# Patient Record
Sex: Female | Born: 1950
Health system: Southern US, Community
[De-identification: ages and names within clinical notes are randomized; demographics above are authoritative.]

## PROBLEM LIST (undated history)

## (undated) DIAGNOSIS — N189 Chronic kidney disease, unspecified: Secondary | ICD-10-CM

## (undated) DIAGNOSIS — E049 Nontoxic goiter, unspecified: Secondary | ICD-10-CM

## (undated) DIAGNOSIS — C50919 Malignant neoplasm of unspecified site of unspecified female breast: Secondary | ICD-10-CM

## (undated) DIAGNOSIS — I480 Paroxysmal atrial fibrillation: Secondary | ICD-10-CM

## (undated) DIAGNOSIS — R0602 Shortness of breath: Secondary | ICD-10-CM

## (undated) DIAGNOSIS — I428 Other cardiomyopathies: Secondary | ICD-10-CM

## (undated) DIAGNOSIS — I10 Essential (primary) hypertension: Secondary | ICD-10-CM

## (undated) DIAGNOSIS — I5022 Chronic systolic (congestive) heart failure: Secondary | ICD-10-CM

## (undated) DIAGNOSIS — F32A Depression, unspecified: Secondary | ICD-10-CM

## (undated) DIAGNOSIS — R55 Syncope and collapse: Secondary | ICD-10-CM

## (undated) DIAGNOSIS — F419 Anxiety disorder, unspecified: Secondary | ICD-10-CM

## (undated) DIAGNOSIS — Z95810 Presence of automatic (implantable) cardiac defibrillator: Secondary | ICD-10-CM

## (undated) DIAGNOSIS — I447 Left bundle-branch block, unspecified: Secondary | ICD-10-CM

## (undated) DIAGNOSIS — I427 Cardiomyopathy due to drug and external agent: Secondary | ICD-10-CM

## (undated) DIAGNOSIS — T451X5A Adverse effect of antineoplastic and immunosuppressive drugs, initial encounter: Secondary | ICD-10-CM

## (undated) DIAGNOSIS — Z9889 Other specified postprocedural states: Secondary | ICD-10-CM

## (undated) DIAGNOSIS — I509 Heart failure, unspecified: Secondary | ICD-10-CM

## (undated) DIAGNOSIS — I2510 Atherosclerotic heart disease of native coronary artery without angina pectoris: Secondary | ICD-10-CM

## (undated) DIAGNOSIS — M1990 Unspecified osteoarthritis, unspecified site: Secondary | ICD-10-CM

## (undated) DIAGNOSIS — I209 Angina pectoris, unspecified: Secondary | ICD-10-CM

## (undated) DIAGNOSIS — R112 Nausea with vomiting, unspecified: Secondary | ICD-10-CM

## (undated) DIAGNOSIS — Z950 Presence of cardiac pacemaker: Secondary | ICD-10-CM

## (undated) DIAGNOSIS — E119 Type 2 diabetes mellitus without complications: Secondary | ICD-10-CM

## (undated) DIAGNOSIS — I4891 Unspecified atrial fibrillation: Secondary | ICD-10-CM

## (undated) DIAGNOSIS — E785 Hyperlipidemia, unspecified: Secondary | ICD-10-CM

## (undated) DIAGNOSIS — F329 Major depressive disorder, single episode, unspecified: Secondary | ICD-10-CM

## (undated) HISTORY — DX: Syncope and collapse: R55

## (undated) HISTORY — PX: CARDIAC DEFIBRILLATOR PLACEMENT: SHX171

## (undated) HISTORY — DX: Cardiomyopathy due to drug and external agent: I42.7

## (undated) HISTORY — DX: Malignant neoplasm of unspecified site of unspecified female breast: C50.919

## (undated) HISTORY — DX: Chronic systolic (congestive) heart failure: I50.22

## (undated) HISTORY — PX: MASTECTOMY: SHX3

## (undated) HISTORY — DX: Other cardiomyopathies: I42.8

## (undated) HISTORY — PX: BREAST SURGERY: SHX581

## (undated) HISTORY — DX: Adverse effect of antineoplastic and immunosuppressive drugs, initial encounter: T45.1X5A

## (undated) HISTORY — PX: TUNNELED VENOUS CATHETER PLACEMENT: SHX818

## (undated) HISTORY — DX: Left bundle-branch block, unspecified: I44.7

---
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
## 1973-07-11 HISTORY — PX: DILATION AND CURETTAGE OF UTERUS: SHX78

## 1989-07-11 HISTORY — PX: PORTACATH PLACEMENT: SHX2246

## 1992-07-11 HISTORY — PX: FOOT SURGERY: SHX648

## 1997-12-01 ENCOUNTER — Ambulatory Visit (HOSPITAL_COMMUNITY): Admission: RE | Admit: 1997-12-01 | Discharge: 1997-12-01 | Payer: Self-pay | Admitting: Oncology

## 1998-11-24 ENCOUNTER — Ambulatory Visit (HOSPITAL_COMMUNITY): Admission: RE | Admit: 1998-11-24 | Discharge: 1998-11-24 | Payer: Self-pay | Admitting: Oncology

## 2000-04-14 ENCOUNTER — Other Ambulatory Visit: Admission: RE | Admit: 2000-04-14 | Discharge: 2000-04-14 | Payer: Self-pay | Admitting: Internal Medicine

## 2000-06-15 ENCOUNTER — Encounter (HOSPITAL_COMMUNITY): Payer: Self-pay | Admitting: Oncology

## 2000-06-15 ENCOUNTER — Encounter: Admission: RE | Admit: 2000-06-15 | Discharge: 2000-06-15 | Payer: Self-pay | Admitting: Oncology

## 2001-12-10 ENCOUNTER — Ambulatory Visit (HOSPITAL_COMMUNITY): Admission: RE | Admit: 2001-12-10 | Discharge: 2001-12-10 | Payer: Self-pay | Admitting: Oncology

## 2001-12-10 ENCOUNTER — Encounter (HOSPITAL_COMMUNITY): Payer: Self-pay | Admitting: Oncology

## 2002-02-25 ENCOUNTER — Other Ambulatory Visit: Admission: RE | Admit: 2002-02-25 | Discharge: 2002-02-25 | Payer: Self-pay | Admitting: Internal Medicine

## 2004-02-13 ENCOUNTER — Encounter: Admission: RE | Admit: 2004-02-13 | Discharge: 2004-02-13 | Payer: Self-pay | Admitting: Cardiology

## 2004-12-02 ENCOUNTER — Ambulatory Visit: Payer: Self-pay | Admitting: Oncology

## 2005-06-28 ENCOUNTER — Other Ambulatory Visit: Admission: RE | Admit: 2005-06-28 | Discharge: 2005-06-28 | Payer: Self-pay | Admitting: Internal Medicine

## 2005-11-09 ENCOUNTER — Encounter (HOSPITAL_COMMUNITY): Payer: Self-pay | Admitting: Oncology

## 2005-11-29 ENCOUNTER — Ambulatory Visit: Payer: Self-pay | Admitting: Oncology

## 2005-12-29 ENCOUNTER — Encounter: Admission: RE | Admit: 2005-12-29 | Discharge: 2005-12-29 | Payer: Self-pay | Admitting: Oncology

## 2006-08-21 ENCOUNTER — Encounter: Admission: RE | Admit: 2006-08-21 | Discharge: 2006-08-21 | Payer: Self-pay | Admitting: Cardiology

## 2006-11-24 ENCOUNTER — Ambulatory Visit: Payer: Self-pay | Admitting: Oncology

## 2006-11-28 LAB — LACTATE DEHYDROGENASE: LDH: 203 U/L (ref 94–250)

## 2006-11-28 LAB — COMPREHENSIVE METABOLIC PANEL
AST: 39 U/L — ABNORMAL HIGH (ref 0–37)
Alkaline Phosphatase: 65 U/L (ref 39–117)
BUN: 27 mg/dL — ABNORMAL HIGH (ref 6–23)
Chloride: 97 mEq/L (ref 96–112)
Glucose, Bld: 171 mg/dL — ABNORMAL HIGH (ref 70–99)
Sodium: 142 mEq/L (ref 135–145)
Total Bilirubin: 0.7 mg/dL (ref 0.3–1.2)

## 2006-11-28 LAB — CBC WITH DIFFERENTIAL/PLATELET
BASO%: 0.3 % (ref 0.0–2.0)
EOS%: 2.7 % (ref 0.0–7.0)
HGB: 12.6 g/dL (ref 11.6–15.9)
MCV: 92.9 fL (ref 81.0–101.0)
MONO#: 0.5 10*3/uL (ref 0.1–0.9)
NEUT%: 72.1 % (ref 39.6–76.8)

## 2006-11-28 LAB — HEMOGLOBIN A1C: Hgb A1c MFr Bld: 9.2 % — ABNORMAL HIGH (ref 4.6–6.1)

## 2007-01-11 ENCOUNTER — Encounter: Admission: RE | Admit: 2007-01-11 | Discharge: 2007-01-11 | Payer: Self-pay | Admitting: Oncology

## 2007-11-23 ENCOUNTER — Ambulatory Visit: Payer: Self-pay | Admitting: Oncology

## 2007-12-31 ENCOUNTER — Ambulatory Visit: Payer: Self-pay | Admitting: Internal Medicine

## 2008-01-30 ENCOUNTER — Encounter: Admission: RE | Admit: 2008-01-30 | Discharge: 2008-01-30 | Payer: Self-pay | Admitting: Oncology

## 2008-11-25 ENCOUNTER — Ambulatory Visit: Payer: Self-pay | Admitting: Oncology

## 2010-02-04 ENCOUNTER — Encounter: Admission: RE | Admit: 2010-02-04 | Discharge: 2010-02-04 | Payer: Self-pay | Admitting: Internal Medicine

## 2010-02-16 ENCOUNTER — Encounter: Admission: RE | Admit: 2010-02-16 | Discharge: 2010-02-16 | Payer: Self-pay | Admitting: Internal Medicine

## 2010-02-16 ENCOUNTER — Other Ambulatory Visit: Admission: RE | Admit: 2010-02-16 | Discharge: 2010-02-16 | Payer: Self-pay | Admitting: Interventional Radiology

## 2010-08-02 ENCOUNTER — Encounter: Payer: Self-pay | Admitting: Orthopaedic Surgery

## 2010-08-02 ENCOUNTER — Encounter: Payer: Self-pay | Admitting: Internal Medicine

## 2010-11-12 ENCOUNTER — Encounter: Payer: Self-pay | Admitting: Internal Medicine

## 2010-11-23 NOTE — Letter (Signed)
December 31, 2007    Sara Walker. Sara Walker, M.D.  [address], [place] [address]   RE:  Sara, Walker  MRN:  [id number]  /  DOB:  1950/10/17   Dear Sara Walker,   Thank you for referring [name] over for consideration for  prophylactic ICD implantation.  As you know she is a very pleasant 60-  year-old morbidly obese woman with a longstanding dilated cardiomyopathy  secondary to complications from chemotherapy.  The patient has had LV  dysfunction and an EF of 10-20% for many many years.  She also has left  bundle-branch block.  She has class III congestive heart failure, though  with careful adjustments in her medications has managed not to be  hospitalized in the recent past.  She has never had frank syncope,  although she does note that she has fallen twice unexpectedly for no  reason in the last month.  She denies frank syncope.  She denies  palpitations.  She denies sensation that her heart was racing prior to  her episodes of falling.  She denies for medical or dietary  noncompliance.  She does note some chronic swelling in her lower  extremities.   CURRENT MEDICATIONS:  1. Aldactone 50-100 mg daily.  2. Coreg CR 80 mg a day.  3. Torsemide half tablet daily.  4. Diclofenac twice daily.  5. Crestor 5 mg every other day.  6. Lantus insulin.  7. Multivitamin.   ADDITIONAL PAST MEDICAL HISTORY:  Notable for mastectomy and C-section  in 1976 and 1983.  She has a history of Port-A-Cath placed in 1999 with  removal in 1994.  She has a history of problems with arthritis in her  feet and ankles and is status post surgery on her foot and ankle in the  past.  She has longstanding diabetes.  She has obesity.   FAMILY HISTORY:  Notable for mother still alive at age 59 with dementia  and father who is deceased at 18 with a myocardial infarction.  She has  a brother who is living, who is 37.   SOCIAL HISTORY:  The patient lives with her mother and cares for her.  She  denies tobacco use.  Starts smoking in 1989.  She does not drink  alcoholic beverages or use recreational drugs.   REVIEW OF SYSTEMS:  Notable for generalized fatigue and weakness.  Her  heart failure symptoms are class III.  She has a history of asthma and  seasonal allergic rhinitis.  She has a history of chronic anemia.  She  has a history of gout, which was exacerbated by HCTZ.  She has a history  of cellulitis in her right arm in the past.  She has history of  dysmenorrhea.  The rest of her review of systems was negative.   PHYSICAL EXAMINATION:  GENERAL:  She is pleasant, obese, middle-aged  woman, in no acute distress.  VITAL SIGNS:  The blood pressure was 121/74, the pulse was 75 and  regular, respirations were 18, and weight was 298 pounds.  HEENT:  Normocephalic and atraumatic.  Pupils were equal and round.  Oropharynx was moist.  Sclerae anicteric.  NECK:  Revealed 7-8 cm jugular distention.  No thyromegaly.  Trachea was  midline.  Carotids were 2+ and symmetric.  LUNGS:  Clear bilaterally to auscultation.  No wheezes, rales, or  rhonchi were present.  There were no increased work of breathing.  CARDIAC:  Regular rate and rhythm with  normal S1 and S2.  The PMI was  enlarged and laterally displaced.  ABDOMEN:  Massively obese, nontender, and nondistended.  There was no  organomegaly.  EXTREMITIES:  Trace peripheral edema bilaterally also morbidly obese.  NEUROLOGIC:  Alert and oriented x3.  Her cranial nerves intact.  Strength was 5/5 and symmetric.   EKG demonstrates sinus rhythm with left bundle-branch block.   Sara Walker, the patient has several problems including dilated cardiomyopathy  with congestive heart failure and left bundle-branch block and morbid  obesity.  I have discussed treatment options  with the patient.  The risks, benefits, goals, and expectations of ICD  implantation have been discussed with her.  I have recommended  proceeding with defibrillator implant.   She is considering her options  and she will call us if she would like to proceed with this and the  earliest possible convenient time.  Thanks again for referring [name] for EP evaluation.    Sincerely,      Champ Mungo. Lovena Le, MD  Electronically Signed    GWT/MedQ  DD: 12/31/2007  DT: 01/01/2008  Job #: [id number]

## 2011-02-28 ENCOUNTER — Ambulatory Visit
Admission: RE | Admit: 2011-02-28 | Discharge: 2011-02-28 | Disposition: A | Discharge status: Home / Self Care | Payer: Medicare Other | Source: Ambulatory Visit | Attending: Internal Medicine | Admitting: Internal Medicine

## 2011-02-28 ENCOUNTER — Other Ambulatory Visit: Payer: Self-pay | Admitting: Internal Medicine

## 2011-02-28 DIAGNOSIS — E049 Nontoxic goiter, unspecified: Secondary | ICD-10-CM

## 2011-05-09 ENCOUNTER — Encounter: Payer: Self-pay | Admitting: Internal Medicine

## 2011-07-20 ENCOUNTER — Other Ambulatory Visit: Payer: Self-pay | Admitting: Endocrinology

## 2011-07-20 DIAGNOSIS — E049 Nontoxic goiter, unspecified: Secondary | ICD-10-CM

## 2011-07-22 ENCOUNTER — Other Ambulatory Visit: Payer: Self-pay

## 2011-07-22 ENCOUNTER — Telehealth: Payer: Self-pay

## 2011-07-22 DIAGNOSIS — C50919 Malignant neoplasm of unspecified site of unspecified female breast: Secondary | ICD-10-CM

## 2011-07-22 NOTE — Telephone Encounter (Signed)
Pt states this appt is for checkup b/c her PCP asked when was the last time she saw an oncologist. No immediate concerns voiced. I told her the schedulers would be calling her with an appt probably in March. She voiced understanding.

## 2011-07-25 ENCOUNTER — Other Ambulatory Visit: Payer: Medicare Other

## 2011-08-09 DIAGNOSIS — M109 Gout, unspecified: Secondary | ICD-10-CM | POA: Diagnosis not present

## 2011-09-12 DIAGNOSIS — L600 Ingrowing nail: Secondary | ICD-10-CM | POA: Diagnosis not present

## 2011-09-12 DIAGNOSIS — M2040 Other hammer toe(s) (acquired), unspecified foot: Secondary | ICD-10-CM | POA: Diagnosis not present

## 2011-09-12 DIAGNOSIS — M2010 Hallux valgus (acquired), unspecified foot: Secondary | ICD-10-CM | POA: Diagnosis not present

## 2011-09-12 DIAGNOSIS — B351 Tinea unguium: Secondary | ICD-10-CM | POA: Diagnosis not present

## 2011-09-12 DIAGNOSIS — E1169 Type 2 diabetes mellitus with other specified complication: Secondary | ICD-10-CM | POA: Diagnosis not present

## 2011-09-22 DIAGNOSIS — E7800 Pure hypercholesterolemia, unspecified: Secondary | ICD-10-CM | POA: Diagnosis not present

## 2011-09-22 DIAGNOSIS — E119 Type 2 diabetes mellitus without complications: Secondary | ICD-10-CM | POA: Diagnosis not present

## 2011-10-11 ENCOUNTER — Encounter: Payer: Self-pay | Admitting: Internal Medicine

## 2011-10-11 ENCOUNTER — Encounter (HOSPITAL_COMMUNITY): Payer: Self-pay | Admitting: Internal Medicine

## 2011-10-11 ENCOUNTER — Inpatient Hospital Stay (HOSPITAL_COMMUNITY)
Admission: AD | Admit: 2011-10-11 | Discharge: 2011-10-14 | DRG: 292 | Disposition: A | Discharge status: Home / Self Care | Payer: Medicare Other | Source: Ambulatory Visit | Attending: Internal Medicine | Admitting: Internal Medicine

## 2011-10-11 DIAGNOSIS — Z79899 Other long term (current) drug therapy: Secondary | ICD-10-CM

## 2011-10-11 DIAGNOSIS — E119 Type 2 diabetes mellitus without complications: Secondary | ICD-10-CM | POA: Diagnosis present

## 2011-10-11 DIAGNOSIS — Z853 Personal history of malignant neoplasm of breast: Secondary | ICD-10-CM | POA: Diagnosis not present

## 2011-10-11 DIAGNOSIS — E1169 Type 2 diabetes mellitus with other specified complication: Secondary | ICD-10-CM | POA: Diagnosis present

## 2011-10-11 DIAGNOSIS — R609 Edema, unspecified: Secondary | ICD-10-CM | POA: Diagnosis not present

## 2011-10-11 DIAGNOSIS — I5022 Chronic systolic (congestive) heart failure: Secondary | ICD-10-CM | POA: Diagnosis not present

## 2011-10-11 DIAGNOSIS — I10 Essential (primary) hypertension: Secondary | ICD-10-CM | POA: Diagnosis not present

## 2011-10-11 DIAGNOSIS — Z6831 Body mass index (BMI) 31.0-31.9, adult: Secondary | ICD-10-CM

## 2011-10-11 DIAGNOSIS — M109 Gout, unspecified: Secondary | ICD-10-CM | POA: Insufficient documentation

## 2011-10-11 DIAGNOSIS — I428 Other cardiomyopathies: Secondary | ICD-10-CM | POA: Diagnosis not present

## 2011-10-11 DIAGNOSIS — T451X5A Adverse effect of antineoplastic and immunosuppressive drugs, initial encounter: Secondary | ICD-10-CM | POA: Diagnosis present

## 2011-10-11 DIAGNOSIS — I2589 Other forms of chronic ischemic heart disease: Secondary | ICD-10-CM | POA: Diagnosis not present

## 2011-10-11 DIAGNOSIS — E785 Hyperlipidemia, unspecified: Secondary | ICD-10-CM | POA: Diagnosis present

## 2011-10-11 DIAGNOSIS — E049 Nontoxic goiter, unspecified: Secondary | ICD-10-CM | POA: Insufficient documentation

## 2011-10-11 DIAGNOSIS — I509 Heart failure, unspecified: Secondary | ICD-10-CM | POA: Diagnosis present

## 2011-10-11 DIAGNOSIS — I5023 Acute on chronic systolic (congestive) heart failure: Principal | ICD-10-CM | POA: Diagnosis present

## 2011-10-11 DIAGNOSIS — C50919 Malignant neoplasm of unspecified site of unspecified female breast: Secondary | ICD-10-CM | POA: Insufficient documentation

## 2011-10-11 DIAGNOSIS — R0602 Shortness of breath: Secondary | ICD-10-CM | POA: Diagnosis not present

## 2011-10-11 DIAGNOSIS — E782 Mixed hyperlipidemia: Secondary | ICD-10-CM | POA: Diagnosis not present

## 2011-10-11 DIAGNOSIS — I447 Left bundle-branch block, unspecified: Secondary | ICD-10-CM | POA: Diagnosis present

## 2011-10-11 DIAGNOSIS — E876 Hypokalemia: Secondary | ICD-10-CM | POA: Diagnosis present

## 2011-10-11 DIAGNOSIS — E7800 Pure hypercholesterolemia, unspecified: Secondary | ICD-10-CM | POA: Diagnosis present

## 2011-10-11 DIAGNOSIS — E669 Obesity, unspecified: Secondary | ICD-10-CM | POA: Diagnosis present

## 2011-10-11 HISTORY — DX: Malignant neoplasm of unspecified site of unspecified female breast: C50.919

## 2011-10-11 HISTORY — DX: Type 2 diabetes mellitus without complications: E11.9

## 2011-10-11 HISTORY — DX: Nontoxic goiter, unspecified: E04.9

## 2011-10-11 HISTORY — DX: Hyperlipidemia, unspecified: E78.5

## 2011-10-11 HISTORY — DX: Essential (primary) hypertension: I10

## 2011-10-11 LAB — DIFFERENTIAL
Basophils Absolute: 0 10*3/uL (ref 0.0–0.1)
Basophils Relative: 0 % (ref 0–1)
Eosinophils Absolute: 0.1 10*3/uL (ref 0.0–0.7)
Monocytes Relative: 8 % (ref 3–12)
Neutrophils Relative %: 69 % (ref 43–77)

## 2011-10-11 LAB — CBC
Hemoglobin: 13.6 g/dL (ref 12.0–15.0)
MCH: 30.2 pg (ref 26.0–34.0)
MCHC: 33.1 g/dL (ref 30.0–36.0)
RDW: 15 % (ref 11.5–15.5)

## 2011-10-11 LAB — COMPREHENSIVE METABOLIC PANEL
Albumin: 3.9 g/dL (ref 3.5–5.2)
Alkaline Phosphatase: 103 U/L (ref 39–117)
BUN: 20 mg/dL (ref 6–23)
Creatinine, Ser: 0.97 mg/dL (ref 0.50–1.10)
Potassium: 3.8 mEq/L (ref 3.5–5.1)
Total Protein: 6.4 g/dL (ref 6.0–8.3)

## 2011-10-11 LAB — PROTIME-INR
INR: 1.26 (ref 0.00–1.49)
Prothrombin Time: 16.1 seconds — ABNORMAL HIGH (ref 11.6–15.2)

## 2011-10-11 LAB — GLUCOSE, CAPILLARY

## 2011-10-11 LAB — TSH: TSH: 3.192 u[IU]/mL (ref 0.350–4.500)

## 2011-10-11 LAB — MAGNESIUM: Magnesium: 2.1 mg/dL (ref 1.5–2.5)

## 2011-10-11 MED ORDER — SODIUM CHLORIDE 0.9 % IJ SOLN
3.0000 mL | Freq: Two times a day (BID) | INTRAMUSCULAR | Status: DC
  Administered 2011-10-12 – 2011-10-14 (×4): 3 mL via INTRAVENOUS

## 2011-10-11 MED ORDER — CARVEDILOL PHOSPHATE ER 40 MG PO CP24
40.0000 mg | ORAL_CAPSULE | Freq: Every day | ORAL | Status: DC
  Administered 2011-10-12 – 2011-10-14 (×3): 40 mg via ORAL
  Filled 2011-10-11 (×3): qty 1

## 2011-10-11 MED ORDER — ONDANSETRON HCL 4 MG/2ML IJ SOLN: 4.0000 mg | Freq: Four times a day (QID) | INTRAMUSCULAR | Status: DC | PRN

## 2011-10-11 MED ORDER — SODIUM CHLORIDE 0.9 % IV SOLN: 250.0000 mL | INTRAVENOUS | Status: DC | PRN

## 2011-10-11 MED ORDER — SODIUM CHLORIDE 0.9 % IJ SOLN: 3.0000 mL | INTRAMUSCULAR | Status: DC | PRN

## 2011-10-11 MED ORDER — HEPARIN SODIUM (PORCINE) 5000 UNIT/ML IJ SOLN
5000.0000 [IU] | Freq: Three times a day (TID) | INTRAMUSCULAR | Status: DC
  Administered 2011-10-11 – 2011-10-12 (×3): 5000 [IU] via SUBCUTANEOUS
  Filled 2011-10-11 (×12): qty 1

## 2011-10-11 MED ORDER — ADULT MULTIVITAMIN W/MINERALS CH
1.0000 | ORAL_TABLET | Freq: Every day | ORAL | Status: DC
  Administered 2011-10-12 – 2011-10-14 (×3): 1 via ORAL
  Filled 2011-10-11 (×3): qty 1

## 2011-10-11 MED ORDER — ZOLPIDEM TARTRATE 5 MG PO TABS: 10.0000 mg | ORAL_TABLET | Freq: Every evening | ORAL | Status: DC | PRN

## 2011-10-11 MED ORDER — ACETAMINOPHEN 325 MG PO TABS
650.0000 mg | ORAL_TABLET | ORAL | Status: DC | PRN
  Filled 2011-10-11: qty 2

## 2011-10-11 MED ORDER — PANTOPRAZOLE SODIUM 40 MG PO TBEC
40.0000 mg | DELAYED_RELEASE_TABLET | Freq: Every day | ORAL | Status: DC
  Administered 2011-10-12 – 2011-10-14 (×2): 40 mg via ORAL
  Filled 2011-10-11 (×2): qty 1

## 2011-10-11 MED ORDER — FUROSEMIDE 10 MG/ML IJ SOLN
60.0000 mg | Freq: Two times a day (BID) | INTRAMUSCULAR | Status: DC
  Filled 2011-10-11 (×2): qty 6

## 2011-10-11 MED ORDER — ALLOPURINOL 100 MG PO TABS
100.0000 mg | ORAL_TABLET | Freq: Two times a day (BID) | ORAL | Status: DC
  Administered 2011-10-12 – 2011-10-14 (×5): 100 mg via ORAL
  Filled 2011-10-11 (×7): qty 1

## 2011-10-11 MED ORDER — BUMETANIDE 0.25 MG/ML IJ SOLN
1.0000 mg | Freq: Two times a day (BID) | INTRAMUSCULAR | Status: DC
  Administered 2011-10-11 – 2011-10-12 (×2): 1 mg via INTRAVENOUS
  Filled 2011-10-11 (×3): qty 4

## 2011-10-11 MED ORDER — SPIRONOLACTONE 25 MG PO TABS
25.0000 mg | ORAL_TABLET | Freq: Every day | ORAL | Status: DC
  Administered 2011-10-12 – 2011-10-14 (×3): 25 mg via ORAL
  Filled 2011-10-11 (×3): qty 1

## 2011-10-11 MED ORDER — ALPRAZOLAM 0.25 MG PO TABS: 0.2500 mg | ORAL_TABLET | Freq: Three times a day (TID) | ORAL | Status: DC | PRN

## 2011-10-11 MED ORDER — THERA M PLUS PO TABS: 1.0000 | ORAL_TABLET | Freq: Every day | ORAL | Status: DC

## 2011-10-11 NOTE — Progress Notes (Signed)
Consent obtained for PICC line. Able to obtain PIV. Patient asks to wait until AM after MD rounds to have PICC placed. Primary RN aware. No further actions taken at this time. Edson Snowball RN [facility]

## 2011-10-11 NOTE — H&P (Signed)
[facility]    ADMISSION HISTORY & PHYSICAL   Chief Complaint:  Shortness of breath and weight gain  HPI:  This is a 61 y.o. female with a past medical history significant for invasive ductal breast cancer s/p adriamycin chemotherapy with cardiomyopathy and EF 25%.  She also has LBBB and a wide QRS of 170 msec. She has not been interested in Bi-V AICD in the past. She also has a history of morbid obesity with a weight of over 400 lbs.  - she started a vegan diet and recently weighed 165 lbs. In the office. She was seen today by Elliot Gault, PharmD at Dr. Pennie Banter office, who was concerned about her weight gain and referred her to [place]. In the office, she now weighs 214 lbs, appears extremely dyspneic with minimal exertion. Room air oxygen saturation is 88%. She cannot lie flat and has significant abdominal protuberance and lower extremity edema. I have recommended admission from the office for probable biventricular heart failure.   PMHx:  Past Medical History  Diagnosis Date  . Goiter   . DM2 (diabetes mellitus, type 2)   . Congestive heart failure (CHF)   . Invasive ductal carcinoma of breast   . Gout   . Hyperlipidemia   . Hypertension     No past surgical history on file.  FAMHx:  Family History  Problem Relation Age of Onset  . Heart attack Father   . Hypertension Brother     SOCHx:   reports that she has never smoked. She has never used smokeless tobacco. She reports that she does not drink alcohol or use illicit drugs.  ALLERGIES:  Allergies not on file  ROS: A comprehensive review of systems was negative except for: Constitutional: positive for anorexia, fatigue and weight gain Respiratory: positive for cough, dyspnea on exertion and wheezing Cardiovascular: positive for dyspnea, orthopnea and paroxysmal nocturnal dyspnea Genitourinary: positive for none Integument/breast: positive for warm, dry skin Musculoskeletal: positive for muscle  weakness Endocrine: positive for diabetes  HOME MEDS: No current facility-administered medications on file as of .   Medications Prior to Admission  Medication Sig Dispense Refill  . allopurinol (ZYLOPRIM) 100 MG tablet Take 100 mg by mouth daily.      . carvedilol (COREG CR) 40 MG 24 hr capsule Take 40 mg by mouth daily.      Marland Kitchen spironolactone (ALDACTONE) 25 MG tablet Take 25 mg by mouth daily.      Marland Kitchen torsemide (DEMADEX) 100 MG tablet Take 50 mg by mouth daily.        LABS/IMAGING: No results found for this or any previous visit (from the past 48 hour(s)). No results found.  VITALS: Office vitals: Weight 214 lbs (up from 165), BP 110/72, P 70, BMI 33, SPO2 88% on RA  EXAM: General appearance: alert and mild distress Neck: JVD - 4 cm above sternal notch, no adenopathy, no carotid bruit, supple, symmetrical, trachea midline and thyroid not enlarged, symmetric, no tenderness/mass/nodules Lungs: diminished breath sounds bilaterally and rales bibasilar Heart: S1, S2 normal, S3 present and systolic murmur: early systolic 2/6, crescendo at 2nd left intercostal space Abdomen: protuberant, mild fluid wave Extremities: 2-3+ pitting LE edema Pulses: 1+ symmetric Skin: dry, warm Neurologic: Mental status: Alert, oriented, thought content appropriate  IMPRESSION: 1. Probably biventricular heart failure.   PLAN: 1. Significant abdominal girth, weight gain, LE edema. Severe cardiomyopathy with ventricular dyssynchrony.  Will need diuresis +/- inotropes. Would be reasonable to start  IV diuresis today and consider right heart catheterization tomorrow to assess cardiac output and filling pressures. Last echo was in 2011 - will need to repeat this.   Pixie Casino, MD, [facility] Attending Cardiologist [facility] 10/11/2011, 3:44 PM

## 2011-10-11 NOTE — Progress Notes (Signed)
DIRECT ADMISSION FROM MD'S OFFICE FOR CHF, POOR VEIN  AND LIMITED VEIN ACCESS MD AWARE WITH ORDER FOR PICC LINE, IV TEAM AWARE.

## 2011-10-12 ENCOUNTER — Encounter: Payer: Self-pay | Admitting: Internal Medicine

## 2011-10-12 ENCOUNTER — Inpatient Hospital Stay (HOSPITAL_COMMUNITY): Payer: Medicare Other

## 2011-10-12 DIAGNOSIS — R0602 Shortness of breath: Secondary | ICD-10-CM | POA: Diagnosis not present

## 2011-10-12 DIAGNOSIS — I428 Other cardiomyopathies: Secondary | ICD-10-CM | POA: Diagnosis not present

## 2011-10-12 DIAGNOSIS — I5023 Acute on chronic systolic (congestive) heart failure: Secondary | ICD-10-CM | POA: Diagnosis not present

## 2011-10-12 DIAGNOSIS — E119 Type 2 diabetes mellitus without complications: Secondary | ICD-10-CM | POA: Diagnosis not present

## 2011-10-12 DIAGNOSIS — I2589 Other forms of chronic ischemic heart disease: Secondary | ICD-10-CM | POA: Diagnosis not present

## 2011-10-12 DIAGNOSIS — I509 Heart failure, unspecified: Secondary | ICD-10-CM | POA: Diagnosis not present

## 2011-10-12 LAB — BASIC METABOLIC PANEL
Calcium: 9.1 mg/dL (ref 8.4–10.5)
Creatinine, Ser: 0.85 mg/dL (ref 0.50–1.10)
GFR calc Af Amer: 85 mL/min — ABNORMAL LOW (ref >90)
GFR calc non Af Amer: 73 mL/min — ABNORMAL LOW (ref >90)

## 2011-10-12 LAB — GLUCOSE, CAPILLARY: Glucose-Capillary: 115 mg/dL — ABNORMAL HIGH (ref 70–99)

## 2011-10-12 MED ORDER — POTASSIUM CHLORIDE CRYS ER 20 MEQ PO TBCR
40.0000 meq | EXTENDED_RELEASE_TABLET | Freq: Once | ORAL | Status: AC
  Administered 2011-10-12: 40 meq via ORAL
  Filled 2011-10-12: qty 2

## 2011-10-12 MED ORDER — BUMETANIDE 0.25 MG/ML IJ SOLN
1.0000 mg | INTRAMUSCULAR | Status: DC
  Administered 2011-10-12: 1 mg via INTRAVENOUS
  Filled 2011-10-12 (×3): qty 4

## 2011-10-12 NOTE — Clinical Documentation Improvement (Signed)
CHF DOCUMENTATION CLARIFICATION QUERY  THIS DOCUMENT IS NOT A PERMANENT PART OF THE MEDICAL RECORD  TO RESPOND TO THE THIS QUERY, FOLLOW THE INSTRUCTIONS BELOW:  1. If needed, update documentation for the patient's encounter via the notes activity.  2. Access this query again and click edit on the [facility] [employer].  3. After updating, or not, click F2 to complete all highlighted (required) fields concerning your review. Select "additional documentation in the medical record" OR "no additional documentation provided".  4. Click Sign note button.  5. The deficiency will fall out of your In Basket *Please let us know if you are not able to complete this workflow by phone or e-mail (listed below).  Please update your documentation within the medical record to reflect your response to this query.                                                                                    10/12/11  Dear Dr[facility],  In a better effort to capture your patient's severity of illness, reflect appropriate length of stay and utilization of resources, a review of the patient medical record has revealed the following indicators the diagnosis of Heart Failure.    Based on your clinical judgment, please clarify and document in a progress note and/or discharge summary the clinical condition associated with the following supporting information:  In responding to this query please exercise your independent judgment.  The fact that a query is asked, does not imply that any particular answer is desired or expected.  Possible Clinical Conditions?  Acute Systolic Congestive Heart Failure Acute Diastolic Congestive Heart Failure Acute Systolic & Diastolic Congestive Heart Failure Acute on Chronic Systolic Congestive Heart Failure Acute on Chronic Diastolic Congestive Heart Failure Acute on Chronic Systolic & Diastolic  Congestive Heart Failure Other Condition Cannot Clinically Determine  Supporting  Information:  Risk Factors: Noted probable biventricular heart failure per 10/12/11 progress notes. Please specify acute vs chronic in the progress notes and discharge summary.  Diagnostics: BNP results pending  Treatment: 4/02:  Bumex 1mg  IV q 12 hours 4/02:  Lasix 60mg  IV @ 2226 4/03:  Spironolactone 25mg  qday   Reviewed: additional documentation in the medical record  Thank You,  Theron Arista,  Clinical Documentation Specialist:  [phone]:   [place]

## 2011-10-12 NOTE — Progress Notes (Signed)
*  PRELIMINARY RESULTS* Echocardiogram 2D Echocardiogram has been performed.  Roxine Caddy [facility] 10/12/2011, 12:01 PM

## 2011-10-12 NOTE — Progress Notes (Signed)
This is a 61 y.o. female with a past medical history significant for invasive ductal breast cancer s/p adriamycin chemotherapy with cardiomyopathy and EF 25%. She also has LBBB and a wide QRS of 170 msec. She has not been interested in Bi-V AICD in the past. She also has a history of morbid obesity with a weight of over 400 lbs. - she started a vegan diet and recently weighed 165 lbs. In the office. She was seen today by Elliot Gault, PharmD at Dr. Pennie Banter office, who was concerned about her weight gain and referred her to [place]. In the office, she now weighs 214 lbs, appears extremely dyspneic with minimal exertion. Room air oxygen saturation is 88%. She cannot lie flat and has significant abdominal protuberance and lower extremity edema. I have recommended admission from the office for probable biventricular heart failure.     Subjective: No chest pain.  Breathing is better, edema still remains.  Objective: Vital signs in last 24 hours: Temp:  [97.3 F (36.3 C)-98.3 F (36.8 C)] 97.8 F (36.6 C) (04/03 0732) Pulse Rate:  [68-82] 69  (04/03 0732) Resp:  [15-21] 15  (04/03 0732) BP: (99-117)/(53-78) 103/72 mmHg (04/03 0732) SpO2:  [95 %-98 %] 97 % (04/03 0732) Weight:  [94 kg (207 lb 3.7 oz)] 94 kg (207 lb 3.7 oz) (04/02 1648) Weight change:  Last BM Date: 10/11/11 Intake/Output from previous day: -2780 04/02 0701 - 04/03 0700 In: 320 [P.O.:320] Out: 3900 [Urine:3900] Intake/Output this shift:    PE: General:alert and oriented. Feels dizzy because she hasn't eaten yet. Neck:while sitting up no JVD [id number] RRR, 2/6 systolic murmur Lungs:decreased breath sounds, no wheezes [id number], + BS, soft, non tender Ext:2-3+ Edema    Lab Results:  Basename 10/11/11 1655  WBC 6.3  HGB 13.6  HCT 41.1  PLT 195   BMET  Basename 10/12/11 0534 10/11/11 1655  NA 140 132*  K 3.4* 3.8  CL 101 96  CO2 27 22  GLUCOSE 113* 133*  BUN 18 20  CREATININE 0.85 0.97  CALCIUM 9.1 9.5   No  results found for this basename: TROPONINI:2,CK,MB:2 in the last 72 hours  No results found for this basename: CHOL, HDL, LDLCALC, LDLDIRECT, TRIG, CHOLHDL   Lab Results  Component Value Date   HGBA1C 9.2* 11/28/2006     Lab Results  Component Value Date   TSH 3.192 10/11/2011    Hepatic Function Panel  Basename 10/11/11 1655  PROT 6.4  ALBUMIN 3.9  AST 16  ALT 11  ALKPHOS 103  BILITOT 0.9  BILIDIR --  IBILI --   No results found for this basename: CHOL in the last 72 hours No results found for this basename: PROTIME in the last 72 hours    EKG: Orders placed during the hospital encounter of 10/11/11  . EKG 12-LEAD  . EKG 12-LEAD    Studies/Results: Dg Chest Port 1 View  10/12/2011  *RADIOLOGY REPORT*  Clinical Data: Shortness of breath, CHF  PORTABLE CHEST - 1 VIEW  Comparison: 09/29/2009; 12/10/2001; thyroid ultrasound - 01/15/2010; ultrasound guided thyroid biopsy - 02/16/2010  Findings:  Grossly unchanged enlarged cardiac silhouette. Increased leftward deviation of the tracheal air column with interval increase in right paratracheal mass.  Improved aeration of the bilateral upper lungs.  Pulmonary vasculature is more distinct on the present examination.  No definite pleural effusion or pneumothorax.  Right axillary surgical clips.  Grossly unchanged bones.  IMPRESSION: 1.  Increased leftward deviation of the tracheal air column  secondary to increased size of right paratracheal mass, possibly due to increase in size of previously biopsied thyroid mass. Further evaluation may be obtained with a chest CT as clinically indicated.  2.  No acute cardiopulmonary disease, specifically, no definite evidence of pulmonary edema.  These results will be called to the ordering clinician or representative by the Radiologist Assistant, and communication documented in the PACS Dashboard.  Original Report Authenticated By: Rachel Moulds, M.D.    Medications: I have reviewed the patient's  current medications.    Marland Kitchen allopurinol  100 mg Oral BID  . bumetanide (BUMEX) IV  1 mg Intravenous Q12H  . carvedilol  40 mg Oral Daily  . heparin  5,000 Units Subcutaneous Q8H  . mulitivitamin with minerals  1 tablet Oral Daily  . pantoprazole  40 mg Oral Q1200  . sodium chloride  3 mL Intravenous Q12H  . spironolactone  25 mg Oral Daily  . DISCONTD: furosemide  60 mg Intravenous BID  . DISCONTD: multivitamins ther. w/minerals  1 tablet Oral Daily   Assessment/Plan: Patient Active Problem List  Diagnoses  . Goiter  . DM2 (diabetes mellitus, type 2)  . Congestive heart failure (CHF)  . Invasive ductal carcinoma of breast  . Gout  . Hyperlipidemia  . Hypertension  . Doxorubicin adverse reaction  . LBBB (left bundle branch block)   PLAN: Hypokalemia-replace. Currently no follow up wt. Per RN down to 91 Kg. On chest x-ray:  Increased leftward deviation of the tracheal air column  secondary to increased size of right paratracheal mass, possibly  due to increase in size of previously biopsied thyroid mass.  Further evaluation may be obtained with a chest CT as clinically  indicated. On biopsy in 02/2011 -nonneoplastic goiter. Will change accucheck  To daily only.    LOS: 1 day   INGOLD,LAURA R 10/12/2011, 8:41 AM   I have seen and examined the patient along with [facility] R, NP.  I have reviewed the chart, notes and new data.  I agree with NP's note.  Key new complaints: dyspnea better, orthopnea resolved Key examination changes: JVP still 8-10 cm high and hyperpulsatile; 1-2 + ankle/pretibial edema Key new findings / data: improved hyponatremia after diuresis.  PLAN: One more day of IV diuretics, then switch back to PO. Cannot take furosemide. Did well w torsemide in past until this event. Asked her to reconsider BiVICD. Follo-up echo in office after dc. Target weight is probably around 180.   Give colchicine immediately at first sign of gout attack and dc home w  colchicine Rx prn gout attack.  Sanda Klein, MD, [place] [phone] 10/12/2011, 9:40 AM

## 2011-10-13 DIAGNOSIS — I509 Heart failure, unspecified: Secondary | ICD-10-CM | POA: Diagnosis not present

## 2011-10-13 DIAGNOSIS — I2589 Other forms of chronic ischemic heart disease: Secondary | ICD-10-CM | POA: Diagnosis not present

## 2011-10-13 LAB — BASIC METABOLIC PANEL
Chloride: 101 mEq/L (ref 96–112)
Creatinine, Ser: 0.92 mg/dL (ref 0.50–1.10)
GFR calc Af Amer: 77 mL/min — ABNORMAL LOW (ref >90)
Potassium: 4.5 mEq/L (ref 3.5–5.1)
Sodium: 136 mEq/L (ref 135–145)

## 2011-10-13 LAB — PRO B NATRIURETIC PEPTIDE: Pro B Natriuretic peptide (BNP): 4918 pg/mL — ABNORMAL HIGH (ref 0–125)

## 2011-10-13 MED ORDER — BUMETANIDE 0.25 MG/ML IJ SOLN
1.0000 mg | Freq: Two times a day (BID) | INTRAMUSCULAR | Status: DC
  Administered 2011-10-13: 1 mg via INTRAVENOUS
  Filled 2011-10-13 (×2): qty 4

## 2011-10-13 MED ORDER — BUMETANIDE 2 MG PO TABS
2.0000 mg | ORAL_TABLET | Freq: Two times a day (BID) | ORAL | Status: DC
  Filled 2011-10-13 (×3): qty 1

## 2011-10-13 MED ORDER — TORSEMIDE 100 MG PO TABS
100.0000 mg | ORAL_TABLET | Freq: Two times a day (BID) | ORAL | Status: DC
  Administered 2011-10-13 – 2011-10-14 (×2): 100 mg via ORAL
  Filled 2011-10-13 (×3): qty 1

## 2011-10-13 NOTE — Progress Notes (Signed)
   CARE MANAGEMENT NOTE 10/13/2011  Patient:  Sara Walker, Sara Walker   Account Number:  [id number]  Date Initiated:  10/13/2011  Documentation initiated by:  GRAVES-BIGELOW,Natavia Sublette  Subjective/Objective Assessment:   Pt admitted with SOB/ Weight gain.  Diuresing pt. Pt states she lives alone in [place] states she is ready to go home.     Action/Plan:   CM discussed possibility of [place] RN for CHF monitoring and pt has refused. Pt states she weighs daily and monitors her NA intake. She states she let it go to far. She was waiting on MD appointment.   Anticipated DC Date:  10/14/2011   Anticipated DC Plan:  [place]  CM consult      Choice offered to / List presented to:             Status of service:  Completed, signed off Medicare Important Message given?   (If response is "NO", the following Medicare IM given date fields will be blank) Date Medicare IM given:   Date Additional Medicare IM given:    Discharge Disposition:  HOME/SELF CARE  Per UR Regulation:    If discussed at Long Length of Stay Meetings, dates discussed:    Comments:  10-13-11 1230 Jacqlyn Krauss, RN,BSN [phone] No other needs from CM at this time.

## 2011-10-13 NOTE — Progress Notes (Signed)
Pt refused morning labs, CBG and heparin inj. Pt is educated about importance of these actions.

## 2011-10-13 NOTE — Progress Notes (Signed)
[facility] This is a 61 y.o. female with a past medical history significant for invasive ductal breast cancer s/p adriamycin chemotherapy with cardiomyopathy and EF 25%. She also has LBBB and a wide QRS of 170 msec. She has not been interested in Bi-V AICD in the past. She also has a history of morbid obesity with a weight of over 400 lbs. - she started a vegan diet and recently weighed 165 lbs. In the office. She was seen today by Elliot Gault, PharmD at Dr. Pennie Banter office, who was concerned about her weight gain and referred her to [place]. In the office, she now weighs 214 lbs, appears extremely dyspneic with minimal exertion. Room air oxygen saturation is 88%. She cannot lie flat and has significant abdominal protuberance and lower extremity edema. I have recommended admission from the office for probable biventricular heart failure.   Subjective: No complaints.  No SOB, CP, Orthopnea.  "Ready to go home"  Objective: Vital signs in last 24 hours: Temp:  [97.6 F (36.4 C)-98.4 F (36.9 C)] 98.4 F (36.9 C) (04/04 0811) Pulse Rate:  [64-80] 66  (04/04 0811) Resp:  [14-21] 18  (04/03 1600) BP: (91-120)/(53-79) 109/61 mmHg (04/04 0811) SpO2:  [93 %-100 %] 96 % (04/04 0811) Weight:  [91.7 kg (202 lb 2.6 oz)] 91.7 kg (202 lb 2.6 oz) (04/04 0400) Last BM Date: 10/12/11  Intake/Output from previous day: 04/03 0701 - 04/04 0700 In: 720 [P.O.:720] Out: 1750 [Urine:1750] Intake/Output this shift:    Medications Current Facility-Administered Medications  Medication Dose Route Frequency Provider Last Rate Last Dose  . 0.9 %  sodium chloride infusion  250 mL Intravenous PRN Pixie Casino, MD      . acetaminophen (TYLENOL) tablet 650 mg  650 mg Oral Q4H PRN Pixie Casino, MD      . allopurinol (ZYLOPRIM) tablet 100 mg  100 mg Oral BID Pixie Casino, MD   100 mg at 10/12/11 2200  . ALPRAZolam Duanne Moron) tablet 0.25 mg  0.25 mg Oral TID PRN Erlene Quan, PA      .  bumetanide (BUMEX) injection 1 mg  1 mg Intravenous Custom Pixie Casino, MD   1 mg at 10/12/11 1557  . carvedilol (COREG CR) 24 hr capsule 40 mg  40 mg Oral Daily Pixie Casino, MD   40 mg at 10/12/11 0958  . heparin injection 5,000 Units  5,000 Units Subcutaneous Q8H Pixie Casino, MD   5,000 Units at 10/12/11 2200  . mulitivitamin with minerals tablet 1 tablet  1 tablet Oral Daily Pixie Casino, MD   1 tablet at 10/12/11 0957  . ondansetron (ZOFRAN) injection 4 mg  4 mg Intravenous Q6H PRN Pixie Casino, MD      . pantoprazole (PROTONIX) EC tablet 40 mg  40 mg Oral Q1200 Erlene Quan, PA   40 mg at 10/12/11 1321  . potassium chloride SA (K-DUR,KLOR-CON) CR tablet 40 mEq  40 mEq Oral Once Cecilie Kicks, NP   40 mEq at 10/12/11 1435  . sodium chloride 0.9 % injection 3 mL  3 mL Intravenous Q12H Pixie Casino, MD   3 mL at 10/12/11 2200  . sodium chloride 0.9 % injection 3 mL  3 mL Intravenous PRN Pixie Casino, MD      . spironolactone (ALDACTONE) tablet 25 mg  25 mg Oral Daily Pixie Casino, MD   25 mg at 10/12/11 0957  . zolpidem (  AMBIEN) tablet 10 mg  10 mg Oral QHS PRN Erlene Quan, PA      . DISCONTD: bumetanide (BUMEX) injection 1 mg  1 mg Intravenous Q12H Doreene Burke [place], [place]   1 mg at 10/12/11 0957    PE: General appearance: alert, cooperative and no distress Neck: JVD Lungs: mild rales on the right base Heart: regular rate and rhythm Abdomen: +BS, nontender.   Extremities: 1-2+ LEE Pulses: Left radial 2+, 2+ DPs.  Lab Results:   [facility] 10/11/11 1655  WBC 6.3  HGB 13.6  HCT 41.1  PLT 195   BMET  Basename 10/12/11 0534 10/11/11 1655  NA 140 132*  K 3.4* 3.8  CL 101 96  CO2 27 22  GLUCOSE 113* 133*  BUN 18 20  CREATININE 0.85 0.97  CALCIUM 9.1 9.5   PT/INR  Basename 10/11/11 1655  LABPROT 16.1*  INR 1.26    Studies/Results: Study Conclusions  - Left ventricle: The cavity size was moderately dilated. Wall thickness was normal.  Systolic function was severely reduced. The estimated ejection fraction was in the range of 20% to 25%. Diffuse hypokinesis. Doppler parameters are consistent with restrictive physiology, indicative of decreased left ventricular diastolic compliance and/or increased left atrial pressure. Doppler parameters are consistent with elevated mean left atrial filling pressure. Cannot exclude apical thrombus. - Ventricular septum: Septal motion showed abnormal function, dyssynergy, and paradox. These changes are consistent with a left bundle branch block. - Mitral valve: Mild regurgitation. The acceleration rate of the regurgitant jet was reduced, consistent with a low dP/dt. - Left atrium: The atrium was severely dilated. - Right ventricle: Systolic function was mildly reduced. - Pulmonary arteries: Systolic pressure was mildly to moderately increased. PA peak pressure: 6mm Hg (S). - Pericardium, extracardiac: A trivial pericardial effusion was identified posterior to the heart.   Assessment/Plan  Principal Problem:  *Congestive heart failure (CHF) Active Problems:  DM2 (diabetes mellitus, type 2)  Hyperlipidemia  Hypertension  Doxorubicin adverse reaction  LBBB (left bundle branch block) Hypokalemia  Plan: The patient really wants to go home.  Breathing much better.  Net fluids negative 4.5L in the last two days.  SCr WNL.   Wt 202 lbs with target 180#.  EF 20-25% which is about the same as previous.   Can not take Lasix.  BMET and BNP just drawn.   Recommend she stays for further diuresis and make sure she responds to PO Bumex.  Changed to 2mg  PO BID.   LOS: 2 days    Walker Paddack W 10/13/2011 8:39 AM

## 2011-10-13 NOTE — Progress Notes (Addendum)
Pt. Seen and examined. Agree with the NP/PA-C note as written.  Diuresing well - net negative 4.5L since admission.  Weight is down, however, not near goal.  Recommend continued diuresis today. Await labs - she had refused labs this morning.  Will continue IV lasix today. She is not near goal weight. Possible switch to po bumex tomorrow. Watch for gout attack.  This is acute on chronic, congestive, systolic heart failure, NYHA class IV symptoms, EF 25%.  Pixie Casino, MD, [facility] Attending Cardiologist [facility] [place]

## 2011-10-14 DIAGNOSIS — I2589 Other forms of chronic ischemic heart disease: Secondary | ICD-10-CM | POA: Diagnosis not present

## 2011-10-14 DIAGNOSIS — I509 Heart failure, unspecified: Secondary | ICD-10-CM | POA: Diagnosis not present

## 2011-10-14 LAB — BASIC METABOLIC PANEL
Chloride: 99 mEq/L (ref 96–112)
Creatinine, Ser: 0.93 mg/dL (ref 0.50–1.10)
GFR calc Af Amer: 76 mL/min — ABNORMAL LOW (ref >90)
Potassium: 3.9 mEq/L (ref 3.5–5.1)

## 2011-10-14 MED ORDER — SPIRONOLACTONE 25 MG PO TABS: 25.0000 mg | ORAL_TABLET | Freq: Every day | ORAL | Status: DC

## 2011-10-14 MED ORDER — TORSEMIDE 20 MG PO TABS: 20.0000 mg | ORAL_TABLET | Freq: Two times a day (BID) | ORAL | Status: DC

## 2011-10-14 MED ORDER — TORSEMIDE 20 MG PO TABS: 100.0000 mg | ORAL_TABLET | Freq: Two times a day (BID) | ORAL | Status: DC

## 2011-10-14 NOTE — Progress Notes (Addendum)
Pt. Seen and examined. Agree with the NP/PA-C note as written.  Excellent diuresis again. BP somewhat soft. She has improved considerably and wants to go home. Switch to torsemide 70 mg po BID (home dose was 50 mg BID - would increase to 1/2 100 mg tab + 20 mg tab twice daily). [place] for discharge today. Will need outpatient BMP, BNP next week and follow-up within 7 days in our office with Dr. Loletha Grayer. Will need bi-ventricular pacing evaluation.  Pt. Should follow weight closely every day.  Pixie Casino, MD, [facility] Attending Cardiologist [facility] [place]

## 2011-10-14 NOTE — Progress Notes (Signed)
[facility] This is a 61 y.o. female with a past medical history significant for invasive ductal breast cancer s/p adriamycin chemotherapy with cardiomyopathy and EF 25%. She also has LBBB and a wide QRS of 170 msec. She has not been interested in Bi-V AICD in the past. She also has a history of morbid obesity with a weight of over 400 lbs. - she started a vegan diet and recently weighed 165 lbs. In the office. She was seen today by Elliot Gault, PharmD at Dr. Pennie Banter office, who was concerned about her weight gain and referred her to [place]. In the office, she now weighs 214 lbs, appears extremely dyspneic with minimal exertion. Room air oxygen saturation is 88%. She cannot lie flat and has significant abdominal protuberance and lower extremity edema. I have recommended admission from the office for probable biventricular heart failure. Subjective:   The patient had an episode of dizziness yesterday afternoon .  She bent over from a standing position and then stumbled forward.  She was able to catch herself prior to hitting the cart in the room but she did have to call for assistance.  Objective: Vital signs in last 24 hours: Temp:  [97.6 F (36.4 C)-99.2 F (37.3 C)] 97.6 F (36.4 C) (04/05 0800) Pulse Rate:  [63-71] 63  (04/05 0800) BP: (91-109)/(49-69) 104/49 mmHg (04/05 0800) SpO2:  [94 %-97 %] 95 % (04/05 0800) Weight:  [89.9 kg (198 lb 3.1 oz)-92.7 kg (204 lb 5.9 oz)] 89.9 kg (198 lb 3.1 oz) (04/05 0531) Last BM Date: 10/12/11  Intake/Output from previous day: 04/04 0701 - 04/05 0700 In: 1060 [P.O.:1060] Out: 3800 [Urine:3800] Intake/Output this shift:    Medications Current Facility-Administered Medications  Medication Dose Route Frequency Provider Last Rate Last Dose  . 0.9 %  sodium chloride infusion  250 mL Intravenous PRN Pixie Casino, MD      . acetaminophen (TYLENOL) tablet 650 mg  650 mg Oral Q4H PRN Pixie Casino, MD      . allopurinol  (ZYLOPRIM) tablet 100 mg  100 mg Oral BID Pixie Casino, MD   100 mg at 10/13/11 2057  . ALPRAZolam (XANAX) tablet 0.25 mg  0.25 mg Oral TID PRN Erlene Quan, PA      . carvedilol (COREG CR) 24 hr capsule 40 mg  40 mg Oral Daily Pixie Casino, MD   40 mg at 10/13/11 1013  . heparin injection 5,000 Units  5,000 Units Subcutaneous Q8H Pixie Casino, MD   5,000 Units at 10/12/11 2200  . mulitivitamin with minerals tablet 1 tablet  1 tablet Oral Daily Pixie Casino, MD   1 tablet at 10/13/11 1013  . ondansetron (ZOFRAN) injection 4 mg  4 mg Intravenous Q6H PRN Pixie Casino, MD      . pantoprazole (PROTONIX) EC tablet 40 mg  40 mg Oral Q1200 Erlene Quan, PA   40 mg at 10/12/11 1321  . sodium chloride 0.9 % injection 3 mL  3 mL Intravenous Q12H Pixie Casino, MD   3 mL at 10/13/11 1017  . sodium chloride 0.9 % injection 3 mL  3 mL Intravenous PRN Pixie Casino, MD      . spironolactone (ALDACTONE) tablet 25 mg  25 mg Oral Daily Pixie Casino, MD   25 mg at 10/13/11 1019  . torsemide (DEMADEX) tablet 100 mg  100 mg Oral BID Pixie Casino, MD   100 mg  at 10/13/11 1735  . zolpidem (AMBIEN) tablet 10 mg  10 mg Oral QHS PRN Erlene Quan, PA      . DISCONTD: bumetanide (BUMEX) injection 1 mg  1 mg Intravenous Custom Pixie Casino, MD   1 mg at 10/12/11 1557  . DISCONTD: bumetanide (BUMEX) injection 1 mg  1 mg Intravenous BID Pixie Casino, MD   1 mg at 10/13/11 1014  . DISCONTD: bumetanide (BUMEX) tablet 2 mg  2 mg Oral BID Brett Canales, PA        PE: General appearance: alert, cooperative and no distress Lungs: clear to auscultation bilaterally Heart: regular rate and rhythm, S1, S2 normal, no murmur, click, rub or gallop Abdomen: +BS  Extremities: 1+ LEE Pulses: Left radial 2+, 2+ DPs   Lab Results:   Basename 10/11/11 1655  WBC 6.3  HGB 13.6  HCT 41.1  PLT 195   BMET  Basename 10/14/11 0610 10/13/11 0906 10/12/11 0534  NA 138 136 140  K 3.9 4.5 3.4*   CL 99 101 101  CO2 27 24 27   GLUCOSE 112* 135* 113*  BUN 17 18 18   CREATININE 0.93 0.92 0.85  CALCIUM 9.7 9.7 9.1   PT/INR  Basename 10/11/11 1655  LABPROT 16.1*  INR 1.26     Assessment/Plan  Principal Problem:  *Congestive heart failure (CHF) Active Problems:  DM2 (diabetes mellitus, type 2)  Hyperlipidemia  Hypertension  Doxorubicin adverse reaction  LBBB (left bundle branch block)  Plan:  Diuresing well.  Net fluids: Negative 2613ml yesterday.  Now getting Torsemide 100mg  BID, and aldactone.  Will check orthostatic BPs now.  Continue to current treatment and monitor fluids/BP.   LOS: 3 days    Shonte Soderlund W 10/14/2011 8:43 AM

## 2011-10-17 NOTE — Discharge Summary (Signed)
Physician Discharge Summary  Patient ID: Sara Walker MRN: [id number] DOB/AGE: 01/18/1951 61 y.o.  Admit date: 10/11/2011 Discharge date: 10/17/2011  Admission Diagnoses:  Acute on Chronic systolic HF  Discharge Diagnoses:  Principal Problem:  *Congestive heart failure (CHF) Active Problems:  DM2 (diabetes mellitus, type 2)  Hyperlipidemia  Hypertension  Doxorubicin adverse reaction  LBBB (left bundle branch block)   Discharged Condition: stable  Hospital Course:   This is a 61 y.o. female with a past medical history significant for invasive ductal breast cancer s/p adriamycin chemotherapy with cardiomyopathy and EF 25%. She also has LBBB and a wide QRS of 170 msec. She has not been interested in Bi-V AICD in the past. She also has a history of morbid obesity with a weight of over 400 lbs. - she started a vegan diet and recently weighed 165 lbs. In the office. She was seen today by Elliot Gault, PharmD at Dr. Pennie Banter office, who was concerned about her weight gain and referred her to [place]. In the office, she weighed 214 lbs, appeared extremely dyspneic with minimal exertion. Room air oxygen saturation was 88%. She could not lie flat and had significant abdominal protuberance and lower extremity edema.  She was admitted for IV diuresis and did well with over 7L diuresed.  BNP was 4918.0.  DC weight was approximately 198#.  She was sent home on 70mg  BID torsemide.    She will get an OP BMET and BNP to make sure SCr/K+ is appropriate and BNP continues to trend down.  She was DCd in stable condition after being seen by Dr. Debara Pickett.    Consults: Case Manager  Significant Diagnostic Studies:  PORTABLE CHEST - 1 VIEW  Comparison: 09/29/2009; 12/10/2001; thyroid ultrasound -  01/15/2010; ultrasound guided thyroid biopsy - 02/16/2010  Findings:  Grossly unchanged enlarged cardiac silhouette. Increased leftward  deviation of the tracheal air column with interval increase in  right paratracheal mass.  Improved aeration of the bilateral upper  lungs. Pulmonary vasculature is more distinct on the present  examination. No definite pleural effusion or pneumothorax. Right  axillary surgical clips. Grossly unchanged bones.  IMPRESSION:  1. Increased leftward deviation of the tracheal air column  secondary to increased size of right paratracheal mass, possibly  due to increase in size of previously biopsied thyroid mass.  Further evaluation may be obtained with a chest CT as clinically  indicated.  2. No acute cardiopulmonary disease, specifically, no definite  evidence of pulmonary edema.  Study Conclusions  - Left ventricle: The cavity size was moderately dilated. Wall thickness was normal. Systolic function was severely reduced. The estimated ejection fraction was in the range of 20% to 25%. Diffuse hypokinesis. Doppler parameters are consistent with restrictive physiology, indicative of decreased left ventricular diastolic compliance and/or increased left atrial pressure. Doppler parameters are consistent with elevated mean left atrial filling pressure. Cannot exclude apical thrombus. - Ventricular septum: Septal motion showed abnormal function, dyssynergy, and paradox. These changes are consistent with a left bundle branch block. - Mitral valve: Mild regurgitation. The acceleration rate of the regurgitant jet was reduced, consistent with a low dP/dt. - Left atrium: The atrium was severely dilated. - Right ventricle: Systolic function was mildly reduced. - Pulmonary arteries: Systolic pressure was mildly to moderately increased. PA peak pressure: 46mm Hg (S). - Pericardium, extracardiac: A trivial pericardial effusion was identified posterior to the heart.  BMET    Component Value Date/Time   NA 138 10/14/2011 0610   K 3.9 10/14/2011  0610   CL 99 10/14/2011 0610   CO2 27 10/14/2011 0610   GLUCOSE 112* 10/14/2011 0610   BUN 17 10/14/2011 0610   CREATININE 0.93 10/14/2011 0610    CALCIUM 9.7 10/14/2011 0610   GFRNONAA 65* 10/14/2011 0610   GFRAA 76* 10/14/2011 0610    CBC    Component Value Date/Time   WBC 6.3 10/11/2011 1655   WBC 7.1 11/28/2006 1014   RBC 4.51 10/11/2011 1655   RBC 3.95 11/28/2006 1014   HGB 13.6 10/11/2011 1655   HGB 12.6 11/28/2006 1014   HCT 41.1 10/11/2011 1655   HCT 36.7 11/28/2006 1014   PLT 195 10/11/2011 1655   PLT 227 11/28/2006 1014   MCV 91.1 10/11/2011 1655   MCV 92.9 11/28/2006 1014   MCH 30.2 10/11/2011 1655   MCH 31.9 11/28/2006 1014   MCHC 33.1 10/11/2011 1655   MCHC 34.3 11/28/2006 1014   RDW 15.0 10/11/2011 1655   RDW 14.0 11/28/2006 1014   LYMPHSABS 1.3 10/11/2011 1655   LYMPHSABS 1.3 11/28/2006 1014   MONOABS 0.5 10/11/2011 1655   MONOABS 0.5 11/28/2006 1014   EOSABS 0.1 10/11/2011 1655   EOSABS 0.2 11/28/2006 1014   BASOSABS 0.0 10/11/2011 1655   BASOSABS 0.0 11/28/2006 1014   BNP 4918.0  Treatments: IV Bumex, PO torsemide.  Discharge Exam: Blood pressure 98/67, pulse 69, temperature 97.9 F (36.6 C), temperature source Oral, resp. rate 18, height 5\' 7"  (1.702 m), weight 89.9 kg (198 lb 3.1 oz), SpO2 96.00%.   Disposition: 01-Home or Self Care  Discharge Orders    Future Orders Please Complete By Expires   Diet - low sodium heart healthy      Increase activity slowly      Discharge instructions      Comments:   Daily AM weights.  Call our office if you gain 1-2# in a day or 4-5 in a week.  Labs next Tuesday     Medication List  As of 10/17/2011 11:21 AM   TAKE these medications         allopurinol 100 MG tablet   Commonly known as: ZYLOPRIM   Take 100 mg by mouth 2 (two) times daily.      carvedilol 40 MG 24 hr capsule   Commonly known as: COREG CR   Take 40 mg by mouth daily.      multivitamins ther. w/minerals Tabs   Take 1 tablet by mouth daily.      spironolactone 25 MG tablet   Commonly known as: ALDACTONE   Take 1 tablet (25 mg total) by mouth daily.      torsemide 20 MG tablet   Commonly known as: DEMADEX   Take 5  tablets (100 mg total) by mouth 2 (two) times daily. 100mg  tablets.  Take one-half tablet twice daily.      torsemide 20 MG tablet   Commonly known as: DEMADEX   Take 1 tablet (20 mg total) by mouth 2 (two) times daily.           Follow-up Information    Follow up with Sanda Klein, MD on 10/20/2011. ( 11:00 AM)    Contact information:   [address] [place] [phone]          Signed: Brett Canales 10/17/2011, 11:21 AM

## 2011-10-17 NOTE — Discharge Summary (Addendum)
Agree with D/C summary. ACE-I was withheld due to hypotension. Will revisit adding it as an outpatient if BP improves enough to tolerate it. She is currently on diuretic, aspirin, aldactone and b-blocker for her NICM.  She has refused AICD or Bi-V pacemaker.  Pixie Casino, MD, [facility] Attending Cardiologist [facility] [place]

## 2011-10-18 ENCOUNTER — Encounter: Payer: Self-pay | Admitting: Internal Medicine

## 2011-10-18 DIAGNOSIS — I509 Heart failure, unspecified: Secondary | ICD-10-CM | POA: Diagnosis not present

## 2011-10-18 DIAGNOSIS — R0602 Shortness of breath: Secondary | ICD-10-CM | POA: Diagnosis not present

## 2011-10-18 DIAGNOSIS — Z79899 Other long term (current) drug therapy: Secondary | ICD-10-CM | POA: Diagnosis not present

## 2011-10-20 ENCOUNTER — Encounter: Payer: Self-pay | Admitting: Internal Medicine

## 2011-10-20 DIAGNOSIS — I2589 Other forms of chronic ischemic heart disease: Secondary | ICD-10-CM | POA: Diagnosis not present

## 2011-10-20 DIAGNOSIS — I509 Heart failure, unspecified: Secondary | ICD-10-CM | POA: Diagnosis not present

## 2011-10-21 ENCOUNTER — Encounter: Payer: Self-pay | Admitting: Internal Medicine

## 2011-10-31 DIAGNOSIS — Z79899 Other long term (current) drug therapy: Secondary | ICD-10-CM | POA: Diagnosis not present

## 2011-11-10 ENCOUNTER — Ambulatory Visit (INDEPENDENT_AMBULATORY_CARE_PROVIDER_SITE_OTHER): Payer: Medicare Other | Admitting: Internal Medicine

## 2011-11-10 VITALS — BP 120/64 | HR 69 | Ht 67.0 in | Wt 183.0 lb

## 2011-11-10 DIAGNOSIS — I428 Other cardiomyopathies: Secondary | ICD-10-CM

## 2011-11-10 DIAGNOSIS — I447 Left bundle-branch block, unspecified: Secondary | ICD-10-CM

## 2011-11-10 DIAGNOSIS — I5042 Chronic combined systolic (congestive) and diastolic (congestive) heart failure: Secondary | ICD-10-CM | POA: Insufficient documentation

## 2011-11-10 DIAGNOSIS — I427 Cardiomyopathy due to drug and external agent: Secondary | ICD-10-CM

## 2011-11-10 DIAGNOSIS — I429 Cardiomyopathy, unspecified: Secondary | ICD-10-CM | POA: Diagnosis not present

## 2011-11-10 DIAGNOSIS — I5022 Chronic systolic (congestive) heart failure: Secondary | ICD-10-CM

## 2011-11-10 DIAGNOSIS — R55 Syncope and collapse: Secondary | ICD-10-CM | POA: Diagnosis not present

## 2011-11-10 DIAGNOSIS — T451X5A Adverse effect of antineoplastic and immunosuppressive drugs, initial encounter: Secondary | ICD-10-CM

## 2011-11-10 DIAGNOSIS — I5043 Acute on chronic combined systolic (congestive) and diastolic (congestive) heart failure: Secondary | ICD-10-CM | POA: Insufficient documentation

## 2011-11-10 NOTE — Assessment & Plan Note (Signed)
In the context of her NICM and conduction system disease this is quite worrisome

## 2011-11-10 NOTE — Assessment & Plan Note (Signed)
Pt has persistent cardiomyopathy complicated by CHF with progressive conduction system disease now with LBBB.  It is appropriate that she be considered for CRT-ICD   She was not interested in ICD in the more remote past b/c of depression and overall immobiltiy 2/2 morbid obesity and chronic pain  She is eager at this time  She was previously on ACE but not currently  This should be resumed. The other issue is whether stress testing to exclude CAD, while not likely, with her longstanding DM, certainly at risk.

## 2011-11-10 NOTE — Progress Notes (Signed)
History and Physical  Patient ID: Sara Walker MRN: [id number], SOB: 18-Jan-1951 61 y.o. Date of Encounter: 11/10/2011, 3:42 PM  Primary Physician: Pcp Not In System Primary Cardiologist: [facility] Primary Electrophysiologist:  sk  Chief Complaint: needs ICD  History of Present Illness: Sara Walker is a 61 y.o. female with history of cardiomyopathy first identified in the 90s and attributed chemotherapy  She has been on Coreg for years and ACE rx intermittently.  Some years ago she was referred for ICD but she was morbidly obese in chronic pain and depressed and shwe was not interested.  After the birth of her granddaughter, her life changed.  She became a vegan and lost 200+ lbs, but LV function did not improve.  diagetes did resolve  She was hospitalize d last month heart failure and underwent a 50 pound diuresis. She has persistent class 2B symptoms  In addition she long-standing conduction system disease. Of note is over the last 8 months he would right bundle-branch block to a nonspecific IVCD and now has left bundle-branch block. She has a history of remote syncope about 6 years ago that occurred without warning. She as had no subsequent syncope. Does have Orthostatic intolerance. Currently no edema    Past Medical History  Diagnosis Date  . Goiter   . DM2 (diabetes mellitus, type 2)   . Chronic systolic heart failure   . Invasive ductal carcinoma of breast   . Gout   . Hyperlipidemia   . Hypertension   . Breast cancer   . LBBB (left bundle branch block)     evidnece of RBBB fall 2012  . Cardiomyopathy secondary to chemotherapy     doxurubicin  . Syncope      Past Surgical History  Procedure Date  . Mastectomy     right  . Tunneled venous catheter placement     removed      Current Outpatient Prescriptions  Medication Sig Dispense Refill  . allopurinol (ZYLOPRIM) 100 MG tablet Take 100 mg by mouth 2 (two) times daily.       . carvedilol (COREG CR) 40 MG 24  hr capsule Take 40 mg by mouth daily.      . Multiple Vitamins-Minerals (MULTIVITAMINS THER. W/MINERALS) TABS Take 1 tablet by mouth daily.      Marland Kitchen spironolactone (ALDACTONE) 25 MG tablet Take 1 tablet (25 mg total) by mouth daily.  30 tablet  5  . torsemide (DEMADEX) 20 MG tablet Take 5 tablets (100 mg total) by mouth 2 (two) times daily. 100mg  tablets.  Take one-half tablet twice daily.  30 tablet  11  . torsemide (DEMADEX) 20 MG tablet Take 1 tablet (20 mg total) by mouth 2 (two) times daily.  60 tablet  11     Allergies: Allergies  Allergen Reactions  . Lasix (Furosemide)     Causes gout  . Codeine     Crazy  . Other     Nitro spray     History  Substance Use Topics  . Smoking status: Never Smoker   . Smokeless tobacco: Never Used  . Alcohol Use: No      Family History  Problem Relation Age of Onset  . Heart attack Father   . Hypertension Brother       ROS:  Please see the history of present illness.     All other systems reviewed and negative.   Vital Signs: Blood pressure 120/64, pulse 69, height 5\' 7"  (1.702 m), weight  183 lb (83.008 kg).  PHYSICAL EXAM: General:  Well nourished, well developed female in no acute distress  HEENT: normal Lymph: no adenopathy Neck: no JVD Endocrine:  No thryomegaly Chest: no kyphsis s/p right mastectomy Vascular: No carotid bruits; FA pulses 2+ bilaterally without bruits Cardiac:  normal S1, S2; RRR; 2/6 systolic murmur PMI displaced Back: without kyphosis/scoliosis, no CVA tenderness Lungs:  clear to auscultation bilaterally, no wheezing, rhonchi or rales Abd: soft, nontender, no hepatomegaly Ext: no edema Musculoskeletal:  No deformities, BUE and BLE strength normal and equal Skin: warm and dry Neuro:  CNs 2-12 intact, no focal abnormalities noted Psych:  Normal affect   EKG:  NSR with LBBB andm NW axis QRS 188 4/13  NSR with IVCD  QRS 160 10/12 NSR with RBBB  QRS 150    ASSESSMENT AND PLAN:

## 2011-11-10 NOTE — Assessment & Plan Note (Signed)
Impressive array of conduction system problems with RBBB and LBBB suggestive of severe disease and the clear indication for pacing

## 2011-11-10 NOTE — Assessment & Plan Note (Signed)
As above   She may well benefit from CRT implant at the time of ICD implant given broad LBBB, gender and NICM  She is aware of potential for nonresponse  Have reviewed the potential benefits and risks of ICD implantation including but not limited to death, perforation of heart or lung, lead dislodgement with or without diaghragmatic stimulation infection,  device malfunction and inappropriate shocks.  The patient expresses understanding  and are willing to proceed.

## 2011-11-14 DIAGNOSIS — M2040 Other hammer toe(s) (acquired), unspecified foot: Secondary | ICD-10-CM | POA: Diagnosis not present

## 2011-11-14 DIAGNOSIS — E1149 Type 2 diabetes mellitus with other diabetic neurological complication: Secondary | ICD-10-CM | POA: Diagnosis not present

## 2011-11-14 DIAGNOSIS — B351 Tinea unguium: Secondary | ICD-10-CM | POA: Diagnosis not present

## 2011-11-14 DIAGNOSIS — M2010 Hallux valgus (acquired), unspecified foot: Secondary | ICD-10-CM | POA: Diagnosis not present

## 2011-11-14 DIAGNOSIS — L600 Ingrowing nail: Secondary | ICD-10-CM | POA: Diagnosis not present

## 2011-11-15 ENCOUNTER — Telehealth: Payer: Self-pay | Admitting: Internal Medicine

## 2011-11-15 NOTE — Telephone Encounter (Signed)
Pt was to get an order for stress test to [facility] heart and vascular from dr Caryl Comes, as of today that have not heard from [place], pls call pt when order called over

## 2011-11-15 NOTE — Telephone Encounter (Signed)
Pt was notified that this message will be forwarded to Nira Conn, Dr Olin Pia nurse to find out if this was done.  Pt is aware that Nira Conn is out of the office until Thursday.

## 2011-11-17 NOTE — Telephone Encounter (Signed)
Order faxed for a myoview to [facility] at [phone]. I have notified the patient of this. She is aware. She will contact me when she is scheduled so I can know when to set her up for her Bi-V ICD.

## 2011-11-18 NOTE — Telephone Encounter (Signed)
I left a message for the patient to call if her Brantley Fling has been scheduled.

## 2011-11-28 ENCOUNTER — Institutional Professional Consult (permissible substitution): Payer: Medicare Other | Admitting: Internal Medicine

## 2011-11-29 DIAGNOSIS — M109 Gout, unspecified: Secondary | ICD-10-CM | POA: Diagnosis not present

## 2011-11-29 NOTE — Telephone Encounter (Signed)
The patient called last week and left a message on my voice mail that she has a myoview scheduled for 12/01/11 at [facility]. I will call for these results after 5/23 to review with Dr. Caryl Comes. I will then be in touch with the patient about setting her up for a device implant.

## 2011-12-01 DIAGNOSIS — R42 Dizziness and giddiness: Secondary | ICD-10-CM | POA: Diagnosis not present

## 2011-12-01 DIAGNOSIS — I447 Left bundle-branch block, unspecified: Secondary | ICD-10-CM | POA: Diagnosis not present

## 2011-12-01 DIAGNOSIS — I10 Essential (primary) hypertension: Secondary | ICD-10-CM | POA: Diagnosis not present

## 2011-12-01 DIAGNOSIS — R0989 Other specified symptoms and signs involving the circulatory and respiratory systems: Secondary | ICD-10-CM | POA: Diagnosis not present

## 2011-12-01 HISTORY — PX: NM MYOCAR PERF WALL MOTION: HXRAD629

## 2011-12-06 NOTE — Telephone Encounter (Signed)
I called [facility] and spoke with the Medical Records department. They are going to send me a copy of the patient's stress test today.

## 2011-12-06 NOTE — Telephone Encounter (Signed)
Myoview received. Will review results with Dr. Caryl Comes and then contact the patient to schedule her device implant.

## 2011-12-08 NOTE — Telephone Encounter (Signed)
Myoview reviewed by Dr. Caryl Comes. Per MD, we can go ahead and schedule the patient for her Bi-V ICD. I have left a message for the patient to call.

## 2011-12-09 ENCOUNTER — Encounter: Payer: Self-pay | Admitting: *Deleted

## 2011-12-09 ENCOUNTER — Telehealth: Payer: Self-pay | Admitting: Internal Medicine

## 2011-12-09 ENCOUNTER — Encounter (HOSPITAL_COMMUNITY): Payer: Self-pay | Admitting: Pharmacy Technician

## 2011-12-09 NOTE — Telephone Encounter (Signed)
Previous encounter opened for the same thing. Will close this encounter.  

## 2011-12-09 NOTE — Telephone Encounter (Signed)
Fu call °Patient returning your call °

## 2011-12-09 NOTE — Telephone Encounter (Signed)
Sara Walker 12/09/2011 8:46 AM Signed  Fu call  Patient returning your call

## 2011-12-09 NOTE — Telephone Encounter (Signed)
I left a message for the patient to call. 

## 2011-12-09 NOTE — Telephone Encounter (Signed)
I spoke with the patient. She is scheduled for her Bi-V ICD on 12/12/11 at 7:30 am. She will have labs at the hospital same day.

## 2011-12-11 DIAGNOSIS — E785 Hyperlipidemia, unspecified: Secondary | ICD-10-CM | POA: Diagnosis not present

## 2011-12-11 DIAGNOSIS — I428 Other cardiomyopathies: Secondary | ICD-10-CM | POA: Diagnosis not present

## 2011-12-11 DIAGNOSIS — I5020 Unspecified systolic (congestive) heart failure: Secondary | ICD-10-CM | POA: Diagnosis not present

## 2011-12-11 DIAGNOSIS — Z853 Personal history of malignant neoplasm of breast: Secondary | ICD-10-CM | POA: Diagnosis not present

## 2011-12-11 DIAGNOSIS — I509 Heart failure, unspecified: Secondary | ICD-10-CM | POA: Diagnosis not present

## 2011-12-11 DIAGNOSIS — I10 Essential (primary) hypertension: Secondary | ICD-10-CM | POA: Diagnosis not present

## 2011-12-11 DIAGNOSIS — E119 Type 2 diabetes mellitus without complications: Secondary | ICD-10-CM | POA: Diagnosis not present

## 2011-12-11 MED ORDER — SODIUM CHLORIDE 0.9 % IR SOLN
80.0000 mg | Status: DC
  Filled 2011-12-11: qty 2

## 2011-12-11 MED ORDER — CEFAZOLIN SODIUM-DEXTROSE 2-3 GM-% IV SOLR
2.0000 g | INTRAVENOUS | Status: DC
  Filled 2011-12-11: qty 50

## 2011-12-12 ENCOUNTER — Ambulatory Visit (HOSPITAL_COMMUNITY)
Admission: RE | Admit: 2011-12-12 | Discharge: 2011-12-13 | Disposition: A | Discharge status: Home / Self Care | Payer: Medicare Other | Source: Ambulatory Visit | Attending: Internal Medicine | Admitting: Internal Medicine

## 2011-12-12 ENCOUNTER — Encounter (HOSPITAL_COMMUNITY)
Admission: RE | Disposition: A | Discharge status: Home / Self Care | Payer: Self-pay | Source: Ambulatory Visit | Attending: Internal Medicine

## 2011-12-12 ENCOUNTER — Ambulatory Visit (HOSPITAL_COMMUNITY): Payer: Medicare Other

## 2011-12-12 ENCOUNTER — Encounter: Payer: Self-pay | Admitting: *Deleted

## 2011-12-12 ENCOUNTER — Encounter (HOSPITAL_COMMUNITY): Payer: Self-pay | Admitting: *Deleted

## 2011-12-12 DIAGNOSIS — I5020 Unspecified systolic (congestive) heart failure: Secondary | ICD-10-CM | POA: Diagnosis not present

## 2011-12-12 DIAGNOSIS — I10 Essential (primary) hypertension: Secondary | ICD-10-CM | POA: Diagnosis present

## 2011-12-12 DIAGNOSIS — I5022 Chronic systolic (congestive) heart failure: Secondary | ICD-10-CM

## 2011-12-12 DIAGNOSIS — I5043 Acute on chronic combined systolic (congestive) and diastolic (congestive) heart failure: Secondary | ICD-10-CM | POA: Insufficient documentation

## 2011-12-12 DIAGNOSIS — Z95810 Presence of automatic (implantable) cardiac defibrillator: Secondary | ICD-10-CM | POA: Insufficient documentation

## 2011-12-12 DIAGNOSIS — R55 Syncope and collapse: Secondary | ICD-10-CM

## 2011-12-12 DIAGNOSIS — E119 Type 2 diabetes mellitus without complications: Secondary | ICD-10-CM | POA: Diagnosis not present

## 2011-12-12 DIAGNOSIS — T451X5A Adverse effect of antineoplastic and immunosuppressive drugs, initial encounter: Secondary | ICD-10-CM

## 2011-12-12 DIAGNOSIS — I509 Heart failure, unspecified: Secondary | ICD-10-CM | POA: Insufficient documentation

## 2011-12-12 DIAGNOSIS — E1169 Type 2 diabetes mellitus with other specified complication: Secondary | ICD-10-CM | POA: Diagnosis present

## 2011-12-12 DIAGNOSIS — I5042 Chronic combined systolic (congestive) and diastolic (congestive) heart failure: Secondary | ICD-10-CM | POA: Insufficient documentation

## 2011-12-12 DIAGNOSIS — I517 Cardiomegaly: Secondary | ICD-10-CM | POA: Diagnosis not present

## 2011-12-12 DIAGNOSIS — Z853 Personal history of malignant neoplasm of breast: Secondary | ICD-10-CM | POA: Insufficient documentation

## 2011-12-12 DIAGNOSIS — E785 Hyperlipidemia, unspecified: Secondary | ICD-10-CM | POA: Diagnosis not present

## 2011-12-12 DIAGNOSIS — E7800 Pure hypercholesterolemia, unspecified: Secondary | ICD-10-CM | POA: Diagnosis present

## 2011-12-12 DIAGNOSIS — I428 Other cardiomyopathies: Secondary | ICD-10-CM | POA: Diagnosis not present

## 2011-12-12 DIAGNOSIS — I447 Left bundle-branch block, unspecified: Secondary | ICD-10-CM

## 2011-12-12 DIAGNOSIS — Z01811 Encounter for preprocedural respiratory examination: Secondary | ICD-10-CM | POA: Diagnosis not present

## 2011-12-12 HISTORY — DX: Nausea with vomiting, unspecified: R11.2

## 2011-12-12 HISTORY — DX: Shortness of breath: R06.02

## 2011-12-12 HISTORY — DX: Other specified postprocedural states: Z98.890

## 2011-12-12 HISTORY — PX: BI-VENTRICULAR IMPLANTABLE CARDIOVERTER DEFIBRILLATOR: SHX5459

## 2011-12-12 HISTORY — DX: Atherosclerotic heart disease of native coronary artery without angina pectoris: I25.10

## 2011-12-12 HISTORY — DX: Heart failure, unspecified: I50.9

## 2011-12-12 HISTORY — DX: Angina pectoris, unspecified: I20.9

## 2011-12-12 LAB — GLUCOSE, CAPILLARY
Glucose-Capillary: 118 mg/dL — ABNORMAL HIGH (ref 70–99)
Glucose-Capillary: 93 mg/dL (ref 70–99)

## 2011-12-12 LAB — BASIC METABOLIC PANEL
BUN: 31 mg/dL — ABNORMAL HIGH (ref 6–23)
Calcium: 9.6 mg/dL (ref 8.4–10.5)
Chloride: 98 mEq/L (ref 96–112)
Creatinine, Ser: 0.85 mg/dL (ref 0.50–1.10)
GFR calc Af Amer: 85 mL/min — ABNORMAL LOW (ref >90)
GFR calc non Af Amer: 73 mL/min — ABNORMAL LOW (ref >90)

## 2011-12-12 LAB — PROTIME-INR: Prothrombin Time: 14.4 seconds (ref 11.6–15.2)

## 2011-12-12 LAB — CBC
HCT: 41.7 % (ref 36.0–46.0)
MCH: 31.3 pg (ref 26.0–34.0)
MCHC: 33.6 g/dL (ref 30.0–36.0)
MCV: 93.1 fL (ref 78.0–100.0)
Platelets: 142 10*3/uL — ABNORMAL LOW (ref 150–400)
RDW: 15.6 % — ABNORMAL HIGH (ref 11.5–15.5)
WBC: 6.8 10*3/uL (ref 4.0–10.5)

## 2011-12-12 LAB — SURGICAL PCR SCREEN: Staphylococcus aureus: NEGATIVE

## 2011-12-12 SURGERY — BI-VENTRICULAR IMPLANTABLE CARDIOVERTER DEFIBRILLATOR  (CRT-D)
Anesthesia: LOCAL

## 2011-12-12 MED ORDER — CHLORHEXIDINE GLUCONATE 4 % EX LIQD: 60.0000 mL | Freq: Once | CUTANEOUS | Status: DC

## 2011-12-12 MED ORDER — ACETAMINOPHEN 325 MG PO TABS: 325.0000 mg | ORAL_TABLET | ORAL | Status: DC | PRN

## 2011-12-12 MED ORDER — FENTANYL CITRATE 0.05 MG/ML IJ SOLN
INTRAMUSCULAR | Status: AC
  Filled 2011-12-12: qty 2

## 2011-12-12 MED ORDER — SPIRONOLACTONE 25 MG PO TABS
25.0000 mg | ORAL_TABLET | Freq: Two times a day (BID) | ORAL | Status: DC
  Administered 2011-12-12 – 2011-12-13 (×2): 25 mg via ORAL
  Filled 2011-12-12 (×5): qty 1

## 2011-12-12 MED ORDER — SODIUM CHLORIDE 0.45 % IV SOLN: INTRAVENOUS | Status: DC

## 2011-12-12 MED ORDER — SODIUM CHLORIDE 0.9 % IV SOLN: INTRAVENOUS | Status: AC

## 2011-12-12 MED ORDER — CARVEDILOL PHOSPHATE ER 40 MG PO CP24
40.0000 mg | ORAL_CAPSULE | Freq: Every day | ORAL | Status: DC
Start: 2011-12-12 — End: 2011-12-13
  Administered 2011-12-13: 40 mg via ORAL
  Filled 2011-12-12 (×2): qty 1

## 2011-12-12 MED ORDER — MIDAZOLAM HCL 5 MG/5ML IJ SOLN
INTRAMUSCULAR | Status: AC
  Filled 2011-12-12: qty 5

## 2011-12-12 MED ORDER — HEPARIN (PORCINE) IN NACL 2-0.9 UNIT/ML-% IJ SOLN
INTRAMUSCULAR | Status: AC
  Filled 2011-12-12: qty 1000

## 2011-12-12 MED ORDER — CEFAZOLIN SODIUM 1-5 GM-% IV SOLN
1.0000 g | Freq: Four times a day (QID) | INTRAVENOUS | Status: AC
  Administered 2011-12-12 – 2011-12-13 (×3): 1 g via INTRAVENOUS
  Filled 2011-12-12 (×4): qty 50

## 2011-12-12 MED ORDER — LIDOCAINE HCL (PF) 1 % IJ SOLN
INTRAMUSCULAR | Status: AC
  Filled 2011-12-12: qty 60

## 2011-12-12 MED ORDER — SODIUM CHLORIDE 0.9 % IJ SOLN: 3.0000 mL | Freq: Two times a day (BID) | INTRAMUSCULAR | Status: DC

## 2011-12-12 MED ORDER — SODIUM CHLORIDE 0.9 % IJ SOLN: 3.0000 mL | INTRAMUSCULAR | Status: DC | PRN

## 2011-12-12 MED ORDER — ONDANSETRON HCL 4 MG/2ML IJ SOLN
4.0000 mg | Freq: Four times a day (QID) | INTRAMUSCULAR | Status: DC | PRN
  Administered 2011-12-12: 4 mg via INTRAVENOUS
  Filled 2011-12-12: qty 2

## 2011-12-12 MED ORDER — TORSEMIDE 20 MG PO TABS
50.0000 mg | ORAL_TABLET | Freq: Two times a day (BID) | ORAL | Status: DC
  Filled 2011-12-12 (×3): qty 1

## 2011-12-12 MED ORDER — TORSEMIDE 20 MG PO TABS
70.0000 mg | ORAL_TABLET | Freq: Two times a day (BID) | ORAL | Status: DC
  Administered 2011-12-12 – 2011-12-13 (×2): 70 mg via ORAL
  Filled 2011-12-12 (×4): qty 1

## 2011-12-12 MED ORDER — MUPIROCIN 2 % EX OINT
TOPICAL_OINTMENT | Freq: Two times a day (BID) | CUTANEOUS | Status: DC
  Administered 2011-12-12: 1 via NASAL
  Filled 2011-12-12 (×2): qty 22

## 2011-12-12 MED ORDER — TORSEMIDE 20 MG PO TABS
20.0000 mg | ORAL_TABLET | Freq: Two times a day (BID) | ORAL | Status: DC
  Filled 2011-12-12 (×3): qty 1

## 2011-12-12 MED ORDER — SODIUM CHLORIDE 0.9 % IV SOLN: 250.0000 mL | INTRAVENOUS | Status: DC

## 2011-12-12 MED ORDER — ALLOPURINOL 100 MG PO TABS
100.0000 mg | ORAL_TABLET | Freq: Two times a day (BID) | ORAL | Status: DC
  Administered 2011-12-12 – 2011-12-13 (×3): 100 mg via ORAL
  Filled 2011-12-12 (×4): qty 1

## 2011-12-12 NOTE — Care Management Note (Signed)
    Page 1 of 1   12/12/2011     3:43:50 PM   CARE MANAGEMENT NOTE 12/12/2011  Patient:  Sara Walker, Sara Walker   Account Number:  [id number]  Date Initiated:  12/12/2011  Documentation initiated by:  GRAVES-BIGELOW,Jalayah Gutridge  Subjective/Objective Assessment:   Pt admitted for Dual chamber ICD with LV lead placement. Plan for d/c in am.     Action/Plan:   No needs from CM at this time.   Anticipated DC Date:  12/13/2011   Anticipated DC Plan:  [place]  CM consult      Choice offered to / List presented to:             Status of service:  Completed, signed off Medicare Important Message given?   (If response is "NO", the following Medicare IM given date fields will be blank) Date Medicare IM given:   Date Additional Medicare IM given:    Discharge Disposition:  HOME/SELF CARE  Per UR Regulation:    If discussed at Long Length of Stay Meetings, dates discussed:    Comments:

## 2011-12-12 NOTE — H&P (Signed)
   HPI  Sara Walker is a 61 y.o. female Here for CRT-ICD implantation  For nonischemic cardiomypoathy with Class 2 CHF No c/o SOB/CP edema No syncope  Past Medical History  Diagnosis Date  . Goiter   . DM2 (diabetes mellitus, type 2)   . Chronic systolic heart failure   . Invasive ductal carcinoma of breast   . Gout   . Hyperlipidemia   . Hypertension   . Breast cancer   . LBBB (left bundle branch block)     evidnece of RBBB fall 2012  . Cardiomyopathy secondary to chemotherapy     doxurubicin  . Syncope     Past Surgical History  Procedure Date  . Mastectomy     right  . Tunneled venous catheter placement     removed    Current Facility-Administered Medications  Medication Dose Route Frequency Provider Last Rate Last Dose  . 0.45 % sodium chloride infusion   Intravenous Continuous Deboraha Sprang, MD      . 0.9 %  sodium chloride infusion  250 mL Intravenous Continuous Deboraha Sprang, MD      . ceFAZolin (ANCEF) IVPB 2 g/50 mL premix  2 g Intravenous On Call Deboraha Sprang, MD      . chlorhexidine (HIBICLENS) 4 % liquid 4 application  60 mL Topical Once Deboraha Sprang, MD      . gentamicin (GARAMYCIN) 80 mg in sodium chloride irrigation 0.9 % 500 mL irrigation  80 mg Irrigation On Call Deboraha Sprang, MD      . mupirocin ointment (BACTROBAN) 2 %   Nasal BID Deboraha Sprang, MD   1 application at [id number] [phone]  . sodium chloride 0.9 % injection 3 mL  3 mL Intravenous Q12H Deboraha Sprang, MD      . sodium chloride 0.9 % injection 3 mL  3 mL Intravenous PRN Deboraha Sprang, MD        Allergies  Allergen Reactions  . Lasix (Furosemide)     Causes gout  . Codeine     Crazy  . Digitalis Other (See Comments)    "saw yellow circles"  . Other     Nitro spray    Review of Systems negative except from HPI and PMH  Physical Exam BP 104/66  Pulse 75  Temp(Src) 97.1 F (36.2 C) (Oral)  Resp 18  Ht 5\' 7"  (1.702 m)  Wt 190 lb (86.183 kg)  BMI 29.76 kg/m2  SpO2  98% Well developed and well nourished in no acute distress HENT normal E scleral and icterus clear Neck Supple JVP flat; carotids brisk and full Clear to ausculation Regular rate and rhythm, no murmurs gallops or rub Soft with active bowel sounds No clubbing cyanosis none Edema Alert and oriented, grossly normal motor and sensory function Skin Warm and Dry    Assessment and  Plan  NICM/LBBBCHF   For CRT - Dtodya and risks reviewed

## 2011-12-12 NOTE — CV Procedure (Signed)
Preop DX  LBBB  NCM  CHF\ Post op Dx same  Proc   Dual chamber ICD with LV lead placement and DFT check  cx non apparent  Dictation No [id number]

## 2011-12-13 ENCOUNTER — Ambulatory Visit (HOSPITAL_COMMUNITY): Payer: Medicare Other

## 2011-12-13 ENCOUNTER — Encounter (HOSPITAL_COMMUNITY): Payer: Self-pay | Admitting: Nurse Practitioner

## 2011-12-13 DIAGNOSIS — T451X5A Adverse effect of antineoplastic and immunosuppressive drugs, initial encounter: Secondary | ICD-10-CM | POA: Diagnosis not present

## 2011-12-13 DIAGNOSIS — I428 Other cardiomyopathies: Secondary | ICD-10-CM | POA: Diagnosis not present

## 2011-12-13 DIAGNOSIS — Z950 Presence of cardiac pacemaker: Secondary | ICD-10-CM | POA: Diagnosis not present

## 2011-12-13 DIAGNOSIS — E785 Hyperlipidemia, unspecified: Secondary | ICD-10-CM | POA: Diagnosis not present

## 2011-12-13 DIAGNOSIS — J9819 Other pulmonary collapse: Secondary | ICD-10-CM | POA: Diagnosis not present

## 2011-12-13 DIAGNOSIS — E119 Type 2 diabetes mellitus without complications: Secondary | ICD-10-CM | POA: Diagnosis not present

## 2011-12-13 DIAGNOSIS — I10 Essential (primary) hypertension: Secondary | ICD-10-CM | POA: Diagnosis not present

## 2011-12-13 DIAGNOSIS — I5020 Unspecified systolic (congestive) heart failure: Secondary | ICD-10-CM | POA: Diagnosis not present

## 2011-12-13 DIAGNOSIS — I509 Heart failure, unspecified: Secondary | ICD-10-CM | POA: Diagnosis not present

## 2011-12-13 DIAGNOSIS — I429 Cardiomyopathy, unspecified: Secondary | ICD-10-CM | POA: Diagnosis not present

## 2011-12-13 MED ORDER — YOU HAVE A PACEMAKER BOOK
Freq: Once | Status: AC
  Administered 2011-12-13: 11:00:00
  Filled 2011-12-13: qty 1

## 2011-12-13 MED ORDER — TORSEMIDE 100 MG PO TABS: 50.0000 mg | ORAL_TABLET | Freq: Every day | ORAL | Status: DC

## 2011-12-13 MED ORDER — TORSEMIDE 20 MG PO TABS: 20.0000 mg | ORAL_TABLET | Freq: Every day | ORAL | Status: DC

## 2011-12-13 NOTE — Discharge Summary (Signed)
Patient ID: Sara Walker,  MRN: [id number], DOB/AGE: 16-Jul-1950 61 y.o.  Admit date: 12/12/2011 Discharge date: 12/13/2011  Primary Electrophysiologist: Olin Pia, MD  Discharge Diagnoses Principal Problem:  *Nonischemic cardiomyopathy  **S/P SJM Quadra Assura Bi-Ventricular AICD implant this admission. Active Problems:  DM2 (diabetes mellitus, type 2)  Hyperlipidemia  Hypertension  Chronic systolic heart failure  Allergies Allergies  Allergen Reactions  . Lasix (Furosemide)     Causes gout  . Codeine     Crazy  . Digitalis Other (See Comments)    "saw yellow circles"  . Other     Nitro spray   Procedures  12/12/2011 Bi-Ventricular ICD Implant  St. Jude Medical Quadra Assura device, model number [id number], serial number [id number] _____________  12/13/2011 Chest X-Ray  IMPRESSION: Pacer wires in good position without complicating features. _____________  History of Present Illness  61 year old female with history of nonischemic cardiomyopathy felt to be secondary to chemotherapy received in the 1990's, who was recently seen in clinic by Dr. Caryl Comes for consideration of AICD placement.. It was noted that patient had a left bundle branch block and it was felt that she would benefit from biventricular ICD placement.  Hospital Course  Patient [facility] and underwent successful placement of a St. Jude medical Quadra Assura biventricular AICD. She tolerated this procedure well and post procedure chest x-ray shows no evidence of pneumothorax. She will be discharged home today in good condition.  CC:  Less sob over night   Brisk diuresis  Discharge Vitals Blood pressure 108/74, pulse 73, temperature 97.6 F (36.4 C), temperature source Oral, resp. rate 18, height 5\' 7"  (1.702 m), weight 190 lb (86.183 kg), SpO2 98.00%.  Filed Weights   12/12/11 0610  Weight: 190 lb (86.183 kg)   Labs  CBC  Basename 12/12/11 0631  WBC 6.8  NEUTROABS --    HGB 14.0  HCT 41.7  MCV 93.1  PLT A999333*   Basic Metabolic Panel  Basename [id number] 0631  NA 135  K 4.1  CL 98  CO2 23  GLUCOSE 160*  BUN 31*  CREATININE 0.85  CALCIUM 9.6  MG --  PHOS --   Disposition  Pt is being discharged home today in good condition.  Follow-up Plans & Appointments  Follow-up Information    Follow up with Virl Axe, MD on 03/20/2012. (9:30)    Contact information:   [address], [place] [phone]       Follow up with Sanda Corie Vavra, MD. (Dr. Lurline Del office will contact you.)    Contact information:   [address] [place] [phone]         Discharge Medications  Medication List  As of 12/13/2011  1:46 PM   TAKE these medications         allopurinol 100 MG tablet   Commonly known as: ZYLOPRIM   Take 100 mg by mouth 2 (two) times daily.      carvedilol 40 MG 24 hr capsule   Commonly known as: COREG CR   Take 40 mg by mouth daily.      multivitamins ther. w/minerals Tabs   Take 1 tablet by mouth daily.      spironolactone 25 MG tablet   Commonly known as: ALDACTONE   Take 25 mg by mouth 2 (two) times daily.      torsemide 100 MG tablet   Commonly known  as: DEMADEX   Take 0.5 tablets (50 mg total) by mouth daily. Takes 50 mg plus 20 mg to equal 70 mg      torsemide 20 MG tablet   Commonly known as: DEMADEX   Take 1 tablet (20 mg total) by mouth daily. Takes 20 mg to 50 mg to equal 70 mg           Well developed and nourished in no acute distress HENT normal Neck supple with JVP-flat Clear Pocket without hematoma Regular rate and rhythm, no murmurs or gallops Abd-soft with active BS No Clubbing cyanosis edema Skin-warm and dry A & Oriented  Grossly normal sensory and motor function   Outstanding Labs/Studies  Bmet @ the time of device clinic f/u. Decrease diuretic to once daily  Duration of Discharge  Encounter   Greater than 30 minutes including physician time.  Signed, Murray Hodgkins NP 12/13/2011, 1:46 PM   Richardson Landry Caryl Comes

## 2011-12-13 NOTE — Op Note (Signed)
NAME:  Sara Walker, Sara Walker NO.:  [id number]  MEDICAL RECORD NO.:  [id number]  LOCATION:  [facility]                         FACILITY:  [facility]  PHYSICIAN:  Deboraha Sprang, MD, FACCDATE OF BIRTH:  Oct 15, 1950  DATE OF PROCEDURE:  12/12/2011 DATE OF DISCHARGE:                              OPERATIVE REPORT   PREOPERATIVE DIAGNOSES:  Nonischemic cardiomyopathy, congestive heart failure, left bundle-branch block.  POSTOPERATIVE DIAGNOSES:  Nonischemic cardiomyopathy, congestive heart failure, left bundle-branch block.  PROCEDURE:  Dual-chamber defibrillator implantation with left ventricular lead placement and intraoperative defibrillation threshold testing.  Following obtained informed consent, the patient was brought to the electrophysiology laboratory and placed on the fluoroscopic table in supine position.  After routine prep and drape of the left upper chest, lidocaine was infiltrated in the prepectoral subclavicular region.  An incision was made and carried down to the layer of the prepectoral fascia using electrocautery and sharp dissection.  A pocket was formed similarly.  Hemostasis was obtained.  Thereafter, attention was turned to gain access to the extrathoracic left subclavian vein and apart from the fact that I did put the wrong guidewire __________ 4 separate venipunctures were accomplished.  Three guidewires were placed and retained, and sequentially an 8-French, 9.5- [place], and 7-French sheath were placed through which were passed a St. Jude [id number] defibrillator lead, serial number [id number] and a Medtronic MB2 coronary sinus cannulation system and a St. Jude [phone] active fixation atrial lead, serial number [id number].  Under fluoroscopic guidance, the RV lead was moved to the right ventricular apex.  The bipolar R-wave was noted with a pace impedance of 750 and threshold of 0.8 at 0.5.  Current of threshold was 1.2 mA. There was no diaphragmatic pacing  at 10 V.  The current of injury was brisk.  This lead was secured to the prepectoral fascia.  We then were able to gain access to these coronary sinus with relative ease and a venogram demonstrated a single lateral branch, which we were able to cannulate and 2 into, which we were able to place 1458Q Quartet quadripolar LV lead, serial number [id number].  It was manipulated to its distal ramification, which was at the junction of the mid and distal- third.  This ended up with 2 turns in the lead and we thought that the lead would be quite secured.  The LV amplitude was 4.4, the pace impedance of 724, a threshold in a 2-3 configuration of 2 V at 0.5 msec, current of threshold was 2.7 mA, and there was no diaphragmatic pacing at 10 V.  The 9.5-French sheath was removed and the atrial lead was then placed and positioned in the right atrial appendage where bipolar P-wave was 3.8 with a pace impedance of 549 with threshold 1.3 at 0.5.  Current of threshold was 2.5 mA and again the current of injury was brisk.  The RA lead was secured to the prepectoral fascia and the deployment system was then removed and the LV lead was secured to the prepectoral fascia. The leads were then attached to a Quadra Assura device, model number [id number], serial number [id number].  Through the device, bipolar P-wave was 2 with a pace impedance  of 440, threshold of 1 V at 0.5 sec.  The RV amplitude was 12 with a pace impedance of 750, threshold 0.5 at 0.5, and the LV impedance was 510 and a threshold of 3-2 coil was 1.75-0.5 and 2- 3 was not recorded.  Interestingly, the LV to RV delay was 160 msec over on the LV free-wall.  High-voltage impedance was 59 ohms.  The pocket was copiously irrigated with antibiotic-containing saline solution. Hemostasis was assured.  The leads and the pulse generator were placed in the pocket and secured to the prepectoral fascia.  The wound was then closed in 3 layers in normal fashion.   The wound was washed and dried. Benzoin and Steri-Strips dressing was applied.  Needle counts, sponge counts, and instrument counts were correct at the end of the procedure according to staff.  We then undertook defibrillation threshold testing.  Ventricular fibrillation was induced via the T-wave shock.  After total duration of 5 seconds, a 15-joule shock was delivered through a measured impedance of 67 ohms terminating ventricular fibrillation and restoring an AV paced rhythm.  The patient tolerated this without apparent complication.     Deboraha Sprang, MD, [facility]     SCK/MEDQ  D:  12/12/2011  T:  12/12/2011  Job:  [id number]

## 2011-12-13 NOTE — Progress Notes (Signed)
Pt received discharge instructions with no further questions. She is aware of arm movement restrictions and of follow up appointments. She understands change in demadex instructions. Went over signs of infections, how to measure a pulse, restrictions with the pacemaker/ICD. She has her temporary card and pacemaker books. Stable for DC. Volunteer took out in wheelchair to friend's car for transport home. Lillia Mountain, RN

## 2011-12-13 NOTE — Progress Notes (Signed)
See discharfge note

## 2011-12-13 NOTE — Discharge Instructions (Signed)
***  PLEASE REMEMBER TO BRING ALL OF YOUR MEDICATIONS TO EACH OF YOUR FOLLOW-UP OFFICE VISITS.    Supplemental Discharge Instructions for  Pacemaker/Defibrillator Patients  Activity No heavy lifting or vigorous activity with your left/right arm for 6 to 8 weeks.  Do not raise your left/right arm above your head for one week.  Gradually raise your affected arm as drawn below.                    6/8                     /        6/9               /            6/10         /         6/11          NO DRIVING for      ; you may begin driving on   [id number] . WOUND CARE   Keep the wound area clean and dry.  Do not get this area wet for one week. No showers for one week; you may shower on  6/11     .   The tape/steri-strips on your wound will fall off; do not pull them off.  No bandage is needed on the site.  DO  NOT apply any creams, oils, or ointments to the wound area.   If you notice any drainage or discharge from the wound, any swelling or bruising at the site, or you develop a fever > 101? F after you are discharged home, call the office at once.  Special Instructions   You are still able to use cellular telephones; use the ear opposite the side where you have your pacemaker/defibrillator.  Avoid carrying your cellular phone near your device.   When traveling through airports, show security personnel your identification card to avoid being screened in the metal detectors.  Ask the security personnel to use the hand wand.   Avoid arc welding equipment, MRI testing (magnetic resonance imaging), TENS units (transcutaneous nerve stimulators).  Call the office for questions about other devices.   Avoid electrical appliances that are in poor condition or are not properly grounded.   Microwave ovens are safe to be near or to operate.  Additional information for defibrillator patients should your device go off:   If your device goes off ONCE and you feel fine afterward, notify the device clinic nurses.   If your device goes off ONCE and you do not feel well afterward, call 911.   If your device goes off TWICE, call 911.   If your device goes off THREE times in one day, call 911.  DO NOT DRIVE YOURSELF OR A FAMILY MEMBER WITH A DEFIBRILLATOR TO THE HOSPITAL--CALL 911.

## 2011-12-21 ENCOUNTER — Encounter: Payer: Self-pay | Admitting: Internal Medicine

## 2011-12-21 ENCOUNTER — Ambulatory Visit: Payer: Medicare Other

## 2011-12-21 ENCOUNTER — Other Ambulatory Visit: Payer: Medicare Other

## 2011-12-21 ENCOUNTER — Ambulatory Visit (INDEPENDENT_AMBULATORY_CARE_PROVIDER_SITE_OTHER): Payer: Medicare Other | Admitting: *Deleted

## 2011-12-21 DIAGNOSIS — T451X5A Adverse effect of antineoplastic and immunosuppressive drugs, initial encounter: Secondary | ICD-10-CM

## 2011-12-21 DIAGNOSIS — I429 Cardiomyopathy, unspecified: Secondary | ICD-10-CM | POA: Diagnosis not present

## 2011-12-21 DIAGNOSIS — I427 Cardiomyopathy due to drug and external agent: Secondary | ICD-10-CM

## 2011-12-21 LAB — ICD DEVICE OBSERVATION
AL THRESHOLD: 0.5 V
ATRIAL PACING ICD: 17 pct
BAMS-0001: 170 {beats}/min
BAMS-0003: 70 {beats}/min
CHARGE TIME: 8.5 s
DEV-0020ICD: NEGATIVE
MODE SWITCH EPISODES: 0
PACEART VT: 0
RV LEAD THRESHOLD: 0.5 V
TOT-0006: 20130603000000
TOT-0010: 1
TZAT-0001SLOWVT: 1
TZAT-0012SLOWVT: 200 ms
TZAT-0020SLOWVT: 1 ms
TZON-0005SLOWVT: 6
TZST-0001SLOWVT: 4
TZST-0003SLOWVT: 30 J
TZST-0003SLOWVT: 40 J
VENTRICULAR PACING ICD: 98 pct

## 2011-12-21 NOTE — Progress Notes (Signed)
Wound defib check

## 2011-12-22 ENCOUNTER — Other Ambulatory Visit: Payer: Medicare Other

## 2011-12-22 ENCOUNTER — Ambulatory Visit: Payer: Medicare Other

## 2011-12-22 DIAGNOSIS — I509 Heart failure, unspecified: Secondary | ICD-10-CM | POA: Diagnosis not present

## 2012-01-05 DIAGNOSIS — E119 Type 2 diabetes mellitus without complications: Secondary | ICD-10-CM | POA: Diagnosis not present

## 2012-01-05 DIAGNOSIS — E7800 Pure hypercholesterolemia, unspecified: Secondary | ICD-10-CM | POA: Diagnosis not present

## 2012-01-10 DIAGNOSIS — I10 Essential (primary) hypertension: Secondary | ICD-10-CM | POA: Diagnosis not present

## 2012-01-10 DIAGNOSIS — I5022 Chronic systolic (congestive) heart failure: Secondary | ICD-10-CM | POA: Diagnosis not present

## 2012-01-10 DIAGNOSIS — E1169 Type 2 diabetes mellitus with other specified complication: Secondary | ICD-10-CM | POA: Diagnosis not present

## 2012-01-10 DIAGNOSIS — E782 Mixed hyperlipidemia: Secondary | ICD-10-CM | POA: Diagnosis not present

## 2012-01-23 DIAGNOSIS — L600 Ingrowing nail: Secondary | ICD-10-CM | POA: Diagnosis not present

## 2012-01-23 DIAGNOSIS — B351 Tinea unguium: Secondary | ICD-10-CM | POA: Diagnosis not present

## 2012-01-23 DIAGNOSIS — E1169 Type 2 diabetes mellitus with other specified complication: Secondary | ICD-10-CM | POA: Diagnosis not present

## 2012-01-23 DIAGNOSIS — M79609 Pain in unspecified limb: Secondary | ICD-10-CM | POA: Diagnosis not present

## 2012-02-03 DIAGNOSIS — I509 Heart failure, unspecified: Secondary | ICD-10-CM | POA: Diagnosis not present

## 2012-02-03 DIAGNOSIS — E119 Type 2 diabetes mellitus without complications: Secondary | ICD-10-CM | POA: Diagnosis not present

## 2012-02-03 DIAGNOSIS — I10 Essential (primary) hypertension: Secondary | ICD-10-CM | POA: Diagnosis not present

## 2012-03-13 DIAGNOSIS — E1169 Type 2 diabetes mellitus with other specified complication: Secondary | ICD-10-CM | POA: Diagnosis not present

## 2012-03-13 DIAGNOSIS — E782 Mixed hyperlipidemia: Secondary | ICD-10-CM | POA: Diagnosis not present

## 2012-03-13 DIAGNOSIS — Z0000 Encounter for general adult medical examination without abnormal findings: Secondary | ICD-10-CM | POA: Diagnosis not present

## 2012-03-13 DIAGNOSIS — E039 Hypothyroidism, unspecified: Secondary | ICD-10-CM | POA: Diagnosis not present

## 2012-03-16 DIAGNOSIS — I5022 Chronic systolic (congestive) heart failure: Secondary | ICD-10-CM | POA: Diagnosis not present

## 2012-03-16 DIAGNOSIS — I10 Essential (primary) hypertension: Secondary | ICD-10-CM | POA: Diagnosis not present

## 2012-03-16 DIAGNOSIS — E782 Mixed hyperlipidemia: Secondary | ICD-10-CM | POA: Diagnosis not present

## 2012-03-16 DIAGNOSIS — E049 Nontoxic goiter, unspecified: Secondary | ICD-10-CM | POA: Diagnosis not present

## 2012-03-20 ENCOUNTER — Encounter: Payer: Self-pay | Admitting: Internal Medicine

## 2012-03-20 ENCOUNTER — Ambulatory Visit (INDEPENDENT_AMBULATORY_CARE_PROVIDER_SITE_OTHER): Payer: Medicare Other | Admitting: Internal Medicine

## 2012-03-20 VITALS — BP 108/68 | HR 72 | Ht 67.0 in | Wt 219.1 lb

## 2012-03-20 DIAGNOSIS — T451X5A Adverse effect of antineoplastic and immunosuppressive drugs, initial encounter: Secondary | ICD-10-CM | POA: Diagnosis not present

## 2012-03-20 DIAGNOSIS — I429 Cardiomyopathy, unspecified: Secondary | ICD-10-CM | POA: Diagnosis not present

## 2012-03-20 DIAGNOSIS — I427 Cardiomyopathy due to drug and external agent: Secondary | ICD-10-CM

## 2012-03-20 DIAGNOSIS — I428 Other cardiomyopathies: Secondary | ICD-10-CM | POA: Diagnosis not present

## 2012-03-20 DIAGNOSIS — Z95810 Presence of automatic (implantable) cardiac defibrillator: Secondary | ICD-10-CM

## 2012-03-20 DIAGNOSIS — I5022 Chronic systolic (congestive) heart failure: Secondary | ICD-10-CM

## 2012-03-20 LAB — ICD DEVICE OBSERVATION
AL AMPLITUDE: 1.6 mv
AL THRESHOLD: 0.5 V
DEV-0020ICD: NEGATIVE
LV LEAD THRESHOLD: 1.375 V
RV LEAD AMPLITUDE: 12 mv
RV LEAD THRESHOLD: 0.5 V
TZAT-0020SLOWVT: 1 ms
TZON-0005SLOWVT: 6
TZON-0010SLOWVT: 40 ms
TZST-0001SLOWVT: 2
TZST-0001SLOWVT: 4
TZST-0001SLOWVT: 5
TZST-0003SLOWVT: 40 J

## 2012-03-20 NOTE — Patient Instructions (Addendum)
Your physician has recommended you make the following change in your medication:  1) Decrease torsemide to 100 mg 1/2 tablet by mouth twice daily.  Dr. Caryl Comes will see you back on an as needed basis.  Dr. Crissie Reese- plastic surgery: [phone]

## 2012-03-20 NOTE — Assessment & Plan Note (Signed)
As above   With her BUN being up to 32, we will decrease her furosemide from 70 twice a day  To 50 twice a day;  she will followup with her PCP in a couple of weeks as scheduled and herbal to be rechecked at that time

## 2012-03-20 NOTE — Assessment & Plan Note (Signed)
She is much improved from a heart failure point of view. Notably however, she is not on an ACE inhibitor or an ARB. I don't know the contraindication to aspirin followup with Dr C about this.

## 2012-03-20 NOTE — Assessment & Plan Note (Signed)
The patient's device was interrogated and the information was fully reviewed.  The device was reprogrammed to maximize longevity  

## 2012-03-20 NOTE — Progress Notes (Signed)
Patient Care Team: Pcp Not In System as PCP - General   HPI  Sara Walker is a 61 y.o. female Seen in followup for CRT-D implantation for nonischemic cardiomyopathy attributed to chemotherapy with congestive heart failure. This was undertaken June 2013   She is much improved from a shortness of breath point of view. Attempts to decrease her diuretics however previously failed. She comes in today with blood work demonstrated BUN of 32 and creatinine 0.9.  She has discomfort at the incision site hyperesthesia.   Past Medical History  Diagnosis Date  . Goiter   . DM2 (diabetes mellitus, type 2)   . Chronic systolic heart failure   . Invasive ductal carcinoma of breast   . Gout   . Hyperlipidemia   . Hypertension   . Breast cancer   . LBBB (left bundle branch block)     evidnece of RBBB fall 2012  . Cardiomyopathy secondary to chemotherapy     a.  doxurubicin;  b. s/p SJM Quadra Assura CRT-D, model [id number], Ser# [id number]  . Syncope   . PONV (postoperative nausea and vomiting)     nausea post ICD placement relieved with Zofran  . Coronary artery disease   . Angina   . CHF (congestive heart failure)   . Shortness of breath     Past Surgical History  Procedure Date  . Mastectomy     right  . Tunneled venous catheter placement     removed  . Breast surgery     Left mastectomy  . Insert / replace / remove pacemaker     ICD placed    Current Outpatient Prescriptions  Medication Sig Dispense Refill  . allopurinol (ZYLOPRIM) 100 MG tablet Take 100 mg by mouth 2 (two) times daily.       . carvedilol (COREG CR) 40 MG 24 hr capsule Take 40 mg by mouth daily.      . colchicine 0.6 MG tablet Take 0.6 mg by mouth daily.      . Multiple Vitamins-Minerals (MULTIVITAMINS THER. W/MINERALS) TABS Take 1 tablet by mouth daily.      Marland Kitchen spironolactone (ALDACTONE) 25 MG tablet Take 25 mg by mouth 2 (two) times daily.       Marland Kitchen torsemide (DEMADEX) 100 MG tablet Take 0.5 tablets (50 mg  total) by mouth daily. Takes 50 mg plus 20 mg to equal 70 mg        Allergies  Allergen Reactions  . Lasix (Furosemide)     Causes gout  . Codeine     Crazy  . Digitalis Other (See Comments)    "saw yellow circles"  . Other     Nitro spray    Review of Systems negative except from HPI and PMH  Physical Exam BP 108/68  Pulse 72  Ht 5\' 7"  (1.702 m)  Wt 219 lb 1.9 oz (99.392 kg)  BMI 34.32 kg/m2 Well developed and well nourished in no acute distress HENT normal E scleral and icterus clear Neck Supple JVP flat; carotids brisk and full Clear to ausculation Device pocket well healed; without hematoma or erythema there is hyperesthesia and a small keloid along the incision line Regular rate and rhythm, no murmurs gallops or rub Soft with active bowel sounds No clubbing cyanosis none Edema Alert and oriented, grossly normal motor and sensory function Skin Warm and Dry    Assessment and  Plan

## 2012-03-27 NOTE — Addendum Note (Signed)
Addended by: Levora Angel L on: 03/27/2012 11:26 AM   Modules accepted: Orders

## 2012-04-02 DIAGNOSIS — L600 Ingrowing nail: Secondary | ICD-10-CM | POA: Diagnosis not present

## 2012-04-02 DIAGNOSIS — E1149 Type 2 diabetes mellitus with other diabetic neurological complication: Secondary | ICD-10-CM | POA: Diagnosis not present

## 2012-04-02 DIAGNOSIS — L97509 Non-pressure chronic ulcer of other part of unspecified foot with unspecified severity: Secondary | ICD-10-CM | POA: Diagnosis not present

## 2012-04-02 DIAGNOSIS — B351 Tinea unguium: Secondary | ICD-10-CM | POA: Diagnosis not present

## 2012-04-12 DIAGNOSIS — E1149 Type 2 diabetes mellitus with other diabetic neurological complication: Secondary | ICD-10-CM | POA: Diagnosis not present

## 2012-04-12 DIAGNOSIS — I10 Essential (primary) hypertension: Secondary | ICD-10-CM | POA: Diagnosis not present

## 2012-04-12 DIAGNOSIS — E782 Mixed hyperlipidemia: Secondary | ICD-10-CM | POA: Diagnosis not present

## 2012-04-12 DIAGNOSIS — E1169 Type 2 diabetes mellitus with other specified complication: Secondary | ICD-10-CM | POA: Diagnosis not present

## 2012-05-04 ENCOUNTER — Encounter (INDEPENDENT_AMBULATORY_CARE_PROVIDER_SITE_OTHER): Payer: Medicare Other | Admitting: Internal Medicine

## 2012-05-04 DIAGNOSIS — R0989 Other specified symptoms and signs involving the circulatory and respiratory systems: Secondary | ICD-10-CM

## 2012-05-04 NOTE — Progress Notes (Signed)
This encounter was created in error - please disregard.

## 2012-05-11 DIAGNOSIS — E109 Type 1 diabetes mellitus without complications: Secondary | ICD-10-CM | POA: Diagnosis not present

## 2012-05-16 DIAGNOSIS — J019 Acute sinusitis, unspecified: Secondary | ICD-10-CM | POA: Diagnosis not present

## 2012-05-31 DIAGNOSIS — M1A00X0 Idiopathic chronic gout, unspecified site, without tophus (tophi): Secondary | ICD-10-CM | POA: Diagnosis not present

## 2012-06-01 ENCOUNTER — Encounter: Payer: Medicare Other | Admitting: Internal Medicine

## 2012-06-12 DIAGNOSIS — M2010 Hallux valgus (acquired), unspecified foot: Secondary | ICD-10-CM | POA: Diagnosis not present

## 2012-06-12 DIAGNOSIS — E1169 Type 2 diabetes mellitus with other specified complication: Secondary | ICD-10-CM | POA: Diagnosis not present

## 2012-06-12 DIAGNOSIS — L600 Ingrowing nail: Secondary | ICD-10-CM | POA: Diagnosis not present

## 2012-06-12 DIAGNOSIS — B351 Tinea unguium: Secondary | ICD-10-CM | POA: Diagnosis not present

## 2012-06-15 DIAGNOSIS — M79609 Pain in unspecified limb: Secondary | ICD-10-CM | POA: Diagnosis not present

## 2012-06-19 ENCOUNTER — Encounter: Payer: Self-pay | Admitting: Sports Medicine

## 2012-06-19 ENCOUNTER — Ambulatory Visit (INDEPENDENT_AMBULATORY_CARE_PROVIDER_SITE_OTHER): Payer: Medicare Other | Admitting: Sports Medicine

## 2012-06-19 VITALS — BP 115/73 | HR 76 | Ht 67.0 in

## 2012-06-19 DIAGNOSIS — M629 Disorder of muscle, unspecified: Secondary | ICD-10-CM | POA: Diagnosis not present

## 2012-06-19 DIAGNOSIS — M6289 Other specified disorders of muscle: Secondary | ICD-10-CM

## 2012-06-19 NOTE — Patient Instructions (Addendum)
Dercum's disease (adiposis dolorosa)  Try ACE wrap on for about 15 minutes at a time so you can keep some pressure on that area of fascial herniation (between vastus lateralis and iliotibial band).   Exercises: Continue lateral leg lifts

## 2012-06-19 NOTE — Assessment & Plan Note (Addendum)
She presents with left lateral leg fascial herniation of vastus lateralis and ITB. Her significant weight loss and history of what appears to be Dercum's syndrome likely contributed.  We tried thigh compression band, however, she became nauseous after wearing it for about a minute (possibly secondary to low ejection fraction as she has also noted this when she is drug use compression stockings). So, ACE bandaging a few times a day to help compress herniation was recommended along with ITB stretching exercises. She is to do the Ace bandage with very light compression.  I suspect the sharp pain was related to a temporary entrapment of the femoral cutaneous nerve  Should she get recurrence of the nerve related pain we may need to use medications to block radicular symptoms  At this time I reassured her that this did not look to be a cancer or more serious injury and could be followed

## 2012-06-19 NOTE — Progress Notes (Signed)
  Subjective:    Patient ID: Sara Walker, female    DOB: Dec 04, 1950, 61 y.o.   MRN: [id number]  HPI She is here for new consultation. She was sent by Dr. Shelia Media due to concern for mass on left leg.  The patient reports having had the mass for 20+ years. She was told it was a lipoma. She thinks the mass is getting bigger. About a week ago, she experienced sudden, tear-wrenching pain on that side. The pain radiated behind her knee and to her proximal calf. It improved after about a day and has not hurt her since then.  She denies numbness.  Now is back at baseline. No swelling in knee.  Medical hx - she has extensive CV problems and DM as noted  multiple lipomas over both arms became very painful during the time of her carcinoma. She was diagnosed at one time with Adiposa dolorosa (Dercum's disease).   Review of Systems  Allergies, medication, past medical history reviewed.  She used to weight 400+ pounds and has lost about 200 pounds over the past few years.  She has a history of multiple lipomas and has several on her forearms. They used to become irritated during gout flares. Her gout and irritation has since improved since she started a vegan diet.  Cardiomyopathy with severe heart failure with pacemaker and defibrillator History of breast cancer s/p treatment and in remission     Objective:   Physical Exam GEN: NAD  Both upper extremities shows numerous nodular freely movable soft tissue mass is consistent with lipomas LEFT THIGH:    Lumpy 3-4 cm wide and 7 cm long mass lateral thigh This is just above the iliotibial band and along the margin of the vastus lateralis   Strength: 5/5 abduction Quadriceps strength is normal Hamstring strength is normal and nontender  KNEE: no effusion, no tenderness, good ROM, 5/5 strength   Ultrasound: left lateral thigh  Herniation of heterogeneous suspected fatty mass between vastus lateralis and ITB The soft tissue mass appears to be of  the same composition as other subcutaneous fat There is a suspected area of disruption of the fascial plane Doppler activity is normal    Assessment & Plan:

## 2012-07-10 DIAGNOSIS — E1169 Type 2 diabetes mellitus with other specified complication: Secondary | ICD-10-CM | POA: Diagnosis not present

## 2012-07-10 DIAGNOSIS — E7800 Pure hypercholesterolemia, unspecified: Secondary | ICD-10-CM | POA: Diagnosis not present

## 2012-07-16 DIAGNOSIS — E782 Mixed hyperlipidemia: Secondary | ICD-10-CM | POA: Diagnosis not present

## 2012-07-16 DIAGNOSIS — E1169 Type 2 diabetes mellitus with other specified complication: Secondary | ICD-10-CM | POA: Diagnosis not present

## 2012-07-16 DIAGNOSIS — I10 Essential (primary) hypertension: Secondary | ICD-10-CM | POA: Diagnosis not present

## 2012-07-16 DIAGNOSIS — E1149 Type 2 diabetes mellitus with other diabetic neurological complication: Secondary | ICD-10-CM | POA: Diagnosis not present

## 2012-08-13 DIAGNOSIS — I509 Heart failure, unspecified: Secondary | ICD-10-CM | POA: Diagnosis not present

## 2012-08-16 DIAGNOSIS — L600 Ingrowing nail: Secondary | ICD-10-CM | POA: Diagnosis not present

## 2012-08-16 DIAGNOSIS — E1149 Type 2 diabetes mellitus with other diabetic neurological complication: Secondary | ICD-10-CM | POA: Diagnosis not present

## 2012-08-16 DIAGNOSIS — B351 Tinea unguium: Secondary | ICD-10-CM | POA: Diagnosis not present

## 2012-08-16 DIAGNOSIS — M2010 Hallux valgus (acquired), unspecified foot: Secondary | ICD-10-CM | POA: Diagnosis not present

## 2012-10-17 DIAGNOSIS — E7800 Pure hypercholesterolemia, unspecified: Secondary | ICD-10-CM | POA: Diagnosis not present

## 2012-10-22 DIAGNOSIS — L600 Ingrowing nail: Secondary | ICD-10-CM | POA: Diagnosis not present

## 2012-10-22 DIAGNOSIS — M2040 Other hammer toe(s) (acquired), unspecified foot: Secondary | ICD-10-CM | POA: Diagnosis not present

## 2012-10-22 DIAGNOSIS — B351 Tinea unguium: Secondary | ICD-10-CM | POA: Diagnosis not present

## 2012-10-22 DIAGNOSIS — E1149 Type 2 diabetes mellitus with other diabetic neurological complication: Secondary | ICD-10-CM | POA: Diagnosis not present

## 2012-10-22 DIAGNOSIS — M2010 Hallux valgus (acquired), unspecified foot: Secondary | ICD-10-CM | POA: Diagnosis not present

## 2012-10-23 DIAGNOSIS — E782 Mixed hyperlipidemia: Secondary | ICD-10-CM | POA: Diagnosis not present

## 2012-10-23 DIAGNOSIS — E1149 Type 2 diabetes mellitus with other diabetic neurological complication: Secondary | ICD-10-CM | POA: Diagnosis not present

## 2012-10-23 DIAGNOSIS — I10 Essential (primary) hypertension: Secondary | ICD-10-CM | POA: Diagnosis not present

## 2012-11-22 DIAGNOSIS — E1169 Type 2 diabetes mellitus with other specified complication: Secondary | ICD-10-CM | POA: Diagnosis not present

## 2012-11-22 DIAGNOSIS — E782 Mixed hyperlipidemia: Secondary | ICD-10-CM | POA: Diagnosis not present

## 2012-11-22 DIAGNOSIS — I10 Essential (primary) hypertension: Secondary | ICD-10-CM | POA: Diagnosis not present

## 2012-12-17 ENCOUNTER — Other Ambulatory Visit: Payer: Self-pay | Admitting: Cardiovascular Disease

## 2012-12-19 ENCOUNTER — Telehealth: Payer: Self-pay | Admitting: Internal Medicine

## 2012-12-19 NOTE — Telephone Encounter (Signed)
This is clearly a device clinic question, I will forward to them.

## 2012-12-19 NOTE — Telephone Encounter (Signed)
Per pt ppm is followed by [facility].

## 2012-12-19 NOTE — Telephone Encounter (Signed)
New Prob     Pt wants to know if her follow up appt to get her pacer check is really necessary. She states she has a primary cardiologist in a different office and he does her pacer checks. Please call.

## 2012-12-24 DIAGNOSIS — I10 Essential (primary) hypertension: Secondary | ICD-10-CM | POA: Diagnosis not present

## 2012-12-24 DIAGNOSIS — E1169 Type 2 diabetes mellitus with other specified complication: Secondary | ICD-10-CM | POA: Diagnosis not present

## 2012-12-24 DIAGNOSIS — E782 Mixed hyperlipidemia: Secondary | ICD-10-CM | POA: Diagnosis not present

## 2012-12-31 DIAGNOSIS — B351 Tinea unguium: Secondary | ICD-10-CM | POA: Diagnosis not present

## 2012-12-31 DIAGNOSIS — E1149 Type 2 diabetes mellitus with other diabetic neurological complication: Secondary | ICD-10-CM | POA: Diagnosis not present

## 2012-12-31 DIAGNOSIS — M2040 Other hammer toe(s) (acquired), unspecified foot: Secondary | ICD-10-CM | POA: Diagnosis not present

## 2012-12-31 DIAGNOSIS — L600 Ingrowing nail: Secondary | ICD-10-CM | POA: Diagnosis not present

## 2012-12-31 DIAGNOSIS — M2010 Hallux valgus (acquired), unspecified foot: Secondary | ICD-10-CM | POA: Diagnosis not present

## 2013-01-17 DIAGNOSIS — E7800 Pure hypercholesterolemia, unspecified: Secondary | ICD-10-CM | POA: Diagnosis not present

## 2013-01-17 DIAGNOSIS — M1A00X0 Idiopathic chronic gout, unspecified site, without tophus (tophi): Secondary | ICD-10-CM | POA: Diagnosis not present

## 2013-01-22 DIAGNOSIS — I10 Essential (primary) hypertension: Secondary | ICD-10-CM | POA: Diagnosis not present

## 2013-01-22 DIAGNOSIS — Z006 Encounter for examination for normal comparison and control in clinical research program: Secondary | ICD-10-CM | POA: Diagnosis not present

## 2013-01-22 DIAGNOSIS — F329 Major depressive disorder, single episode, unspecified: Secondary | ICD-10-CM | POA: Diagnosis not present

## 2013-01-22 DIAGNOSIS — E1165 Type 2 diabetes mellitus with hyperglycemia: Secondary | ICD-10-CM | POA: Diagnosis not present

## 2013-01-22 DIAGNOSIS — E1169 Type 2 diabetes mellitus with other specified complication: Secondary | ICD-10-CM | POA: Diagnosis not present

## 2013-01-22 DIAGNOSIS — E782 Mixed hyperlipidemia: Secondary | ICD-10-CM | POA: Diagnosis not present

## 2013-02-21 DIAGNOSIS — E782 Mixed hyperlipidemia: Secondary | ICD-10-CM | POA: Diagnosis not present

## 2013-02-21 DIAGNOSIS — I10 Essential (primary) hypertension: Secondary | ICD-10-CM | POA: Diagnosis not present

## 2013-02-21 DIAGNOSIS — E1165 Type 2 diabetes mellitus with hyperglycemia: Secondary | ICD-10-CM | POA: Diagnosis not present

## 2013-02-28 ENCOUNTER — Encounter: Payer: Self-pay | Admitting: Cardiovascular Disease

## 2013-02-28 ENCOUNTER — Encounter: Payer: Self-pay | Admitting: *Deleted

## 2013-03-01 ENCOUNTER — Ambulatory Visit (INDEPENDENT_AMBULATORY_CARE_PROVIDER_SITE_OTHER): Payer: Medicare Other | Admitting: Cardiovascular Disease

## 2013-03-01 ENCOUNTER — Encounter: Payer: Self-pay | Admitting: Cardiovascular Disease

## 2013-03-01 VITALS — BP 120/62 | HR 81 | Resp 20 | Ht 67.75 in | Wt 255.5 lb

## 2013-03-01 DIAGNOSIS — I447 Left bundle-branch block, unspecified: Secondary | ICD-10-CM

## 2013-03-01 DIAGNOSIS — Z95810 Presence of automatic (implantable) cardiac defibrillator: Secondary | ICD-10-CM

## 2013-03-01 DIAGNOSIS — L821 Other seborrheic keratosis: Secondary | ICD-10-CM | POA: Diagnosis not present

## 2013-03-01 DIAGNOSIS — I5022 Chronic systolic (congestive) heart failure: Secondary | ICD-10-CM

## 2013-03-01 DIAGNOSIS — L910 Hypertrophic scar: Secondary | ICD-10-CM | POA: Diagnosis not present

## 2013-03-01 DIAGNOSIS — Z5189 Encounter for other specified aftercare: Secondary | ICD-10-CM

## 2013-03-01 DIAGNOSIS — I2510 Atherosclerotic heart disease of native coronary artery without angina pectoris: Secondary | ICD-10-CM | POA: Diagnosis not present

## 2013-03-01 DIAGNOSIS — D2370 Other benign neoplasm of skin of unspecified lower limb, including hip: Secondary | ICD-10-CM | POA: Diagnosis not present

## 2013-03-01 DIAGNOSIS — I10 Essential (primary) hypertension: Secondary | ICD-10-CM

## 2013-03-01 LAB — ICD DEVICE OBSERVATION
AL AMPLITUDE: 2.7 mv
BAMS-0001: 170 {beats}/min
BAMS-0003: 70 {beats}/min
CHARGE TIME: 9.5 s
DEV-0020ICD: NEGATIVE
FVT: 0
HV IMPEDENCE: 79 Ohm
LV LEAD THRESHOLD: 2.25 V
MODE SWITCH EPISODES: 0
PACEART VT: 0
RV LEAD IMPEDENCE ICD: 540 Ohm
RV LEAD THRESHOLD: 0.75 V
TZAT-0004SLOWVT: 8
TZAT-0012SLOWVT: 200 ms
TZAT-0013SLOWVT: 3
TZAT-0018SLOWVT: NEGATIVE
TZON-0003SLOWVT: 300 ms
TZST-0001SLOWVT: 2
TZST-0001SLOWVT: 3
TZST-0001SLOWVT: 5
TZST-0003SLOWVT: 40 J
VF: 0

## 2013-03-01 NOTE — Progress Notes (Signed)
In office BiV ICD interrogation. Normal device function. No changes made this session.

## 2013-03-01 NOTE — Patient Instructions (Addendum)
Please send in a manual transmission from your ICD with your Merlin monitor. Call [phone] once you have completed the transmission.   You can expect to receive letters informing you that your transmissions were successfully received, and when you can expect your next automatic transmission.   If your power goes out in your home you need to complete the process of resetting your transmitter.   Your physician recommends that you schedule a follow-up appointment in: 6 months

## 2013-03-07 IMAGING — CR DG CHEST 1V PORT
1 series · 1 of 1 positions shown · non-contrast
Comparison: 12/12/2011.

CLINICAL DATA: Pacemaker insertion.

PORTABLE CHEST - 1 VIEW

[AP]
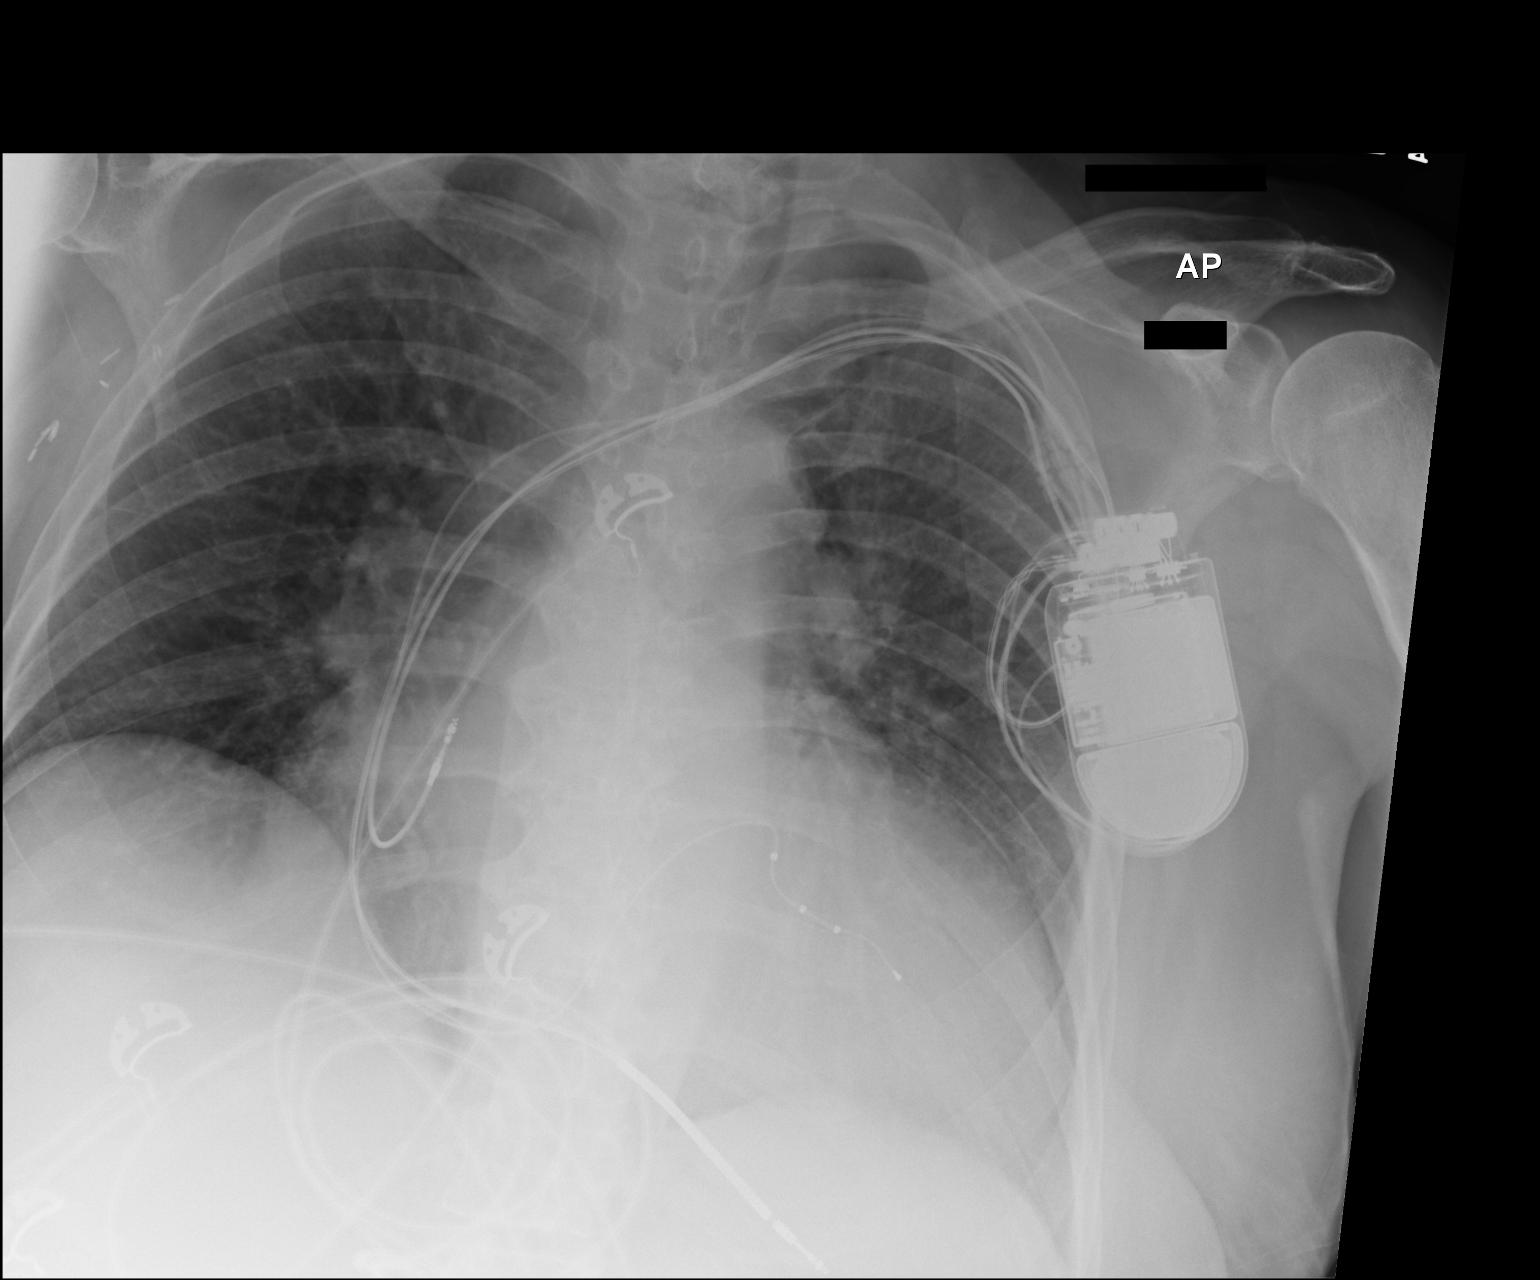

[1 of 1 positions shown; findings below may reference images not displayed]

FINDINGS: The permanent left-sided pacemaker and pacer wires are in
good position.  There are right atrial, right ventricular and
coronary sinus wires.  No complicating features.  Stable cardiac
enlargement.  Low lung volumes with mild vascular crowding and
areas of atelectasis.
IMPRESSION: Pacer wires in good position without complicating features.

## 2013-03-11 ENCOUNTER — Encounter: Payer: Self-pay | Admitting: Cardiovascular Disease

## 2013-03-11 NOTE — Assessment & Plan Note (Signed)
(  Anthracycline)

## 2013-03-11 NOTE — Progress Notes (Signed)
Patient ID: Sara Walker, female   DOB: 03/05/1951, 62 y.o.   MRN: [id number]      Reason for office visit CHF followup Sara Walker has a nonischemic dilated cardiomyopathy with an ejection fraction estimated the 20-25%, a consequence of anthracycline chemotherapy for breast cancer. She has improved following implantation of a dual-chamber biventricular pacemaker defibrillator and has NYHA functional class II. And fourthly she continues to gain weight at an alarming rate. Her weight is now 16 pounds more than it was in February and more than 70 pounds over her weight a year ago. She states that she is a compulsive eater, with periods of depression when she overeats severely.  Her shortness of breath has worsened with a weight gain as has her diabetes control. She has not had lower extremity edema.  Allergies  Allergen Reactions  . Lasix [Furosemide]     Causes gout  . Ace Inhibitors Cough  . Codeine     Crazy  . Digitalis Other (See Comments)    "saw yellow circles"  . Levaquin [Levofloxacin In D5w]     Gout   . Other     Nitro spray  . Statins     Current Outpatient Prescriptions  Medication Sig Dispense Refill  . allopurinol (ZYLOPRIM) 100 MG tablet Take 100 mg by mouth daily.       . carvedilol (COREG CR) 40 MG 24 hr capsule Take 40 mg by mouth daily.      . colchicine 0.6 MG tablet Take 0.6 mg by mouth daily.      Marland Kitchen LANTUS SOLOSTAR 100 UNIT/ML SOPN 33 Units daily.      . Multiple Vitamins-Minerals (MULTIVITAMINS THER. W/MINERALS) TABS Take 1 tablet by mouth daily.      Marland Kitchen spironolactone (ALDACTONE) 50 MG tablet Take 50 mg by mouth daily.      Marland Kitchen torsemide (DEMADEX) 100 MG tablet Take 1/2 tablet by mouth twice daily       No current facility-administered medications for this visit.    Past Medical History  Diagnosis Date  . Goiter   . DM2 (diabetes mellitus, type 2)   . Chronic systolic heart failure   . Invasive ductal carcinoma of breast   . Gout   . Hyperlipidemia    . Hypertension   . Breast cancer   . LBBB (left bundle branch block)     evidnece of RBBB fall 2012  . Cardiomyopathy secondary to chemotherapy     a.  doxurubicin;  b. s/p SJM Quadra Assura CRT-D, model [id number] Q, Ser# [id number]  . Syncope   . PONV (postoperative nausea and vomiting)     nausea post ICD placement relieved with Zofran  . Coronary artery disease   . Angina   . CHF (congestive heart failure)   . Shortness of breath   . Nonischemic cardiomyopathy     anthracycline-related    Past Surgical History  Procedure Laterality Date  . Mastectomy      right  . Tunneled venous catheter placement      removed  . Breast surgery      Left mastectomy  . Cardiac defibrillator placement      St.Jude Quadra  . Cesarean section  [place]  . Dilation and curettage of uterus  1975  . Portacath placement  1991  . Foot surgery  1994  . Nm myocar perf wall motion  12/01/2011    no ischemia; EF 22%    Family History  Problem Relation  Age of Onset  . Heart attack Father   . Hypertension Brother   . Diabetes Brother     History   Social History  . Marital Status: Divorced    Spouse Name: N/A    Number of Children: N/A  . Years of Education: N/A   Occupational History  . Not on file.   Social History Main Topics  . Smoking status: Former Research scientist (life sciences)  . Smokeless tobacco: Never Used  . Alcohol Use: No  . Drug Use: No  . Sexual Activity: Not Currently   Other Topics Concern  . Not on file   Social History Narrative  . No narrative on file    Review of systems: The patient specifically denies any chest pain at rest or with exertion, dyspnea with exertion, orthopnea, paroxysmal nocturnal dyspnea, syncope, palpitations, focal neurological deficits, intermittent claudication, cough, hemoptysis or wheezing.  The patient also denies abdominal pain, nausea, vomiting, dysphagia, diarrhea, constipation, polyuria, polydipsia, dysuria, hematuria, frequency, urgency, abnormal  bleeding or bruising, fever, chills, unexpected weight changes, mood swings, change in skin or hair texture, change in voice quality, auditory or visual problems, allergic reactions or rashes, new musculoskeletal complaints other than usual "aches and pains".   PHYSICAL EXAM BP 120/62  Pulse 81  Resp 20  Ht 5' 7.75" (1.721 m)  Wt 255 lb 8 oz (115.894 kg)  BMI 39.13 kg/m2  General: Alert, oriented x3, no distress Head: no evidence of trauma, PERRL, EOMI, no exophtalmos or lid lag, no myxedema, no xanthelasma; normal ears, nose and oropharynx Neck: normal jugular venous pulsations and no hepatojugular reflux; brisk carotid pulses without delay and no carotid bruits Chest: clear to auscultation, no signs of consolidation by percussion or palpation, normal fremitus, symmetrical and full respiratory excursions Cardiovascular: Heart to define the location of the apical impulse, regular rhythm, normal first and second heart sounds, no murmurs, rubs or gallops Abdomen: no tenderness or distention, no masses by palpation, no abnormal pulsatility or arterial bruits, normal bowel sounds, no hepatosplenomegaly Extremities: no clubbing, cyanosis or edema; 2+ radial, ulnar and brachial pulses bilaterally; 2+ right femoral, posterior tibial and dorsalis pedis pulses; 2+ left femoral, posterior tibial and dorsalis pedis pulses; no subclavian or femoral bruits Neurological: grossly nonfocal   EKG:  atrial sensed biventricular paced  Lipid Panel  No results found for this basename: chol, trig, hdl, cholhdl, vldl, ldlcalc    BMET    Component Value Date/Time   NA 135 12/12/2011 0631   K 4.1 12/12/2011 0631   CL 98 12/12/2011 0631   CO2 23 12/12/2011 0631   GLUCOSE 160* 12/12/2011 0631   BUN 31* 12/12/2011 0631   CREATININE 0.85 12/12/2011 0631   CALCIUM 9.6 12/12/2011 0631   GFRNONAA 73* 12/12/2011 0631   GFRAA 85* 12/12/2011 0631     ASSESSMENT AND PLAN Biventricular ICD (implantable  cardioverter-defibrillator) in place St. Jude CRT-the device with good function. All 3 leads have satisfactory parameters. LV threshold is 2.25 V at 0.5 ms in the M3 to RV coil configuration. There have been no episodes of atrial or ventricular tachyarrhythmia. There is 98% biventricular pacing efficiency. There is no atrial pacing. Her course view suggests a tilt towards hypervolemia over the last 7-10 days.  Chronic systolic heart failure She appears to be mildly hypervolemic today. She has NYHA functional class II, short of breath walking up an incline. She is advised that she may have to increase her dose of torsemide temporarily. At this point in time I  am uncertain about her dry weight, except it is lower than it is today. She has gained some shoe weight and this has also led to development of worsening glycemic control. It is hard to distinguish how much of her weight gain is fluid  Cardiomyopathy secondary to chemotherapy (Anthracycline)  Hypertension Great control on carvedilol, Spironolactone and the loop diuretic. Intolerant to ACE inhibitors and angiotensin receptor blockers secondary to cough   Orders Placed This Encounter  Procedures  . ICD Device Observation  . EKG 12-Lead   Meds ordered this encounter  Medications  . LANTUS SOLOSTAR 100 UNIT/ML SOPN    Sig: 33 Units daily.  Marland Kitchen spironolactone (ALDACTONE) 50 MG tablet    Sig: Take 50 mg by mouth daily.    Sara Humbles, MD, [place] [facility] [place] [phone] office [phone] pager

## 2013-03-11 NOTE — Assessment & Plan Note (Signed)
Great control on carvedilol, Spironolactone and the loop diuretic. Intolerant to ACE inhibitors and angiotensin receptor blockers secondary to cough

## 2013-03-11 NOTE — Assessment & Plan Note (Signed)
She appears to be mildly hypervolemic today. She has NYHA functional class II, short of breath walking up an incline. She is advised that she may have to increase her dose of torsemide temporarily. At this point in time I am uncertain about her dry weight, except it is lower than it is today. She has gained some shoe weight and this has also led to development of worsening glycemic control. It is hard to distinguish how much of her weight gain is fluid

## 2013-03-11 NOTE — Assessment & Plan Note (Signed)
St. Jude CRT-the device with good function. All 3 leads have satisfactory parameters. LV threshold is 2.25 V at 0.5 ms in the M3 to RV coil configuration. There have been no episodes of atrial or ventricular tachyarrhythmia. There is 98% biventricular pacing efficiency. There is no atrial pacing. Her course view suggests a tilt towards hypervolemia over the last 7-10 days.

## 2013-03-12 DIAGNOSIS — E1149 Type 2 diabetes mellitus with other diabetic neurological complication: Secondary | ICD-10-CM | POA: Diagnosis not present

## 2013-03-12 DIAGNOSIS — B351 Tinea unguium: Secondary | ICD-10-CM | POA: Diagnosis not present

## 2013-03-12 DIAGNOSIS — L600 Ingrowing nail: Secondary | ICD-10-CM | POA: Diagnosis not present

## 2013-03-13 DIAGNOSIS — M1A00X0 Idiopathic chronic gout, unspecified site, without tophus (tophi): Secondary | ICD-10-CM | POA: Diagnosis not present

## 2013-03-14 DIAGNOSIS — E1169 Type 2 diabetes mellitus with other specified complication: Secondary | ICD-10-CM | POA: Diagnosis not present

## 2013-03-14 DIAGNOSIS — E049 Nontoxic goiter, unspecified: Secondary | ICD-10-CM | POA: Diagnosis not present

## 2013-03-20 DIAGNOSIS — E049 Nontoxic goiter, unspecified: Secondary | ICD-10-CM | POA: Diagnosis not present

## 2013-03-20 DIAGNOSIS — IMO0001 Reserved for inherently not codable concepts without codable children: Secondary | ICD-10-CM | POA: Diagnosis not present

## 2013-03-20 DIAGNOSIS — E7800 Pure hypercholesterolemia, unspecified: Secondary | ICD-10-CM | POA: Diagnosis not present

## 2013-03-25 DIAGNOSIS — I10 Essential (primary) hypertension: Secondary | ICD-10-CM | POA: Diagnosis not present

## 2013-03-25 DIAGNOSIS — I5022 Chronic systolic (congestive) heart failure: Secondary | ICD-10-CM | POA: Diagnosis not present

## 2013-03-25 DIAGNOSIS — E1165 Type 2 diabetes mellitus with hyperglycemia: Secondary | ICD-10-CM | POA: Diagnosis not present

## 2013-03-25 DIAGNOSIS — E782 Mixed hyperlipidemia: Secondary | ICD-10-CM | POA: Diagnosis not present

## 2013-04-17 DIAGNOSIS — M1A00X0 Idiopathic chronic gout, unspecified site, without tophus (tophi): Secondary | ICD-10-CM | POA: Diagnosis not present

## 2013-04-22 DIAGNOSIS — E782 Mixed hyperlipidemia: Secondary | ICD-10-CM | POA: Diagnosis not present

## 2013-04-22 DIAGNOSIS — E1165 Type 2 diabetes mellitus with hyperglycemia: Secondary | ICD-10-CM | POA: Diagnosis not present

## 2013-04-22 DIAGNOSIS — I10 Essential (primary) hypertension: Secondary | ICD-10-CM | POA: Diagnosis not present

## 2013-05-20 DIAGNOSIS — M2040 Other hammer toe(s) (acquired), unspecified foot: Secondary | ICD-10-CM | POA: Diagnosis not present

## 2013-05-20 DIAGNOSIS — M2010 Hallux valgus (acquired), unspecified foot: Secondary | ICD-10-CM | POA: Diagnosis not present

## 2013-05-20 DIAGNOSIS — B351 Tinea unguium: Secondary | ICD-10-CM | POA: Diagnosis not present

## 2013-05-20 DIAGNOSIS — E1149 Type 2 diabetes mellitus with other diabetic neurological complication: Secondary | ICD-10-CM | POA: Diagnosis not present

## 2013-05-20 DIAGNOSIS — L600 Ingrowing nail: Secondary | ICD-10-CM | POA: Diagnosis not present

## 2013-05-30 DIAGNOSIS — Z0000 Encounter for general adult medical examination without abnormal findings: Secondary | ICD-10-CM | POA: Diagnosis not present

## 2013-05-30 DIAGNOSIS — M1A00X0 Idiopathic chronic gout, unspecified site, without tophus (tophi): Secondary | ICD-10-CM | POA: Diagnosis not present

## 2013-06-02 LAB — ICD DEVICE OBSERVATION

## 2013-06-03 ENCOUNTER — Ambulatory Visit: Payer: Medicare Other

## 2013-06-10 ENCOUNTER — Other Ambulatory Visit: Payer: Self-pay | Admitting: Cardiovascular Disease

## 2013-06-10 NOTE — Telephone Encounter (Signed)
Called to clarify dosaging of Spironolactone. Per pharmacy - 25mg  BID, per office visit note - 50mg  QD.

## 2013-06-11 ENCOUNTER — Other Ambulatory Visit: Payer: Self-pay | Admitting: Cardiovascular Disease

## 2013-06-14 ENCOUNTER — Encounter: Payer: Self-pay | Admitting: *Deleted

## 2013-06-14 LAB — MDC_IDC_ENUM_SESS_TYPE_REMOTE
HighPow Impedance: 87 Ohm
Implantable Pulse Generator Model: 3265
Implantable Pulse Generator Serial Number: 7036093
Lead Channel Impedance Value: 530 Ohm
Lead Channel Impedance Value: 710 Ohm
Lead Channel Pacing Threshold Amplitude: 0.75 V
Lead Channel Pacing Threshold Amplitude: 2.625 V
Lead Channel Pacing Threshold Pulse Width: 0.5 ms
Lead Channel Setting Pacing Amplitude: 1.5 V
Lead Channel Setting Pacing Amplitude: 3.625
Zone Setting Detection Interval: 300 ms

## 2013-06-17 ENCOUNTER — Telehealth: Payer: Self-pay | Admitting: *Deleted

## 2013-06-17 MED ORDER — SPIRONOLACTONE 50 MG PO TABS: 50.0000 mg | ORAL_TABLET | Freq: Every day | ORAL | Status: DC

## 2013-06-17 NOTE — Telephone Encounter (Signed)
Patient returned my call from Friday. I originally called patient regarding the decline in her thoracic impedance (corvue measurement). Patient admitted to DOE x 2 months and chronic bloating, but denied orthopnea or edema. Patient states that she may have eaten more sodium than she should have lately and states that she did not feel that she needed to see a physician. I advised patient to decrease sodium intake and continue with same meds. Examples of sodium rich foods given. Patient voiced understanding. Dr.Croitoru verbally agreed with plan.  Patient also requested a refill on her Aldactone rx. Sent in to pharmacy.

## 2013-06-19 DIAGNOSIS — IMO0001 Reserved for inherently not codable concepts without codable children: Secondary | ICD-10-CM | POA: Diagnosis not present

## 2013-06-19 DIAGNOSIS — E7800 Pure hypercholesterolemia, unspecified: Secondary | ICD-10-CM | POA: Diagnosis not present

## 2013-06-24 DIAGNOSIS — I10 Essential (primary) hypertension: Secondary | ICD-10-CM | POA: Diagnosis not present

## 2013-06-24 DIAGNOSIS — E1165 Type 2 diabetes mellitus with hyperglycemia: Secondary | ICD-10-CM | POA: Diagnosis not present

## 2013-06-24 DIAGNOSIS — E782 Mixed hyperlipidemia: Secondary | ICD-10-CM | POA: Diagnosis not present

## 2013-07-02 ENCOUNTER — Ambulatory Visit (INDEPENDENT_AMBULATORY_CARE_PROVIDER_SITE_OTHER): Payer: Medicare Other | Admitting: *Deleted

## 2013-07-02 DIAGNOSIS — I427 Cardiomyopathy due to drug and external agent: Secondary | ICD-10-CM

## 2013-07-02 DIAGNOSIS — I5022 Chronic systolic (congestive) heart failure: Secondary | ICD-10-CM | POA: Diagnosis not present

## 2013-07-02 DIAGNOSIS — I429 Cardiomyopathy, unspecified: Secondary | ICD-10-CM | POA: Diagnosis not present

## 2013-07-02 DIAGNOSIS — I447 Left bundle-branch block, unspecified: Secondary | ICD-10-CM | POA: Diagnosis not present

## 2013-07-02 LAB — MDC_IDC_ENUM_SESS_TYPE_REMOTE
Battery Remaining Longevity: 4.9
Brady Statistic RA Percent Paced: 1 % — CL
HIGH POWER IMPEDANCE MEASURED VALUE: 90 Ohm
Implantable Pulse Generator Model: 3265
Lead Channel Impedance Value: 360 Ohm
Lead Channel Impedance Value: 550 Ohm
Lead Channel Impedance Value: 780 Ohm
Lead Channel Pacing Threshold Amplitude: 0.875 V
Lead Channel Sensing Intrinsic Amplitude: 3.1 mV
Lead Channel Setting Pacing Amplitude: 1.875
Lead Channel Setting Pacing Amplitude: 2 V
Lead Channel Setting Pacing Amplitude: 3.625
Lead Channel Setting Pacing Pulse Width: 0.5 ms
Lead Channel Setting Pacing Pulse Width: 0.5 ms
MDC IDC MSMT LEADCHNL LV PACING THRESHOLD AMPLITUDE: 2.875 V
MDC IDC MSMT LEADCHNL LV PACING THRESHOLD PULSEWIDTH: 0.5 ms
MDC IDC MSMT LEADCHNL RA PACING THRESHOLD PULSEWIDTH: 0.5 ms
MDC IDC MSMT LEADCHNL RV PACING THRESHOLD AMPLITUDE: 0.625 V
MDC IDC MSMT LEADCHNL RV PACING THRESHOLD PULSEWIDTH: 0.4 ms
MDC IDC PG SERIAL: 7036093
MDC IDC SET LEADCHNL RV SENSING SENSITIVITY: 0.5 mV
MDC IDC SET ZONE DETECTION INTERVAL: 240 ms
MDC IDC SET ZONE DETECTION INTERVAL: 352.9 ms
MDC IDC STAT BRADY RV PERCENT PACED: 99 %
Zone Setting Detection Interval: 300 ms

## 2013-07-02 LAB — ICD DEVICE OBSERVATION

## 2013-07-19 ENCOUNTER — Encounter: Payer: Self-pay | Admitting: *Deleted

## 2013-08-05 ENCOUNTER — Ambulatory Visit (INDEPENDENT_AMBULATORY_CARE_PROVIDER_SITE_OTHER): Payer: Medicare Other | Admitting: *Deleted

## 2013-08-05 DIAGNOSIS — I5022 Chronic systolic (congestive) heart failure: Secondary | ICD-10-CM | POA: Diagnosis not present

## 2013-08-05 LAB — MDC_IDC_ENUM_SESS_TYPE_REMOTE
Battery Remaining Longevity: 57 mo
Brady Statistic AP VP Percent: 1 %
Brady Statistic AS VS Percent: 1 %
Date Time Interrogation Session: 20150126084459
HIGH POWER IMPEDANCE MEASURED VALUE: 84 Ohm
HighPow Impedance: 84 Ohm
Implantable Pulse Generator Serial Number: 7036093
Lead Channel Impedance Value: 410 Ohm
Lead Channel Pacing Threshold Amplitude: 0.75 V
Lead Channel Pacing Threshold Pulse Width: 0.5 ms
Lead Channel Setting Pacing Amplitude: 2 V
Lead Channel Setting Pacing Pulse Width: 0.5 ms
Lead Channel Setting Pacing Pulse Width: 0.5 ms
MDC IDC MSMT BATTERY REMAINING PERCENTAGE: 76 %
MDC IDC MSMT BATTERY VOLTAGE: 2.96 V
MDC IDC MSMT LEADCHNL LV IMPEDANCE VALUE: 630 Ohm
MDC IDC MSMT LEADCHNL LV PACING THRESHOLD AMPLITUDE: 3 V
MDC IDC MSMT LEADCHNL LV PACING THRESHOLD PULSEWIDTH: 0.5 ms
MDC IDC MSMT LEADCHNL RA SENSING INTR AMPL: 1.6 mV
MDC IDC MSMT LEADCHNL RV IMPEDANCE VALUE: 530 Ohm
MDC IDC MSMT LEADCHNL RV PACING THRESHOLD AMPLITUDE: 0.625 V
MDC IDC MSMT LEADCHNL RV PACING THRESHOLD PULSEWIDTH: 0.5 ms
MDC IDC MSMT LEADCHNL RV SENSING INTR AMPL: 12 mV
MDC IDC SET LEADCHNL LV PACING AMPLITUDE: 4 V
MDC IDC SET LEADCHNL RA PACING AMPLITUDE: 1.75 V
MDC IDC SET LEADCHNL RV SENSING SENSITIVITY: 0.5 mV
MDC IDC SET ZONE DETECTION INTERVAL: 300 ms
MDC IDC STAT BRADY AP VS PERCENT: 1 %
MDC IDC STAT BRADY AS VP PERCENT: 99 %
MDC IDC STAT BRADY RA PERCENT PACED: 1 %
Zone Setting Detection Interval: 250 ms

## 2013-08-05 LAB — ICD DEVICE OBSERVATION

## 2013-08-16 ENCOUNTER — Encounter: Payer: Medicare Other | Admitting: Cardiovascular Disease

## 2013-08-20 ENCOUNTER — Ambulatory Visit
Admission: RE | Admit: 2013-08-20 | Discharge: 2013-08-20 | Disposition: A | Discharge status: Home / Self Care | Payer: Medicare Other | Source: Ambulatory Visit | Attending: Cardiovascular Disease | Admitting: Cardiovascular Disease

## 2013-08-20 ENCOUNTER — Encounter: Payer: Self-pay | Admitting: Cardiovascular Disease

## 2013-08-20 ENCOUNTER — Ambulatory Visit (INDEPENDENT_AMBULATORY_CARE_PROVIDER_SITE_OTHER): Payer: Medicare Other | Admitting: Cardiovascular Disease

## 2013-08-20 VITALS — BP 98/64 | HR 73 | Resp 24 | Ht 68.0 in | Wt 279.2 lb

## 2013-08-20 DIAGNOSIS — T451X5A Adverse effect of antineoplastic and immunosuppressive drugs, initial encounter: Secondary | ICD-10-CM

## 2013-08-20 DIAGNOSIS — R0602 Shortness of breath: Secondary | ICD-10-CM

## 2013-08-20 DIAGNOSIS — R05 Cough: Secondary | ICD-10-CM | POA: Diagnosis not present

## 2013-08-20 DIAGNOSIS — Z79899 Other long term (current) drug therapy: Secondary | ICD-10-CM | POA: Diagnosis not present

## 2013-08-20 DIAGNOSIS — I427 Cardiomyopathy due to drug and external agent: Secondary | ICD-10-CM

## 2013-08-20 DIAGNOSIS — I5043 Acute on chronic combined systolic (congestive) and diastolic (congestive) heart failure: Secondary | ICD-10-CM

## 2013-08-20 DIAGNOSIS — I509 Heart failure, unspecified: Secondary | ICD-10-CM

## 2013-08-20 DIAGNOSIS — R059 Cough, unspecified: Secondary | ICD-10-CM | POA: Diagnosis not present

## 2013-08-20 DIAGNOSIS — I447 Left bundle-branch block, unspecified: Secondary | ICD-10-CM

## 2013-08-20 DIAGNOSIS — I5023 Acute on chronic systolic (congestive) heart failure: Secondary | ICD-10-CM | POA: Insufficient documentation

## 2013-08-20 DIAGNOSIS — I5022 Chronic systolic (congestive) heart failure: Secondary | ICD-10-CM

## 2013-08-20 DIAGNOSIS — Z95810 Presence of automatic (implantable) cardiac defibrillator: Secondary | ICD-10-CM

## 2013-08-20 DIAGNOSIS — I429 Cardiomyopathy, unspecified: Secondary | ICD-10-CM

## 2013-08-20 LAB — BASIC METABOLIC PANEL
BUN: 21 mg/dL (ref 6–23)
CALCIUM: 9.4 mg/dL (ref 8.4–10.5)
CO2: 31 meq/L (ref 19–32)
Chloride: 92 mEq/L — ABNORMAL LOW (ref 96–112)
Creat: 1.04 mg/dL (ref 0.50–1.10)
Glucose, Bld: 91 mg/dL (ref 70–99)
POTASSIUM: 3.7 meq/L (ref 3.5–5.3)
SODIUM: 134 meq/L — AB (ref 135–145)

## 2013-08-20 LAB — MDC_IDC_ENUM_SESS_TYPE_INCLINIC
Battery Remaining Longevity: 4.6
Brady Statistic RA Percent Paced: 1 % — CL
Brady Statistic RV Percent Paced: 99 %
HIGH POWER IMPEDANCE MEASURED VALUE: 91 Ohm
Lead Channel Impedance Value: 360 Ohm
Lead Channel Impedance Value: 530 Ohm
Lead Channel Pacing Threshold Amplitude: 2.125 V
Lead Channel Pacing Threshold Pulse Width: 0.5 ms
Lead Channel Pacing Threshold Pulse Width: 0.5 ms
Lead Channel Sensing Intrinsic Amplitude: 3 mV
Lead Channel Setting Pacing Amplitude: 1.75 V
Lead Channel Setting Pacing Amplitude: 2 V
Lead Channel Setting Pacing Amplitude: 3.5 V
Lead Channel Setting Pacing Pulse Width: 0.5 ms
Lead Channel Setting Pacing Pulse Width: 0.5 ms
MDC IDC MSMT BATTERY REMAINING PERCENTAGE: 75 %
MDC IDC MSMT LEADCHNL LV IMPEDANCE VALUE: 660 Ohm
MDC IDC MSMT LEADCHNL RA PACING THRESHOLD AMPLITUDE: 0.875 V
MDC IDC MSMT LEADCHNL RV PACING THRESHOLD AMPLITUDE: 0.625 V
MDC IDC MSMT LEADCHNL RV PACING THRESHOLD PULSEWIDTH: 0.5 ms
MDC IDC MSMT LEADCHNL RV SENSING INTR AMPL: 12 mV
MDC IDC PG SERIAL: 7036093
MDC IDC SET LEADCHNL RV SENSING SENSITIVITY: 0.5 mV
MDC IDC SET ZONE DETECTION INTERVAL: 300 ms
Zone Setting Detection Interval: 250 ms

## 2013-08-20 LAB — PACEMAKER DEVICE OBSERVATION

## 2013-08-20 MED ORDER — METOLAZONE 5 MG PO TABS: 5.0000 mg | ORAL_TABLET | Freq: Every day | ORAL | Status: DC

## 2013-08-20 NOTE — Progress Notes (Signed)
Patient ID: Sara Walker, female   DOB: Dec 03, 1950, 63 y.o.   MRN: [id number]     Reason for office visit Dyspnea at rest  Sara Walker is feeling very poorly. She has had incessant coughing that is nonproductive. She has not had fever or chills. She has clear-cut symptoms of orthopnea and is unable to lie in bed. She has had worsening wheezing, which for her is a typical finding when she has heart failure exacerbation.   She has always had a very volatile weight and loses and gains large amounts of real weight. Depression often plays a role in this . She now weighs about 25 pounds more than she did in August 2014 and 60 pounds more than in September 2013 and almost 200 pounds more compared to May of 2013. She believes this is because of the very large doses of insulin she is receiving. She is terrified of low blood sugars and over eats to compensate. She is no longer receiving any oral antidiabetics.  She has severe dilated cardiomyopathy with an ejection fraction of 20-25%, secondary to anthracycline chemotherapy for breast cancer. She has a CRT-D device (St. Jude) implanted in 2013 that appears to be functioning well, with virtually 100% biventricular pacing efficiency. The thoracic impedance has documented a gradual decline consistent with heart failure exacerbation. She is on fairly good doses of carvedilol, aldosterone antagonists but is intolerant to both ACE inhibitors and angiotensin receptor blockers secondary to a persistent cough   Allergies  Allergen Reactions  . Lasix [Furosemide]     Causes gout  . Ace Inhibitors Cough  . Codeine     Crazy  . Digitalis Other (See Comments)    "saw yellow circles"  . Levaquin [Levofloxacin In D5w]     Gout   . Other     Nitro spray  . Statins     Current Outpatient Prescriptions  Medication Sig Dispense Refill  . allopurinol (ZYLOPRIM) 300 MG tablet Take 450 mg once a day      . carvedilol (COREG CR) 40 MG 24 hr capsule Take 40 mg by mouth  daily.      . colchicine 0.6 MG tablet Take 0.6 mg by mouth daily.      Marland Kitchen LANTUS SOLOSTAR 100 UNIT/ML SOPN 63 Units daily.       Marland Kitchen spironolactone (ALDACTONE) 50 MG tablet Take 1 tablet (50 mg total) by mouth daily.  90 tablet  3  . torsemide (DEMADEX) 100 MG tablet Take 1/2 tablet by mouth twice daily      . metolazone (ZAROXOLYN) 5 MG tablet Take 1 tablet (5 mg total) by mouth daily.  10 tablet  0  . metolazone (ZAROXOLYN) 5 MG tablet Take 1 tablet (5 mg total) by mouth daily. Until your weight is 20 lbs less than today's weight on your home scale.  30 tablet  11   No current facility-administered medications for this visit.    Past Medical History  Diagnosis Date  . Goiter   . DM2 (diabetes mellitus, type 2)   . Chronic systolic heart failure   . Invasive ductal carcinoma of breast   . Gout   . Hyperlipidemia   . Hypertension   . Breast cancer   . LBBB (left bundle branch block)     evidnece of RBBB fall 2012  . Cardiomyopathy secondary to chemotherapy     a.  doxurubicin;  b. s/p SJM Quadra Assura CRT-D, model [id number] Q, Ser# [id number]  . Syncope   .  PONV (postoperative nausea and vomiting)     nausea post ICD placement relieved with Zofran  . Coronary artery disease   . Angina   . CHF (congestive heart failure)   . Shortness of breath   . Nonischemic cardiomyopathy     anthracycline-related    Past Surgical History  Procedure Laterality Date  . Mastectomy      right  . Tunneled venous catheter placement      removed  . Breast surgery      Left mastectomy  . Cardiac defibrillator placement      St.Jude Quadra  . Cesarean section  [place]  . Dilation and curettage of uterus  1975  . Portacath placement  1991  . Foot surgery  1994  . Nm myocar perf wall motion  12/01/2011    no ischemia; EF 22%    Family History  Problem Relation Age of Onset  . Heart attack Father   . Hypertension Brother   . Diabetes Brother     History   Social History  . Marital  Status: Divorced    Spouse Name: N/A    Number of Children: N/A  . Years of Education: N/A   Occupational History  . Not on file.   Social History Main Topics  . Smoking status: Former Research scientist (life sciences)  . Smokeless tobacco: Never Used  . Alcohol Use: No  . Drug Use: No  . Sexual Activity: Not Currently   Other Topics Concern  . Not on file   Social History Narrative  . No narrative on file    Review of systems: Dyspnea at rest, orthopnea, paroxysmal nocturnal dyspnea, lower extremity edema, abdominal fullness The patient specifically denies any chest pain at rest or with exertion, syncope, palpitations, focal neurological deficits, intermittent claudication, hemoptysis.  The patient also denies abdominal pain, nausea, vomiting, dysphagia, diarrhea, constipation, polyuria, polydipsia, dysuria, hematuria, frequency, urgency, abnormal bleeding or bruising, fever, chills, unexpected weight changes, mood swings, change in skin or hair texture, change in voice quality, auditory or visual problems, allergic reactions or rashes, new musculoskeletal complaints other than usual "aches and pains".   PHYSICAL EXAM BP 98/64  Pulse 73  Resp 24  Ht 5\' 8"  (1.727 m)  Wt 126.644 kg (279 lb 3.2 oz)  BMI 42.46 kg/m2  SpO2 94% She is morbidly obese which makes physical exam difficult  General: Alert, oriented x3, mild distress; she is able to speak in full sentences but is unable to lie flat Head: no evidence of trauma, PERRL, EOMI, no exophtalmos or lid lag, no myxedema, no xanthelasma; normal ears, nose and oropharynx Neck: Unable to see the jugular venous pulsations or evaluate hepatojugular reflux; brisk carotid pulses without delay and no carotid bruits Chest: clear to auscultation, no signs of consolidation by percussion or palpation, normal fremitus, symmetrical and full respiratory excursions Cardiovascular: Unable to define the position and quality of the apical impulse, regular rhythm, normal  first and paradoxically split second heart sounds, no murmurs, rubs, there is a distinct third heart sound gallop Abdomen: no tenderness or distention, no masses by palpation, no abnormal pulsatility or arterial bruits, normal bowel sounds, no hepatosplenomegaly Extremities: no clubbing, cyanosis; 2+ pitting edema pretibially halfway to the knees in a symmetrical fashion; 2+ radial, ulnar and brachial pulses bilaterally; 2+ right femoral, posterior tibial and dorsalis pedis pulses; 2+ left femoral, posterior tibial and dorsalis pedis pulses; no subclavian or femoral bruits Neurological: grossly nonfocal   EKG: Atrial sensed biventricular paced  Lipid Panel  No results found for this basename: chol, trig, hdl, cholhdl, vldl, ldlcalc    BMET    Component Value Date/Time   NA 135 12/12/2011 0631   K 4.1 12/12/2011 0631   CL 98 12/12/2011 0631   CO2 23 12/12/2011 0631   GLUCOSE 160* 12/12/2011 0631   BUN 31* 12/12/2011 0631   CREATININE 0.85 12/12/2011 0631   CALCIUM 9.6 12/12/2011 0631   GFRNONAA 73* 12/12/2011 0631   GFRAA 85* 12/12/2011 0631     ASSESSMENT AND PLAN Acute on chronic combined systolic and diastolic congestive heart failure, NYHA class 4 I would estimate that she is carrying at least 20 pounds of extra fluid. I must admit that at this point I am completely uncertain as to what her dry weight is. Morbid obesity is clearly a complicating factor. Will add metolazone to her current loop diuretic. We'll check laboratory tests and a chest x-ray today. We'll have to bring her back fairly quickly for repeat evaluation. Hopefully we can avoid hospitalization.  Biventricular ICD (implantable cardioverter-defibrillator) in place Comprehensive check in the office shows normal device function. No permanent reprogramming changes were made. No episodes of atrial or ventricular tachyarrhythmia recorded. Excellent biventricular pacing efficiency. LV lead is currently pacing in the M3 to RV coil  configuration   Orders Placed This Encounter  Procedures  . DG Chest 2 View  . B Nat Peptide  . Basic Metabolic Panel (BMET)  . EKG 12-Lead   Meds ordered this encounter  Medications  . allopurinol (ZYLOPRIM) 300 MG tablet    Sig: Take 450 mg once a day  . metolazone (ZAROXOLYN) 5 MG tablet    Sig: Take 1 tablet (5 mg total) by mouth daily.    Dispense:  10 tablet    Refill:  0  . metolazone (ZAROXOLYN) 5 MG tablet    Sig: Take 1 tablet (5 mg total) by mouth daily. Until your weight is 20 lbs less than today's weight on your home scale.    Dispense:  30 tablet    Refill:  [address] MD, [facility] [phone] office [phone] pager

## 2013-08-20 NOTE — Assessment & Plan Note (Signed)
Comprehensive check in the office shows normal device function. No permanent reprogramming changes were made. No episodes of atrial or ventricular tachyarrhythmia recorded. Excellent biventricular pacing efficiency. LV lead is currently pacing in the M3 to RV coil configuration

## 2013-08-20 NOTE — Patient Instructions (Addendum)
Your physician has recommended you make the following change in your medication: TAKE METOLAZONE 5 MG 30 MINS PRIOR TO TORSEMIDE EVERYDAY UNTIL YOU LOSE 20 LBS PER YOUR HOME   A chest x-ray takes a picture of the organs and structures inside the chest, including the heart, lungs, and blood vessels. This test can show several things, including, whether the heart is enlarges; whether fluid is building up in the lungs; and whether pacemaker / defibrillator leads are still in place.  Your physician recommends that you return for lab work today.   Your physician recommends that you schedule a follow-up appointment in: 1 week with an Extender

## 2013-08-20 NOTE — Assessment & Plan Note (Addendum)
I would estimate that she is carrying at least 20 pounds of extra fluid. I must admit that at this point I am completely uncertain as to what her dry weight is. Morbid obesity is clearly a complicating factor. Will add metolazone to her current loop diuretic. We'll check laboratory tests and a chest x-ray today. We'll have to bring her back fairly quickly for repeat evaluation. Hopefully we can avoid hospitalization.

## 2013-08-21 LAB — BRAIN NATRIURETIC PEPTIDE: BRAIN NATRIURETIC PEPTIDE: 309.3 pg/mL — AB (ref 0.0–100.0)

## 2013-08-23 ENCOUNTER — Telehealth: Payer: Self-pay | Admitting: *Deleted

## 2013-08-23 ENCOUNTER — Other Ambulatory Visit: Payer: Self-pay | Admitting: Internal Medicine

## 2013-08-23 DIAGNOSIS — Z79899 Other long term (current) drug therapy: Secondary | ICD-10-CM | POA: Diagnosis not present

## 2013-08-23 DIAGNOSIS — R0602 Shortness of breath: Secondary | ICD-10-CM

## 2013-08-23 LAB — BASIC METABOLIC PANEL
BUN / CREAT RATIO: 25 (ref 11–26)
BUN: 31 mg/dL — AB (ref 8–27)
CALCIUM: 10.3 mg/dL (ref 8.7–10.3)
CHLORIDE: 80 mmol/L — AB (ref 97–108)
CO2: 29 mmol/L (ref 18–29)
Creatinine, Ser: 1.22 mg/dL — ABNORMAL HIGH (ref 0.57–1.00)
GFR calc Af Amer: 55 mL/min/{1.73_m2} — ABNORMAL LOW (ref >59)
GFR calc non Af Amer: 48 mL/min/{1.73_m2} — ABNORMAL LOW (ref >59)
Glucose: 125 mg/dL — ABNORMAL HIGH (ref 65–99)
Potassium: 3.8 mmol/L (ref 3.5–5.2)
SODIUM: 131 mmol/L — AB (ref 134–144)

## 2013-08-23 LAB — BRAIN NATRIURETIC PEPTIDE: BNP: 412.1 pg/mL — ABNORMAL HIGH (ref 0.0–100.0)

## 2013-08-23 NOTE — Telephone Encounter (Signed)
Returned call and informed pt per instructions by MD.  Pt also reminded to continue w/ Gatorade as her sodium and chloride are both low.  Pt advised to dilute Gatorade tomorrow and d/c the day afterwards.  Pt asked how to take the metolazone and informed she can take 1/2 tab before AM dose of torsemide and regular dose of torsemide in evening.  Pt informed she is still to continue this until her weight is down 20 lbs.  Pt verbalized understanding and agreed w/ plan.  Pt understands she can call the office after hours if needed for advice.

## 2013-08-23 NOTE — Telephone Encounter (Signed)
Lab results received and placed on Dr. Lysbeth Penner cart for review and advice.  Message forwarded to Dr. Debara Pickett.

## 2013-08-23 NOTE — Telephone Encounter (Signed)
Call to pt to check on her.  Pt stated she feels much better after eating some food and drinking Gatorade.  Stated she doesn't feel like doing either and had to make herself eat/drink, but feels better.  Pt informed lab results are not back yet and if it is after hours and they are abnormal, the lab will call on-call provider to notify.  Pt advised to continue Gatorade today, as long as BG is okay and starting tomorrow, dilute it some w/ water.  Pt verbalized understanding and agreed w/ plan.

## 2013-08-23 NOTE — Telephone Encounter (Signed)
Pt was calling in regards to her new medication Metolazone 5 mg. She is experiencing weakness, soreness, no appetite, bloated in her stomach it makes her sick. She wanted to know if these were side effects.  [place]

## 2013-08-23 NOTE — Telephone Encounter (Signed)
I reviewed labs .Marland Kitchen She appears somewhat dehydrated.  Please have her decrease her metolazone to 2.5 mg daily.  Her diuresis appears to rapid.  Dr. Debara Pickett

## 2013-08-23 NOTE — Telephone Encounter (Signed)
Call to [facility] and informed labs were picked up about 11:30am.

## 2013-08-23 NOTE — Telephone Encounter (Signed)
Returned call and pt verified x 2.  Pt stated the new medicine has worked, but her joints and muscles are sore.  Pt c/o cramps in her legs, decreased appetite.  Stated her breathing is better, but otherwise she can hardly function.  Pt also c/o photosensitivity and dizziness.  Stated she feels bloated and her stomach hurts in a way she can't explain.    Dr. Debara Pickett notified and advised BMP and BNP STAT.  Pt informed and verbalized understanding.  Pt will go to nearest lab: [facility] in [place] on [address].  Lab orders faxed STAT.  Fax results to [employer].

## 2013-08-28 ENCOUNTER — Emergency Department (HOSPITAL_COMMUNITY): Payer: Medicare Other

## 2013-08-28 ENCOUNTER — Telehealth: Payer: Self-pay | Admitting: Nurse Practitioner

## 2013-08-28 ENCOUNTER — Inpatient Hospital Stay (HOSPITAL_COMMUNITY)
Admission: EM | Admit: 2013-08-28 | Discharge: 2013-08-30 | DRG: 392 | Disposition: A | Discharge status: Home Health | Payer: Medicare Other | Attending: Internal Medicine | Admitting: Internal Medicine

## 2013-08-28 ENCOUNTER — Encounter (HOSPITAL_COMMUNITY): Payer: Self-pay | Admitting: Emergency Medicine

## 2013-08-28 ENCOUNTER — Ambulatory Visit: Payer: Medicare Other | Admitting: Cardiology

## 2013-08-28 DIAGNOSIS — K2970 Gastritis, unspecified, without bleeding: Secondary | ICD-10-CM | POA: Diagnosis present

## 2013-08-28 DIAGNOSIS — Z9221 Personal history of antineoplastic chemotherapy: Secondary | ICD-10-CM | POA: Diagnosis not present

## 2013-08-28 DIAGNOSIS — E119 Type 2 diabetes mellitus without complications: Secondary | ICD-10-CM

## 2013-08-28 DIAGNOSIS — Z794 Long term (current) use of insulin: Secondary | ICD-10-CM

## 2013-08-28 DIAGNOSIS — E7800 Pure hypercholesterolemia, unspecified: Secondary | ICD-10-CM | POA: Diagnosis present

## 2013-08-28 DIAGNOSIS — I2510 Atherosclerotic heart disease of native coronary artery without angina pectoris: Secondary | ICD-10-CM | POA: Diagnosis present

## 2013-08-28 DIAGNOSIS — I5022 Chronic systolic (congestive) heart failure: Secondary | ICD-10-CM | POA: Diagnosis present

## 2013-08-28 DIAGNOSIS — Z853 Personal history of malignant neoplasm of breast: Secondary | ICD-10-CM

## 2013-08-28 DIAGNOSIS — K2990 Gastroduodenitis, unspecified, without bleeding: Secondary | ICD-10-CM | POA: Diagnosis present

## 2013-08-28 DIAGNOSIS — R0989 Other specified symptoms and signs involving the circulatory and respiratory systems: Secondary | ICD-10-CM | POA: Diagnosis not present

## 2013-08-28 DIAGNOSIS — I10 Essential (primary) hypertension: Secondary | ICD-10-CM

## 2013-08-28 DIAGNOSIS — I429 Cardiomyopathy, unspecified: Secondary | ICD-10-CM | POA: Diagnosis not present

## 2013-08-28 DIAGNOSIS — E860 Dehydration: Secondary | ICD-10-CM | POA: Diagnosis present

## 2013-08-28 DIAGNOSIS — E785 Hyperlipidemia, unspecified: Secondary | ICD-10-CM | POA: Diagnosis present

## 2013-08-28 DIAGNOSIS — E049 Nontoxic goiter, unspecified: Secondary | ICD-10-CM

## 2013-08-28 DIAGNOSIS — R079 Chest pain, unspecified: Secondary | ICD-10-CM | POA: Diagnosis not present

## 2013-08-28 DIAGNOSIS — R1110 Vomiting, unspecified: Secondary | ICD-10-CM | POA: Diagnosis not present

## 2013-08-28 DIAGNOSIS — R112 Nausea with vomiting, unspecified: Secondary | ICD-10-CM | POA: Diagnosis not present

## 2013-08-28 DIAGNOSIS — N179 Acute kidney failure, unspecified: Secondary | ICD-10-CM

## 2013-08-28 DIAGNOSIS — M109 Gout, unspecified: Secondary | ICD-10-CM | POA: Diagnosis present

## 2013-08-28 DIAGNOSIS — J40 Bronchitis, not specified as acute or chronic: Secondary | ICD-10-CM

## 2013-08-28 DIAGNOSIS — K2900 Acute gastritis without bleeding: Secondary | ICD-10-CM | POA: Diagnosis present

## 2013-08-28 DIAGNOSIS — E871 Hypo-osmolality and hyponatremia: Secondary | ICD-10-CM | POA: Diagnosis present

## 2013-08-28 DIAGNOSIS — E1169 Type 2 diabetes mellitus with other specified complication: Secondary | ICD-10-CM | POA: Diagnosis present

## 2013-08-28 DIAGNOSIS — Z79899 Other long term (current) drug therapy: Secondary | ICD-10-CM | POA: Diagnosis not present

## 2013-08-28 DIAGNOSIS — Z87891 Personal history of nicotine dependence: Secondary | ICD-10-CM

## 2013-08-28 DIAGNOSIS — Z95810 Presence of automatic (implantable) cardiac defibrillator: Secondary | ICD-10-CM | POA: Diagnosis not present

## 2013-08-28 DIAGNOSIS — D72829 Elevated white blood cell count, unspecified: Secondary | ICD-10-CM | POA: Diagnosis present

## 2013-08-28 DIAGNOSIS — T451X5A Adverse effect of antineoplastic and immunosuppressive drugs, initial encounter: Secondary | ICD-10-CM

## 2013-08-28 DIAGNOSIS — E876 Hypokalemia: Secondary | ICD-10-CM | POA: Diagnosis not present

## 2013-08-28 DIAGNOSIS — I5043 Acute on chronic combined systolic (congestive) and diastolic (congestive) heart failure: Secondary | ICD-10-CM | POA: Diagnosis present

## 2013-08-28 DIAGNOSIS — C50919 Malignant neoplasm of unspecified site of unspecified female breast: Secondary | ICD-10-CM

## 2013-08-28 DIAGNOSIS — M6289 Other specified disorders of muscle: Secondary | ICD-10-CM

## 2013-08-28 DIAGNOSIS — I5042 Chronic combined systolic (congestive) and diastolic (congestive) heart failure: Secondary | ICD-10-CM | POA: Diagnosis present

## 2013-08-28 DIAGNOSIS — I509 Heart failure, unspecified: Secondary | ICD-10-CM | POA: Diagnosis present

## 2013-08-28 DIAGNOSIS — I447 Left bundle-branch block, unspecified: Secondary | ICD-10-CM | POA: Diagnosis present

## 2013-08-28 DIAGNOSIS — E669 Obesity, unspecified: Secondary | ICD-10-CM

## 2013-08-28 DIAGNOSIS — R55 Syncope and collapse: Secondary | ICD-10-CM

## 2013-08-28 DIAGNOSIS — I427 Cardiomyopathy due to drug and external agent: Secondary | ICD-10-CM

## 2013-08-28 HISTORY — DX: Presence of cardiac pacemaker: Z95.0

## 2013-08-28 HISTORY — DX: Presence of automatic (implantable) cardiac defibrillator: Z95.810

## 2013-08-28 LAB — PRO B NATRIURETIC PEPTIDE: Pro B Natriuretic peptide (BNP): 4511 pg/mL — ABNORMAL HIGH (ref 0–125)

## 2013-08-28 LAB — COMPREHENSIVE METABOLIC PANEL
ALT: 15 U/L (ref 0–35)
AST: 21 U/L (ref 0–37)
Albumin: 3.9 g/dL (ref 3.5–5.2)
Alkaline Phosphatase: 111 U/L (ref 39–117)
BILIRUBIN TOTAL: 1.6 mg/dL — AB (ref 0.3–1.2)
BUN: 76 mg/dL — AB (ref 6–23)
CHLORIDE: 75 meq/L — AB (ref 96–112)
CO2: 32 meq/L (ref 19–32)
CREATININE: 1.53 mg/dL — AB (ref 0.50–1.10)
Calcium: 10.3 mg/dL (ref 8.4–10.5)
GFR calc Af Amer: 41 mL/min — ABNORMAL LOW (ref >90)
GFR, EST NON AFRICAN AMERICAN: 35 mL/min — AB (ref >90)
GLUCOSE: 202 mg/dL — AB (ref 70–99)
Potassium: 3 mEq/L — ABNORMAL LOW (ref 3.7–5.3)
Sodium: 126 mEq/L — ABNORMAL LOW (ref 137–147)
Total Protein: 8 g/dL (ref 6.0–8.3)

## 2013-08-28 LAB — URINALYSIS, ROUTINE W REFLEX MICROSCOPIC
Bilirubin Urine: NEGATIVE
Glucose, UA: NEGATIVE mg/dL
Hgb urine dipstick: NEGATIVE
KETONES UR: NEGATIVE mg/dL
LEUKOCYTES UA: NEGATIVE
NITRITE: NEGATIVE
PH: 5.5 (ref 5.0–8.0)
Protein, ur: NEGATIVE mg/dL
Specific Gravity, Urine: 1.014 (ref 1.005–1.030)
Urobilinogen, UA: 0.2 mg/dL (ref 0.0–1.0)

## 2013-08-28 LAB — CBC WITH DIFFERENTIAL/PLATELET
Basophils Absolute: 0 10*3/uL (ref 0.0–0.1)
Basophils Relative: 0 % (ref 0–1)
EOS ABS: 0 10*3/uL (ref 0.0–0.7)
Eosinophils Relative: 0 % (ref 0–5)
HCT: 43.4 % (ref 36.0–46.0)
Hemoglobin: 15.4 g/dL — ABNORMAL HIGH (ref 12.0–15.0)
LYMPHS PCT: 7 % — AB (ref 12–46)
Lymphs Abs: 1.1 10*3/uL (ref 0.7–4.0)
MCH: 31.5 pg (ref 26.0–34.0)
MCHC: 35.5 g/dL (ref 30.0–36.0)
MCV: 88.8 fL (ref 78.0–100.0)
Monocytes Absolute: 1.4 10*3/uL — ABNORMAL HIGH (ref 0.1–1.0)
Monocytes Relative: 9 % (ref 3–12)
NEUTROS PCT: 84 % — AB (ref 43–77)
Neutro Abs: 12.6 10*3/uL — ABNORMAL HIGH (ref 1.7–7.7)
PLATELETS: 188 10*3/uL (ref 150–400)
RBC: 4.89 MIL/uL (ref 3.87–5.11)
RDW: 13.5 % (ref 11.5–15.5)
WBC: 15.1 10*3/uL — AB (ref 4.0–10.5)

## 2013-08-28 LAB — GLUCOSE, CAPILLARY: GLUCOSE-CAPILLARY: 250 mg/dL — AB (ref 70–99)

## 2013-08-28 LAB — TROPONIN I: Troponin I: <0.3 ng/mL (ref <0.30)

## 2013-08-28 LAB — MAGNESIUM: Magnesium: 2.7 mg/dL — ABNORMAL HIGH (ref 1.5–2.5)

## 2013-08-28 LAB — LIPASE, BLOOD: LIPASE: 40 U/L (ref 11–59)

## 2013-08-28 MED ORDER — SODIUM CHLORIDE 0.9 % IJ SOLN
3.0000 mL | Freq: Two times a day (BID) | INTRAMUSCULAR | Status: DC
  Administered 2013-08-29 – 2013-08-30 (×3): 3 mL via INTRAVENOUS

## 2013-08-28 MED ORDER — SODIUM CHLORIDE 0.9 % IV SOLN: INTRAVENOUS | Status: AC

## 2013-08-28 MED ORDER — POTASSIUM CHLORIDE CRYS ER 20 MEQ PO TBCR
40.0000 meq | EXTENDED_RELEASE_TABLET | Freq: Once | ORAL | Status: AC
  Administered 2013-08-28: 40 meq via ORAL
  Filled 2013-08-28: qty 2

## 2013-08-28 MED ORDER — CARVEDILOL PHOSPHATE ER 40 MG PO CP24
40.0000 mg | ORAL_CAPSULE | Freq: Every day | ORAL | Status: DC
  Administered 2013-08-29 – 2013-08-30 (×2): 40 mg via ORAL
  Filled 2013-08-28 (×2): qty 1

## 2013-08-28 MED ORDER — SODIUM CHLORIDE 0.9 % IV BOLUS (SEPSIS)
250.0000 mL | Freq: Once | INTRAVENOUS | Status: AC
  Administered 2013-08-28: 250 mL via INTRAVENOUS

## 2013-08-28 MED ORDER — ONDANSETRON HCL 4 MG/2ML IJ SOLN: 4.0000 mg | Freq: Four times a day (QID) | INTRAMUSCULAR | Status: DC | PRN

## 2013-08-28 MED ORDER — SODIUM CHLORIDE 0.9 % IV SOLN
INTRAVENOUS | Status: DC
  Administered 2013-08-28: 13:00:00 via INTRAVENOUS

## 2013-08-28 MED ORDER — ONDANSETRON HCL 4 MG/2ML IJ SOLN: 4.0000 mg | Freq: Three times a day (TID) | INTRAMUSCULAR | Status: AC | PRN

## 2013-08-28 MED ORDER — POTASSIUM CHLORIDE CRYS ER 20 MEQ PO TBCR
40.0000 meq | EXTENDED_RELEASE_TABLET | ORAL | Status: AC
  Administered 2013-08-28 – 2013-08-29 (×3): 40 meq via ORAL
  Filled 2013-08-28 (×3): qty 2

## 2013-08-28 MED ORDER — INSULIN GLARGINE 100 UNIT/ML ~~LOC~~ SOLN
30.0000 [IU] | Freq: Every day | SUBCUTANEOUS | Status: DC
  Filled 2013-08-28: qty 0.3

## 2013-08-28 MED ORDER — AMOXICILLIN-POT CLAVULANATE 875-125 MG PO TABS
1.0000 | ORAL_TABLET | Freq: Two times a day (BID) | ORAL | Status: DC
  Administered 2013-08-28 – 2013-08-29 (×2): 1 via ORAL
  Filled 2013-08-28 (×3): qty 1

## 2013-08-28 MED ORDER — SODIUM CHLORIDE 0.9 % IV BOLUS (SEPSIS)
500.0000 mL | Freq: Once | INTRAVENOUS | Status: AC
  Administered 2013-08-28: 500 mL via INTRAVENOUS

## 2013-08-28 MED ORDER — ONDANSETRON HCL 4 MG PO TABS: 4.0000 mg | ORAL_TABLET | Freq: Four times a day (QID) | ORAL | Status: DC | PRN

## 2013-08-28 MED ORDER — TEMAZEPAM 15 MG PO CAPS
15.0000 mg | ORAL_CAPSULE | Freq: Every evening | ORAL | Status: DC | PRN
  Administered 2013-08-28: 15 mg via ORAL
  Filled 2013-08-28: qty 1

## 2013-08-28 MED ORDER — INSULIN GLARGINE 100 UNIT/ML ~~LOC~~ SOLN
30.0000 [IU] | Freq: Every day | SUBCUTANEOUS | Status: DC
  Administered 2013-08-29 – 2013-08-30 (×2): 30 [IU] via SUBCUTANEOUS
  Filled 2013-08-28 (×3): qty 0.3

## 2013-08-28 MED ORDER — INSULIN ASPART 100 UNIT/ML ~~LOC~~ SOLN
0.0000 [IU] | Freq: Every day | SUBCUTANEOUS | Status: DC
  Administered 2013-08-29: 2 [IU] via SUBCUTANEOUS

## 2013-08-28 MED ORDER — ENOXAPARIN SODIUM 30 MG/0.3ML ~~LOC~~ SOLN
30.0000 mg | SUBCUTANEOUS | Status: DC
  Filled 2013-08-28 (×3): qty 0.3

## 2013-08-28 MED ORDER — INSULIN ASPART 100 UNIT/ML ~~LOC~~ SOLN
0.0000 [IU] | Freq: Three times a day (TID) | SUBCUTANEOUS | Status: DC
  Administered 2013-08-29 (×2): 2 [IU] via SUBCUTANEOUS
  Administered 2013-08-29: 3 [IU] via SUBCUTANEOUS
  Administered 2013-08-30: 2 [IU] via SUBCUTANEOUS

## 2013-08-28 MED ORDER — POTASSIUM CHLORIDE IN NACL 20-0.9 MEQ/L-% IV SOLN
INTRAVENOUS | Status: DC
  Administered 2013-08-28: 21:00:00 via INTRAVENOUS
  Filled 2013-08-28 (×5): qty 1000

## 2013-08-28 NOTE — Progress Notes (Signed)
Unit CM UR Completed by [facility] ED CM  W. Hailly Fess RN  

## 2013-08-28 NOTE — ED Provider Notes (Signed)
CSN: [id number]     Arrival date & time 08/28/13  1100 History   First MD Initiated Contact with Patient 08/28/13 1119     Chief Complaint  Patient presents with  . AICD Problem  . Dehydration     (Consider location/radiation/quality/duration/timing/severity/associated sxs/prior Treatment) The history is provided by the patient.   63 year old female primary care Dr. is Dr. Concha Pyo from [facility]. Patient also followed by cardiology. Patient with one-week history of nausea vomiting. Not been well not drinking well. Patient states that her automatic defibrillator but is to 3 times in the last few days. Patient's had cough chest pain with the cough and will but a productive cough. The pacemaker defibrillator was twice today. Did not fire. Patient denies any vomiting of blood. Denies any diarrhea. Patient talked to her cardiologist today and they recommended she come in for evaluation.  Past Medical History  Diagnosis Date  . Goiter   . DM2 (diabetes mellitus, type 2)   . Chronic systolic heart failure   . Invasive ductal carcinoma of breast   . Gout   . Hyperlipidemia   . Hypertension   . Breast cancer   . LBBB (left bundle branch block)     evidnece of RBBB fall 2012  . Cardiomyopathy secondary to chemotherapy     a.  doxurubicin;  b. s/p SJM Quadra Assura CRT-D, model [id number] Q, Ser# [id number]  . Syncope   . PONV (postoperative nausea and vomiting)     nausea post ICD placement relieved with Zofran  . Coronary artery disease   . Angina   . CHF (congestive heart failure)   . Shortness of breath   . Nonischemic cardiomyopathy     anthracycline-related   Past Surgical History  Procedure Laterality Date  . Mastectomy      right  . Tunneled venous catheter placement      removed  . Breast surgery      Left mastectomy  . Cardiac defibrillator placement      St.Jude Quadra  . Cesarean section  [place]  . Dilation and curettage of uterus  1975  . Portacath  placement  1991  . Foot surgery  1994  . Nm myocar perf wall motion  12/01/2011    no ischemia; EF 22%   Family History  Problem Relation Age of Onset  . Heart attack Father   . Hypertension Brother   . Diabetes Brother    History  Substance Use Topics  . Smoking status: Former Research scientist (life sciences)  . Smokeless tobacco: Never Used  . Alcohol Use: No   OB History   Grav Para Term Preterm Abortions TAB SAB Ect Mult Living                 Review of Systems  Constitutional: Positive for fatigue. Negative for fever.  HENT: Positive for congestion.   Eyes: Negative for redness.  Respiratory: Positive for cough.   Cardiovascular: Positive for chest pain.       With cough.  Gastrointestinal: Positive for nausea and vomiting. Negative for abdominal pain.  Genitourinary: Negative for dysuria.  Musculoskeletal: Positive for myalgias.  Skin: Negative for rash.  Neurological: Negative for syncope and headaches.  Hematological: Does not bruise/bleed easily.  Psychiatric/Behavioral: Negative for confusion.      Allergies  Lasix; Ace inhibitors; Codeine; Digitalis; Levaquin; Other; and Statins  Home Medications   Current Outpatient Rx  Name  Route  Sig  Dispense  Refill  . allopurinol (ZYLOPRIM)  300 MG tablet      Take 450 mg once a day         . carvedilol (COREG CR) 40 MG 24 hr capsule   Oral   Take 40 mg by mouth daily.         . colchicine 0.6 MG tablet   Oral   Take 0.6 mg by mouth daily as needed (for pain).          Marland Kitchen LANTUS SOLOSTAR 100 UNIT/ML SOPN   Subcutaneous   Inject 63 Units into the skin daily.          Marland Kitchen spironolactone (ALDACTONE) 50 MG tablet   Oral   Take 1 tablet (50 mg total) by mouth daily.   90 tablet   3   . torsemide (DEMADEX) 100 MG tablet   Oral   Take 50 mg by mouth 2 (two) times daily.           BP 111/54  Pulse 78  Temp(Src) 98.1 F (36.7 C) (Oral)  Resp 22  SpO2 91% Physical Exam  Nursing note and vitals  reviewed. Constitutional: She is oriented to person, place, and time. She appears well-developed and well-nourished. No distress.  HENT:  Head: Normocephalic and atraumatic.  Mouth/Throat: No oropharyngeal exudate.  Mucous membranes dry.  Eyes: Conjunctivae and EOM are normal. Pupils are equal, round, and reactive to light.  Neck: Normal range of motion.  Cardiovascular: Normal rate, regular rhythm and normal heart sounds.   No murmur heard. Pulmonary/Chest: Effort normal and breath sounds normal. She has no wheezes. She has no rales.  Abdominal: Soft. Bowel sounds are normal. There is no tenderness.  Musculoskeletal: Normal range of motion.  Neurological: She is alert and oriented to person, place, and time. No cranial nerve deficit. She exhibits normal muscle tone. Coordination normal.  Skin: Skin is warm. No rash noted.    ED Course  Procedures (including critical care time) Labs Review Labs Reviewed  PRO B NATRIURETIC PEPTIDE - Abnormal; Notable for the following:    Pro B Natriuretic peptide (BNP) 4511.0 (*)    All other components within normal limits  COMPREHENSIVE METABOLIC PANEL - Abnormal; Notable for the following:    Sodium 126 (*)    Potassium 3.0 (*)    Chloride 75 (*)    Glucose, Bld 202 (*)    BUN 76 (*)    Creatinine, Ser 1.53 (*)    Total Bilirubin 1.6 (*)    GFR calc non Af Amer 35 (*)    GFR calc Af Amer 41 (*)    All other components within normal limits  CBC WITH DIFFERENTIAL - Abnormal; Notable for the following:    WBC 15.1 (*)    Hemoglobin 15.4 (*)    Neutrophils Relative % 84 (*)    Neutro Abs 12.6 (*)    Lymphocytes Relative 7 (*)    Monocytes Absolute 1.4 (*)    All other components within normal limits  URINALYSIS, ROUTINE W REFLEX MICROSCOPIC - Abnormal; Notable for the following:    APPearance HAZY (*)    All other components within normal limits  TROPONIN I  LIPASE, BLOOD   Results for orders placed during the hospital encounter of  08/28/13  TROPONIN I      Result Value Ref Range   Troponin I <0.30  <0.30 ng/mL  PRO B NATRIURETIC PEPTIDE      Result Value Ref Range   Pro B Natriuretic peptide (BNP) 4511.0 (*)  0 - 125 pg/mL  COMPREHENSIVE METABOLIC PANEL      Result Value Ref Range   Sodium 126 (*) 137 - 147 mEq/L   Potassium 3.0 (*) 3.7 - 5.3 mEq/L   Chloride 75 (*) 96 - 112 mEq/L   CO2 32  19 - 32 mEq/L   Glucose, Bld 202 (*) 70 - 99 mg/dL   BUN 76 (*) 6 - 23 mg/dL   Creatinine, Ser 1.53 (*) 0.50 - 1.10 mg/dL   Calcium 10.3  8.4 - 10.5 mg/dL   Total Protein 8.0  6.0 - 8.3 g/dL   Albumin 3.9  3.5 - 5.2 g/dL   AST 21  0 - 37 U/L   ALT 15  0 - 35 U/L   Alkaline Phosphatase 111  39 - 117 U/L   Total Bilirubin 1.6 (*) 0.3 - 1.2 mg/dL   GFR calc non Af Amer 35 (*) >90 mL/min   GFR calc Af Amer 41 (*) >90 mL/min  CBC WITH DIFFERENTIAL      Result Value Ref Range   WBC 15.1 (*) 4.0 - 10.5 K/uL   RBC 4.89  3.87 - 5.11 MIL/uL   Hemoglobin 15.4 (*) 12.0 - 15.0 g/dL   HCT 43.4  36.0 - 46.0 %   MCV 88.8  78.0 - 100.0 fL   MCH 31.5  26.0 - 34.0 pg   MCHC 35.5  30.0 - 36.0 g/dL   RDW 13.5  11.5 - 15.5 %   Platelets 188  150 - 400 K/uL   Neutrophils Relative % 84 (*) 43 - 77 %   Neutro Abs 12.6 (*) 1.7 - 7.7 K/uL   Lymphocytes Relative 7 (*) 12 - 46 %   Lymphs Abs 1.1  0.7 - 4.0 K/uL   Monocytes Relative 9  3 - 12 %   Monocytes Absolute 1.4 (*) 0.1 - 1.0 K/uL   Eosinophils Relative 0  0 - 5 %   Eosinophils Absolute 0.0  0.0 - 0.7 K/uL   Basophils Relative 0  0 - 1 %   Basophils Absolute 0.0  0.0 - 0.1 K/uL  LIPASE, BLOOD      Result Value Ref Range   Lipase 40  11 - 59 U/L  URINALYSIS, ROUTINE W REFLEX MICROSCOPIC      Result Value Ref Range   Color, Urine YELLOW  YELLOW   APPearance HAZY (*) CLEAR   Specific Gravity, Urine 1.014  1.005 - 1.030   pH 5.5  5.0 - 8.0   Glucose, UA NEGATIVE  NEGATIVE mg/dL   Hgb urine dipstick NEGATIVE  NEGATIVE   Bilirubin Urine NEGATIVE  NEGATIVE   Ketones, ur NEGATIVE   NEGATIVE mg/dL   Protein, ur NEGATIVE  NEGATIVE mg/dL   Urobilinogen, UA 0.2  0.0 - 1.0 mg/dL   Nitrite NEGATIVE  NEGATIVE   Leukocytes, UA NEGATIVE  NEGATIVE    Imaging Review Dg Chest Port 1 View  08/28/2013   CLINICAL DATA:  AICD problem.  EXAM: PORTABLE CHEST - 1 VIEW  COMPARISON:  08/20/2013  FINDINGS: Underpenetration somewhat limits evaluation of the a left approach biventricular AICD/ pacer. The lead orientation is stable from prior. No evidence of lead discontinuity. Battery pack has a similar orientation.  Chronic cardiopericardial enlargement. Interstitial prominence at the bases likely from hypoaeration and portable technique. There is mild venous congestion. No consolidation, effusion, or pneumothorax. Large mass at the thoracic inlet, displacing the trachea to the left. The finding is present since at least  2011, and unchanged from 2013. There is history goiter in the electronic medical record.  IMPRESSION: 1. Unchanged appearance of left approach biventricular ICD/ pacer. 2. Venous congestion without pulmonary edema. 3. Chronic mass at the right thoracic inlet, likely thyroidal in this patient with history of goiter.   Electronically Signed   By: Jorje Guild M.D.   On: 08/28/2013 12:26    EKG Interpretation    Date/Time:  Wednesday August 28 2013 11:12:59 EST Ventricular Rate:  82 PR Interval:  172 QRS Duration: 186 QT Interval:  488 QTC Calculation: 570 R Axis:   -58 Text Interpretation:  Atrial-sensed ventricular-paced rhythm Abnormal ECG Confirmed by Carolyne Whitsel  MD, Sana Tessmer ([id number]) on 08/28/2013 11:29:43 AM            MDM   Final diagnoses:  Nausea & vomiting  Dehydration  Hyponatremia  Hypokalemia    The patient cleared by cardiology. The patient and interrogation of her St. Jude medical device showed no firing of the defibrillator. However it was charging up. They feel this is do to being confused from coughing spells. From the cardiology standpoint she be  discharged home. However patient has had nausea and vomiting for a week. 19 well not drinking well. Patient feeling very fatigued. Patient also with a cough productive cough. Chest x-rays negative for pneumonia or CHF. Troponins negative EKG without acute findings. Does show a paced rhythm. However patient's electrolytes do show of hyponatremia hypokalemia elevation in her BUN/creatinine from baseline. Discussed with hospitalist. Patient would benefit from the overnight hydration cardiac monitoring. Patient given 40 no close potassium here patient's been getting normal saline here. Temporary middle orders completed.    Mervin Kung, MD 08/28/13 [phone]

## 2013-08-28 NOTE — Telephone Encounter (Signed)
Pt called this morning to report progressive orthopnea, early satiety, wkns, and malaise despite significant weight loss following diuretic changes last week.  Her ICD also went off twice yesterday and twice more since going to bed last night.  I rec that she come into the ED now for evaluation.  She is not willing to call

## 2013-08-28 NOTE — Telephone Encounter (Signed)
Pt called this morning to report progressive orthopnea, early satiety, wkns, and malaise despite significant weight loss following diuretic changes last week. Her ICD also went off twice yesterday and twice more since going to bed last night. I rec that she come into the ED now for evaluation. She is not willing to call EMS because she [place] two counties away and does not want to be taken to her local hospital in [place].  She will have her son drive her to [facility].

## 2013-08-28 NOTE — ED Notes (Signed)
Pt reports nausea/vomiting x 1 week, states lost 25 pounds this week. Has not eaten in a week. Also reports AICD has fired twice yesterday and twice this morning. Chest pain from coughing.

## 2013-08-28 NOTE — H&P (Signed)
[facility] History and Physical  Sara Walker [id number] DOB: 1951-04-15 DOA: 08/28/2013  Referring physician: er PCP: Horatio Pel, MD   Chief Complaint: N/V  HPI: Sara Walker is a 63 y.o. female with an extensive PMHX.  She comes in with a week history of N/V and cough.  Nausea and vomiting has mainly been in the AM.  She has poor oral intake.  No sick contacts.  She does not think it is related to her coughing.  She has been coughing up greenish sputum.   Patient also thought her defibrillator went off x2- this was interrogated and did not show firing.  Denies fevers , chills.  She has been fatigued, +cough and SOB, sinus pressure and dryness  In the Er, her Na was low and K was high.  X ray did not show PNA.  ER spoke with cards regarding her AICD- no further intervention.  Hospitalist were called for admission   Review of Systems:  All systems reviewed, negative unless stated above   Past Medical History  Diagnosis Date  . Goiter   . DM2 (diabetes mellitus, type 2)   . Chronic systolic heart failure   . Invasive ductal carcinoma of breast   . Gout   . Hyperlipidemia   . Hypertension   . Breast cancer   . LBBB (left bundle branch block)     evidnece of RBBB fall 2012  . Cardiomyopathy secondary to chemotherapy     a.  doxurubicin;  b. s/p SJM Quadra Assura CRT-D, model [id number] Q, Ser# [id number]  . Syncope   . PONV (postoperative nausea and vomiting)     nausea post ICD placement relieved with Zofran  . Coronary artery disease   . Angina   . CHF (congestive heart failure)   . Shortness of breath   . Nonischemic cardiomyopathy     anthracycline-related   Past Surgical History  Procedure Laterality Date  . Mastectomy      right  . Tunneled venous catheter placement      removed  . Breast surgery      Left mastectomy  . Cardiac defibrillator placement      St.Jude Quadra  . Cesarean section  [place]  . Dilation and curettage of  uterus  1975  . Portacath placement  1991  . Foot surgery  1994  . Nm myocar perf wall motion  12/01/2011    no ischemia; EF 22%   Social History:  reports that she has quit smoking. She has never used smokeless tobacco. She reports that she does not drink alcohol or use illicit drugs.  Allergies  Allergen Reactions  . Lasix [Furosemide]     Causes gout  . Ace Inhibitors Cough  . Codeine Other (See Comments)    Crazy  . Digitalis Other (See Comments)    "saw yellow circles"  . Levaquin [Levofloxacin In D5w]     Gout   . Other Other (See Comments)    Nitro spray: reaction unknown  . Statins Other (See Comments)    Reaction unknown    Family History  Problem Relation Age of Onset  . Heart attack Father   . Hypertension Brother   . Diabetes Brother      Prior to Admission medications   Medication Sig Start Date End Date Taking? Authorizing Provider  allopurinol (ZYLOPRIM) 300 MG tablet Take 450 mg once a day   Yes Historical Provider, MD  carvedilol (COREG CR) 40 MG 24 hr  capsule Take 40 mg by mouth daily.   Yes Historical Provider, MD  colchicine 0.6 MG tablet Take 0.6 mg by mouth daily as needed (for pain).    Yes Historical Provider, MD  LANTUS SOLOSTAR 100 UNIT/ML SOPN Inject 63 Units into the skin daily.  01/28/13  Yes Historical Provider, MD  spironolactone (ALDACTONE) 50 MG tablet Take 1 tablet (50 mg total) by mouth daily. 06/17/13  Yes Mihai Croitoru, MD  torsemide (DEMADEX) 100 MG tablet Take 50 mg by mouth 2 (two) times daily.  03/20/12  Yes Deboraha Sprang, MD   Physical Exam: Filed Vitals:   08/28/13 1602  BP: 111/54  Pulse: 78  Temp:   Resp: 22    BP 111/54  Pulse 78  Temp(Src) 98.1 F (36.7 C) (Oral)  Resp 22  SpO2 91%  Nursing note and vitals reviewed.  Constitutional: She is oriented to person, place, and time. appropriate HENT:  Head: Normocephalic and atraumatic.  Mouth/Throat: No oropharyngeal exudate.  Mucous membranes dry.  Eyes:  Conjunctivae and EOM are normal. Pupils are equal, round, and reactive to light.  Neck: Normal range of motion.  Cardiovascular: Normal rate, regular rhythm and normal heart sounds.  No murmur heard.  Pulmonary/Chest: Effort normal and breath sounds normal. Decreased due to body habitus  She has no wheezes. She has no rales.  Abdominal: Soft. Bowel sounds are normal. There is no tenderness.  Musculoskeletal: Normal range of motion.  Neurological: She is alert and oriented to person, place, and time. No cranial nerve deficit. She exhibits normal muscle tone. Coordination normal.  Skin: Skin is warm. No rash noted           Labs on Admission:  Basic Metabolic Panel:  Recent Labs Lab 08/23/13 1028 08/28/13 1230  NA 131* 126*  K 3.8 3.0*  CL 80* 75*  CO2 29 32  GLUCOSE 125* 202*  BUN 31* 76*  CREATININE 1.22* 1.53*  CALCIUM 10.3 10.3   Liver Function Tests:  Recent Labs Lab 08/28/13 1230  AST 21  ALT 15  ALKPHOS 111  BILITOT 1.6*  PROT 8.0  ALBUMIN 3.9    Recent Labs Lab 08/28/13 1230  LIPASE 40   No results found for this basename: AMMONIA,  in the last 168 hours CBC:  Recent Labs Lab 08/28/13 1230  WBC 15.1*  NEUTROABS 12.6*  HGB 15.4*  HCT 43.4  MCV 88.8  PLT 188   Cardiac Enzymes:  Recent Labs Lab 08/28/13 1215  TROPONINI <0.30    BNP (last 3 results)  Recent Labs  08/28/13 1215  PROBNP 4511.0*   CBG: No results found for this basename: GLUCAP,  in the last 168 hours  Radiological Exams on Admission: Dg Chest Port 1 View  08/28/2013   CLINICAL DATA:  AICD problem.  EXAM: PORTABLE CHEST - 1 VIEW  COMPARISON:  08/20/2013  FINDINGS: Underpenetration somewhat limits evaluation of the a left approach biventricular AICD/ pacer. The lead orientation is stable from prior. No evidence of lead discontinuity. Battery pack has a similar orientation.  Chronic cardiopericardial enlargement. Interstitial prominence at the bases likely from hypoaeration  and portable technique. There is mild venous congestion. No consolidation, effusion, or pneumothorax. Large mass at the thoracic inlet, displacing the trachea to the left. The finding is present since at least 2011, and unchanged from 2013. There is history goiter in the electronic medical record.  IMPRESSION: 1. Unchanged appearance of left approach biventricular ICD/ pacer. 2. Venous congestion without pulmonary edema.  3. Chronic mass at the right thoracic inlet, likely thyroidal in this patient with history of goiter.   Electronically Signed   By: Jorje Guild M.D.   On: 08/28/2013 12:26      Assessment/Plan Active Problems:   DM2 (diabetes mellitus, type 2)   Hypertension   Chronic systolic heart failure   AKI (acute kidney injury)   Nausea & vomiting   Dehydration   Hypokalemia   Hyponatremia   Leukocytosis   N/V- symptomatic treatment, no abd pain, zofran  Dehydration- IVF, monitor closely as h/o CHF, hold diuretics  AKI- gentle IVF  Hypokalemia- replete, check Mg  Hyponatremia- IVF gentle  DM- half of lantus dose, SSI  HTN- monitor closely  Chronic systolic heart failure- hold diuretics, gentle IVF  Leukocytosis- ?sinus infection- allergic to levaquin- augmentin PO    Code Status: full Family Communication: patient Disposition Plan:   Time spent: 75 min  Eulogio Bear [facility] Pager [phone]

## 2013-08-29 DIAGNOSIS — I429 Cardiomyopathy, unspecified: Secondary | ICD-10-CM

## 2013-08-29 DIAGNOSIS — N179 Acute kidney failure, unspecified: Secondary | ICD-10-CM | POA: Diagnosis not present

## 2013-08-29 DIAGNOSIS — Z95810 Presence of automatic (implantable) cardiac defibrillator: Secondary | ICD-10-CM

## 2013-08-29 DIAGNOSIS — E871 Hypo-osmolality and hyponatremia: Secondary | ICD-10-CM

## 2013-08-29 DIAGNOSIS — E119 Type 2 diabetes mellitus without complications: Secondary | ICD-10-CM | POA: Diagnosis not present

## 2013-08-29 DIAGNOSIS — R1110 Vomiting, unspecified: Secondary | ICD-10-CM | POA: Diagnosis not present

## 2013-08-29 DIAGNOSIS — E860 Dehydration: Secondary | ICD-10-CM | POA: Diagnosis not present

## 2013-08-29 DIAGNOSIS — R112 Nausea with vomiting, unspecified: Secondary | ICD-10-CM | POA: Diagnosis not present

## 2013-08-29 LAB — CBC
HCT: 43 % (ref 36.0–46.0)
HEMOGLOBIN: 14.6 g/dL (ref 12.0–15.0)
MCH: 30.6 pg (ref 26.0–34.0)
MCHC: 34 g/dL (ref 30.0–36.0)
MCV: 90.1 fL (ref 78.0–100.0)
Platelets: 179 10*3/uL (ref 150–400)
RBC: 4.77 MIL/uL (ref 3.87–5.11)
RDW: 13.6 % (ref 11.5–15.5)
WBC: 11.7 10*3/uL — ABNORMAL HIGH (ref 4.0–10.5)

## 2013-08-29 LAB — BASIC METABOLIC PANEL
BUN: 53 mg/dL — ABNORMAL HIGH (ref 6–23)
CO2: 26 mEq/L (ref 19–32)
Calcium: 9.9 mg/dL (ref 8.4–10.5)
Chloride: 85 mEq/L — ABNORMAL LOW (ref 96–112)
Creatinine, Ser: 1 mg/dL (ref 0.50–1.10)
GFR, EST AFRICAN AMERICAN: 68 mL/min — AB (ref >90)
GFR, EST NON AFRICAN AMERICAN: 59 mL/min — AB (ref >90)
GLUCOSE: 165 mg/dL — AB (ref 70–99)
POTASSIUM: 4.6 meq/L (ref 3.7–5.3)
Sodium: 127 mEq/L — ABNORMAL LOW (ref 137–147)

## 2013-08-29 LAB — GLUCOSE, CAPILLARY
Glucose-Capillary: 178 mg/dL — ABNORMAL HIGH (ref 70–99)
Glucose-Capillary: 208 mg/dL — ABNORMAL HIGH (ref 70–99)
Glucose-Capillary: 167 mg/dL — ABNORMAL HIGH (ref 70–99)
Glucose-Capillary: 229 mg/dL — ABNORMAL HIGH (ref 70–99)

## 2013-08-29 MED ORDER — ZOLPIDEM TARTRATE 5 MG PO TABS: 5.0000 mg | ORAL_TABLET | Freq: Every evening | ORAL | Status: DC | PRN

## 2013-08-29 NOTE — Progress Notes (Signed)
UR completed. Patient changed to inpatient- requiring IVF @ 75cc/hr 

## 2013-08-29 NOTE — Progress Notes (Signed)
[facility] PROGRESS NOTE  Sara Walker [id number] DOB: 03/05/1951 DOA: 08/28/2013 PCP: Horatio Pel, MD  Assessment/Plan: Intractable nausea and vomiting -Suspect nonspecific viral gastritis -Gradually improving without any further vomiting since admission -Advance diet to full liquids -Consider abdominal imaging if vomiting returns -Urinalysis and hepatic enzymes negative Dehydration/acute kidney injury -Likely due to volume depletion -Continue IV fluids -Baseline creatinine 0.8-1.0 -Improving with IV fluids Leukocytosis -Likely due to acute medical illness -Remains afebrile and hemodynamically stable -Gradually improving without any antibiotics -Continue to trend  Chronic systolic CHF -Clinically stable and compensated -Judicious IV fluids Hyponatremia -Likely due to volume depletion Diabetes mellitus type 2 -Continue NovoLog sliding scale -Half home dose of Lantus  Family Communication:   Pt at beside Disposition Plan:   Home 08/30/13 if stable   Antibiotics:  Augmentin 08/28/13    Procedures/Studies: Dg Chest 2 View  08/20/2013   CLINICAL DATA:  Shortness of breath, cough  EXAM: CHEST  2 VIEW  COMPARISON:  DG CHEST 1V PORT dated 12/13/2011  FINDINGS: Stable cardiomegaly. A left chest wall AICD is appreciated lead tips projecting in the region of the right ventricle and right atrium. There is thickening of interstitial markings, peribronchial cuffing, and indistinctness of the pulmonary vasculature. No focal regions of consolidation or focal infiltrates appreciated. Low lung volumes. The osseous structures unremarkable.  IMPRESSION: Interstitial infiltrate likely reflecting pulmonary edema. No focal regions of consolidation appreciated.   Electronically Signed   By: Margaree Mackintosh M.D.   On: 08/20/2013 13:03   Dg Chest Port 1 View  08/28/2013   CLINICAL DATA:  AICD problem.  EXAM: PORTABLE CHEST - 1 VIEW  COMPARISON:  08/20/2013  FINDINGS:  Underpenetration somewhat limits evaluation of the a left approach biventricular AICD/ pacer. The lead orientation is stable from prior. No evidence of lead discontinuity. Battery pack has a similar orientation.  Chronic cardiopericardial enlargement. Interstitial prominence at the bases likely from hypoaeration and portable technique. There is mild venous congestion. No consolidation, effusion, or pneumothorax. Large mass at the thoracic inlet, displacing the trachea to the left. The finding is present since at least 2011, and unchanged from 2013. There is history goiter in the electronic medical record.  IMPRESSION: 1. Unchanged appearance of left approach biventricular ICD/ pacer. 2. Venous congestion without pulmonary edema. 3. Chronic mass at the right thoracic inlet, likely thyroidal in this patient with history of goiter.   Electronically Signed   By: Jorje Guild M.D.   On: 08/28/2013 12:26         Subjective: Patient is feeling better this morning. Denies any fevers, chills, chest discomfort, shortness breath, vomiting, diarrhea, abdominal pain. No dysuria or hematuria. She is a little nauseous. Vomiting has improved. No hematemesis.   Objective: Filed Vitals:   08/28/13 1800 08/28/13 1852 08/28/13 2125 08/29/13 0706  BP: 96/67 117/82 113/87 113/67  Pulse: 79 88 106 82  Temp:  99.1 F (37.3 C) 98.6 F (37 C) 98.1 F (36.7 C)  TempSrc:  Oral Oral Oral  Resp: 19 20 20 20   Height:  5\' 8"  (1.727 m)    Weight:  118 kg (260 lb 2.3 oz) 136.124 kg (300 lb 1.6 oz) 117.935 kg (260 lb)  SpO2: 92% 94% 95% 95%    Intake/Output Summary (Last 24 hours) at 08/29/13 0920 Last data filed at 08/29/13 0700  Gross per 24 hour  Intake 1192.5 ml  Output   1150 ml  Net   42.5 ml   Weight change:  Exam:  General:  Pt is alert, follows commands appropriately, not in acute distress  HEENT: No icterus, No thrush,  [place]/AT  Cardiovascular: RRR, S1/S2, no rubs, no gallops  Respiratory: CTA  bilaterally, no wheezing, no crackles, no rhonchi  Abdomen: Soft/+BS, non tender, non distended, no guarding  Extremities: 1+LE edema, No lymphangitis, No petechiae, No rashes, no synovitis  Data Reviewed: Basic Metabolic Panel:  Recent Labs Lab 08/23/13 1028 08/28/13 1230 08/29/13 0720  NA 131* 126* 127*  K 3.8 3.0* 4.6  CL 80* 75* 85*  CO2 29 32 26  GLUCOSE 125* 202* 165*  BUN 31* 76* 53*  CREATININE 1.22* 1.53* 1.00  CALCIUM 10.3 10.3 9.9  MG  --  2.7*  --    Liver Function Tests:  Recent Labs Lab 08/28/13 1230  AST 21  ALT 15  ALKPHOS 111  BILITOT 1.6*  PROT 8.0  ALBUMIN 3.9    Recent Labs Lab 08/28/13 1230  LIPASE 40   No results found for this basename: AMMONIA,  in the last 168 hours CBC:  Recent Labs Lab 08/28/13 1230 08/29/13 0720  WBC 15.1* 11.7*  NEUTROABS 12.6*  --   HGB 15.4* 14.6  HCT 43.4 43.0  MCV 88.8 90.1  PLT 188 179   Cardiac Enzymes:  Recent Labs Lab 08/28/13 1215  TROPONINI <0.30   BNP: No components found with this basename: POCBNP,  CBG:  Recent Labs Lab 08/28/13 2156 08/29/13 0700  GLUCAP 250* 178*    No results found for this or any previous visit (from the past 240 hour(s)).   Scheduled Meds: . sodium chloride   Intravenous STAT  . sodium chloride   Intravenous STAT  . amoxicillin-clavulanate  1 tablet Oral Q12H  . carvedilol  40 mg Oral Daily  . enoxaparin (LOVENOX) injection  30 mg Subcutaneous Q24H  . insulin aspart  0-5 Units Subcutaneous QHS  . insulin aspart  0-9 Units Subcutaneous TID WC  . insulin glargine  30 Units Subcutaneous Daily  . sodium chloride  3 mL Intravenous Q12H   Continuous Infusions: . 0.9 % NaCl with KCl 20 mEq / L 75 mL/hr at 08/28/13 2030     Makayla Lanter, DO  [facility] Pager [phone]  If 7PM-7AM, please contact night-coverage [email or web address] Password [email or web address] 08/29/2013, 9:20 AM   LOS: 1 day

## 2013-08-29 NOTE — Consult Note (Signed)
Reason for Consult: CHF pt  Requesting Physician: Mary Sella  HPI: This is a 63 y.o. female followed by Dr Sallyanne Kuster who has a severe dilated cardiomyopathy with an ejection fraction of 20-25%, secondary to anthracycline chemotherapy for breast cancer. She has a CRT-D device ([employer]) implanted in 2013 that appears to be functioning well, with virtually 100% biventricular pacing efficiency.  She has been on fairly good doses of carvedilol, aldosterone antagonists but is intolerant to both ACE inhibitors and angiotensin receptor blockers secondary to a persistent cough. She recently saw Dr Sallyanne Kuster 08/20/13 and was felt to be volume overloaded at a wgt of 279 lbs. She was given an Rx for Zaroxolyn 5 mg with instructions to take until her wgt came down to 20 lbs. She has chronically had large wgt fluctuations. She says her wgt quickly came down to 260 and she had problems with nausea and constipation. She cut the dose in half for 2 days then stopped it altogether. She presented to the ER yesterday with nausea, vomiting, poor po intake, and was felt to be dehydrated. Her diuretics have been held. Her admission wgt was 260 lbs.    PMHx:  Past Medical History  Diagnosis Date  . Goiter   . DM2 (diabetes mellitus, type 2)   . Chronic systolic heart failure   . Invasive ductal carcinoma of breast   . Gout   . Hyperlipidemia   . Hypertension   . Breast cancer   . LBBB (left bundle branch block)     evidnece of RBBB fall 2012  . Cardiomyopathy secondary to chemotherapy     a.  doxurubicin;  b. s/p SJM Quadra Assura CRT-D, model [id number] Q, Ser# [id number]  . Syncope   . PONV (postoperative nausea and vomiting)     nausea post ICD placement relieved with Zofran  . Coronary artery disease   . Angina   . CHF (congestive heart failure)   . Shortness of breath   . Nonischemic cardiomyopathy     anthracycline-related  . Automatic implantable cardioverter-defibrillator in situ   . Pacemaker    Past  Surgical History  Procedure Laterality Date  . Mastectomy      right  . Tunneled venous catheter placement      removed  . Breast surgery      Left mastectomy  . Cardiac defibrillator placement      St.Jude Quadra  . Cesarean section  [place]  . Dilation and curettage of uterus  1975  . Portacath placement  1991  . Foot surgery  1994  . Nm myocar perf wall motion  12/01/2011    no ischemia; EF 22%    FAMHx: HTN, DM   SOCHx:  reports that she has quit smoking. She has never used smokeless tobacco. She reports that she does not drink alcohol or use illicit drugs.  ALLERGIES: Allergies  Allergen Reactions  . Lasix [Furosemide]     Causes gout  . Ace Inhibitors Cough  . Codeine Other (See Comments)    Crazy  . Digitalis Other (See Comments)    "saw yellow circles"  . Levaquin [Levofloxacin In D5w]     Gout   . Other Other (See Comments)    Nitro spray: reaction unknown  . Statins Other (See Comments)    Reaction unknown    ROS: Pertinent items are noted in HPI. see H&P for complete details  HOME MEDICATIONS: Prescriptions prior to admission  Medication Sig Dispense Refill  . allopurinol (  ZYLOPRIM) 300 MG tablet Take 450 mg once a day      . carvedilol (COREG CR) 40 MG 24 hr capsule Take 40 mg by mouth daily.      . colchicine 0.6 MG tablet Take 0.6 mg by mouth daily as needed (for pain).       Marland Kitchen LANTUS SOLOSTAR 100 UNIT/ML SOPN Inject 63 Units into the skin daily.       Marland Kitchen spironolactone (ALDACTONE) 50 MG tablet Take 1 tablet (50 mg total) by mouth daily.  90 tablet  3  . torsemide (DEMADEX) 100 MG tablet Take 50 mg by mouth 2 (two) times daily.         HOSPITAL MEDICATIONS: I have reviewed the patient's current medications.  VITALS: Blood pressure 113/67, pulse 82, temperature 98.1 F (36.7 C), temperature source Oral, resp. rate 20, height _0  (1.727 m), weight 260 lb (117.935 kg), SpO2 95.00%.  PHYSICAL EXAM: General appearance: alert, cooperative,  no distress and morbidly obese Neck: no carotid bruit and no JVD Lungs: decreased breath sounds Heart: regular rate and rhythm Abdomen: obese Extremities: no edema Pulses: 2+ and symmetric Skin: Skin color, texture, turgor normal. No rashes or lesions Neurologic: Grossly normal  LABS: Results for orders placed during the hospital encounter of 08/28/13 (from the past 48 hour(s))  TROPONIN I     Status: None   Collection Time    08/28/13 12:15 PM      Result Value Ref Range   Troponin I <0.30  <0.30 ng/mL   Comment:            Due to the release kinetics of cTnI,     a negative result within the first hours     of the onset of symptoms does not rule out     myocardial infarction with certainty.     If myocardial infarction is still suspected,     repeat the test at appropriate intervals.  PRO B NATRIURETIC PEPTIDE     Status: Abnormal   Collection Time    08/28/13 12:15 PM      Result Value Ref Range   Pro B Natriuretic peptide (BNP) 4511.0 (*) 0 - 125 pg/mL  COMPREHENSIVE METABOLIC PANEL     Status: Abnormal   Collection Time    08/28/13 12:30 PM      Result Value Ref Range   Sodium 126 (*) 137 - 147 mEq/L   Potassium 3.0 (*) 3.7 - 5.3 mEq/L   Chloride 75 (*) 96 - 112 mEq/L   CO2 32  19 - 32 mEq/L   Glucose, Bld 202 (*) 70 - 99 mg/dL   BUN 76 (*) 6 - 23 mg/dL   Creatinine, Ser 1.53 (*) 0.50 - 1.10 mg/dL   Calcium 10.3  8.4 - 10.5 mg/dL   Total Protein 8.0  6.0 - 8.3 g/dL   Albumin 3.9  3.5 - 5.2 g/dL   AST 21  0 - 37 U/L   ALT 15  0 - 35 U/L   Alkaline Phosphatase 111  39 - 117 U/L   Total Bilirubin 1.6 (*) 0.3 - 1.2 mg/dL   GFR calc non Af Amer 35 (*) >90 mL/min   GFR calc Af Amer 41 (*) >90 mL/min   Comment: (NOTE)     The eGFR has been calculated using the CKD EPI equation.     This calculation has not been validated in all clinical situations.     eGFR's persistently <90 mL/min signify  possible Chronic Kidney     Disease.  CBC WITH DIFFERENTIAL     Status:  Abnormal   Collection Time    08/28/13 12:30 PM      Result Value Ref Range   WBC 15.1 (*) 4.0 - 10.5 K/uL   RBC 4.89  3.87 - 5.11 MIL/uL   Hemoglobin 15.4 (*) 12.0 - 15.0 g/dL   HCT 43.4  36.0 - 46.0 %   MCV 88.8  78.0 - 100.0 fL   MCH 31.5  26.0 - 34.0 pg   MCHC 35.5  30.0 - 36.0 g/dL   RDW 13.5  11.5 - 15.5 %   Platelets 188  150 - 400 K/uL   Neutrophils Relative % 84 (*) 43 - 77 %   Neutro Abs 12.6 (*) 1.7 - 7.7 K/uL   Lymphocytes Relative 7 (*) 12 - 46 %   Lymphs Abs 1.1  0.7 - 4.0 K/uL   Monocytes Relative 9  3 - 12 %   Monocytes Absolute 1.4 (*) 0.1 - 1.0 K/uL   Eosinophils Relative 0  0 - 5 %   Eosinophils Absolute 0.0  0.0 - 0.7 K/uL   Basophils Relative 0  0 - 1 %   Basophils Absolute 0.0  0.0 - 0.1 K/uL  LIPASE, BLOOD     Status: None   Collection Time    08/28/13 12:30 PM      Result Value Ref Range   Lipase 40  11 - 59 U/L  MAGNESIUM     Status: Abnormal   Collection Time    08/28/13 12:30 PM      Result Value Ref Range   Magnesium 2.7 (*) 1.5 - 2.5 mg/dL  URINALYSIS, ROUTINE W REFLEX MICROSCOPIC     Status: Abnormal   Collection Time    08/28/13  2:53 PM      Result Value Ref Range   Color, Urine YELLOW  YELLOW   APPearance HAZY (*) CLEAR   Specific Gravity, Urine 1.014  1.005 - 1.030   pH 5.5  5.0 - 8.0   Glucose, UA NEGATIVE  NEGATIVE mg/dL   Hgb urine dipstick NEGATIVE  NEGATIVE   Bilirubin Urine NEGATIVE  NEGATIVE   Ketones, ur NEGATIVE  NEGATIVE mg/dL   Protein, ur NEGATIVE  NEGATIVE mg/dL   Urobilinogen, UA 0.2  0.0 - 1.0 mg/dL   Nitrite NEGATIVE  NEGATIVE   Leukocytes, UA NEGATIVE  NEGATIVE   Comment: MICROSCOPIC NOT DONE ON URINES WITH NEGATIVE PROTEIN, BLOOD, LEUKOCYTES, NITRITE, OR GLUCOSE <1000 mg/dL.  GLUCOSE, CAPILLARY     Status: Abnormal   Collection Time    08/28/13  9:56 PM      Result Value Ref Range   Glucose-Capillary 250 (*) 70 - 99 mg/dL   Comment 1 Notify RN    GLUCOSE, CAPILLARY     Status: Abnormal   Collection Time     08/29/13  7:00 AM      Result Value Ref Range   Glucose-Capillary 178 (*) 70 - 99 mg/dL   Comment 1 Notify RN    BASIC METABOLIC PANEL     Status: Abnormal   Collection Time    08/29/13  7:20 AM      Result Value Ref Range   Sodium 127 (*) 137 - 147 mEq/L   Potassium 4.6  3.7 - 5.3 mEq/L   Chloride 85 (*) 96 - 112 mEq/L   CO2 26  19 - 32 mEq/L   Glucose, Bld 165 (*)  70 - 99 mg/dL   BUN 53 (*) 6 - 23 mg/dL   Comment: RESULT REPEATED AND VERIFIED   Creatinine, Ser 1.00  0.50 - 1.10 mg/dL   Comment: RESULT REPEATED AND VERIFIED   Calcium 9.9  8.4 - 10.5 mg/dL   GFR calc non Af Amer 59 (*) >90 mL/min   GFR calc Af Amer 68 (*) >90 mL/min   Comment: (NOTE)     The eGFR has been calculated using the CKD EPI equation.     This calculation has not been validated in all clinical situations.     eGFR's persistently <90 mL/min signify possible Chronic Kidney     Disease.  CBC     Status: Abnormal   Collection Time    08/29/13  7:20 AM      Result Value Ref Range   WBC 11.7 (*) 4.0 - 10.5 K/uL   RBC 4.77  3.87 - 5.11 MIL/uL   Hemoglobin 14.6  12.0 - 15.0 g/dL   HCT 43.0  36.0 - 46.0 %   MCV 90.1  78.0 - 100.0 fL   MCH 30.6  26.0 - 34.0 pg   MCHC 34.0  30.0 - 36.0 g/dL   RDW 13.6  11.5 - 15.5 %   Platelets 179  150 - 400 K/uL    EKG:   IMAGING: Dg Chest Port 1 View  08/28/2013   CLINICAL DATA:  AICD problem.  EXAM: PORTABLE CHEST - 1 VIEW  COMPARISON:  08/20/2013  FINDINGS: Underpenetration somewhat limits evaluation of the a left approach biventricular AICD/ pacer. The lead orientation is stable from prior. No evidence of lead discontinuity. Battery pack has a similar orientation.  Chronic cardiopericardial enlargement. Interstitial prominence at the bases likely from hypoaeration and portable technique. There is mild venous congestion. No consolidation, effusion, or pneumothorax. Large mass at the thoracic inlet, displacing the trachea to the left. The finding is present since at  least 2011, and unchanged from 2013. There is history goiter in the electronic medical record.  IMPRESSION: 1. Unchanged appearance of left approach biventricular ICD/ pacer. 2. Venous congestion without pulmonary edema. 3. Chronic mass at the right thoracic inlet, likely thyroidal in this patient with history of goiter.   Electronically Signed   By: Jorje Guild M.D.   On: 08/28/2013 12:26    IMPRESSION: Principal Problem:   Nausea & vomiting Active Problems:   Dehydration   Cardiomyopathy secondary to chemotherapy   Chronic systolic heart failure   Biventricular ICD (implantable cardioverter-defibrillator) in place   AKI (acute kidney injury)   DM2 (diabetes mellitus, type 2)   Invasive ductal carcinoma of breast   Hyperlipidemia   Hypertension   LBBB (left bundle branch block)   Hypokalemia   Hyponatremia   Leukocytosis   RECOMMENDATION: Dr Sallyanne Kuster to see.   Time Spent Directly with Patient: 45 minutes  Erlene Quan [phone] beeper 08/29/2013, 9:31 AM  I have seen and examined the patient along with Kerin Ransom, PA.  I have reviewed the chart, notes and new data.  I agree with PA's note.  Key new complaints: feeling a little stronger Key examination changes: hard to evaluate volume status due to severe obesity Key new findings / data: persistent hyponatremia, but markedly improved BUN and creatinine  PLAN: Suspect decompensation due to simultaneous aggressive treatment with diuretics for hypervolemia/CHF exacerbation and gastroenteritis with excessive fluid loss and reduced intake. She appears to be close to euvolemia today, but has persistent hyponatremia.  I would  avoid additional IV fluids and allow spontaneous re-equilibration of fluid and sodium level. Resume loop diuretic only, when Na closer to normal.  Sanda Klein, MD, [facility] [phone] 08/29/2013, 10:57 AM

## 2013-08-29 NOTE — Progress Notes (Signed)
Inpatient Diabetes Program Recommendations  AACE/ADA: New Consensus Statement on Inpatient Glycemic Control (2013)  Target Ranges:  Prepandial:   less than 140 mg/dL      Peak postprandial:   less than 180 mg/dL (1-2 hours)      Critically ill patients:  140 - 180 mg/dL   Results for Sara Walker, Sara Walker (MRN [id number]) as of 08/29/2013 11:57  Ref. Range 08/28/2013 21:56 08/29/2013 07:00 08/29/2013 10:54  Glucose-Capillary Latest Range: 70-99 mg/dL 250 (H) 178 (H) 208 (H)   Diabetes history: DM2 Outpatient Diabetes medications: Lantus 63 units daily Current orders for Inpatient glycemic control: Lantus 30 units daily, Novolog 0-9 units AC, and Novolog 0-5 units HS  Inpatient Diabetes Program Recommendations Correction (SSI): Please consider increasing Novolog correction to moderate scale.  Thanks, Barnie Alderman, RN, MSN, CCRN Diabetes Coordinator Inpatient Diabetes Program [phone] (Team Pager) [phone] ([place] office) [phone] [facility] office)

## 2013-08-29 NOTE — Discharge Summary (Signed)
Physician Discharge Summary  Sara Walker [id number] DOB: [birth year] DOA: 08/28/2013  PCP: Horatio Pel, MD  Admit date: 08/28/2013 Discharge date: 08/30/13 Recommendations for Outpatient Follow-up:  1. Pt will need to follow up with PCP in 2 weeks post discharge 2. Please obtain BMP 09/02/13 3. Please also check CBC to evaluate Hg and Hct levels 4. Follow up Dr. Sallyanne Kuster 09/13/13   Discharge Diagnoses:  Principal Problem:   Nausea & vomiting Active Problems:   DM2 (diabetes mellitus, type 2)   Invasive ductal carcinoma of breast   Hyperlipidemia   Hypertension   LBBB (left bundle branch block)   Cardiomyopathy secondary to chemotherapy   Chronic systolic heart failure   Biventricular ICD (implantable cardioverter-defibrillator) in place   AKI (acute kidney injury)   Dehydration   Hypokalemia   Hyponatremia   Leukocytosis   Recurrent vomiting Intractable nausea and vomiting  -Suspect nonspecific viral gastritis  -Gradually improving without any further vomiting since admission  -Advance diet to full liquids  -Consider abdominal imaging if vomiting returns  -Urinalysis and hepatic enzymes negative the patient continued to clinically improve. Her diet was advanced. She was able to tolerate a cardiac diet without difficulty.  Dehydration/acute kidney injury  -Likely due to volume depletion  -Continue IV fluids  -Baseline creatinine 0.8-1.0  -Improving with IV fluids  -Patient refused her blood work on the day of discharge -Nevertheless, the patient's serum creatinine improved. Serum creatinine 1.00 on 08/29/2013 Leukocytosis  -Likely due to acute medical illness  -Remains afebrile and hemodynamically stable  -Gradually improving without any antibiotics  -Continue to trend  -Patient refused blood work on her day of discharge. However the patient clinically improved  -Augmentin was discontinued. The patient did not have any clinical signs of infectious process.  The patient's coughing was likely due to her fluid overload.  Chronic systolic CHF  -Clinically stable and compensated  -Judicious IV fluids  -Cardiology followed the patient and adjusted her diuretic regimen. Patient was instructed to go home on her usual dose of torsemide. She will continue on spironolactone.  -Case was discussed with Dr. Sallyanne Kuster on the day of discharge.  Hyponatremia  -Likely due to volume depletion  -improving Diabetes mellitus type 2  -Continue NovoLog sliding scale  -Patient was instructed to go home with Lantus 35 units daily.  -This was less than her usual dose at home. The patient was instructed to check her sugars and keep a glycemic log. She should follow up with her primary care physician for further adjustment of her insulin regimen. She stated that she will contact her primary care physician at the beginning of the week.  Family Communication: Pt at beside  Disposition Plan: Home 08/30/13 if stable  Antibiotics:  Augmentin 08/28/13>>08/29/13   Discharge Condition: Stable  Disposition:  Follow-up Information   Follow up with Sanda Klein, MD On 09/13/2013. (8:15)    Specialty:  Cardiology   Contact information:   [address] [place] [address] [phone]       Follow up with Horatio Pel, MD In 1 week.   Specialty:  Internal Medicine   Contact information:   [address] [place] [address] [phone]      home  Diet: Cardiac Wt Readings from Last 3 Encounters:  08/30/13 117.4 kg (258 lb 13.1 oz)  08/20/13 126.644 kg (279 lb 3.2 oz)  03/01/13 115.894 kg (255 lb 8 oz)    History of present illness:  63 y.o. female followed by  Dr Sallyanne Kuster who has a severe dilated cardiomyopathy with an ejection fraction of 20-25%, secondary to anthracycline chemotherapy for breast cancer. She has a CRT-D device (St. Jude) implanted in 2013 that appears to be functioning well, with virtually 100% biventricular  pacing efficiency. She has been on fairly good doses of carvedilol, aldosterone antagonists but is intolerant to both ACE inhibitors and angiotensin receptor blockers secondary to a persistent cough. She recently saw Dr Sallyanne Kuster 08/20/13 and was felt to be volume overloaded at a wgt of 279 lbs. She was given an Rx for Zaroxolyn 5 mg with instructions to take until her wgt came down to 20 lbs. She has chronically had large wgt fluctuations. She says her wgt quickly came down to 260 and she had problems with nausea and constipation. She cut the dose in half for 2 days then stopped it altogether. She presented to the ER 08/28/13 with nausea, vomiting, poor po intake, and was felt to be dehydrated. Her diuretics have been held. Her admission wgt was 260 lbs. the patient had complained of one week duration of nausea and vomiting without any hemoptysis or hematemesis. There was no chest discomfort, shortness breath, fevers, chills, abdominal pain. The patient denied any sick contacts, travels, eating unusual oral foods. There was concern that the patient's defibrillator actually fired. However, when her defibrillator was interrogated, there was no thyromegaly noted. The patient was initially n.p.o. Her diet was gradually advanced to a cardiac diet which the patient tolerated. The patient was noted to be in acute kidney injury. The patient was rehydrated with improvement of her serum creatinine. Cardiology was consulted. They recommended judicious fluids. They actually restarted the patient's diuretics.     Consultants: Cardiology  Discharge Exam: Filed Vitals:   08/30/13 0622  BP: 123/67  Pulse: 77  Temp: 98.5 F (36.9 C)  Resp: 19   Filed Vitals:   08/29/13 1440 08/29/13 1949 08/30/13 0158 08/30/13 0622  BP: 117/63 132/57 127/63 123/67  Pulse: 80 85 78 77  Temp: 98.2 F (36.8 C) 99.8 F (37.7 C) 99.3 F (37.4 C) 98.5 F (36.9 C)  TempSrc: Oral Oral Oral Oral  Resp: 20 20 20 19   Height:       Weight:    117.4 kg (258 lb 13.1 oz)  SpO2: 97% 95% 96% 98%   General: A&O x 3, NAD, pleasant, cooperative Cardiovascular: RRR, no rub, no gallop, no S3 Respiratory: Bibasilar crackles. No wheezing. Good air movement.  Abdomen:soft, nontender, nondistended, positive bowel sounds Extremities: 1 edema, No lymphangitis, no petechiae  Discharge Instructions      Discharge Orders   Future Appointments Provider Department Dept Phone   09/13/2013 8:15 AM Sanda Klein, MD [facility] [phone]   Future Orders Complete By Expires   Diet - low sodium heart healthy  As directed    Increase activity slowly  As directed        Medication List         allopurinol 300 MG tablet  Commonly known as:  ZYLOPRIM  Take 450 mg once a day     carvedilol 40 MG 24 hr capsule  Commonly known as:  COREG CR  Take 40 mg by mouth daily.     colchicine 0.6 MG tablet  Take 0.6 mg by mouth daily as needed (for pain).     Insulin Glargine 100 UNIT/ML Solostar Pen  Commonly known as:  LANTUS SOLOSTAR  Inject 35 Units into the skin daily.     spironolactone  50 MG tablet  Commonly known as:  ALDACTONE  Take 1 tablet (50 mg total) by mouth daily.     torsemide 100 MG tablet  Commonly known as:  DEMADEX  Take 50 mg by mouth 2 (two) times daily.         The results of significant diagnostics from this hospitalization (including imaging, microbiology, ancillary and laboratory) are listed below for reference.    Significant Diagnostic Studies: Dg Chest 2 View  08/20/2013   CLINICAL DATA:  Shortness of breath, cough  EXAM: CHEST  2 VIEW  COMPARISON:  DG CHEST 1V PORT dated 12/13/2011  FINDINGS: Stable cardiomegaly. A left chest wall AICD is appreciated lead tips projecting in the region of the right ventricle and right atrium. There is thickening of interstitial markings, peribronchial cuffing, and indistinctness of the pulmonary vasculature. No focal regions of consolidation or focal  infiltrates appreciated. Low lung volumes. The osseous structures unremarkable.  IMPRESSION: Interstitial infiltrate likely reflecting pulmonary edema. No focal regions of consolidation appreciated.   Electronically Signed   By: Margaree Mackintosh M.D.   On: 08/20/2013 13:03   Dg Chest Port 1 View  08/28/2013   CLINICAL DATA:  AICD problem.  EXAM: PORTABLE CHEST - 1 VIEW  COMPARISON:  08/20/2013  FINDINGS: Underpenetration somewhat limits evaluation of the a left approach biventricular AICD/ pacer. The lead orientation is stable from prior. No evidence of lead discontinuity. Battery pack has a similar orientation.  Chronic cardiopericardial enlargement. Interstitial prominence at the bases likely from hypoaeration and portable technique. There is mild venous congestion. No consolidation, effusion, or pneumothorax. Large mass at the thoracic inlet, displacing the trachea to the left. The finding is present since at least 2011, and unchanged from 2013. There is history goiter in the electronic medical record.  IMPRESSION: 1. Unchanged appearance of left approach biventricular ICD/ pacer. 2. Venous congestion without pulmonary edema. 3. Chronic mass at the right thoracic inlet, likely thyroidal in this patient with history of goiter.   Electronically Signed   By: Jorje Guild M.D.   On: 08/28/2013 12:26     Microbiology: No results found for this or any previous visit (from the past 240 hour(s)).   Labs: Basic Metabolic Panel:  Recent Labs Lab 08/23/13 1028 08/28/13 1230 08/29/13 0720  NA 131* 126* 127*  K 3.8 3.0* 4.6  CL 80* 75* 85*  CO2 29 32 26  GLUCOSE 125* 202* 165*  BUN 31* 76* 53*  CREATININE 1.22* 1.53* 1.00  CALCIUM 10.3 10.3 9.9  MG  --  2.7*  --    Liver Function Tests:  Recent Labs Lab 08/28/13 1230  AST 21  ALT 15  ALKPHOS 111  BILITOT 1.6*  PROT 8.0  ALBUMIN 3.9    Recent Labs Lab 08/28/13 1230  LIPASE 40   No results found for this basename: AMMONIA,  in the  last 168 hours CBC:  Recent Labs Lab 08/28/13 1230 08/29/13 0720  WBC 15.1* 11.7*  NEUTROABS 12.6*  --   HGB 15.4* 14.6  HCT 43.4 43.0  MCV 88.8 90.1  PLT 188 179   Cardiac Enzymes:  Recent Labs Lab 08/28/13 1215  TROPONINI <0.30   BNP: No components found with this basename: POCBNP,  CBG:  Recent Labs Lab 08/29/13 0700 08/29/13 1054 08/29/13 1628 08/29/13 2134 08/30/13 0633  GLUCAP 178* 208* 167* 229* 166*    Time coordinating discharge:  Greater than 30 minutes  Signed:  Zoeya Gramajo, DO [facility] Pager: [phone]  08/30/2013, 10:12 AM

## 2013-08-30 ENCOUNTER — Encounter: Payer: Medicare Other | Admitting: Cardiovascular Disease

## 2013-08-30 ENCOUNTER — Encounter: Payer: Self-pay | Admitting: Cardiovascular Disease

## 2013-08-30 ENCOUNTER — Ambulatory Visit: Payer: Medicare Other | Admitting: Physician Assistant

## 2013-08-30 ENCOUNTER — Other Ambulatory Visit: Payer: Self-pay | Admitting: Cardiology

## 2013-08-30 DIAGNOSIS — N179 Acute kidney failure, unspecified: Secondary | ICD-10-CM | POA: Diagnosis not present

## 2013-08-30 DIAGNOSIS — E860 Dehydration: Secondary | ICD-10-CM

## 2013-08-30 DIAGNOSIS — I5022 Chronic systolic (congestive) heart failure: Secondary | ICD-10-CM

## 2013-08-30 DIAGNOSIS — Z95810 Presence of automatic (implantable) cardiac defibrillator: Secondary | ICD-10-CM | POA: Diagnosis not present

## 2013-08-30 DIAGNOSIS — R0600 Dyspnea, unspecified: Secondary | ICD-10-CM

## 2013-08-30 DIAGNOSIS — R112 Nausea with vomiting, unspecified: Secondary | ICD-10-CM | POA: Diagnosis not present

## 2013-08-30 DIAGNOSIS — J40 Bronchitis, not specified as acute or chronic: Secondary | ICD-10-CM | POA: Diagnosis not present

## 2013-08-30 DIAGNOSIS — E871 Hypo-osmolality and hyponatremia: Secondary | ICD-10-CM

## 2013-08-30 DIAGNOSIS — Z79899 Other long term (current) drug therapy: Secondary | ICD-10-CM

## 2013-08-30 DIAGNOSIS — I447 Left bundle-branch block, unspecified: Secondary | ICD-10-CM

## 2013-08-30 DIAGNOSIS — E119 Type 2 diabetes mellitus without complications: Secondary | ICD-10-CM | POA: Diagnosis not present

## 2013-08-30 LAB — GLUCOSE, CAPILLARY: Glucose-Capillary: 166 mg/dL — ABNORMAL HIGH (ref 70–99)

## 2013-08-30 MED ORDER — INSULIN GLARGINE 100 UNIT/ML SOLOSTAR PEN: 35.0000 [IU] | PEN_INJECTOR | Freq: Every day | SUBCUTANEOUS | Status: DC

## 2013-08-30 MED ORDER — INSULIN GLARGINE 100 UNIT/ML ~~LOC~~ SOLN
35.0000 [IU] | Freq: Every day | SUBCUTANEOUS | Status: DC
Start: 2013-08-31 — End: 2013-08-30

## 2013-08-30 NOTE — Progress Notes (Signed)
Patient's IV and telemetry has been discontinued, patient verbalizes understanding of discharge instructions and will be discharged home. Ruben Im RN

## 2013-08-30 NOTE — Progress Notes (Signed)
Pt discharged home via transport by her daughter.  No voiced complaints

## 2013-08-30 NOTE — Plan of Care (Signed)
Problem: Phase III Progression Outcomes Goal: IV/normal saline lock discontinued Outcome: Not Applicable Date Met:  08/30/13 Home on IV Milrinone via PICC line     

## 2013-08-30 NOTE — Progress Notes (Signed)
Subjective:  She feels much better.   Objective:  Vital Signs in the last 24 hours: Temp:  [98.2 F (36.8 C)-99.8 F (37.7 C)] 98.5 F (36.9 C) (02/20 0622) Pulse Rate:  [77-88] 77 (02/20 0622) Resp:  [19-20] 19 (02/20 0622) BP: (117-132)/(57-79) 123/67 mmHg (02/20 0622) SpO2:  [95 %-100 %] 98 % (02/20 0622) Weight:  [258 lb 13.1 oz (117.4 kg)] 258 lb 13.1 oz (117.4 kg) (02/20 0622)  Intake/Output from previous day:  Intake/Output Summary (Last 24 hours) at 08/30/13 0753 Last data filed at 08/30/13 0600  Gross per 24 hour  Intake   1560 ml  Output    625 ml  Net    935 ml    Physical Exam: General appearance: alert, cooperative and no distress Lungs: clear to auscultation bilaterally Heart: regular rate and rhythm   Rate: 70  Rhythm: paced  Lab Results:  Recent Labs  08/28/13 1230 08/29/13 0720  WBC 15.1* 11.7*  HGB 15.4* 14.6  PLT 188 179    Recent Labs  08/28/13 1230 08/29/13 0720  NA 126* 127*  K 3.0* 4.6  CL 75* 85*  CO2 32 26  GLUCOSE 202* 165*  BUN 76* 53*  CREATININE 1.53* 1.00    Recent Labs  08/28/13 1215  TROPONINI <0.30   No results found for this basename: INR,  in the last 72 hours  Imaging: Imaging results have been reviewed  Cardiac Studies:  Assessment/Plan:   Principal Problem:   Nausea & vomiting Active Problems:   Dehydration   Cardiomyopathy secondary to chemotherapy   Chronic systolic heart failure   Biventricular ICD (implantable cardioverter-defibrillator) in place   AKI (acute kidney injury)   DM2 (diabetes mellitus, type 2)   Invasive ductal carcinoma of breast   Hyperlipidemia   Hypertension   LBBB (left bundle branch block)   Hypokalemia   Hyponatremia   Leukocytosis   Recurrent vomiting    PLAN:  No labs this am. Will discuss resuming diuretics with Dr Sallyanne Kuster. She already has an apt for follow up 3/6 and will keep this. Will arrange for [facility] Monday.  Kerin Ransom PA-C Beeper [id number] 08/30/2013,  7:53 AM   I have seen and examined the patient along with Kerin Ransom PA-C.  I have reviewed the chart, notes and new data.  I agree with PA's note.  PLAN: Substantially better. Discussed need to weigh as soon as she gets home - we will use today's weight as our guide for new "dry weight".   Sanda Klein, MD, [place] [phone] 08/30/2013, 9:32 AM

## 2013-08-30 NOTE — Care Management Note (Signed)
    Page 1 of 1   08/30/2013     11:31:44 AM   CARE MANAGEMENT NOTE 08/30/2013  Patient:  Sara Walker, Sara Walker   Account Number:  [id number]  Date Initiated:  08/30/2013  Documentation initiated by:  [facility]  Subjective/Objective Assessment:   63 y.o. female with an extensive PMHX.  She comes in with a week history of N/V and cough.//Home alone     Action/Plan:   IVF, monitor closely as h/o CHF, hold diureses; gentle IVF; Hypokalemia- replete, check Mg//Home with [facility]   Anticipated DC Date:  08/30/2013   Anticipated DC Plan:  [place]  CM consult      Choice offered to / List presented to:          [facility] arranged  [place] - 11 Patient Refused      Status of service:   Medicare Important Message given?  NA - LOS <3 / Initial given by admissions (If response is "NO", the following Medicare IM given date fields will be blank) Date Medicare IM given:   Date Additional Medicare IM given:    Discharge Disposition:  HOME/SELF CARE  Per UR Regulation:  Reviewed for med. necessity/level of care/duration of stay  If discussed at [place] of Stay Meetings, dates discussed:    Comments:  08/30/13 [place], RN, BSN, [place] [phone] I have recommended that this patient have [place] but she declines at this time. I have discussed the risks and benefits of this service with her. The patient verbalizes understanding.

## 2013-09-02 ENCOUNTER — Telehealth: Payer: Self-pay | Admitting: Cardiology

## 2013-09-02 DIAGNOSIS — R0602 Shortness of breath: Secondary | ICD-10-CM | POA: Diagnosis not present

## 2013-09-02 DIAGNOSIS — Z79899 Other long term (current) drug therapy: Secondary | ICD-10-CM

## 2013-09-02 DIAGNOSIS — R0609 Other forms of dyspnea: Secondary | ICD-10-CM | POA: Diagnosis not present

## 2013-09-02 DIAGNOSIS — R0989 Other specified symptoms and signs involving the circulatory and respiratory systems: Secondary | ICD-10-CM | POA: Diagnosis not present

## 2013-09-02 DIAGNOSIS — I5022 Chronic systolic (congestive) heart failure: Secondary | ICD-10-CM | POA: Diagnosis not present

## 2013-09-02 NOTE — Telephone Encounter (Signed)
Returned call and pt verified x 2.  Pt asked that labs that were ordered be sent to [facility] in [place] on [address].  Pt informed they will be reordered and faxed.  Pt verbalized understanding and agreed w/ plan.  [phone].

## 2013-09-02 NOTE — Telephone Encounter (Signed)
She wants to know if her lab order have been sent to [facility] in [place]. It is on [address],she is suppose to have lab work today. She did not want to go and the order not be there.

## 2013-09-02 NOTE — Telephone Encounter (Signed)
BNP and BMP reordered through [facility].

## 2013-09-02 NOTE — Telephone Encounter (Signed)
Labs ordered for [facility] and faxed.

## 2013-09-03 LAB — BASIC METABOLIC PANEL
BUN/Creatinine Ratio: 23 (ref 11–26)
BUN: 20 mg/dL (ref 8–27)
CO2: 25 mmol/L (ref 18–29)
Calcium: 9.7 mg/dL (ref 8.7–10.3)
Chloride: 88 mmol/L — ABNORMAL LOW (ref 97–108)
Creatinine, Ser: 0.88 mg/dL (ref 0.57–1.00)
GFR calc Af Amer: 81 mL/min/{1.73_m2} (ref >59)
GFR calc non Af Amer: 71 mL/min/{1.73_m2} (ref >59)
Glucose: 163 mg/dL — ABNORMAL HIGH (ref 65–99)
Potassium: 4.7 mmol/L (ref 3.5–5.2)
Sodium: 133 mmol/L — ABNORMAL LOW (ref 134–144)

## 2013-09-03 LAB — BRAIN NATRIURETIC PEPTIDE: BNP: 707.2 pg/mL — ABNORMAL HIGH (ref 0.0–100.0)

## 2013-09-13 ENCOUNTER — Encounter: Payer: Self-pay | Admitting: Cardiovascular Disease

## 2013-09-13 ENCOUNTER — Ambulatory Visit (INDEPENDENT_AMBULATORY_CARE_PROVIDER_SITE_OTHER): Payer: Medicare Other | Admitting: Cardiovascular Disease

## 2013-09-13 VITALS — BP 118/90 | HR 80 | Resp 16 | Ht 68.0 in | Wt 262.5 lb

## 2013-09-13 DIAGNOSIS — I5043 Acute on chronic combined systolic (congestive) and diastolic (congestive) heart failure: Secondary | ICD-10-CM

## 2013-09-13 DIAGNOSIS — I509 Heart failure, unspecified: Secondary | ICD-10-CM

## 2013-09-13 DIAGNOSIS — N179 Acute kidney failure, unspecified: Secondary | ICD-10-CM | POA: Diagnosis not present

## 2013-09-13 DIAGNOSIS — I5022 Chronic systolic (congestive) heart failure: Secondary | ICD-10-CM | POA: Diagnosis not present

## 2013-09-13 NOTE — Assessment & Plan Note (Signed)
This was related to excessive diuresis in the setting of a gastrointestinal illness with nausea/vomiting and diarrhea. It was improving rapidly in the hospital. She has lab test scheduled in a few days with her endocrinology follow up.

## 2013-09-13 NOTE — Assessment & Plan Note (Signed)
Resolved, appears close to euvolemic status today.

## 2013-09-13 NOTE — Progress Notes (Signed)
Patient ID: Sara Walker, female   DOB: September 24, 1950, 63 y.o.   MRN: [id number]      Reason for office visit Recent acute systolic heart failure exacerbation  Mrs. Lague was recently hospitalized. She presented with severe acute exacerbation of heart failure, but had a dramatic response to diuretic therapy and became frankly hypovolemic when she developed a superimposed gastrointestinal illness. She was admitted to the hospital with acute renal insufficiency and hypotension. Her gastrointestinal complaints and her respiratory status have improved significantly. She feels much better and is back to her usual pattern of shortness of breath with moderate exertion. She does not have any edema. She denies orthopnea or PND. Glycemic control remains an issue but is improving as she has drastically improved her diet.  When she was discharged from the hospital, the hospital scale indicated 258 pounds; the next morning her home scale showed 252 pounds. Today she weighed 255 pounds on her home scale. Our office scale indicates 262 pounds.  She has severe nonischemic cardiomyopathy related to previous anthracycline chemotherapy. She showed a good response to cardiac resynchronization therapy and has a St. Jude biV ICD in place.    Allergies  Allergen Reactions  . Lasix [Furosemide]     Causes gout  . Ace Inhibitors Cough  . Codeine Other (See Comments)    Crazy  . Digitalis Other (See Comments)    "saw yellow circles"  . Levaquin [Levofloxacin In D5w]     Gout   . Other Other (See Comments)    Nitro spray: reaction unknown  . Statins Other (See Comments)    Reaction unknown    Current Outpatient Prescriptions  Medication Sig Dispense Refill  . allopurinol (ZYLOPRIM) 300 MG tablet Take 450 mg once a day      . carvedilol (COREG CR) 40 MG 24 hr capsule Take 40 mg by mouth daily.      . colchicine 0.6 MG tablet Take 0.6 mg by mouth daily as needed (for pain).       . Insulin Glargine (LANTUS)  100 UNIT/ML Solostar Pen Inject 40 Units into the skin daily.      Marland Kitchen spironolactone (ALDACTONE) 50 MG tablet Take 1 tablet (50 mg total) by mouth daily.  90 tablet  3  . torsemide (DEMADEX) 100 MG tablet Take 50 mg by mouth 2 (two) times daily.        No current facility-administered medications for this visit.    Past Medical History  Diagnosis Date  . Goiter   . DM2 (diabetes mellitus, type 2)   . Chronic systolic heart failure   . Invasive ductal carcinoma of breast   . Gout   . Hyperlipidemia   . Hypertension   . Breast cancer   . LBBB (left bundle branch block)     evidnece of RBBB fall 2012  . Cardiomyopathy secondary to chemotherapy     a.  doxurubicin;  b. s/p SJM Quadra Assura CRT-D, model [id number] Q, Ser# [id number]  . Syncope   . PONV (postoperative nausea and vomiting)     nausea post ICD placement relieved with Zofran  . Coronary artery disease   . Angina   . CHF (congestive heart failure)   . Shortness of breath   . Nonischemic cardiomyopathy     anthracycline-related  . Automatic implantable cardioverter-defibrillator in situ   . Pacemaker     Past Surgical History  Procedure Laterality Date  . Mastectomy      right  . Tunneled  venous catheter placement      removed  . Breast surgery      Left mastectomy  . Cardiac defibrillator placement      St.Jude Quadra  . Cesarean section  [place]  . Dilation and curettage of uterus  1975  . Portacath placement  1991  . Foot surgery  1994  . Nm myocar perf wall motion  12/01/2011    no ischemia; EF 22%    Family History  Problem Relation Age of Onset  . Heart attack Father   . Hypertension Brother   . Diabetes Brother     History   Social History  . Marital Status: Divorced    Spouse Name: N/A    Number of Children: N/A  . Years of Education: N/A   Occupational History  . Not on file.   Social History Main Topics  . Smoking status: Former Research scientist (life sciences)  . Smokeless tobacco: Never Used  . Alcohol  Use: No  . Drug Use: No  . Sexual Activity: Not Currently   Other Topics Concern  . Not on file   Social History Narrative  . No narrative on file    Review of systems: The patient specifically denies any chest pain at rest or with exertion, dyspnea at rest, orthopnea, paroxysmal nocturnal dyspnea, syncope, palpitations, focal neurological deficits, intermittent claudication, lower extremity edema, unexplained weight gain, cough, hemoptysis or wheezing.  The patient also denies abdominal pain, nausea, vomiting, dysphagia, diarrhea, constipation, polyuria, polydipsia, dysuria, hematuria, frequency, urgency, abnormal bleeding or bruising, fever, chills, unexpected weight changes, mood swings, change in skin or hair texture, change in voice quality, auditory or visual problems, allergic reactions or rashes, new musculoskeletal complaints other than usual "aches and pains".   PHYSICAL EXAM BP 118/90  Pulse 80  Resp 16  Ht 5\' 8"  (1.727 m)  Wt 119.069 kg (262 lb 8 oz)  BMI 39.92 kg/m2 Severely obese General: Alert, oriented x3, no distress Head: no evidence of trauma, PERRL, EOMI, no exophtalmos or lid lag, no myxedema, no xanthelasma; normal ears, nose and oropharynx Neck: normal jugular venous pulsations and no hepatojugular reflux; brisk carotid pulses without delay and no carotid bruits Chest: clear to auscultation, no signs of consolidation by percussion or palpation, normal fremitus, symmetrical and full respiratory excursions Cardiovascular: Unable to locate the apical impulse, regular rhythm, normal first and paradoxically split second heart sounds, no murmurs, rubs or gallops Abdomen: no tenderness or distention, no masses by palpation, no abnormal pulsatility or arterial bruits, normal bowel sounds, no hepatosplenomegaly Extremities: no clubbing, cyanosis or edema; 2+ radial, ulnar and brachial pulses bilaterally; 2+ right femoral, posterior tibial and dorsalis pedis pulses; 2+  left femoral, posterior tibial and dorsalis pedis pulses; no subclavian or femoral bruits Neurological: grossly nonfocal  BMET    Component Value Date/Time   NA 133* 09/02/2013 1017   NA 127* 08/29/2013 0720   K 4.7 09/02/2013 1017   CL 88* 09/02/2013 1017   CO2 25 09/02/2013 1017   GLUCOSE 163* 09/02/2013 1017   GLUCOSE 165* 08/29/2013 0720   BUN 20 09/02/2013 1017   BUN 53* 08/29/2013 0720   CREATININE 0.88 09/02/2013 1017   CREATININE 1.04 08/20/2013 1318   CALCIUM 9.7 09/02/2013 1017   GFRNONAA 71 09/02/2013 1017   GFRAA 81 09/02/2013 1017     ASSESSMENT AND PLAN Acute on chronic combined systolic and diastolic congestive heart failure, NYHA class 4 Resolved, appears close to euvolemic status today.  Chronic systolic heart  failure Back to NYHA functional class II. Appears to be euvolemic when her weight is between 250 and 255 pounds on her home scale. Our office scale seems to report roughly 7 pounds more than her home scale. Unable to take ACE inhibitors or angiotensin II receptor blockers. She is on aldosterone antagonist and carvedilol. Continue daily weights. If she weighs less than 250 pounds take only one torsemide daily. If she weighs over 255 pounds continue the twice daily torsemide and and a single dose of metolazone 2.5 mg for one day. If that fails to bring her back to the target weight, call the office.  AKI (acute kidney injury) This was related to excessive diuresis in the setting of a gastrointestinal illness with nausea/vomiting and diarrhea. It was improving rapidly in the hospital. She has lab test scheduled in a few days with her endocrinology follow up.  No orders of the defined types were placed in this encounter.   Meds ordered this encounter  Medications  . Insulin Glargine (LANTUS) 100 UNIT/ML Solostar Pen    Sig: Inject 40 Units into the skin daily.    Holli Humbles, MD, [place] [phone] office [phone] pager

## 2013-09-13 NOTE — Patient Instructions (Addendum)
If your weight is greater than 255 take 2.5mg  of Metolozone.   If your weight is less than 250 take Torsemide only once a day.  Your physician recommends that you schedule a follow-up appointment in:   4 weeks with Dr. Sallyanne Kuster.

## 2013-09-13 NOTE — Assessment & Plan Note (Signed)
Back to NYHA functional class II. Appears to be euvolemic when her weight is between 250 and 255 pounds on her home scale. Our office scale seems to report roughly 7 pounds more than her home scale. Unable to take ACE inhibitors or angiotensin II receptor blockers. She is on aldosterone antagonist and carvedilol. Continue daily weights. If she weighs less than 250 pounds take only one torsemide daily. If she weighs over 255 pounds continue the twice daily torsemide and and a single dose of metolazone 2.5 mg for one day. If that fails to bring her back to the target weight, call the office.

## 2013-09-16 DIAGNOSIS — M109 Gout, unspecified: Secondary | ICD-10-CM | POA: Diagnosis not present

## 2013-09-16 DIAGNOSIS — E7800 Pure hypercholesterolemia, unspecified: Secondary | ICD-10-CM | POA: Diagnosis not present

## 2013-09-16 DIAGNOSIS — IMO0001 Reserved for inherently not codable concepts without codable children: Secondary | ICD-10-CM | POA: Diagnosis not present

## 2013-09-23 DIAGNOSIS — E1165 Type 2 diabetes mellitus with hyperglycemia: Secondary | ICD-10-CM | POA: Diagnosis not present

## 2013-09-23 DIAGNOSIS — I5022 Chronic systolic (congestive) heart failure: Secondary | ICD-10-CM | POA: Diagnosis not present

## 2013-09-23 DIAGNOSIS — E782 Mixed hyperlipidemia: Secondary | ICD-10-CM | POA: Diagnosis not present

## 2013-09-23 DIAGNOSIS — I10 Essential (primary) hypertension: Secondary | ICD-10-CM | POA: Diagnosis not present

## 2013-09-23 DIAGNOSIS — IMO0002 Reserved for concepts with insufficient information to code with codable children: Secondary | ICD-10-CM | POA: Diagnosis not present

## 2013-10-04 DIAGNOSIS — M2010 Hallux valgus (acquired), unspecified foot: Secondary | ICD-10-CM | POA: Diagnosis not present

## 2013-10-04 DIAGNOSIS — M7750 Other enthesopathy of unspecified foot: Secondary | ICD-10-CM | POA: Diagnosis not present

## 2013-10-04 DIAGNOSIS — L600 Ingrowing nail: Secondary | ICD-10-CM | POA: Diagnosis not present

## 2013-10-04 DIAGNOSIS — E1149 Type 2 diabetes mellitus with other diabetic neurological complication: Secondary | ICD-10-CM | POA: Diagnosis not present

## 2013-10-15 DIAGNOSIS — E049 Nontoxic goiter, unspecified: Secondary | ICD-10-CM | POA: Diagnosis not present

## 2013-10-15 DIAGNOSIS — I509 Heart failure, unspecified: Secondary | ICD-10-CM | POA: Diagnosis not present

## 2013-10-15 DIAGNOSIS — I10 Essential (primary) hypertension: Secondary | ICD-10-CM | POA: Diagnosis not present

## 2013-10-15 DIAGNOSIS — E1169 Type 2 diabetes mellitus with other specified complication: Secondary | ICD-10-CM | POA: Diagnosis not present

## 2013-10-16 ENCOUNTER — Other Ambulatory Visit: Payer: Self-pay | Admitting: Internal Medicine

## 2013-10-16 DIAGNOSIS — E049 Nontoxic goiter, unspecified: Secondary | ICD-10-CM

## 2013-10-18 ENCOUNTER — Telehealth: Payer: Self-pay | Admitting: Cardiovascular Disease

## 2013-10-19 ENCOUNTER — Other Ambulatory Visit: Payer: Self-pay | Admitting: Cardiovascular Disease

## 2013-10-21 ENCOUNTER — Ambulatory Visit: Payer: Medicare Other | Admitting: Cardiovascular Disease

## 2013-10-21 ENCOUNTER — Ambulatory Visit
Admission: RE | Admit: 2013-10-21 | Discharge: 2013-10-21 | Disposition: A | Discharge status: Home / Self Care | Payer: Medicare Other | Source: Ambulatory Visit | Attending: Internal Medicine | Admitting: Internal Medicine

## 2013-10-21 DIAGNOSIS — E049 Nontoxic goiter, unspecified: Secondary | ICD-10-CM

## 2013-10-21 DIAGNOSIS — E042 Nontoxic multinodular goiter: Secondary | ICD-10-CM | POA: Diagnosis not present

## 2013-10-21 NOTE — Telephone Encounter (Signed)
Rx was sent to pharmacy electronically. 

## 2013-10-31 NOTE — Telephone Encounter (Signed)
Closed encounter °

## 2013-11-11 DIAGNOSIS — E782 Mixed hyperlipidemia: Secondary | ICD-10-CM | POA: Diagnosis not present

## 2013-11-11 DIAGNOSIS — I10 Essential (primary) hypertension: Secondary | ICD-10-CM | POA: Diagnosis not present

## 2013-11-11 DIAGNOSIS — E1169 Type 2 diabetes mellitus with other specified complication: Secondary | ICD-10-CM | POA: Diagnosis not present

## 2013-11-21 DIAGNOSIS — M1A00X0 Idiopathic chronic gout, unspecified site, without tophus (tophi): Secondary | ICD-10-CM | POA: Diagnosis not present

## 2013-11-22 ENCOUNTER — Ambulatory Visit (INDEPENDENT_AMBULATORY_CARE_PROVIDER_SITE_OTHER): Payer: Medicare Other | Admitting: Cardiovascular Disease

## 2013-11-22 ENCOUNTER — Encounter: Payer: Self-pay | Admitting: Cardiovascular Disease

## 2013-11-22 VITALS — BP 131/87 | HR 89 | Resp 16 | Ht 68.0 in | Wt 253.6 lb

## 2013-11-22 DIAGNOSIS — Z95810 Presence of automatic (implantable) cardiac defibrillator: Secondary | ICD-10-CM

## 2013-11-22 DIAGNOSIS — I509 Heart failure, unspecified: Secondary | ICD-10-CM

## 2013-11-22 DIAGNOSIS — I5022 Chronic systolic (congestive) heart failure: Secondary | ICD-10-CM | POA: Diagnosis not present

## 2013-11-22 DIAGNOSIS — I429 Cardiomyopathy, unspecified: Secondary | ICD-10-CM

## 2013-11-22 DIAGNOSIS — I5043 Acute on chronic combined systolic (congestive) and diastolic (congestive) heart failure: Secondary | ICD-10-CM | POA: Diagnosis not present

## 2013-11-22 DIAGNOSIS — E871 Hypo-osmolality and hyponatremia: Secondary | ICD-10-CM

## 2013-11-22 DIAGNOSIS — I427 Cardiomyopathy due to drug and external agent: Secondary | ICD-10-CM

## 2013-11-22 DIAGNOSIS — T451X5A Adverse effect of antineoplastic and immunosuppressive drugs, initial encounter: Secondary | ICD-10-CM | POA: Diagnosis not present

## 2013-11-22 LAB — PACEMAKER DEVICE OBSERVATION

## 2013-11-22 NOTE — Patient Instructions (Signed)
Continue with current medication.   Your physician wants you to follow-up in 6 month  Dr Shan Levans will receive a reminder letter in the mail two months in advance. If you don't receive a letter, please call our office to schedule the follow-up appointment.

## 2013-11-23 LAB — MDC_IDC_ENUM_SESS_TYPE_INCLINIC
Battery Remaining Percentage: 72 %
Brady Statistic RA Percent Paced: 1 % — CL
Brady Statistic RV Percent Paced: 99 %
HighPow Impedance: 110 Ohm
Lead Channel Impedance Value: 560 Ohm
Lead Channel Impedance Value: 660 Ohm
Lead Channel Impedance Value: 840 Ohm
Lead Channel Pacing Threshold Amplitude: 0.6 V
Lead Channel Pacing Threshold Amplitude: 0.7 V
Lead Channel Pacing Threshold Amplitude: 2.2 V
Lead Channel Pacing Threshold Pulse Width: 0.5 ms
Lead Channel Pacing Threshold Pulse Width: 0.5 ms
Lead Channel Sensing Intrinsic Amplitude: 12 mV
Lead Channel Sensing Intrinsic Amplitude: 4.1 mV
Lead Channel Setting Pacing Amplitude: 1.75 V
Lead Channel Setting Pacing Amplitude: 2 V
Lead Channel Setting Pacing Amplitude: 3.5 V
Lead Channel Setting Pacing Pulse Width: 0.5 ms
Lead Channel Setting Pacing Pulse Width: 0.5 ms
MDC IDC MSMT LEADCHNL LV PACING THRESHOLD PULSEWIDTH: 0.8 ms
MDC IDC PG SERIAL: 7036093
MDC IDC SET LEADCHNL RV SENSING SENSITIVITY: 0.5 mV
MDC IDC SET ZONE DETECTION INTERVAL: 300 ms
Zone Setting Detection Interval: 250 ms

## 2013-11-24 ENCOUNTER — Encounter: Payer: Self-pay | Admitting: Cardiovascular Disease

## 2013-11-24 NOTE — Progress Notes (Signed)
Patient ID: Sara Walker, female   DOB: 12-18-1950, 63 y.o.   MRN: [id number]      Reason for office visit CHF, chronic systolic and diastolic, CRT-D  Yuval's symptoms and fluid status have stabilized. She has only used metolazone rescue two times in the last two months. NYHA class II. Has a daily unexplained pain across right chest, not associated with meals or exertion. Denies edema. No resting dyspnea. Weight is 2 lb less than last office visit. Estimated "dry weight" is 250-255 lb. Her CorVue thoracic impedance is highly variable.   She is intolerant of furosemide, ACEi/ARB.  She has severe nonischemic cardiomyopathy related to previous anthracycline chemotherapy. She showed a good response to cardiac resynchronization therapy and has a St. Jude biV ICD in place. Most recent EF is 22% by nuclear scintigraphy 2013 (normal perfusion), similar EF by echo.  Her device shows nearly 100% biV pacing and no VT. He had a 24 minute episode of atrial tachycardia on April 27, after which a few other brief episodes lasting 2-4 minutes have occurred sporadically.  She has an enlarging thyroid nodule that may need a biopsy.   Allergies  Allergen Reactions  . Lasix [Furosemide]     Causes gout  . Ace Inhibitors Cough  . Codeine Other (See Comments)    Crazy  . Digitalis Other (See Comments)    "saw yellow circles"  . Levaquin [Levofloxacin In D5w]     Gout   . Other Other (See Comments)    Nitro spray: reaction unknown  . Statins Other (See Comments)    Reaction unknown    Current Outpatient Prescriptions  Medication Sig Dispense Refill  . allopurinol (ZYLOPRIM) 300 MG tablet Take 450 mg once a day      . carvedilol (COREG CR) 40 MG 24 hr capsule Take 40 mg by mouth daily.      . colchicine 0.6 MG tablet Take 0.6 mg by mouth daily as needed (for pain).       . Insulin Glargine (LANTUS) 100 UNIT/ML Solostar Pen Inject 36 Units into the skin 2 (two) times daily.       . metolazone  (ZAROXOLYN) 5 MG tablet Take 5 mg by mouth daily as needed (take if weight is over 255 lbs.).      Marland Kitchen spironolactone (ALDACTONE) 50 MG tablet Take 1 tablet (50 mg total) by mouth daily.  90 tablet  3  . torsemide (DEMADEX) 100 MG tablet TAKE ONE TABLET BY MOUTH ONCE DAILY  90 tablet  3   No current facility-administered medications for this visit.    Past Medical History  Diagnosis Date  . Goiter   . DM2 (diabetes mellitus, type 2)   . Chronic systolic heart failure   . Invasive ductal carcinoma of breast   . Gout   . Hyperlipidemia   . Hypertension   . Breast cancer   . LBBB (left bundle branch block)     evidnece of RBBB fall 2012  . Cardiomyopathy secondary to chemotherapy     a.  doxurubicin;  b. s/p SJM Quadra Assura CRT-D, model [id number] Q, Ser# [id number]  . Syncope   . PONV (postoperative nausea and vomiting)     nausea post ICD placement relieved with Zofran  . Coronary artery disease   . Angina   . CHF (congestive heart failure)   . Shortness of breath   . Nonischemic cardiomyopathy     anthracycline-related  . Automatic implantable cardioverter-defibrillator in situ   .  Pacemaker     Past Surgical History  Procedure Laterality Date  . Mastectomy      right  . Tunneled venous catheter placement      removed  . Breast surgery      Left mastectomy  . Cardiac defibrillator placement      St.Jude Quadra  . Cesarean section  [place]  . Dilation and curettage of uterus  1975  . Portacath placement  1991  . Foot surgery  1994  . Nm myocar perf wall motion  12/01/2011    no ischemia; EF 22%    Family History  Problem Relation Age of Onset  . Heart attack Father   . Hypertension Brother   . Diabetes Brother     History   Social History  . Marital Status: Divorced    Spouse Name: N/A    Number of Children: N/A  . Years of Education: N/A   Occupational History  . Not on file.   Social History Main Topics  . Smoking status: Former Research scientist (life sciences)  .  Smokeless tobacco: Never Used  . Alcohol Use: No  . Drug Use: No  . Sexual Activity: Not Currently   Other Topics Concern  . Not on file   Social History Narrative  . No narrative on file    Review of systems: The patient specifically denies any chest pain with exertion, dyspnea at rest, orthopnea, paroxysmal nocturnal dyspnea, syncope, palpitations, focal neurological deficits, intermittent claudication, lower extremity edema, unexplained weight gain, cough, hemoptysis or wheezing.  The patient also denies abdominal pain, nausea, vomiting, dysphagia, diarrhea, constipation, polyuria, polydipsia, dysuria, hematuria, frequency, urgency, abnormal bleeding or bruising, fever, chills, unexpected weight changes, mood swings, change in skin or hair texture, change in voice quality, auditory or visual problems, allergic reactions or rashes, new musculoskeletal complaints other than usual "aches and pains".   PHYSICAL EXAM BP 131/87  Pulse 89  Resp 16  Ht 5\' 8"  (1.727 m)  Wt 253 lb 9.6 oz (115.032 kg)  BMI 38.57 kg/m2 Severely obese  General: Alert, oriented x3, no distress  Head: no evidence of trauma, PERRL, EOMI, no exophtalmos or lid lag, no myxedema, no xanthelasma; normal ears, nose and oropharynx  Neck: normal jugular venous pulsations and no hepatojugular reflux; brisk carotid pulses without delay and no carotid bruits  Chest: clear to auscultation, no signs of consolidation by percussion or palpation, normal fremitus, symmetrical and full respiratory excursions  Cardiovascular: Unable to locate the apical impulse, regular rhythm, normal first and paradoxically split second heart sounds, no murmurs, rubs or gallops  Abdomen: no tenderness or distention, no masses by palpation, no abnormal pulsatility or arterial bruits, normal bowel sounds, no hepatosplenomegaly  Extremities: no clubbing, cyanosis or edema; 2+ radial, ulnar and brachial pulses bilaterally; 2+ right femoral, posterior  tibial and dorsalis pedis pulses; 2+ left femoral, posterior tibial and dorsalis pedis pulses; no subclavian or femoral bruits  Neurological: grossly nonfocal   EKG: 100% As Vp  BMET    Component Value Date/Time   NA 133* 09/02/2013 1017   NA 127* 08/29/2013 0720   K 4.7 09/02/2013 1017   CL 88* 09/02/2013 1017   CO2 25 09/02/2013 1017   GLUCOSE 163* 09/02/2013 1017   GLUCOSE 165* 08/29/2013 0720   BUN 20 09/02/2013 1017   BUN 53* 08/29/2013 0720   CREATININE 0.88 09/02/2013 1017   CREATININE 1.04 08/20/2013 1318   CALCIUM 9.7 09/02/2013 1017   GFRNONAA 71 09/02/2013 1017  GFRAA 81 09/02/2013 1017     ASSESSMENT AND PLAN Biventricular ICD (implantable cardioverter-defibrillator) in place Normal device function. No permanent change programming was necessary. Continue Merlin Q3 month monitoring. LV lead is pacing M3-RV coil.  Chronic systolic heart failure NYHA class II, appears to be close to euvolemia.  Hyponatremia Reevaluate BMET in a few weeks.   Patient Instructions  Continue with current medication.   Your physician wants you to follow-up in 6 month  Dr Shan Levans will receive a reminder letter in the mail two months in advance. If you don't receive a letter, please call our office to schedule the follow-up appointment.     Orders Placed This Encounter  Procedures  . Implantable device check   Meds ordered this encounter  Medications  . metolazone (ZAROXOLYN) 5 MG tablet    Sig: Take 5 mg by mouth daily as needed (take if weight is over 255 lbs.).    Asher Babilonia  Sanda Klein, MD, [facility] [phone] office [phone] pager

## 2013-11-24 NOTE — Assessment & Plan Note (Signed)
NYHA class II, appears to be close to euvolemia.

## 2013-11-24 NOTE — Assessment & Plan Note (Signed)
Normal device function. No permanent change programming was necessary. Continue Merlin Q3 month monitoring. LV lead is pacing M3-RV coil.

## 2013-11-24 NOTE — Assessment & Plan Note (Signed)
Reevaluate BMET in a few weeks.

## 2013-11-25 ENCOUNTER — Telehealth: Payer: Self-pay | Admitting: *Deleted

## 2013-11-25 DIAGNOSIS — Z79899 Other long term (current) drug therapy: Secondary | ICD-10-CM

## 2013-11-25 DIAGNOSIS — I509 Heart failure, unspecified: Secondary | ICD-10-CM

## 2013-11-25 DIAGNOSIS — R0602 Shortness of breath: Secondary | ICD-10-CM

## 2013-11-25 NOTE — Telephone Encounter (Signed)
Order placed and mailed to patient for labs in one months.

## 2013-11-25 NOTE — Telephone Encounter (Signed)
Message copied by Tressa Busman on Mon Nov 25, 2013  8:14 AM ------      Message from: [place], [place]      Created: Sun Nov 24, 2013  9:44 AM       BMET and pro BNP in a month please ------

## 2013-11-26 ENCOUNTER — Other Ambulatory Visit: Payer: Self-pay | Admitting: Cardiovascular Disease

## 2013-11-26 ENCOUNTER — Telehealth: Payer: Self-pay | Admitting: Cardiovascular Disease

## 2013-11-26 NOTE — Telephone Encounter (Signed)
Rx was sent to pharmacy electronically. 

## 2013-11-26 NOTE — Telephone Encounter (Signed)
Patient would like for [place] to start the prior authorization process for her Coreg.  Her refill is due in June but she knows that we will have to complete a prior authorization because she is unable to take the generic for this medication.  Please contact [facility] in [place], [place].

## 2013-11-27 NOTE — Telephone Encounter (Signed)
Of note, RN inquired of need for prior authorization and pharmacy tech stated no PA is needed

## 2013-11-27 NOTE — Telephone Encounter (Signed)
Called pharmacy to check on status of Coreg (brand name) Rx. Stated it was processed for $88.01 for 90 day supply. RN LM with this information on patient's home number.

## 2013-11-28 DIAGNOSIS — M1A00X0 Idiopathic chronic gout, unspecified site, without tophus (tophi): Secondary | ICD-10-CM | POA: Diagnosis not present

## 2013-12-18 ENCOUNTER — Other Ambulatory Visit: Payer: Self-pay | Admitting: Internal Medicine

## 2013-12-18 DIAGNOSIS — E049 Nontoxic goiter, unspecified: Secondary | ICD-10-CM

## 2014-01-29 DIAGNOSIS — IMO0001 Reserved for inherently not codable concepts without codable children: Secondary | ICD-10-CM | POA: Diagnosis not present

## 2014-01-29 DIAGNOSIS — M1A00X0 Idiopathic chronic gout, unspecified site, without tophus (tophi): Secondary | ICD-10-CM | POA: Diagnosis not present

## 2014-02-04 DIAGNOSIS — E782 Mixed hyperlipidemia: Secondary | ICD-10-CM | POA: Diagnosis not present

## 2014-02-04 DIAGNOSIS — I10 Essential (primary) hypertension: Secondary | ICD-10-CM | POA: Diagnosis not present

## 2014-02-04 DIAGNOSIS — E1169 Type 2 diabetes mellitus with other specified complication: Secondary | ICD-10-CM | POA: Diagnosis not present

## 2014-02-17 DIAGNOSIS — B351 Tinea unguium: Secondary | ICD-10-CM | POA: Diagnosis not present

## 2014-02-17 DIAGNOSIS — M2010 Hallux valgus (acquired), unspecified foot: Secondary | ICD-10-CM | POA: Diagnosis not present

## 2014-02-17 DIAGNOSIS — E1149 Type 2 diabetes mellitus with other diabetic neurological complication: Secondary | ICD-10-CM | POA: Diagnosis not present

## 2014-02-24 ENCOUNTER — Telehealth: Payer: Self-pay | Admitting: Cardiology

## 2014-02-24 ENCOUNTER — Encounter: Payer: Medicare Other | Admitting: *Deleted

## 2014-02-24 NOTE — Telephone Encounter (Signed)
LMOVM reminding pt to send remote transmission.   

## 2014-02-25 ENCOUNTER — Encounter: Payer: Self-pay | Admitting: Cardiology

## 2014-03-04 ENCOUNTER — Telehealth: Payer: Self-pay | Admitting: Cardiovascular Disease

## 2014-03-04 NOTE — Telephone Encounter (Signed)
New message     Want device clinic to know that she transmitted and see if we got the report

## 2014-03-04 NOTE — Telephone Encounter (Signed)
LMOVM informing pt that we have not received transmission.

## 2014-03-12 ENCOUNTER — Telehealth: Payer: Self-pay | Admitting: Cardiovascular Disease

## 2014-03-12 MED ORDER — SPIRONOLACTONE 50 MG PO TABS: 50.0000 mg | ORAL_TABLET | Freq: Every day | ORAL | Status: DC

## 2014-03-12 NOTE — Telephone Encounter (Signed)
Pt said pharmacist told her to call and request a prescription for Spiranolactone 50 mg #90. Please call to [phone].

## 2014-03-12 NOTE — Telephone Encounter (Signed)
Rx was sent to pharmacy electronically. 

## 2014-04-28 ENCOUNTER — Ambulatory Visit
Admission: RE | Admit: 2014-04-28 | Discharge: 2014-04-28 | Disposition: A | Discharge status: Home / Self Care | Payer: Medicare Other | Source: Ambulatory Visit | Attending: Internal Medicine | Admitting: Internal Medicine

## 2014-04-28 DIAGNOSIS — E049 Nontoxic goiter, unspecified: Secondary | ICD-10-CM

## 2014-04-28 DIAGNOSIS — E041 Nontoxic single thyroid nodule: Secondary | ICD-10-CM | POA: Diagnosis not present

## 2014-05-01 DIAGNOSIS — M109 Gout, unspecified: Secondary | ICD-10-CM | POA: Diagnosis not present

## 2014-05-01 DIAGNOSIS — E119 Type 2 diabetes mellitus without complications: Secondary | ICD-10-CM | POA: Diagnosis not present

## 2014-05-01 DIAGNOSIS — I10 Essential (primary) hypertension: Secondary | ICD-10-CM | POA: Diagnosis not present

## 2014-05-06 DIAGNOSIS — I10 Essential (primary) hypertension: Secondary | ICD-10-CM | POA: Diagnosis not present

## 2014-05-06 DIAGNOSIS — E785 Hyperlipidemia, unspecified: Secondary | ICD-10-CM | POA: Diagnosis not present

## 2014-05-06 DIAGNOSIS — E1165 Type 2 diabetes mellitus with hyperglycemia: Secondary | ICD-10-CM | POA: Diagnosis not present

## 2014-05-20 DIAGNOSIS — J019 Acute sinusitis, unspecified: Secondary | ICD-10-CM | POA: Diagnosis not present

## 2014-05-21 ENCOUNTER — Encounter: Payer: Self-pay | Admitting: Cardiovascular Disease

## 2014-05-29 DIAGNOSIS — M1A09X0 Idiopathic chronic gout, multiple sites, without tophus (tophi): Secondary | ICD-10-CM | POA: Diagnosis not present

## 2014-06-10 ENCOUNTER — Ambulatory Visit (INDEPENDENT_AMBULATORY_CARE_PROVIDER_SITE_OTHER): Payer: Medicare Other | Admitting: Cardiovascular Disease

## 2014-06-10 ENCOUNTER — Ambulatory Visit (HOSPITAL_COMMUNITY)
Admission: RE | Admit: 2014-06-10 | Discharge: 2014-06-10 | Disposition: A | Discharge status: Home / Self Care | Payer: Medicare Other | Source: Ambulatory Visit | Attending: Cardiovascular Disease | Admitting: Cardiovascular Disease

## 2014-06-10 ENCOUNTER — Encounter: Payer: Self-pay | Admitting: Cardiovascular Disease

## 2014-06-10 VITALS — BP 116/74 | HR 80 | Resp 16 | Ht 67.5 in | Wt 245.9 lb

## 2014-06-10 DIAGNOSIS — I059 Rheumatic mitral valve disease, unspecified: Secondary | ICD-10-CM

## 2014-06-10 DIAGNOSIS — R55 Syncope and collapse: Secondary | ICD-10-CM

## 2014-06-10 DIAGNOSIS — T451X5A Adverse effect of antineoplastic and immunosuppressive drugs, initial encounter: Secondary | ICD-10-CM

## 2014-06-10 DIAGNOSIS — I427 Cardiomyopathy due to drug and external agent: Secondary | ICD-10-CM | POA: Diagnosis not present

## 2014-06-10 DIAGNOSIS — I447 Left bundle-branch block, unspecified: Secondary | ICD-10-CM | POA: Diagnosis not present

## 2014-06-10 DIAGNOSIS — I480 Paroxysmal atrial fibrillation: Secondary | ICD-10-CM

## 2014-06-10 DIAGNOSIS — I10 Essential (primary) hypertension: Secondary | ICD-10-CM | POA: Diagnosis not present

## 2014-06-10 DIAGNOSIS — I5043 Acute on chronic combined systolic (congestive) and diastolic (congestive) heart failure: Secondary | ICD-10-CM | POA: Diagnosis not present

## 2014-06-10 DIAGNOSIS — I5022 Chronic systolic (congestive) heart failure: Secondary | ICD-10-CM | POA: Diagnosis not present

## 2014-06-10 DIAGNOSIS — Z95810 Presence of automatic (implantable) cardiac defibrillator: Secondary | ICD-10-CM

## 2014-06-10 LAB — MDC_IDC_ENUM_SESS_TYPE_INCLINIC
Battery Remaining Longevity: 44.4 mo
Brady Statistic RA Percent Paced: 0.05 %
HIGH POWER IMPEDANCE MEASURED VALUE: 99 Ohm
Implantable Pulse Generator Serial Number: 7036093
Lead Channel Impedance Value: 400 Ohm
Lead Channel Impedance Value: 650 Ohm
Lead Channel Pacing Threshold Amplitude: 0.625 V
Lead Channel Pacing Threshold Amplitude: 0.875 V
Lead Channel Pacing Threshold Amplitude: 2.25 V
Lead Channel Pacing Threshold Amplitude: 2.25 V
Lead Channel Pacing Threshold Pulse Width: 0.5 ms
Lead Channel Pacing Threshold Pulse Width: 0.5 ms
Lead Channel Pacing Threshold Pulse Width: 0.8 ms
Lead Channel Pacing Threshold Pulse Width: 0.8 ms
Lead Channel Sensing Intrinsic Amplitude: 12 mV
Lead Channel Sensing Intrinsic Amplitude: 3.9 mV
Lead Channel Setting Pacing Amplitude: 2 V
Lead Channel Setting Pacing Pulse Width: 0.5 ms
Lead Channel Setting Pacing Pulse Width: 0.8 ms
Lead Channel Setting Sensing Sensitivity: 0.5 mV
MDC IDC MSMT LEADCHNL LV IMPEDANCE VALUE: 800 Ohm
MDC IDC SESS DTM: 20151201130201
MDC IDC SET LEADCHNL LV PACING AMPLITUDE: 2.75 V
MDC IDC SET LEADCHNL RA PACING AMPLITUDE: 1.875
MDC IDC STAT BRADY RV PERCENT PACED: 98 %
Zone Setting Detection Interval: 250 ms
Zone Setting Detection Interval: 300 ms

## 2014-06-10 MED ORDER — RIVAROXABAN 20 MG PO TABS: 20.0000 mg | ORAL_TABLET | Freq: Every day | ORAL | Status: DC

## 2014-06-10 MED ORDER — METOLAZONE 5 MG PO TABS: 5.0000 mg | ORAL_TABLET | Freq: Every day | ORAL | Status: DC | PRN

## 2014-06-10 MED ORDER — CARVEDILOL PHOSPHATE ER 80 MG PO CP24: 80.0000 mg | ORAL_CAPSULE | Freq: Every day | ORAL | Status: DC

## 2014-06-10 NOTE — Patient Instructions (Signed)
INCREASE Coreg to 80mg  daily.  START Xarelto 20mg  daily.  Your physician has requested that you have an echocardiogram. Echocardiography is a painless test that uses sound waves to create images of your heart. It provides your doctor with information about the size and shape of your heart and how well your heart's chambers and valves are working. This procedure takes approximately one hour. There are no restrictions for this procedure.    Dr. Sallyanne Kuster recommends that you schedule a follow-up appointment in: 3 months.

## 2014-06-10 NOTE — Progress Notes (Signed)
2D Echo Performed 06/10/2014    Marygrace Drought, RCS

## 2014-06-10 NOTE — Progress Notes (Signed)
Patient ID: FLORABELLE CRUMP, female   DOB: 1951-02-27, 63 y.o.   MRN: [id number] Patient ID: OLIVETTE BOISSELLE, female DOB: Feb 21, 1951, 63 y.o. MRN: [id number]   Reason for office visit  Nonischemic cardiomyopathy, chronic systolic and diastolic heart failure, CRT-D device check   Recently, Tatisha has noticed episodes of racing heartbeat, increasing frequency compared to earlier in the year. Throughout the summer, when her mother was ill they were particularly prevalent. Last Tuesday she felt weak and dizzy and had tinnitus. Interrogation of her device shows an episode of paroxysmal atrial fibrillation with rapid ventricular response that coincides with the time of her symptoms. In fact she has had numerous episodes of brief paroxysmal atrial fibrillation over the last 4-5 months. The overall burden is 1.4% since her last interrogation. This is a new arrhythmia for her, although she has had episodes of paroxysmal atrial tachycardia before. She often has rapid ventricular rates in the 130s-140s although most commonly the average ventricular rate seems to be around 90-100. There is slight reduction in the percentage of biventricular pacing during the episodes of arrhythmia.   Otherwise device function is normal. There has been no evidence of reductions in thoracic impedance to suggest hypervolemia over the last 2-3 months. She has 98% overall biventricular pacing. Lead parameters are stable from previous recordings (her LV threshold is a little high at 2.75 V at 0.8 ms). Generator longevity is estimated at around 4 years.   She infrequently needs to take metolazone for volume overload, on the average no more than once a week, none in the last several weeks.  She does not have a history of stroke or TIA. She has never had intracranial or gastrointestinal bleeding.   She has severe nonischemic cardiomyopathy related to previous anthracycline chemotherapy. She showed a good response to cardiac resynchronization  therapy and has a St. Jude biV ICD in place. Most recent EF is 22% by nuclear scintigraphy 2013 (normal perfusion), similar EF by echo same year.  Allergies   Allergen  Reactions   .  Lasix [Furosemide]      Causes gout   .  Ace Inhibitors  Cough   .  Codeine  Other (See Comments)     Crazy   .  Digitalis  Other (See Comments)     "saw yellow circles"   .  Levaquin [Levofloxacin In D5w]      Gout   .  Other  Other (See Comments)     Nitro spray: reaction unknown   .  Statins  Other (See Comments)     Reaction unknown    Current Outpatient Prescriptions   Medication  Sig  Dispense  Refill   .  allopurinol (ZYLOPRIM) 300 MG tablet  Take 450 mg once a day     .  colchicine 0.6 MG tablet  Take 0.6 mg by mouth daily as needed (for pain).     .  COREG CR 40 MG 24 hr capsule  TAKE ONE CAPSULE BY MOUTH IN THE MORNING  90 capsule  3   .  Insulin Zinc Human (NOVOLIN L [place])  Inject into the skin daily.     .  metolazone (ZAROXOLYN) 5 MG tablet  Take 5 mg by mouth daily as needed (take if weight is over 255 lbs.).     Marland Kitchen  spironolactone (ALDACTONE) 50 MG tablet  Take 1 tablet (50 mg total) by mouth daily.  90 tablet  2   .  torsemide (DEMADEX) 100 MG  tablet  TAKE ONE TABLET BY MOUTH ONCE DAILY  90 tablet  3    No current facility-administered medications for this visit.    Past Medical History   Diagnosis  Date   .  Goiter    .  DM2 (diabetes mellitus, type 2)    .  Chronic systolic heart failure    .  Invasive ductal carcinoma of breast    .  Gout    .  Hyperlipidemia    .  Hypertension    .  Breast cancer    .  LBBB (left bundle branch block)      evidnece of RBBB fall 2012   .  Cardiomyopathy secondary to chemotherapy      a. doxurubicin; b. s/p SJM Quadra Assura CRT-D, model [id number] Q, Ser# [id number]   .  Syncope    .  PONV (postoperative nausea and vomiting)      nausea post ICD placement relieved with Zofran   .  Coronary artery disease    .  Angina    .  CHF (congestive heart  failure)    .  Shortness of breath    .  Nonischemic cardiomyopathy      anthracycline-related   .  Automatic implantable cardioverter-defibrillator in situ    .  Pacemaker     Past Surgical History   Procedure  Laterality  Date   .  Mastectomy       right   .  Tunneled venous catheter placement       removed   .  Breast surgery       Left mastectomy   .  Cardiac defibrillator placement       St.Jude Quadra   .  Cesarean section   [place]   .  Dilation and curettage of uterus   1975   .  Portacath placement   1991   .  Foot surgery   1994   .  Nm myocar perf wall motion   12/01/2011     no ischemia; EF 22%    Family History   Problem  Relation  Age of Onset   .  Heart attack  Father    .  Hypertension  Brother    .  Diabetes  Brother     History    Social History   .  Marital Status:  Divorced     Spouse Name:  N/A     Number of Children:  N/A   .  Years of Education:  N/A    Occupational History   .  Not on file.    Social History Main Topics   .  Smoking status:  Former Research scientist (life sciences)   .  Smokeless tobacco:  Never Used   .  Alcohol Use:  No   .  Drug Use:  No   .  Sexual Activity:  Not Currently    Other Topics  Concern   .  Not on file    Social History Narrative    Review of systems:  The patient specifically denies any chest pain with exertion, dyspnea at rest, orthopnea, paroxysmal nocturnal dyspnea, syncope, palpitations, focal neurological deficits, intermittent claudication, lower extremity edema, unexplained weight gain, cough, hemoptysis or wheezing.  The patient also denies abdominal pain, nausea, vomiting, dysphagia, diarrhea, constipation, polyuria, polydipsia, dysuria, hematuria, frequency, urgency, abnormal bleeding or bruising, fever, chills, unexpected weight changes, mood swings, change in skin or hair texture, change in voice quality, auditory  or visual problems, allergic reactions or rashes, new musculoskeletal complaints other than usual "aches  and pains".  PHYSICAL EXAM  BP 116/74 mmHg  Pulse 80  Resp 16  Ht 5' 7.5" (1.715 m)  Wt 245 lb 14.4 oz (111.54 kg)  BMI 37.92 kg/m2  Severely obese  General: Alert, oriented x3, no distress  Head: no evidence of trauma, PERRL, EOMI, no exophtalmos or lid lag, no myxedema, no xanthelasma; normal ears, nose and oropharynx  Neck: normal jugular venous pulsations and no hepatojugular reflux; brisk carotid pulses without delay and no carotid bruits  Chest: clear to auscultation, no signs of consolidation by percussion or palpation, normal fremitus, symmetrical and full respiratory excursions  Cardiovascular: Unable to locate the apical impulse, regular rhythm, normal first and paradoxically split second heart sounds, no murmurs, rubs or gallops  Abdomen: no tenderness or distention, no masses by palpation, no abnormal pulsatility or arterial bruits, normal bowel sounds, no hepatosplenomegaly  Extremities: no clubbing, cyanosis or edema; 2+ radial, ulnar and brachial pulses bilaterally; 2+ right femoral, posterior tibial and dorsalis pedis pulses; 2+ left femoral, posterior tibial and dorsalis pedis pulses; no subclavian or femoral bruits  Neurological: grossly nonfocal  EKG: 100% AV sequential pacing  BMET   Labs (Brief)      Component  Value  Date/Time     NA  133*  09/02/2013 1017     NA  127*  08/29/2013 0720     K  4.7  09/02/2013 1017     CL  88*  09/02/2013 1017     CO2  25  09/02/2013 1017     GLUCOSE  163*  09/02/2013 1017     GLUCOSE  165*  08/29/2013 0720     BUN  20  09/02/2013 1017     BUN  53*  08/29/2013 0720     CREATININE  0.88  09/02/2013 1017     CREATININE  1.04  08/20/2013 1318     CALCIUM  9.7  09/02/2013 1017     GFRNONAA  71  09/02/2013 1017     GFRAA  81  09/02/2013 1017    ASSESSMENT AND PLAN  Paroxysmal atrial fibrillation , nonvalvular This is a new abnormality. Her embolic risk is high (CHADSVasc at least 3). She has had problems with warfarin  anticoagulation the past due to difficulty in controlling prothrombin time levels. She is better suited for a novel anticoagulant. Also increase the beta blocker for better ventricular rate control. At this point the burden of atrial fibrillation is low and true antiarrhythmic therapy is not justified.  Biventricular ICD (implantable cardioverter-defibrillator) in place Normal device function. No permanent change programming was necessary. Continue Merlin Q3 month monitoring. LV lead is pacing M3-RV coil.  Chronic systolic heart failure NYHA class II, appears to be close to euvolemia.    Patient Instructions  Continue with current medication.  Orders Placed This Encounter   Procedures   .  EKG 12-Lead    Meds ordered this encounter   Medications   .  Insulin Zinc Human (NOVOLIN L )     Sig: Inject into the skin daily.    Holli Humbles, MD, [place]  [phone] office  [phone] pager

## 2014-06-12 ENCOUNTER — Telehealth: Payer: Self-pay | Admitting: Cardiovascular Disease

## 2014-06-12 NOTE — Telephone Encounter (Signed)
Pt have her pacemaker hooked up,she was suppose to let you know this.

## 2014-06-13 MED ORDER — RIVAROXABAN 20 MG PO TABS: 20.0000 mg | ORAL_TABLET | Freq: Every day | ORAL | Status: DC

## 2014-06-13 NOTE — Telephone Encounter (Signed)
Informed pt that remote was received. Patient voiced understanding.

## 2014-06-13 NOTE — Telephone Encounter (Signed)
During the conversation with the patient she mentioned that she has been unable to fill her new Rx for Xarelto bc it needs a prior auth. Message sent to [facility] about this. Samples left at front desk for pt. Patient voiced understanding.

## 2014-06-13 NOTE — Telephone Encounter (Signed)
Informed pt that remote was not received and that instead of sending a transmission she only did a maintenance check. Patient voiced understanding and verbal instructions given on how to do manual set up. Patient voiced understanding.

## 2014-06-13 NOTE — Addendum Note (Signed)
Addended by: Shiela Mayer on: 06/13/2014 04:23 PM   Modules accepted: Orders

## 2014-06-16 ENCOUNTER — Telehealth: Payer: Self-pay | Admitting: *Deleted

## 2014-06-16 NOTE — Telephone Encounter (Signed)
Patient walked in requesting information regarding Xarelto - she states she did not receive an Rx for this medication and needs to know how to take it - no instructions were provided with the samples left at front desk for her. She has #15 samples. She will need prior approval for xarelto 20mg .

## 2014-06-17 ENCOUNTER — Encounter: Payer: Self-pay | Admitting: Internal Medicine

## 2014-06-17 ENCOUNTER — Telehealth: Payer: Self-pay | Admitting: *Deleted

## 2014-06-17 NOTE — Telephone Encounter (Signed)
PA faxed to [facility] for Xarelto 20mg  daily.

## 2014-06-19 ENCOUNTER — Encounter (HOSPITAL_COMMUNITY): Payer: Self-pay | Admitting: Internal Medicine

## 2014-06-20 ENCOUNTER — Telehealth: Payer: Self-pay | Admitting: *Deleted

## 2014-06-20 NOTE — Telephone Encounter (Signed)
Xarelto 20mg  authorized through 06/18/2015.

## 2014-06-26 DIAGNOSIS — E785 Hyperlipidemia, unspecified: Secondary | ICD-10-CM | POA: Diagnosis not present

## 2014-06-26 DIAGNOSIS — E1165 Type 2 diabetes mellitus with hyperglycemia: Secondary | ICD-10-CM | POA: Diagnosis not present

## 2014-06-26 DIAGNOSIS — I10 Essential (primary) hypertension: Secondary | ICD-10-CM | POA: Diagnosis not present

## 2014-06-27 DIAGNOSIS — E1165 Type 2 diabetes mellitus with hyperglycemia: Secondary | ICD-10-CM | POA: Diagnosis not present

## 2014-08-07 DIAGNOSIS — I10 Essential (primary) hypertension: Secondary | ICD-10-CM | POA: Diagnosis not present

## 2014-08-07 DIAGNOSIS — E1165 Type 2 diabetes mellitus with hyperglycemia: Secondary | ICD-10-CM | POA: Diagnosis not present

## 2014-08-15 ENCOUNTER — Telehealth: Payer: Self-pay | Admitting: *Deleted

## 2014-08-15 ENCOUNTER — Encounter: Payer: Self-pay | Admitting: Cardiovascular Disease

## 2014-08-15 DIAGNOSIS — I472 Ventricular tachycardia, unspecified: Secondary | ICD-10-CM

## 2014-08-15 LAB — BASIC METABOLIC PANEL
BUN/Creatinine Ratio: 48 — ABNORMAL HIGH (ref 11–26)
BUN: 59 mg/dL — ABNORMAL HIGH (ref 8–27)
CO2: 32 mmol/L — ABNORMAL HIGH (ref 18–29)
CREATININE: 1.23 mg/dL — AB (ref 0.57–1.00)
Calcium: 9.6 mg/dL (ref 8.7–10.3)
Chloride: 80 mmol/L — ABNORMAL LOW (ref 97–108)
GFR calc Af Amer: 54 mL/min/{1.73_m2} — ABNORMAL LOW (ref >59)
GFR calc non Af Amer: 47 mL/min/{1.73_m2} — ABNORMAL LOW (ref >59)
GLUCOSE: 316 mg/dL — AB (ref 65–99)
Potassium: 3.1 mmol/L — ABNORMAL LOW (ref 3.5–5.2)
SODIUM: 126 mmol/L — AB (ref 134–144)

## 2014-08-15 LAB — MAGNESIUM: Magnesium: 1.9 mg/dL (ref 1.6–2.3)

## 2014-08-15 MED ORDER — POTASSIUM CHLORIDE CRYS ER 20 MEQ PO TBCR: 20.0000 meq | EXTENDED_RELEASE_TABLET | Freq: Two times a day (BID) | ORAL | Status: DC

## 2014-08-15 NOTE — Telephone Encounter (Signed)
Confirmed that lab order was received by DeeDee at [facility]. She is aware that this is a stat order. [facility] fax was given and DeeDee is aware to send it to [facility] attention.

## 2014-08-15 NOTE — Telephone Encounter (Signed)
Informed patient that lab results have not been received as of yet and that if she does not hear anything from the ofc by the end of business day today to call the main number in the am to talk to the NP/PA on call. Patient voiced understanding.   Patient also admitted that she has had some occas CP since last ov. I informed Dr.Croitoru via txt and his v.o was for her to go to [facility] ED. I explained this to patient and the reasons behind his concern, but she refused to go several times. She then  voiced understanding of the risks of her decision.

## 2014-08-15 NOTE — Telephone Encounter (Signed)
Informed patient that she is not supposed to drive x 6 months. Patient voiced understanding.

## 2014-08-15 NOTE — Telephone Encounter (Signed)
Please let her know that her labs need to be addressed today. I will keep checking for the results, but if not back today, she should call the on call PA/NP in the morning for advice.

## 2014-08-15 NOTE — Telephone Encounter (Signed)
Informed patient of MC's orders to hold her Metolazone for the weekend and start Potassium 20MEQ BID x today, tomorrow, and Sunday and recheck BMP Monday. Patient voiced understanding of instructions given.

## 2014-08-15 NOTE — Telephone Encounter (Signed)
Informed patient that stat lab order is being sent to [facility] in [place]. She states that she will be there in 5 mins to have labs done.

## 2014-08-15 NOTE — Telephone Encounter (Signed)
===  View-only below this line===  ----- Message -----    From: Sanda Klein, MD    Sent: 08/15/2014   2:54 PM      To: Sara Walker, CMA  Her potassium is low at 3.1, quite likely the reason for her arrhythmia. She needs to skip the metolazone this weekend and to take potassium chloride 20 mEq twice daily Friday, Saturday and Sunday and recheck bmet on Monday. Magnesium was okay

## 2014-08-15 NOTE — Telephone Encounter (Signed)
Spoke to patient about ICD shocks from early am of 2/4. Patient has no recollection of receiving the ICD shocks, but admits to waking up at 2:50am with nausea, no vomit. Patient states that she feels "great" today. Prior to episode pt denies any ongoing episodes of nausea, diarrhea, or vomit. Patient states that the only changes to her routine have been her Insulin dosages. MC informed of episodes via text. Plan to order stat labs which pt knows to have done today.

## 2014-08-19 ENCOUNTER — Telehealth: Payer: Self-pay | Admitting: Cardiovascular Disease

## 2014-08-19 ENCOUNTER — Other Ambulatory Visit: Payer: Self-pay | Admitting: Cardiology

## 2014-08-19 ENCOUNTER — Other Ambulatory Visit: Payer: Self-pay | Admitting: Cardiovascular Disease

## 2014-08-19 ENCOUNTER — Other Ambulatory Visit: Payer: Self-pay | Admitting: *Deleted

## 2014-08-19 DIAGNOSIS — I472 Ventricular tachycardia, unspecified: Secondary | ICD-10-CM

## 2014-08-19 DIAGNOSIS — I5022 Chronic systolic (congestive) heart failure: Secondary | ICD-10-CM

## 2014-08-19 DIAGNOSIS — Z79899 Other long term (current) drug therapy: Secondary | ICD-10-CM

## 2014-08-19 DIAGNOSIS — E871 Hypo-osmolality and hyponatremia: Secondary | ICD-10-CM

## 2014-08-19 LAB — BASIC METABOLIC PANEL
BUN/Creatinine Ratio: 30 — ABNORMAL HIGH (ref 11–26)
BUN: 29 mg/dL — ABNORMAL HIGH (ref 8–27)
CO2: 31 mmol/L — AB (ref 18–29)
Calcium: 10 mg/dL (ref 8.7–10.3)
Chloride: 88 mmol/L — ABNORMAL LOW (ref 97–108)
Creatinine, Ser: 0.96 mg/dL (ref 0.57–1.00)
GFR calc Af Amer: 73 mL/min/{1.73_m2} (ref >59)
GFR calc non Af Amer: 63 mL/min/{1.73_m2} (ref >59)
Glucose: 152 mg/dL — ABNORMAL HIGH (ref 65–99)
Potassium: 3.5 mmol/L (ref 3.5–5.2)
Sodium: 131 mmol/L — ABNORMAL LOW (ref 134–144)

## 2014-08-19 NOTE — Telephone Encounter (Signed)
Returned call to patient she stated Sara Walker already called her and advised her to see PCP.

## 2014-08-19 NOTE — Telephone Encounter (Signed)
Pt called in wanting to speak with [name] in regards to her period starting back after 10+ years and she is concerned because she is on Xarelto. Please f/u with the pt  Thanks

## 2014-08-19 NOTE — Telephone Encounter (Signed)
I informed her that the Xarelto will not cause bleeding, but will make whatever bleeding that she was going to have worse. I encouraged patient to f/u w/Dr.Pharr. Patient voiced understanding.

## 2014-08-22 ENCOUNTER — Telehealth: Payer: Self-pay | Admitting: *Deleted

## 2014-08-22 MED ORDER — POTASSIUM CHLORIDE CRYS ER 20 MEQ PO TBCR: EXTENDED_RELEASE_TABLET | ORAL | Status: DC

## 2014-08-22 NOTE — Telephone Encounter (Signed)
-----   Message from Sanda Klein, MD sent at 08/20/2014  8:38 AM EST ----- Labs are better. Whenever she takes metolazone, needs an extra 40 mEq KCl on that day. Whenever taking more than twice a week, needs labs checked to reducethe risk of more ICD shocks

## 2014-08-22 NOTE — Telephone Encounter (Signed)
Per Dr. Loletha Grayer take potassium 38meq daily and an additional 18meq with Metolazone.  Patient voiced understanding.

## 2014-08-22 NOTE — Progress Notes (Signed)
Probably best to take 20 mEq KCl daily, extra 40 on metolazone days.

## 2014-09-12 ENCOUNTER — Ambulatory Visit (INDEPENDENT_AMBULATORY_CARE_PROVIDER_SITE_OTHER): Payer: Medicare Other | Admitting: Cardiovascular Disease

## 2014-09-12 ENCOUNTER — Encounter: Payer: Self-pay | Admitting: Cardiovascular Disease

## 2014-09-12 VITALS — BP 126/76 | HR 70 | Resp 16 | Ht 67.5 in | Wt 264.7 lb

## 2014-09-12 DIAGNOSIS — R079 Chest pain, unspecified: Secondary | ICD-10-CM | POA: Diagnosis not present

## 2014-09-12 DIAGNOSIS — T451X5A Adverse effect of antineoplastic and immunosuppressive drugs, initial encounter: Secondary | ICD-10-CM | POA: Diagnosis not present

## 2014-09-12 DIAGNOSIS — I5043 Acute on chronic combined systolic (congestive) and diastolic (congestive) heart failure: Secondary | ICD-10-CM | POA: Diagnosis not present

## 2014-09-12 DIAGNOSIS — I5022 Chronic systolic (congestive) heart failure: Secondary | ICD-10-CM

## 2014-09-12 DIAGNOSIS — Z95810 Presence of automatic (implantable) cardiac defibrillator: Secondary | ICD-10-CM | POA: Diagnosis not present

## 2014-09-12 DIAGNOSIS — Z79899 Other long term (current) drug therapy: Secondary | ICD-10-CM | POA: Diagnosis not present

## 2014-09-12 DIAGNOSIS — I427 Cardiomyopathy due to drug and external agent: Secondary | ICD-10-CM

## 2014-09-12 DIAGNOSIS — I10 Essential (primary) hypertension: Secondary | ICD-10-CM

## 2014-09-12 DIAGNOSIS — I480 Paroxysmal atrial fibrillation: Secondary | ICD-10-CM

## 2014-09-12 DIAGNOSIS — I447 Left bundle-branch block, unspecified: Secondary | ICD-10-CM | POA: Diagnosis not present

## 2014-09-12 LAB — MDC_IDC_ENUM_SESS_TYPE_INCLINIC
HighPow Impedance: 70.875
Implantable Pulse Generator Serial Number: 7036093
Lead Channel Impedance Value: 362.5 Ohm
Lead Channel Impedance Value: 525 Ohm
Lead Channel Impedance Value: 712.5 Ohm
Lead Channel Pacing Threshold Amplitude: 0.625 V
Lead Channel Pacing Threshold Amplitude: 0.75 V
Lead Channel Pacing Threshold Amplitude: 2.25 V
Lead Channel Pacing Threshold Amplitude: 2.25 V
Lead Channel Pacing Threshold Pulse Width: 0.5 ms
Lead Channel Pacing Threshold Pulse Width: 0.8 ms
Lead Channel Sensing Intrinsic Amplitude: 12 mV
Lead Channel Setting Pacing Amplitude: 1.75 V
Lead Channel Setting Pacing Amplitude: 2.75 V
Lead Channel Setting Pacing Pulse Width: 0.5 ms
MDC IDC MSMT BATTERY REMAINING LONGEVITY: 42 mo
MDC IDC MSMT LEADCHNL LV PACING THRESHOLD PULSEWIDTH: 0.8 ms
MDC IDC MSMT LEADCHNL RA PACING THRESHOLD PULSEWIDTH: 0.5 ms
MDC IDC MSMT LEADCHNL RA SENSING INTR AMPL: 2.1 mV
MDC IDC SESS DTM: 20160304130656
MDC IDC SET LEADCHNL LV PACING PULSEWIDTH: 0.8 ms
MDC IDC SET LEADCHNL RV PACING AMPLITUDE: 2 V
MDC IDC SET LEADCHNL RV SENSING SENSITIVITY: 0.5 mV
MDC IDC SET ZONE DETECTION INTERVAL: 250 ms
MDC IDC STAT BRADY RA PERCENT PACED: 0.06 %
MDC IDC STAT BRADY RV PERCENT PACED: 99 %
Zone Setting Detection Interval: 300 ms

## 2014-09-12 NOTE — Progress Notes (Signed)
Patient ID: Sara Walker, female   DOB: [birth year], 64 y.o.   MRN: [id number]     Reason for office visit Acute on chronic heart failure, CRT-D device check , recent ICD discharge for ventricular tachycardia  Sara Walker has severe nonischemic cardiomyopathy related to previous anthracycline chemotherapy and showed substantial improvement after cardiac resynchronization therapy from a clinical standpoint, although LVEF remains severely depressed. She has had asymptomatic device detected paroxysmal atrial fibrillation. She does not have a history of stroke TIA or other embolic events and is on chronic anticoagulation with Xarelto.  Sara Walker had 2 defibrillator shocks in the early morning of February 4, for 2 separate episodes of ventricular tachyarrhythmia that occurred within an hour of each other. Remote download of her device showed appropriate discharge for polymorphic ventricular tachycardia. She was unaware of the shocks, but remembers waking up around 3 AM with brief nausea. The next day she was completely asymptomatic. Recently she had had major adjustments to her insulin regimen. We recommended evaluation at the hospital but she declined. Laboratory tests were significant for moderate hypokalemia with a potassium of 3.1, normal magnesium level and slightly elevated creatinine of 1.2. Metolazone was discontinued and additional potassium supplements were given as well as increasing the dose of spironolactone. Lab tests showed improvement in potassium level and renal function.  She has gained substantial weight after discontinuation of metolazone. She now weighs almost 19 pounds more than she did at the beginning of December and roughly 10 pounds more than her target "dry weight" of about 255 pounds. She is known to have very broad oscillations in her weight in response to diuretic therapy and has previously been admitted for metolazone related acute renal failure. She has functional class III exertional  dyspnea and intermittent problems with orthopnea. Last night she was able to lie completely supine in bed. She has developed substantial lower extremity edema.  Interrogation of her defibrillator shows no further episodes of ventricular tachyarrhythmia. Also noted is the absence of any new episodes of paroxysmal atrial fibrillation since February 8. There is 99% biventricular pacing and there is no atrial pacing. The thoracic impedance shows substantial volume overload that has been going on for roughly 3 weeks. This coincides with interruption of metolazone.  She did not go to the heart failure clinic appointment or scheduled the cardiac stress test as recommended. Her last echocardiogram in early December showed left ventricular ejection fraction of 20-25 percent.   Allergies  Allergen Reactions  . Lasix [Furosemide]     Causes gout  . Ace Inhibitors Cough  . Codeine Other (See Comments)    Crazy  . Digitalis Other (See Comments)    "saw yellow circles"  . Levaquin [Levofloxacin In D5w]     Gout   . Other Other (See Comments)    Nitro spray: reaction unknown  . Statins Other (See Comments)    Reaction unknown    Current Outpatient Prescriptions  Medication Sig Dispense Refill  . allopurinol (ZYLOPRIM) 300 MG tablet Take 450 mg once a day    . carvedilol (COREG CR) 80 MG 24 hr capsule Take 1 capsule (80 mg total) by mouth daily. 90 capsule 3  . colchicine 0.6 MG tablet Take 0.6 mg by mouth daily as needed (for pain).     . Insulin Lispro, Human, (HUMALOG [place]) Inject 100-135 Units into the skin daily.    . metolazone (ZAROXOLYN) 5 MG tablet Take 1 tablet (5 mg total) by mouth daily as needed (take if weight  is over 255 lbs.). 90 tablet 3  . potassium chloride SA (K-DUR,KLOR-CON) 20 MEQ tablet Take one tab daily.  Take an extra 28meq when you take Metolazone. 60 tablet 5  . rivaroxaban (XARELTO) 20 MG TABS tablet Take 1 tablet (20 mg total) by mouth daily with supper. 15 tablet 0  .  spironolactone (ALDACTONE) 50 MG tablet Take 1 tablet (50 mg total) by mouth daily. 90 tablet 2  . torsemide (DEMADEX) 100 MG tablet TAKE ONE TABLET BY MOUTH ONCE DAILY 90 tablet 3   No current facility-administered medications for this visit.    Past Medical History  Diagnosis Date  . Goiter   . DM2 (diabetes mellitus, type 2)   . Chronic systolic heart failure   . Invasive ductal carcinoma of breast   . Gout   . Hyperlipidemia   . Hypertension   . Breast cancer   . LBBB (left bundle branch block)     evidnece of RBBB fall 2012  . Cardiomyopathy secondary to chemotherapy     a.  doxurubicin;  b. s/p SJM Quadra Assura CRT-D, model [id number] Q, Ser# [id number]  . Syncope   . PONV (postoperative nausea and vomiting)     nausea post ICD placement relieved with Zofran  . Coronary artery disease   . Angina   . CHF (congestive heart failure)   . Shortness of breath   . Nonischemic cardiomyopathy     anthracycline-related  . Automatic implantable cardioverter-defibrillator in situ   . Pacemaker     Past Surgical History  Procedure Laterality Date  . Mastectomy      right  . Tunneled venous catheter placement      removed  . Breast surgery      Left mastectomy  . Cardiac defibrillator placement      St.Jude Quadra  . Cesarean section  [place]  . Dilation and curettage of uterus  1975  . Portacath placement  1991  . Foot surgery  1994  . Nm myocar perf wall motion  12/01/2011    no ischemia; EF 22%  . Bi-ventricular implantable cardioverter defibrillator N/A 12/12/2011    Procedure: BI-VENTRICULAR IMPLANTABLE CARDIOVERTER DEFIBRILLATOR  (CRT-D);  Surgeon: Deboraha Sprang, MD;  Location: [facility] CATH LAB;  Service: Cardiovascular;  Laterality: N/A;    Family History  Problem Relation Age of Onset  . Heart attack Father   . Hypertension Brother   . Diabetes Brother     History   Social History  . Marital Status: Divorced    Spouse Name: N/A  . Number of Children: N/A  .  Years of Education: N/A   Occupational History  . Not on file.   Social History Main Topics  . Smoking status: Former Research scientist (life sciences)  . Smokeless tobacco: Never Used  . Alcohol Use: No  . Drug Use: No  . Sexual Activity: Not Currently   Other Topics Concern  . Not on file   Social History Narrative    Review of systems: Exertional dyspnea with minimal activity, occasional orthopnea, substantial worsening of edema in the lower extremities, substantial weight gain . The patient specifically denies any chest pain at rest or with exertion, defibrillator discharges, syncope, palpitations, focal neurological deficits, intermittent claudication, cough, hemoptysis or wheezing.  The patient also denies abdominal pain, nausea, vomiting, dysphagia, diarrhea, constipation, polyuria, polydipsia, dysuria, hematuria, frequency, urgency, abnormal bleeding or bruising, fever, chills, unexpected weight changes, mood swings, change in skin or hair texture, change in voice quality, auditory  or visual problems, allergic reactions or rashes, new musculoskeletal complaints other than usual "aches and pains".  PHYSICAL EXAM BP 126/76 mmHg  Pulse 70  Resp 16  Ht 5' 7.5" (1.715 m)  Wt 264 lb 11.2 oz (120.067 kg)  BMI 40.82 kg/m2 Severely obese  General: Alert, oriented x3, no distress  Head: no evidence of trauma, PERRL, EOMI, no exophtalmos or lid lag, no myxedema, no xanthelasma; normal ears, nose and oropharynx  Neck: 7-8 centimeters elevation in jugular venous pulsations and prompt hepatojugular reflux; brisk carotid pulses without delay and no carotid bruits  Chest: clear to auscultation, no signs of consolidation by percussion or palpation, normal fremitus, symmetrical and full respiratory excursions , healthy appearance of the subclavian ICD site Cardiovascular: Unable to locate the apical impulse, regular rhythm, normal first and paradoxically split second heart sounds, no murmurs, rubs or gallops   Abdomen: no tenderness or distention, no masses by palpation, no abnormal pulsatility or arterial bruits, normal bowel sounds, no hepatosplenomegaly  Extremities: no clubbing, cyanosis; symmetrical 2-3 plus pretibial and pedal edema; 2+ radial, ulnar and brachial pulses bilaterally; 2+ right femoral, posterior tibial and dorsalis pedis pulses; 2+ left femoral, posterior tibial and dorsalis pedis pulses; no subclavian or femoral bruits  Neurological: grossly nonfocal   EKG: 100% AV sequential pacing   Lipid Panel  No results found for: CHOL, TRIG, HDL, CHOLHDL, VLDL, LDLCALC, LDLDIRECT  BMET    Component Value Date/Time   NA 131* 08/19/2014 1113   NA 127* 08/29/2013 0720   K 3.5 08/19/2014 1113   CL 88* 08/19/2014 1113   CO2 31* 08/19/2014 1113   GLUCOSE 152* 08/19/2014 1113   GLUCOSE 165* 08/29/2013 0720   BUN 29* 08/19/2014 1113   BUN 53* 08/29/2013 0720   CREATININE 0.96 08/19/2014 1113   CREATININE 1.04 08/20/2013 1318   CALCIUM 10.0 08/19/2014 1113   GFRNONAA 63 08/19/2014 1113   GFRAA 73 08/19/2014 1113     ASSESSMENT AND PLAN  1. Acute on chronic systolic and diastolic heart failure secondary to nonischemic (anthracycline related) cardiomyopathy  Clearly she requires metolazone in addition to loop diuretics to maintain compensation. Will have to add metolazone back at least once a week with very careful monitoring of electrolytes to avoid further ventricular arrhythmia. It seems that her insulin dosage has stabilized and hopefully this will prevent broad swings and electrolytes as well.  Start metolazone once weekly with a target dry weight of less than 255 pounds .  Again recommend enrollment in the heart failure clinic. Despite a considerable time of improved compensation with CRT, she is showing clinical deterioration and might require advanced heart failure therapies in the future.   2. ICD discharge for recurrent polymorphic VT  These were the first appropriate  shocks delivered by her device and occurred in the setting of hypokalemia. No further arrhythmia since correction of electrolyte abnormalities. She understands that she should not drive for 6 months following these shocks.   3. Non-valvular paroxysmal atrial fibrillation  Essentially recent reduction in prevalence of atrial fibrillation suggests that this may also have been part clearly related to hypokalemia. Continue full anticoagulation. Chadsvasc at least 3.   4. Biventricular ICD (implantable cardioverter-defibrillator) in place Normal device function. No permanent change programming was necessary. Continue Merlin Q3 month monitoring. LV lead is pacing M3-RV coil.  Patient Instructions  You have been referred to [place].  Your physician recommends that you return for lab work in: [place].  Dr. Sallyanne Kuster recommends that you schedule a follow-up appointment in: [place].  Start metolazone 5 mg once weekly on any week where her weight exceeds 255 pounds   Orders Placed This Encounter  Procedures  . EKG 12-Lead   Meds ordered this encounter  Medications  . Insulin Lispro, Human, (HUMALOG [place])    Sig: Inject 100-135 Units into the skin daily.    Holli Humbles, MD, [place] [phone] office [phone] pager

## 2014-09-12 NOTE — Patient Instructions (Signed)
You have been referred to [place].  Your physician recommends that you return for lab work in: [place].  Dr. Sallyanne Kuster recommends that you schedule a follow-up appointment in: [place].

## 2014-09-18 DIAGNOSIS — E1165 Type 2 diabetes mellitus with hyperglycemia: Secondary | ICD-10-CM | POA: Diagnosis not present

## 2014-09-18 DIAGNOSIS — I509 Heart failure, unspecified: Secondary | ICD-10-CM | POA: Diagnosis not present

## 2014-09-23 ENCOUNTER — Encounter: Payer: Self-pay | Admitting: Cardiovascular Disease

## 2014-10-02 ENCOUNTER — Ambulatory Visit (HOSPITAL_COMMUNITY)
Admission: RE | Admit: 2014-10-02 | Discharge: 2014-10-02 | Disposition: A | Discharge status: Home / Self Care | Payer: Medicare Other | Source: Ambulatory Visit | Attending: Internal Medicine | Admitting: Internal Medicine

## 2014-10-02 ENCOUNTER — Encounter (INDEPENDENT_AMBULATORY_CARE_PROVIDER_SITE_OTHER): Payer: Self-pay

## 2014-10-02 DIAGNOSIS — I429 Cardiomyopathy, unspecified: Secondary | ICD-10-CM | POA: Diagnosis not present

## 2014-10-02 DIAGNOSIS — E876 Hypokalemia: Secondary | ICD-10-CM | POA: Insufficient documentation

## 2014-10-02 DIAGNOSIS — Z95810 Presence of automatic (implantable) cardiac defibrillator: Secondary | ICD-10-CM | POA: Diagnosis not present

## 2014-10-02 DIAGNOSIS — E785 Hyperlipidemia, unspecified: Secondary | ICD-10-CM | POA: Diagnosis not present

## 2014-10-02 DIAGNOSIS — I447 Left bundle-branch block, unspecified: Secondary | ICD-10-CM | POA: Diagnosis not present

## 2014-10-02 DIAGNOSIS — Z794 Long term (current) use of insulin: Secondary | ICD-10-CM | POA: Diagnosis not present

## 2014-10-02 DIAGNOSIS — I5042 Chronic combined systolic (congestive) and diastolic (congestive) heart failure: Secondary | ICD-10-CM | POA: Diagnosis not present

## 2014-10-02 DIAGNOSIS — I2510 Atherosclerotic heart disease of native coronary artery without angina pectoris: Secondary | ICD-10-CM | POA: Diagnosis not present

## 2014-10-02 DIAGNOSIS — Z87891 Personal history of nicotine dependence: Secondary | ICD-10-CM | POA: Insufficient documentation

## 2014-10-02 DIAGNOSIS — I480 Paroxysmal atrial fibrillation: Secondary | ICD-10-CM | POA: Diagnosis not present

## 2014-10-02 DIAGNOSIS — I5043 Acute on chronic combined systolic (congestive) and diastolic (congestive) heart failure: Secondary | ICD-10-CM

## 2014-10-02 DIAGNOSIS — Z79899 Other long term (current) drug therapy: Secondary | ICD-10-CM | POA: Insufficient documentation

## 2014-10-02 DIAGNOSIS — E119 Type 2 diabetes mellitus without complications: Secondary | ICD-10-CM | POA: Insufficient documentation

## 2014-10-02 DIAGNOSIS — I10 Essential (primary) hypertension: Secondary | ICD-10-CM | POA: Insufficient documentation

## 2014-10-02 DIAGNOSIS — Z7901 Long term (current) use of anticoagulants: Secondary | ICD-10-CM | POA: Insufficient documentation

## 2014-10-02 MED ORDER — LOSARTAN POTASSIUM 25 MG PO TABS: 12.5000 mg | ORAL_TABLET | Freq: Every day | ORAL | Status: DC

## 2014-10-02 MED ORDER — MAGNESIUM OXIDE -MG SUPPLEMENT 200 MG PO TABS: 1.0000 | ORAL_TABLET | Freq: Two times a day (BID) | ORAL | Status: DC

## 2014-10-02 NOTE — Progress Notes (Signed)
Patient ID: AFI CHESTANG, female   DOB: 1950-08-31, 64 y.o.   MRN: [id number]     Reason for office visit  Clementine is a 64 y/o woman with morbid obesity, DM, breast CA, VT and systolic HF due to severe nonischemic cardiomyopathy (EF 20-25% echo 12/15) related to previous anthracycline chemotherapy and showed substantial improvement after cardiac resynchronization therapy from a clinical standpoint, although LVEF remains severely depressed. She has had asymptomatic device detected paroxysmal atrial fibrillation. She does not have a history of stroke TIA or other embolic events and is on chronic anticoagulation with Xarelto. Referred by Dr. Recardo Evangelist to HF Clinic for evaluation of advanced therapies.   She had 2 ICD shocks in the early morning of February 4, for 2 separate episodes of ventricular tachyarrhythmia that occurred within an hour of each other. Remote download of her device showed appropriate discharge for polymorphic ventricular tachycardia. She was unaware of the shocks, but remembers waking up around 3 AM with brief nausea. The next day she was completely asymptomatic. Recently she had had major adjustments to her insulin regimen. Laboratory tests were significant for moderate hypokalemia with a potassium of 3.1, normal magnesium level and slightly elevated creatinine of 1.2. Metolazone was discontinued and additional potassium supplements were given as well as increasing the dose of spironolactone. Lab tests showed improvement in potassium level and renal function.  She presents today as new patient at the request Dr Loleta Chance for heart failure. Ongoing dyspnea with exertion. Having fatigue. Dyspnea with inclines.  Weight at home 250-255 pounds. Taking metolazone 1-2 times every 2-3 weeks.  Able to care for grandchildren 3 days a week. Having irregular vaginal bleeding but she has missed gyn appointments. Taking all medications. 10 years ago diagnosed with OSA but never used CPAP.    Labs 08/19/14  K 3.5 Creatinine 0.96 09/18/2014: K 3.4   Allergies  Allergen Reactions  . Lasix [Furosemide]     Causes gout  . Ace Inhibitors Cough  . Codeine Other (See Comments)    Crazy  . Digitalis Other (See Comments)    "saw yellow circles"  . Levaquin [Levofloxacin In D5w]     Gout   . Other Other (See Comments)    Nitro spray: reaction unknown  . Statins Other (See Comments)    Reaction unknown    Current Outpatient Prescriptions  Medication Sig Dispense Refill  . allopurinol (ZYLOPRIM) 300 MG tablet Take 450 mg once a day    . carvedilol (COREG CR) 80 MG 24 hr capsule Take 1 capsule (80 mg total) by mouth daily. 90 capsule 3  . colchicine 0.6 MG tablet Take 0.6 mg by mouth daily as needed (for pain).     . Insulin Lispro, Human, (HUMALOG [place]) Inject 100-135 Units into the skin daily.    . metolazone (ZAROXOLYN) 5 MG tablet Take 1 tablet (5 mg total) by mouth daily as needed (take if weight is over 255 lbs.). 90 tablet 3  . potassium chloride SA (K-DUR,KLOR-CON) 20 MEQ tablet Take one tab daily.  Take an extra 69meq when you take Metolazone. 60 tablet 5  . rivaroxaban (XARELTO) 20 MG TABS tablet Take 1 tablet (20 mg total) by mouth daily with supper. 15 tablet 0  . spironolactone (ALDACTONE) 50 MG tablet Take 1 tablet (50 mg total) by mouth daily. 90 tablet 2  . torsemide (DEMADEX) 100 MG tablet TAKE ONE TABLET BY MOUTH ONCE DAILY 90 tablet 3  . vitamin B-12 (CYANOCOBALAMIN) 500 MCG tablet Take  500 mcg by mouth daily.     No current facility-administered medications for this encounter.    Past Medical History  Diagnosis Date  . Goiter   . DM2 (diabetes mellitus, type 2)   . Chronic systolic heart failure   . Invasive ductal carcinoma of breast   . Gout   . Hyperlipidemia   . Hypertension   . Breast cancer   . LBBB (left bundle branch block)     evidnece of RBBB fall 2012  . Cardiomyopathy secondary to chemotherapy     a.  doxurubicin;  b. s/p SJM Quadra  Assura CRT-D, model [id number] Q, Ser# [id number]  . Syncope   . PONV (postoperative nausea and vomiting)     nausea post ICD placement relieved with Zofran  . Coronary artery disease   . Angina   . CHF (congestive heart failure)   . Shortness of breath   . Nonischemic cardiomyopathy     anthracycline-related  . Automatic implantable cardioverter-defibrillator in situ   . Pacemaker     Past Surgical History  Procedure Laterality Date  . Mastectomy      right  . Tunneled venous catheter placement      removed  . Breast surgery      Left mastectomy  . Cardiac defibrillator placement      St.Jude Quadra  . Cesarean section  [place]  . Dilation and curettage of uterus  1975  . Portacath placement  1991  . Foot surgery  1994  . Nm myocar perf wall motion  12/01/2011    no ischemia; EF 22%  . Bi-ventricular implantable cardioverter defibrillator N/A 12/12/2011    Procedure: BI-VENTRICULAR IMPLANTABLE CARDIOVERTER DEFIBRILLATOR  (CRT-D);  Surgeon: Deboraha Sprang, MD;  Location: [facility] CATH LAB;  Service: Cardiovascular;  Laterality: N/A;    Family History  Problem Relation Age of Onset  . Heart attack Father   . Hypertension Brother   . Diabetes Brother     History   Social History  . Marital Status: Divorced    Spouse Name: N/A  . Number of Children: N/A  . Years of Education: N/A   Occupational History  . Not on file.   Social History Main Topics  . Smoking status: Former Research scientist (life sciences)  . Smokeless tobacco: Never Used  . Alcohol Use: No  . Drug Use: No  . Sexual Activity: Not Currently   Other Topics Concern  . Not on file   Social History Narrative   PHYSICAL EXAM BP 114/58 mmHg  Pulse 74  Wt 248 lb 8 oz (112.719 kg)  SpO2 96% Severely obese  General: Alert, oriented x3, no distress  Head: no evidence of trauma, PERRL, EOMI, no exophtalmos or lid lag, no myxedema, no xanthelasma; normal ears, nose and oropharynx  Neck: 7-8 centimeters elevation in jugular venous  pulsations and prompt hepatojugular reflux; brisk carotid pulses without delay and no carotid bruits  Chest: clear to auscultation  Cardiovascular: RR no m/r/g  S/p R mastectomy Abdomen: obese. no tenderness or distention, no masses by palpation, no abnormal pulsatility or arterial bruits, normal bowel sounds, no hepatosplenomegaly  Extremities: No lower extremity edema.   Neurological: grossly nonfocal    Lipid Panel  No results found for: CHOL, TRIG, HDL, CHOLHDL, VLDL, LDLCALC, LDLDIRECT  BMET    Component Value Date/Time   NA 131* 08/19/2014 1113   NA 127* 08/29/2013 0720   K 3.5 08/19/2014 1113   CL 88* 08/19/2014 1113   CO2 31*  08/19/2014 1113   GLUCOSE 152* 08/19/2014 1113   GLUCOSE 165* 08/29/2013 0720   BUN 29* 08/19/2014 1113   BUN 53* 08/29/2013 0720   CREATININE 0.96 08/19/2014 1113   CREATININE 1.04 08/20/2013 1318   CALCIUM 10.0 08/19/2014 1113   GFRNONAA 63 08/19/2014 1113   GFRAA 73 08/19/2014 1113     ASSESSMENT AND PLAN  1. Chronic  systolic and diastolic heart failure secondary to nonischemic (anthracycline related) cardiomyopathy (1991). Has ST Jude CRT-D  NYHA III Volume status stable. Continue torsemide 100 mg daily and metolazone as needed for weight greater than 255 pounds/  -Continue carvedilol cr 80 mg daily - Not on dig due to side effects -Not on ace due to cough. Will try losartan 12.5 mg daily Continue 25 spironolactone daily Check CPX 2. ICD discharge for recurrent polymorphic VT in setting of hypokalemia Check BMET and Mag . Start mag ox 200 mg twice a day. Supp K as needed  3. Non-valvular paroxysmal atrial fibrillation   Continue full anticoagulation. Chadsvasc at least 3 4. Biventricular ICD (implantable cardioverter-defibrillator) in place. Continue Merlin Q3 month monitoring. LV lead is pacing M3-RV coil.  AMY CLEGG, NP-C  Patient seen and examined with Darrick Grinder, NP. We discussed all aspects of the encounter. I agree with the  assessment and plan as stated above.   She has been doing relatively well recently but has struggled with her volume status and renal function. Volume status looks ok today.Agree with trying losartan. Long talk about use of sliding scale diuretics. Will proceed with CPX to objectively assess functional status. Recheck electrolytes. Will start MagOx.   Benay Spice 4:35 PM

## 2014-10-02 NOTE — Patient Instructions (Signed)
Start Losartan 12.5 mg (1/2 tab) daily  Start Mag-ox 200 mg Twice daily   Labs in 10 days at [facility], we have given you a prescription for this  Your physician has recommended that you have a cardiopulmonary stress test (CPX). CPX testing is a non-invasive measurement of heart and lung function. It replaces a traditional treadmill stress test. This type of test provides a tremendous amount of information that relates not only to your present condition but also for future outcomes. This test combines measurements of you ventilation, respiratory gas exchange in the lungs, electrocardiogram (EKG), blood pressure and physical response before, during, and following an exercise protocol.  Your physician recommends that you schedule a follow-up appointment in: 2 weeks (after CPX)

## 2014-10-13 ENCOUNTER — Other Ambulatory Visit (HOSPITAL_COMMUNITY): Payer: Self-pay | Admitting: Adult Health

## 2014-10-13 DIAGNOSIS — I5022 Chronic systolic (congestive) heart failure: Secondary | ICD-10-CM | POA: Diagnosis not present

## 2014-10-13 LAB — BASIC METABOLIC PANEL
BUN/Creatinine Ratio: 21 (ref 11–26)
BUN: 22 mg/dL (ref 8–27)
CALCIUM: 9.7 mg/dL (ref 8.7–10.3)
CO2: 30 mmol/L — ABNORMAL HIGH (ref 18–29)
Chloride: 92 mmol/L — ABNORMAL LOW (ref 97–108)
Creatinine, Ser: 1.07 mg/dL — ABNORMAL HIGH (ref 0.57–1.00)
GFR calc Af Amer: 64 mL/min/{1.73_m2} (ref >59)
GFR calc non Af Amer: 55 mL/min/{1.73_m2} — ABNORMAL LOW (ref >59)
Glucose: 257 mg/dL — ABNORMAL HIGH (ref 65–99)
Potassium: 4.1 mmol/L (ref 3.5–5.2)
Sodium: 135 mmol/L (ref 134–144)

## 2014-10-13 LAB — MAGNESIUM: Magnesium: 1.6 mg/dL (ref 1.6–2.3)

## 2014-10-17 ENCOUNTER — Ambulatory Visit (HOSPITAL_COMMUNITY)
Admission: RE | Admit: 2014-10-17 | Discharge: 2014-10-17 | Disposition: A | Discharge status: Home / Self Care | Payer: Medicare Other | Source: Ambulatory Visit | Attending: Cardiology | Admitting: Cardiology

## 2014-10-17 VITALS — BP 116/68 | HR 74 | Wt 256.2 lb

## 2014-10-17 DIAGNOSIS — Z95810 Presence of automatic (implantable) cardiac defibrillator: Secondary | ICD-10-CM | POA: Insufficient documentation

## 2014-10-17 DIAGNOSIS — Z87891 Personal history of nicotine dependence: Secondary | ICD-10-CM | POA: Diagnosis not present

## 2014-10-17 DIAGNOSIS — I472 Ventricular tachycardia, unspecified: Secondary | ICD-10-CM | POA: Insufficient documentation

## 2014-10-17 DIAGNOSIS — E119 Type 2 diabetes mellitus without complications: Secondary | ICD-10-CM | POA: Insufficient documentation

## 2014-10-17 DIAGNOSIS — Z79899 Other long term (current) drug therapy: Secondary | ICD-10-CM | POA: Diagnosis not present

## 2014-10-17 DIAGNOSIS — E876 Hypokalemia: Secondary | ICD-10-CM | POA: Diagnosis not present

## 2014-10-17 DIAGNOSIS — M109 Gout, unspecified: Secondary | ICD-10-CM | POA: Insufficient documentation

## 2014-10-17 DIAGNOSIS — Z794 Long term (current) use of insulin: Secondary | ICD-10-CM | POA: Insufficient documentation

## 2014-10-17 DIAGNOSIS — I480 Paroxysmal atrial fibrillation: Secondary | ICD-10-CM | POA: Insufficient documentation

## 2014-10-17 DIAGNOSIS — I5022 Chronic systolic (congestive) heart failure: Secondary | ICD-10-CM | POA: Diagnosis not present

## 2014-10-17 DIAGNOSIS — I2510 Atherosclerotic heart disease of native coronary artery without angina pectoris: Secondary | ICD-10-CM | POA: Insufficient documentation

## 2014-10-17 DIAGNOSIS — I10 Essential (primary) hypertension: Secondary | ICD-10-CM | POA: Insufficient documentation

## 2014-10-17 DIAGNOSIS — Z7901 Long term (current) use of anticoagulants: Secondary | ICD-10-CM | POA: Diagnosis not present

## 2014-10-17 DIAGNOSIS — I5042 Chronic combined systolic (congestive) and diastolic (congestive) heart failure: Secondary | ICD-10-CM | POA: Insufficient documentation

## 2014-10-17 DIAGNOSIS — E785 Hyperlipidemia, unspecified: Secondary | ICD-10-CM | POA: Insufficient documentation

## 2014-10-17 DIAGNOSIS — I4729 Other ventricular tachycardia: Secondary | ICD-10-CM

## 2014-10-17 MED ORDER — MAGNESIUM OXIDE -MG SUPPLEMENT 200 MG PO TABS: 1.0000 | ORAL_TABLET | Freq: Three times a day (TID) | ORAL | Status: DC

## 2014-10-17 NOTE — Progress Notes (Signed)
Patient ID: Sara Walker, female   DOB: 11/25/1950, 64 y.o.   MRN: [id number]      Reason for office visit HF follow up.   Sara Walker is a 64 y/o woman with morbid obesity, DM, breast CA, VT and systolic HF due to severe nonischemic cardiomyopathy (EF 20-25% echo 12/15) related to previous anthracycline chemotherapy and showed substantial improvement after cardiac resynchronization therapy from a clinical standpoint, although LVEF remains severely depressed. She has had asymptomatic device detected paroxysmal atrial fibrillation. She does not have a history of stroke TIA or other embolic events and is on chronic anticoagulation with Xarelto. Referred by Dr. Recardo Evangelist to HF Clinic for evaluation of advanced therapies.   She had 2 ICD shocks in the early morning of February 4, for 2 separate episodes of ventricular tachyarrhythmia that occurred within an hour of each other. Remote download of her device showed appropriate discharge for polymorphic ventricular tachycardia. She was unaware of the shocks, but remembers waking up around 3 AM with brief nausea. The next day she was completely asymptomatic. Recently she had had major adjustments to her insulin regimen. Laboratory tests were significant for moderate hypokalemia with a potassium of 3.1, normal magnesium level and slightly elevated creatinine of 1.2. Metolazone was discontinued and additional potassium supplements were given as well as increasing the dose of spironolactone. Lab tests showed improvement in potassium level and renal function.  She returns for follow up. Last visit CPX set up. Last visit she was started on losartan 12.5 mg daily. Does not want to up titrate any meds today. Remains sob with exertion. Leans on the grocery cart. Weight at home 250-258 pounds. Took metolazone this week. Drinking lots of water. Able to care for grandchildren 3 days a week. Having irregular vaginal bleeding but she has missed gyn appointments. Taking all  medications. 10 years ago diagnosed with OSA but never used CPAP.   Labs 08/19/14  K 3.5 Creatinine 0.96 09/18/2014: K 3.4  4/48/2016:  K 4.1 Creatinine 1.07 Mag 1.6   Allergies  Allergen Reactions  . Lasix [Furosemide]     Causes gout  . Ace Inhibitors Cough  . Codeine Other (See Comments)    Crazy  . Digitalis Other (See Comments)    "saw yellow circles"  . Levaquin [Levofloxacin In D5w]     Gout   . Other Other (See Comments)    Nitro spray: reaction unknown  . Statins Other (See Comments)    Reaction unknown    Current Outpatient Prescriptions  Medication Sig Dispense Refill  . allopurinol (ZYLOPRIM) 300 MG tablet Take 450 mg once a day    . carvedilol (COREG CR) 80 MG 24 hr capsule Take 1 capsule (80 mg total) by mouth daily. 90 capsule 3  . colchicine 0.6 MG tablet Take 0.6 mg by mouth daily as needed (for pain).     . Insulin Lispro, Human, (HUMALOG [place]) Inject 100-135 Units into the skin daily.    Marland Kitchen losartan (COZAAR) 25 MG tablet Take 0.5 tablets (12.5 mg total) by mouth daily. 15 tablet 3  . Magnesium Oxide 200 MG TABS Take 1 tablet (200 mg total) by mouth 2 (two) times daily. 60 tablet 3  . metolazone (ZAROXOLYN) 5 MG tablet Take 1 tablet (5 mg total) by mouth daily as needed (take if weight is over 255 lbs.). 90 tablet 3  . potassium chloride SA (K-DUR,KLOR-CON) 20 MEQ tablet Take one tab daily.  Take an extra 29meq when you take Metolazone. [place]  tablet 5  . rivaroxaban (XARELTO) 20 MG TABS tablet Take 1 tablet (20 mg total) by mouth daily with supper. 15 tablet 0  . spironolactone (ALDACTONE) 50 MG tablet Take 1 tablet (50 mg total) by mouth daily. 90 tablet 2  . torsemide (DEMADEX) 100 MG tablet TAKE ONE TABLET BY MOUTH ONCE DAILY 90 tablet 3  . vitamin B-12 (CYANOCOBALAMIN) 500 MCG tablet Take 500 mcg by mouth daily.     No current facility-administered medications for this encounter.    Past Medical History  Diagnosis Date  . Goiter   . DM2 (diabetes mellitus,  type 2)   . Chronic systolic heart failure   . Invasive ductal carcinoma of breast   . Gout   . Hyperlipidemia   . Hypertension   . Breast cancer   . LBBB (left bundle branch block)     evidnece of RBBB fall 2012  . Cardiomyopathy secondary to chemotherapy     a.  doxurubicin;  b. s/p SJM Quadra Assura CRT-D, model [id number] Q, Ser# [id number]  . Syncope   . PONV (postoperative nausea and vomiting)     nausea post ICD placement relieved with Zofran  . Coronary artery disease   . Angina   . CHF (congestive heart failure)   . Shortness of breath   . Nonischemic cardiomyopathy     anthracycline-related  . Automatic implantable cardioverter-defibrillator in situ   . Pacemaker     Past Surgical History  Procedure Laterality Date  . Mastectomy      right  . Tunneled venous catheter placement      removed  . Breast surgery      Left mastectomy  . Cardiac defibrillator placement      St.Jude Quadra  . Cesarean section  [place]  . Dilation and curettage of uterus  1975  . Portacath placement  1991  . Foot surgery  1994  . Nm myocar perf wall motion  12/01/2011    no ischemia; EF 22%  . Bi-ventricular implantable cardioverter defibrillator N/A 12/12/2011    Procedure: BI-VENTRICULAR IMPLANTABLE CARDIOVERTER DEFIBRILLATOR  (CRT-D);  Surgeon: Deboraha Sprang, MD;  Location[facility] LAB;  Service: Cardiovascular;  Laterality: N/A;    Family History  Problem Relation Age of Onset  . Heart attack Father   . Hypertension Brother   . Diabetes Brother     History   Social History  . Marital Status: Divorced    Spouse Name: N/A  . Number of Children: N/A  . Years of Education: N/A   Occupational History  . Not on file.   Social History Main Topics  . Smoking status: Former Research scientist (life sciences)  . Smokeless tobacco: Never Used  . Alcohol Use: No  . Drug Use: No  . Sexual Activity: Not Currently   Other Topics Concern  . Not on file   Social History Narrative   PHYSICAL EXAM BP  116/68 mmHg  Pulse 74  Wt 256 lb 4 oz (116.234 kg)  SpO2 97% Severely obese  General: Alert, oriented x3, no distress  Head: no evidence of trauma, PERRL, EOMI, no exophtalmos or lid lag, no myxedema, no xanthelasma; normal ears, nose and oropharynx Neck: 7-8 centimeters elevation in jugular venous pulsations and prompt hepatojugular reflux; brisk carotid pulses without delay and no carotid bruits  Chest: clear to auscultation  Cardiovascular: RR no m/r/g  S/p R mastectomy Abdomen: obese. no tenderness or distention, no masses by palpation, no abnormal pulsatility or arterial bruits, normal  bowel sounds, no hepatosplenomegaly  Extremities: No lower extremity edema.   Neurological: grossly nonfocal    Lipid Panel  No results found for: CHOL, TRIG, HDL, CHOLHDL, VLDL, LDLCALC, LDLDIRECT  BMET    Component Value Date/Time   NA 135 10/13/2014 1007   NA 127* 08/29/2013 0720   K 4.1 10/13/2014 1007   CL 92* 10/13/2014 1007   CO2 30* 10/13/2014 1007   GLUCOSE 257* 10/13/2014 1007   GLUCOSE 165* 08/29/2013 0720   BUN 22 10/13/2014 1007   BUN 53* 08/29/2013 0720   CREATININE 1.07* 10/13/2014 1007   CREATININE 1.04 08/20/2013 1318   CALCIUM 9.7 10/13/2014 1007   GFRNONAA 55* 10/13/2014 1007   GFRAA 64 10/13/2014 1007     ASSESSMENT AND PLAN  1. Chronic  systolic and diastolic heart failure secondary to nonischemic (anthracycline related) cardiomyopathy (1991). Has ST Jude CRT-D  NYHA III- Volume status stable. Continue torsemide 100 mg daily and metolazone as needed for weight greater than 255 pounds  -Continue carvedilol cr 80 mg daily  - Not on dig due to side effects -Not on ace due to cough. Continue losartan 12.5 mg daily she declines up titration.  Continue 25 spironolactone daily CPX set up for 10/23/2014.  2. ICD discharge for recurrent polymorphic VT in setting of hypokalemia Most recent  Mag 1.6 on 10/13/2014. Increase 200 mg three times a day.   3. Non-valvular  paroxysmal atrial fibrillation   Continue full anticoagulation. Chadsvasc at least 3. Remains on Xarelto.  4. Biventricular ICD (implantable cardioverter-defibrillator) in place. Continue Merlin Q3 month monitoring. LV lead is pacing M3-RV coil.  Follow up in 4 weeks to discuss CPX results.    Cloee Dunwoody NP-C  11:50 AM

## 2014-10-17 NOTE — Patient Instructions (Signed)
Increase Mag-ox to 200 mg Three times a day   Your physician recommends that you schedule a follow-up appointment in: 4-6 weeks

## 2014-10-20 ENCOUNTER — Telehealth: Payer: Self-pay | Admitting: Cardiovascular Disease

## 2014-10-20 ENCOUNTER — Other Ambulatory Visit: Payer: Self-pay | Admitting: Cardiovascular Disease

## 2014-10-20 ENCOUNTER — Encounter: Payer: Self-pay | Admitting: Cardiovascular Disease

## 2014-10-22 NOTE — Telephone Encounter (Signed)
Closed encounter °

## 2014-10-23 ENCOUNTER — Ambulatory Visit (HOSPITAL_COMMUNITY): Payer: Medicare Other | Attending: Internal Medicine

## 2014-10-23 DIAGNOSIS — I5043 Acute on chronic combined systolic (congestive) and diastolic (congestive) heart failure: Secondary | ICD-10-CM | POA: Insufficient documentation

## 2014-10-24 DIAGNOSIS — I5043 Acute on chronic combined systolic (congestive) and diastolic (congestive) heart failure: Secondary | ICD-10-CM | POA: Diagnosis not present

## 2014-10-28 ENCOUNTER — Encounter: Payer: Self-pay | Admitting: Cardiovascular Disease

## 2014-10-28 ENCOUNTER — Other Ambulatory Visit (HOSPITAL_COMMUNITY): Payer: Self-pay | Admitting: *Deleted

## 2014-10-28 ENCOUNTER — Telehealth (HOSPITAL_COMMUNITY): Payer: Self-pay | Admitting: *Deleted

## 2014-10-28 MED ORDER — MAGNESIUM OXIDE 400 MG PO TABS: 200.0000 mg | ORAL_TABLET | Freq: Three times a day (TID) | ORAL | Status: DC

## 2014-10-28 NOTE — Telephone Encounter (Signed)
Pt said Magnesium oxide 200 mg is on back order at her pharmacy.  Pt is supposed to take mag 200 mg tid.  Pharmacy told pt to have [place] send a 400 mg tablet and she could take half a pill 3 times a day. Pt also requested a 90 day supply.  I sent her prescription for mag oxide 400 mg tablet (take 200 mg tid) with a 90 day supply

## 2014-10-31 ENCOUNTER — Ambulatory Visit (INDEPENDENT_AMBULATORY_CARE_PROVIDER_SITE_OTHER): Payer: Medicare Other | Admitting: Cardiovascular Disease

## 2014-10-31 ENCOUNTER — Encounter: Payer: Self-pay | Admitting: Cardiovascular Disease

## 2014-10-31 VITALS — BP 122/70 | HR 79 | Ht 68.0 in | Wt 258.2 lb

## 2014-10-31 DIAGNOSIS — I4729 Other ventricular tachycardia: Secondary | ICD-10-CM

## 2014-10-31 DIAGNOSIS — I472 Ventricular tachycardia, unspecified: Secondary | ICD-10-CM

## 2014-10-31 DIAGNOSIS — I5043 Acute on chronic combined systolic (congestive) and diastolic (congestive) heart failure: Secondary | ICD-10-CM

## 2014-10-31 DIAGNOSIS — E785 Hyperlipidemia, unspecified: Secondary | ICD-10-CM

## 2014-10-31 DIAGNOSIS — I480 Paroxysmal atrial fibrillation: Secondary | ICD-10-CM | POA: Diagnosis not present

## 2014-10-31 DIAGNOSIS — I5022 Chronic systolic (congestive) heart failure: Secondary | ICD-10-CM

## 2014-10-31 DIAGNOSIS — Z95810 Presence of automatic (implantable) cardiac defibrillator: Secondary | ICD-10-CM

## 2014-10-31 DIAGNOSIS — I447 Left bundle-branch block, unspecified: Secondary | ICD-10-CM

## 2014-10-31 DIAGNOSIS — I10 Essential (primary) hypertension: Secondary | ICD-10-CM

## 2014-10-31 DIAGNOSIS — I427 Cardiomyopathy due to drug and external agent: Secondary | ICD-10-CM

## 2014-10-31 DIAGNOSIS — T451X5A Adverse effect of antineoplastic and immunosuppressive drugs, initial encounter: Secondary | ICD-10-CM

## 2014-10-31 DIAGNOSIS — R079 Chest pain, unspecified: Secondary | ICD-10-CM | POA: Diagnosis not present

## 2014-10-31 NOTE — Patient Instructions (Signed)
Your physician has recommended you make the following change in your medication: STOP the losartan.   Your physician recommends that you schedule a follow-up appointment in: 3 months device clinic day.

## 2014-10-31 NOTE — Progress Notes (Signed)
Patient ID: Sara Walker, female   DOB: 08/12/1950, 64 y.o.   MRN: [id number]     Cardiology Office Note   Date:  10/31/2014   ID:  Sara Walker, DOB August 21, 1950, MRN [id number]  PCP:  Horatio Pel, MD  Cardiologist:   Sanda Klein, MD   Chief Complaint  Patient presents with  . ROV    PATIENT REPORTS CHEST PAIN.       History of Present Illness: Sara Walker is a 64 y.o. female who presents for follow-up of severe nonischemic cardiomyopathy and advanced (stage D) combined systolic and diastolic heart failure.  Since her last appointment she has not had new defibrillator discharges. Her device check today shows no episodes of nonsustained VT or paroxysmal atrial fibrillation. Most recent labs show her potassium is in normal range but her magnesium continues to be at lower limit of normal. Her magnesium supplement dose was recently increased.  Her cardiopulmonary stress test was not particularly helpful since it was interrupted before she reached true maximum exercise capacity. She does not appear to have any ventilatory limitation. Her max VO2 was reported at 10 mL/kilogram/minute, but again the exercise was submaximal. She only reached 53% of the age-predicted maximum heart rate. The peak R ER was 0.97. She had a blunted heart rate and blood pressure response, suggesting chronotropic incompetence. However, review of her defibrillator heart rate histogram shows a fairly normal heart rate distribution for a sedentary person.  She continues to have class III symptoms of heart failure. She has 2 due her dishwashing, and shifts". Walking inside the house from one room to another requires periods of rest. He denies syncope or palpitations and has had at most minimal ankle edema. Her weight is slightly above her estimated dry weight today. Interrogation of her ICD shows that her thoracic impedance is drifting down again.  She was started on losartan in a tiny dose and after few days  began to cough again. She feels that it makes her throat tight. She has had similar side effects with other RAAS inhibitors in the past.  She started having monthly vaginal bleeding. She was evaluated by her gynecologist who told her she has menstrual bleeding and that there is no other abnormality causing this. She is taking Xarelto for paroxysmal atrial fibrillation. She does not have a history of stroke or other embolic events.  Diabetes control is dramatically improved with her hemoglobin A1c now close to 7%. She has had a few episodes of hypoglycemia. She is using an insulin pump. She has a "physical" with Dr. Deland Pretty coming up in May.    Past Medical History  Diagnosis Date  . Goiter   . DM2 (diabetes mellitus, type 2)   . Chronic systolic heart failure   . Invasive ductal carcinoma of breast   . Gout   . Hyperlipidemia   . Hypertension   . Breast cancer   . LBBB (left bundle branch block)     evidnece of RBBB fall 2012  . Cardiomyopathy secondary to chemotherapy     a.  doxurubicin;  b. s/p SJM Quadra Assura CRT-D, model [id number] Q, Ser# [id number]  . Syncope   . PONV (postoperative nausea and vomiting)     nausea post ICD placement relieved with Zofran  . Coronary artery disease   . Angina   . CHF (congestive heart failure)   . Shortness of breath   . Nonischemic cardiomyopathy     anthracycline-related  . Automatic implantable cardioverter-defibrillator in  situ   . Pacemaker     Past Surgical History  Procedure Laterality Date  . Mastectomy      right  . Tunneled venous catheter placement      removed  . Breast surgery      Left mastectomy  . Cardiac defibrillator placement      St.Jude Quadra  . Cesarean section  [place]  . Dilation and curettage of uterus  1975  . Portacath placement  1991  . Foot surgery  1994  . Nm myocar perf wall motion  12/01/2011    no ischemia; EF 22%  . Bi-ventricular implantable cardioverter defibrillator N/A 12/12/2011     Procedure: BI-VENTRICULAR IMPLANTABLE CARDIOVERTER DEFIBRILLATOR  (CRT-D);  Surgeon: Deboraha Sprang, MD;  Location: [facility] CATH LAB;  Service: Cardiovascular;  Laterality: N/A;     Current Outpatient Prescriptions  Medication Sig Dispense Refill  . allopurinol (ZYLOPRIM) 300 MG tablet Take 450 mg once a day    . carvedilol (COREG CR) 80 MG 24 hr capsule Take 1 capsule (80 mg total) by mouth daily. 90 capsule 3  . colchicine 0.6 MG tablet Take 0.6 mg by mouth daily as needed (for pain).     . Insulin Lispro, Human, (HUMALOG [place]) Inject 100-135 Units into the skin daily.    . magnesium oxide (MAG-OX) 400 MG tablet Take 0.5 tablets (200 mg total) by mouth 3 (three) times daily. 135 tablet 3  . metolazone (ZAROXOLYN) 5 MG tablet Take 1 tablet (5 mg total) by mouth daily as needed (take if weight is over 255 lbs.). 90 tablet 3  . potassium chloride SA (K-DUR,KLOR-CON) 20 MEQ tablet Take one tab daily.  Take an extra 67meq when you take Metolazone. 60 tablet 5  . rivaroxaban (XARELTO) 20 MG TABS tablet Take 1 tablet (20 mg total) by mouth daily with supper. 15 tablet 0  . spironolactone (ALDACTONE) 50 MG tablet Take 1 tablet (50 mg total) by mouth daily. 90 tablet 2  . torsemide (DEMADEX) 100 MG tablet TAKE ONE TABLET BY MOUTH ONCE DAILY 90 tablet 3  . vitamin B-12 (CYANOCOBALAMIN) 500 MCG tablet Take 500 mcg by mouth daily.     No current facility-administered medications for this visit.    Allergies:   Lasix; Ace inhibitors; Codeine; Digitalis; Levaquin; Other; and Statins    Social History:  The patient  reports that she has quit smoking. She has never used smokeless tobacco. She reports that she does not drink alcohol or use illicit drugs.   Family History:  The patient's family history includes Diabetes in her brother; Heart attack in her father; Hypertension in her brother.    ROS:  Please see the history of present illness.    Otherwise, review of systems positive for none.   All other  systems are reviewed and negative.    PHYSICAL EXAM: VS:  BP 122/70 mmHg  Pulse 79  Ht 5\' 8"  (1.727 m)  Wt 258 lb 3.2 oz (117.119 kg)  BMI 39.27 kg/m2 , BMI Body mass index is 39.27 kg/(m^2).  General: Alert, oriented x3, no distress Head: no evidence of trauma, PERRL, EOMI, no exophtalmos or lid lag, no myxedema, no xanthelasma; normal ears, nose and oropharynx Neck: normal jugular venous pulsations and no hepatojugular reflux; brisk carotid pulses without delay and no carotid bruits Chest: clear to auscultation, no signs of consolidation by percussion or palpation, normal fremitus, symmetrical and full respiratory excursions Cardiovascular: normal position and quality of the apical impulse,  regular rhythm, normal first and second heart sounds, no murmurs, rubs or gallops Abdomen: no tenderness or distention, no masses by palpation, no abnormal pulsatility or arterial bruits, normal bowel sounds, no hepatosplenomegaly Extremities: no clubbing, cyanosis or edema; 2+ radial, ulnar and brachial pulses bilaterally; 2+ right femoral, posterior tibial and dorsalis pedis pulses; 2+ left femoral, posterior tibial and dorsalis pedis pulses; no subclavian or femoral bruits Neurological: grossly nonfocal Psych: euthymic mood, full affect   EKG:  EKG is ordered today. The ekg ordered today demonstrates atrial sensed, biventricular paced   Recent Labs: 10/13/2014: BUN 22; Creatinine 1.07*; Magnesium 1.6; Potassium 4.1; Sodium 135    Lipid Panel No results found for: CHOL, TRIG, HDL, CHOLHDL, VLDL, LDLCALC, LDLDIRECT    Wt Readings from Last 3 Encounters:  10/31/14 258 lb 3.2 oz (117.119 kg)  10/17/14 256 lb 4 oz (116.234 kg)  10/02/14 248 lb 8 oz (112.719 kg)      ASSESSMENT AND PLAN:  1.  Congestive heart failure stage D, NYHA class III, slightly hypervolemic by exam and thoracic impedance recordings. She is recommended to take metolazone on days when her weight exceeds 255 pounds (that  would include today). Due to her previous problems with hypokalemia and hypomagnesemia I have asked her to call if she ever needs to take the metolazone more than twice weekly. May need left ventricular assist device in the future  2. Poor exercise tolerance is multifactorial and related to congestive heart failure and severe obesity, I'm not certain about the diagnosis of chronotropic incompetence whether it be beta blocker-related or not. Consideration could be given to a more aggressive sensor setting on her defibrillator, but I'm not sure that that would help. I would not reduce the dose of beta blocker since it is beneficial for her congestive heart failure and ventricular and atrial arrhythmia. Plan to discuss with Dr. Haroldine Laws.  3. Normally functioning CRT-D - device check shows virtually 100% by the pacing, no recent atrial or ventricular tachyarrhythmia. The left ventricular capture threshold is relatively high but seems to be covered by the current device settings. Auto capture was turned on. LV lead is pacing M3-RV coil.  4. Paroxysmal ventricular tachycardia - this was precipitated by major electrolyte abnormalities  5. Paroxysmal atrial fibrillation continue anticoagulant therapy. The burden of trial arrhythmia is very low  Current medicines are reviewed at length with the patient today.  The patient does have concerns regarding medicines: She is confident that losartan is causing her cough.  The following changes have been made:  Stop losartan  Labs/ tests ordered today include:  Orders Placed This Encounter  Procedures  . EKG 12-Lead    Patient Instructions  Your physician has recommended you make the following change in your medication: STOP the losartan.   Your physician recommends that you schedule a follow-up appointment in: 3 months device clinic day.    Mikael Spray, MD  10/31/2014 10:44 AM    Sanda Klein, MD, [facility] [phone]  office [phone] pager

## 2014-11-14 ENCOUNTER — Ambulatory Visit (HOSPITAL_COMMUNITY)
Admission: RE | Admit: 2014-11-14 | Discharge: 2014-11-14 | Disposition: A | Discharge status: Home / Self Care | Payer: Medicare Other | Source: Ambulatory Visit | Attending: Cardiology | Admitting: Cardiology

## 2014-11-14 ENCOUNTER — Encounter (HOSPITAL_COMMUNITY): Payer: Self-pay

## 2014-11-14 VITALS — BP 118/72 | HR 80 | Wt 249.8 lb

## 2014-11-14 DIAGNOSIS — I4729 Other ventricular tachycardia: Secondary | ICD-10-CM

## 2014-11-14 DIAGNOSIS — I10 Essential (primary) hypertension: Secondary | ICD-10-CM | POA: Insufficient documentation

## 2014-11-14 DIAGNOSIS — E119 Type 2 diabetes mellitus without complications: Secondary | ICD-10-CM | POA: Insufficient documentation

## 2014-11-14 DIAGNOSIS — I429 Cardiomyopathy, unspecified: Secondary | ICD-10-CM | POA: Insufficient documentation

## 2014-11-14 DIAGNOSIS — Z794 Long term (current) use of insulin: Secondary | ICD-10-CM | POA: Diagnosis not present

## 2014-11-14 DIAGNOSIS — I472 Ventricular tachycardia: Secondary | ICD-10-CM | POA: Diagnosis not present

## 2014-11-14 DIAGNOSIS — E785 Hyperlipidemia, unspecified: Secondary | ICD-10-CM | POA: Insufficient documentation

## 2014-11-14 DIAGNOSIS — Z87891 Personal history of nicotine dependence: Secondary | ICD-10-CM | POA: Insufficient documentation

## 2014-11-14 DIAGNOSIS — M109 Gout, unspecified: Secondary | ICD-10-CM | POA: Diagnosis not present

## 2014-11-14 DIAGNOSIS — I480 Paroxysmal atrial fibrillation: Secondary | ICD-10-CM | POA: Insufficient documentation

## 2014-11-14 DIAGNOSIS — I5022 Chronic systolic (congestive) heart failure: Secondary | ICD-10-CM | POA: Diagnosis not present

## 2014-11-14 DIAGNOSIS — Z79899 Other long term (current) drug therapy: Secondary | ICD-10-CM | POA: Insufficient documentation

## 2014-11-14 DIAGNOSIS — Z7901 Long term (current) use of anticoagulants: Secondary | ICD-10-CM | POA: Insufficient documentation

## 2014-11-14 DIAGNOSIS — Z950 Presence of cardiac pacemaker: Secondary | ICD-10-CM | POA: Diagnosis not present

## 2014-11-14 MED ORDER — SACUBITRIL-VALSARTAN 24-26 MG PO TABS: 1.0000 | ORAL_TABLET | Freq: Two times a day (BID) | ORAL | Status: DC

## 2014-11-14 NOTE — Patient Instructions (Signed)
START Entresto 24/26 - 1 tablet twice daily.  Have labs drawn 10 days from today (Monday 11/24/14).  Follow up 1 month.  Do the following things EVERYDAY: 1) Weigh yourself in the morning before breakfast. Write it down and keep it in a log. 2) Take your medicines as prescribed 3) Eat low salt foods-Limit salt (sodium) to 2000 mg per day.  4) Stay as active as you can everyday 5) Limit all fluids for the day to less than 2 liters

## 2014-11-15 NOTE — Progress Notes (Signed)
Patient ID: Sara Walker, female   DOB: Sep 17, 1950, 64 y.o.   MRN: [id number]    PCP: Dr. Shelia Media  Primary Cardiology: Dr. Cherlynn Kaiser is a 64 y/o woman with morbid obesity, DM, breast CA, VT and systolic HF due to severe nonischemic cardiomyopathy (EF 20-25% echo 12/15) related to previous anthracycline chemotherapy and showed substantial improvement after cardiac resynchronization therapy from a clinical standpoint (St Jude CRT-D), although LVEF remains severely depressed. She has had asymptomatic device detected paroxysmal atrial fibrillation. She does not have a history of stroke TIA or other embolic events and is on chronic anticoagulation with Xarelto. Referred by Dr. Sallyanne Kuster to HF Clinic for evaluation of advanced therapies.   She had 2 ICD shocks in the early morning of August 14, 2014 for 2 separate episodes of ventricular tachyarrhythmia that occurred within an hour of each other. Remote download of her device showed appropriate discharge for polymorphic ventricular tachycardia. She was unaware of the shocks, but remembers waking up around 3 AM with brief nausea. The next day she was completely asymptomatic. Recently she had had major adjustments to her insulin regimen. Laboratory tests were significant for moderate hypokalemia with a potassium of 3.1, normal magnesium level and slightly elevated creatinine of 1.2. Metolazone was discontinued and additional potassium supplements were given as well as increasing the dose of spironolactone. Lab tests showed improvement in potassium level and renal function.  She returns for follow up. Prior to this appointment, she had a CPX done in 4/16.  She had a submaximal effort, but based on available data, peak VO2 seemed to be mildly reduced compared to age-matched sedentary subjects.  The major limitation appeared to be obesity with resulting restrictive lung physiology.  She actually seems to be doing reasonably well today.  She is short of breath  with relatively long walks or walking up stairs. She is able to get around the grocery store with a cart to lean on. She takes care of her grandchildren several times a week.  She is able to walk to her mailbox and take out the garbage without much trouble.  Some dyspnea with vacuuming.  She was started on losartan at last appointment but stopped it due to cough/dry throat.  Weight is down 7 lbs since last appointment.  Last use of metolazone was about a week ago.  She has paroxysmal atrial fibrillation but is in NSR today.   Corevue checked today, thoracic impedance trending up.   Labs 08/19/14  K 3.5 Creatinine 0.96 09/18/2014: K 3.4  4/48/2016:  K 4.1 Creatinine 1.07 Mag 1.6   CPX (4/16): peak VO2 10.6, slope 31.1, RER 0.97 => submaximal effort, mildly reduced peak VO2 based on submaximal data (compared to sedentary normals), major restriction to functional capacity appears to be obesity and restrictive lung physiology.    Allergies  Allergen Reactions  . Lasix [Furosemide]     Causes gout  . Ace Inhibitors Cough  . Codeine Other (See Comments)    Crazy  . Digitalis Other (See Comments)    "saw yellow circles"  . Levaquin [Levofloxacin In D5w]     Gout   . Other Other (See Comments)    Nitro spray: reaction unknown  . Statins Other (See Comments)    Reaction unknown    Current Outpatient Prescriptions  Medication Sig Dispense Refill  . allopurinol (ZYLOPRIM) 300 MG tablet Take 450 mg once a day    . carvedilol (COREG CR) 80 MG 24 hr capsule Take 1  capsule (80 mg total) by mouth daily. 90 capsule 3  . colchicine 0.6 MG tablet Take 0.6 mg by mouth daily as needed (for pain).     . Insulin Lispro, Human, (HUMALOG [place]) Inject 100-135 Units into the skin daily.    . magnesium oxide (MAG-OX) 400 MG tablet Take 0.5 tablets (200 mg total) by mouth 3 (three) times daily. 135 tablet 3  . metolazone (ZAROXOLYN) 5 MG tablet Take 1 tablet (5 mg total) by mouth daily as needed (take if weight is  over 255 lbs.). 90 tablet 3  . potassium chloride SA (K-DUR,KLOR-CON) 20 MEQ tablet Take one tab daily.  Take an extra 35meq when you take Metolazone. 60 tablet 5  . rivaroxaban (XARELTO) 20 MG TABS tablet Take 1 tablet (20 mg total) by mouth daily with supper. 15 tablet 0  . spironolactone (ALDACTONE) 50 MG tablet Take 1 tablet (50 mg total) by mouth daily. 90 tablet 2  . torsemide (DEMADEX) 100 MG tablet TAKE ONE TABLET BY MOUTH ONCE DAILY 90 tablet 3  . vitamin B-12 (CYANOCOBALAMIN) 500 MCG tablet Take 500 mcg by mouth daily.    . sacubitril-valsartan (ENTRESTO) 24-26 MG Take 1 tablet by mouth 2 (two) times daily. 60 tablet 3   No current facility-administered medications for this encounter.    Past Medical History  Diagnosis Date  . Goiter   . DM2 (diabetes mellitus, type 2)   . Chronic systolic heart failure   . Invasive ductal carcinoma of breast   . Gout   . Hyperlipidemia   . Hypertension   . Breast cancer   . LBBB (left bundle branch block)     evidnece of RBBB fall 2012  . Cardiomyopathy secondary to chemotherapy     a.  doxurubicin;  b. s/p SJM Quadra Assura CRT-D, model [id number] Q, Ser# [id number]  . Syncope   . PONV (postoperative nausea and vomiting)     nausea post ICD placement relieved with Zofran  . Coronary artery disease   . Angina   . CHF (congestive heart failure)   . Shortness of breath   . Nonischemic cardiomyopathy     anthracycline-related  . Automatic implantable cardioverter-defibrillator in situ   . Pacemaker     Past Surgical History  Procedure Laterality Date  . Mastectomy      right  . Tunneled venous catheter placement      removed  . Breast surgery      Left mastectomy  . Cardiac defibrillator placement      St.Jude Quadra  . Cesarean section  [place]  . Dilation and curettage of uterus  1975  . Portacath placement  1991  . Foot surgery  1994  . Nm myocar perf wall motion  12/01/2011    no ischemia; EF 22%  . Bi-ventricular  implantable cardioverter defibrillator N/A 12/12/2011    Procedure: BI-VENTRICULAR IMPLANTABLE CARDIOVERTER DEFIBRILLATOR  (CRT-D);  Surgeon: Deboraha Sprang, MD;  Location: [facility] CATH LAB;  Service: Cardiovascular;  Laterality: N/A;    Family History  Problem Relation Age of Onset  . Heart attack Father   . Hypertension Brother   . Diabetes Brother     History   Social History  . Marital Status: Divorced    Spouse Name: N/A  . Number of Children: N/A  . Years of Education: N/A   Occupational History  . Not on file.   Social History Main Topics  . Smoking status: Former Research scientist (life sciences)  . Smokeless tobacco:  Never Used  . Alcohol Use: No  . Drug Use: No  . Sexual Activity: Not Currently   Other Topics Concern  . Not on file   Social History Narrative   PHYSICAL EXAM BP 118/72 mmHg  Pulse 80  Wt 249 lb 12 oz (113.286 kg)  SpO2 97% General: Alert, oriented x3, no distress.  Obese.  Head: no evidence of trauma, PERRL, EOMI, no exophtalmos or lid lag, no myxedema, no xanthelasma; normal ears, nose and oropharynx Neck: Thick, JVP 7 cm; brisk carotid pulses without delay and no carotid bruits  Chest: clear to auscultation  Cardiovascular: RRR no m/r/g  S/p R mastectomy Abdomen: obese. no tenderness or distention, no masses by palpation, no abnormal pulsatility or arterial bruits, normal bowel sounds, no hepatosplenomegaly  Extremities: No lower extremity edema.   Neurological: grossly nonfocal   BMET    Component Value Date/Time   NA 135 10/13/2014 1007   NA 127* 08/29/2013 0720   K 4.1 10/13/2014 1007   CL 92* 10/13/2014 1007   CO2 30* 10/13/2014 1007   GLUCOSE 257* 10/13/2014 1007   GLUCOSE 165* 08/29/2013 0720   BUN 22 10/13/2014 1007   BUN 53* 08/29/2013 0720   CREATININE 1.07* 10/13/2014 1007   CREATININE 1.04 08/20/2013 1318   CALCIUM 9.7 10/13/2014 1007   GFRNONAA 55* 10/13/2014 1007   GFRAA 64 10/13/2014 1007     ASSESSMENT AND PLAN  1. Chronic systolic  heart failure secondary to nonischemic (anthracycline related) cardiomyopathy (1991): Has St Jude CRT-D.  Echo (12/15) with EF 20-25%.  NYHA class II-III symptoms.  Volume looks ok today on exam and by Corevue.  Weight is down 7 lbs.  CPX (4/16) was submaximal but suggested only a mild circulatory defect compared to age-matched sedentary normals.  Main limitation appeared to be due to restrictive respiratory physiology likely from obesity. Overall, she seems stable today.  - Continue torsemide 100 mg daily.  Check BNP.  - Continue Carvedilol CR 80 mg daily  - Not on dig due to side effects - She has not been able to tolerate ACEI or ARB.  - Continue 25 spironolactone daily - I will try her on Entresto 24/26 bid to see if she can tolerate this.   2. Polymorphic VT: Has ICD.  Keep Mg and K replete.  BMET/Mg today.  3. Paroxysmal atrial fibrillation: NSR today.  Continue Xarelto.  4. Obesity: Encouraged weight loss.   Loralie Champagne  11/15/2014

## 2014-11-20 ENCOUNTER — Telehealth (HOSPITAL_COMMUNITY): Payer: Self-pay | Admitting: *Deleted

## 2014-11-20 NOTE — Telephone Encounter (Signed)
Completed PA for Entresto 24/26 mg, med was approved through 11/20/15, [facility] aware

## 2014-11-21 IMAGING — DX DG CHEST 1V PORT
1 series · 1 of 1 positions shown · non-contrast
Comparison: 08/20/2013

CLINICAL DATA: AICD problem.

EXAM:
PORTABLE CHEST - 1 VIEW

[ap portable]
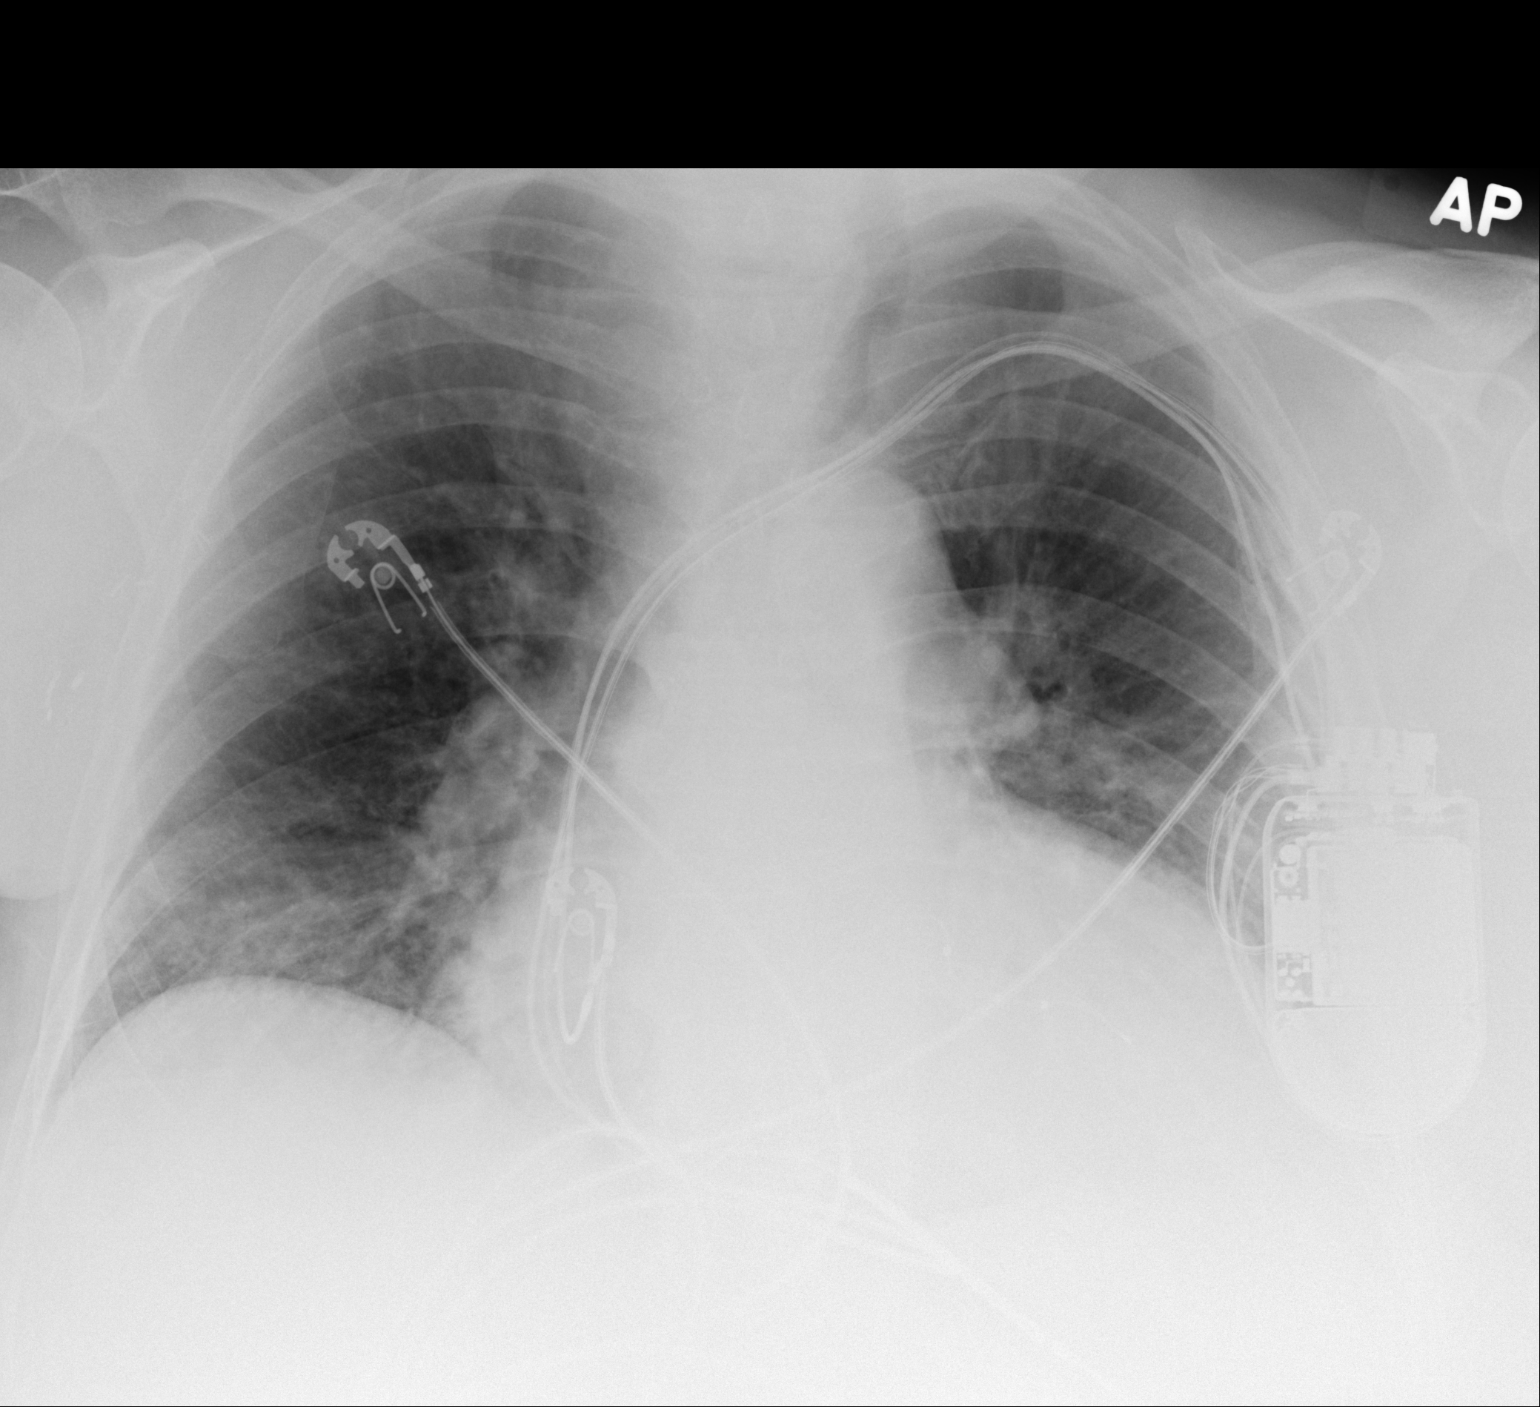

[1 of 1 positions shown; findings below may reference images not displayed]

FINDINGS: Underpenetration somewhat limits evaluation of the a left approach
biventricular AICD/ pacer. The lead orientation is stable from
prior. No evidence of lead discontinuity. Battery pack has a similar
orientation.

Chronic cardiopericardial enlargement. Interstitial prominence at
the bases likely from hypoaeration and portable technique. There is
mild venous congestion. No consolidation, effusion, or pneumothorax.
Large mass at the thoracic inlet, displacing the trachea to the
left. The finding is present since at least 3500, and unchanged from
1409. There is history goiter in the electronic medical record.
IMPRESSION: 1. Unchanged appearance of left approach biventricular ICD/ pacer.
2. Venous congestion without pulmonary edema.
3. Chronic mass at the right thoracic inlet, likely thyroidal in
this patient with history of goiter.

## 2014-11-24 DIAGNOSIS — M1A09X0 Idiopathic chronic gout, multiple sites, without tophus (tophi): Secondary | ICD-10-CM | POA: Diagnosis not present

## 2014-11-24 DIAGNOSIS — I10 Essential (primary) hypertension: Secondary | ICD-10-CM | POA: Diagnosis not present

## 2014-11-24 DIAGNOSIS — E1165 Type 2 diabetes mellitus with hyperglycemia: Secondary | ICD-10-CM | POA: Diagnosis not present

## 2014-11-24 DIAGNOSIS — E785 Hyperlipidemia, unspecified: Secondary | ICD-10-CM | POA: Diagnosis not present

## 2014-11-24 DIAGNOSIS — N390 Urinary tract infection, site not specified: Secondary | ICD-10-CM | POA: Diagnosis not present

## 2014-11-25 ENCOUNTER — Encounter: Payer: Self-pay | Admitting: Internal Medicine

## 2014-11-26 DIAGNOSIS — Z0000 Encounter for general adult medical examination without abnormal findings: Secondary | ICD-10-CM | POA: Diagnosis not present

## 2014-11-28 DIAGNOSIS — M1A09X0 Idiopathic chronic gout, multiple sites, without tophus (tophi): Secondary | ICD-10-CM | POA: Diagnosis not present

## 2014-11-28 DIAGNOSIS — E876 Hypokalemia: Secondary | ICD-10-CM | POA: Diagnosis not present

## 2014-11-28 DIAGNOSIS — E785 Hyperlipidemia, unspecified: Secondary | ICD-10-CM | POA: Diagnosis not present

## 2014-11-28 DIAGNOSIS — Z23 Encounter for immunization: Secondary | ICD-10-CM | POA: Diagnosis not present

## 2014-11-28 DIAGNOSIS — I509 Heart failure, unspecified: Secondary | ICD-10-CM | POA: Diagnosis not present

## 2014-12-02 ENCOUNTER — Encounter: Payer: Self-pay | Admitting: Cardiology

## 2014-12-05 ENCOUNTER — Telehealth (HOSPITAL_COMMUNITY): Payer: Self-pay | Admitting: *Deleted

## 2014-12-05 MED ORDER — POTASSIUM CHLORIDE CRYS ER 20 MEQ PO TBCR: EXTENDED_RELEASE_TABLET | ORAL | Status: DC

## 2014-12-05 NOTE — Telephone Encounter (Signed)
-----   Message from Larey Dresser, MD sent at 12/04/2014  5:17 PM EDT ----- K is low.  Would suggest she increase KCl to 40 mEq daily.  LDL is extremely high.  Can she try a statin again? Would try crestor 10 mg daily.   If she does not think she can start a statin, needs Zetia 10 mg daily.

## 2014-12-05 NOTE — Telephone Encounter (Signed)
Notes Recorded by Scarlette Calico, RN on 12/05/2014 at 9:11 AM Spoke w/pt, she states Dr Loney Loh had increased her KCL to 40 meq every other day alternating with 20 meq every other day, she states she is unable to tolerate any statins and has also tried Zetia in the past and is unable to tolerate. She also states the she has stopped the [facility] because she could tolerate it either, it made her extremely fatigued muscles ached   Med list updated

## 2014-12-19 ENCOUNTER — Encounter: Payer: Self-pay | Admitting: Internal Medicine

## 2014-12-19 ENCOUNTER — Ambulatory Visit (HOSPITAL_COMMUNITY)
Admission: RE | Admit: 2014-12-19 | Discharge: 2014-12-19 | Disposition: A | Discharge status: Home / Self Care | Payer: Medicare Other | Source: Ambulatory Visit | Attending: Internal Medicine | Admitting: Internal Medicine

## 2014-12-19 VITALS — BP 112/64 | HR 83 | Wt 254.8 lb

## 2014-12-19 DIAGNOSIS — I429 Cardiomyopathy, unspecified: Secondary | ICD-10-CM | POA: Diagnosis not present

## 2014-12-19 DIAGNOSIS — M109 Gout, unspecified: Secondary | ICD-10-CM | POA: Diagnosis not present

## 2014-12-19 DIAGNOSIS — I5022 Chronic systolic (congestive) heart failure: Secondary | ICD-10-CM | POA: Insufficient documentation

## 2014-12-19 DIAGNOSIS — Z87891 Personal history of nicotine dependence: Secondary | ICD-10-CM | POA: Diagnosis not present

## 2014-12-19 DIAGNOSIS — I447 Left bundle-branch block, unspecified: Secondary | ICD-10-CM | POA: Insufficient documentation

## 2014-12-19 DIAGNOSIS — I480 Paroxysmal atrial fibrillation: Secondary | ICD-10-CM | POA: Diagnosis not present

## 2014-12-19 DIAGNOSIS — I4729 Other ventricular tachycardia: Secondary | ICD-10-CM

## 2014-12-19 DIAGNOSIS — Z79899 Other long term (current) drug therapy: Secondary | ICD-10-CM | POA: Diagnosis not present

## 2014-12-19 DIAGNOSIS — I4510 Unspecified right bundle-branch block: Secondary | ICD-10-CM | POA: Insufficient documentation

## 2014-12-19 DIAGNOSIS — I472 Ventricular tachycardia: Secondary | ICD-10-CM | POA: Insufficient documentation

## 2014-12-19 DIAGNOSIS — I10 Essential (primary) hypertension: Secondary | ICD-10-CM | POA: Diagnosis not present

## 2014-12-19 DIAGNOSIS — E119 Type 2 diabetes mellitus without complications: Secondary | ICD-10-CM | POA: Insufficient documentation

## 2014-12-19 DIAGNOSIS — E785 Hyperlipidemia, unspecified: Secondary | ICD-10-CM | POA: Insufficient documentation

## 2014-12-19 DIAGNOSIS — Z7901 Long term (current) use of anticoagulants: Secondary | ICD-10-CM | POA: Insufficient documentation

## 2014-12-19 DIAGNOSIS — Z794 Long term (current) use of insulin: Secondary | ICD-10-CM | POA: Insufficient documentation

## 2014-12-19 DIAGNOSIS — Z95810 Presence of automatic (implantable) cardiac defibrillator: Secondary | ICD-10-CM | POA: Diagnosis not present

## 2014-12-19 NOTE — Patient Instructions (Signed)
We will contact you in 4 months to schedule your next appointment.  

## 2014-12-19 NOTE — Progress Notes (Signed)
Advanced Heart Failure Medication Review by a Pharmacist  Does the patient  feel that his/her medications are working for him/her?  yes  Has the patient been experiencing any side effects to the medications prescribed?  no  Does the patient measure his/her own blood pressure or blood glucose at home?  yes  Does the patient have any problems obtaining medications due to transportation or finances?   no  Understanding of regimen: excellent Understanding of indications: excellent Potential of compliance: excellent    Pharmacist comments: Patient presents to heart failure clinic and medications were reviewed with a pharmacist. She has a good understanding of her medications and reports adherence at this time. She uses her metolazone ~1x/week and takes an extra potassium at that time. Otherwise alternating days taking 49meq and 59meq of potassium. Patient reports that she took Entresto for 5 days and then stopped because she could not tolerate it due to fatigue and muscle aches. Patient also taking Xarelto 20mg  daily for afib - on correct dosing with CrCl >50.   Megan E. Supple, Pharm.D Clinical Pharmacy Resident Pager: [phone] 12/19/2014 9:32 AM

## 2014-12-19 NOTE — Progress Notes (Signed)
Patient ID: Sara Walker, female   DOB: 1951/07/09, 64 y.o.   MRN: [id number]    PCP: Dr. Shelia Walker  Primary Cardiology: Dr. Cherlynn Walker is a 64 y/o woman with morbid obesity, DM, breast CA, VT and systolic HF due to severe nonischemic cardiomyopathy (EF 20-25% echo 12/15) related to previous anthracycline chemotherapy and showed substantial improvement after cardiac resynchronization therapy from a clinical standpoint (St Jude CRT-D), although LVEF remains severely depressed. She has had asymptomatic device detected paroxysmal atrial fibrillation. She does not have a history of stroke TIA or other embolic events and is on chronic anticoagulation with Xarelto. Referred by Dr. [name] to HF Clinic for evaluation of advanced therapies.   She had 2 ICD shocks in the early morning of August 14, 2014 for 2 separate episodes of ventricular tachyarrhythmia that occurred within an hour of each other. Remote download of her device showed appropriate discharge for polymorphic ventricular tachycardia. She was unaware of the shocks, but remembers waking up around 3 AM with brief nausea. The next day she was completely asymptomatic. Recently she had had major adjustments to her insulin regimen. Laboratory tests were significant for moderate hypokalemia with a potassium of 3.1, normal magnesium level and slightly elevated creatinine of 1.2. Metolazone was discontinued and additional potassium supplements were given as well as increasing the dose of spironolactone. Lab tests showed improvement in potassium level and renal function.  CPX done in 4/16.  She had a submaximal effort, but based on available data, peak VO2 seemed to be mildly reduced compared to age-matched sedentary subjects.  The major limitation appeared to be obesity with resulting restrictive lung physiology.  Today she returns for follow up. Overall feels ok. Denies SOB/PND/Orhtopnea.   Intolerant entresto and said that she felt worse. Weight at  home has been stable. Tries to limit fluid intake and she stopped drinking caffeine. Busy with her granddaughter. Taking all medications.   Corevue - Ok.    Labs 08/19/14  K 3.5 Creatinine 0.96 09/18/2014: K 3.4  4/48/2016:  K 4.1 Creatinine 1.07 Mag 1.6   CPX (4/16): peak VO2 10.6, slope 31.1, RER 0.97 => submaximal effort, mildly reduced peak VO2 based on submaximal data (compared to sedentary normals), major restriction to functional capacity appears to be obesity and restrictive lung physiology.    Allergies  Allergen Reactions  . Lasix [Furosemide]     Causes gout  . Ace Inhibitors Cough  . Codeine Other (See Comments)    Crazy  . Digitalis Other (See Comments)    "saw yellow circles"  . Levaquin [Levofloxacin In D5w]     Gout   . Other Other (See Comments)    Nitro spray: reaction unknown  . Statins Other (See Comments)    Reaction unknown    Current Outpatient Prescriptions  Medication Sig Dispense Refill  . allopurinol (ZYLOPRIM) 300 MG tablet Take 450 mg once a day    . carvedilol (COREG CR) 80 MG 24 hr capsule Take 1 capsule (80 mg total) by mouth daily. 90 capsule 3  . Insulin Lispro, Human, (HUMALOG [place]) Inject 100-135 Units into the skin daily.    . magnesium oxide (MAG-OX) 400 MG tablet Take 0.5 tablets (200 mg total) by mouth 3 (three) times daily. (Patient taking differently: Take 600 mg by mouth daily. ) 135 tablet 3  . metolazone (ZAROXOLYN) 5 MG tablet Take 1 tablet (5 mg total) by mouth daily as needed (take if weight is over 255 lbs.). 90 tablet 3  .  potassium chloride SA (K-DUR,KLOR-CON) 20 MEQ tablet Take 2 tabs every other day alternating with 1 tab every other day 60 tablet 5  . rivaroxaban (XARELTO) 20 MG TABS tablet Take 1 tablet (20 mg total) by mouth daily with supper. 15 tablet 0  . spironolactone (ALDACTONE) 50 MG tablet Take 1 tablet (50 mg total) by mouth daily. 90 tablet 2  . torsemide (DEMADEX) 100 MG tablet TAKE ONE TABLET BY MOUTH ONCE DAILY 90  tablet 3  . vitamin B-12 (CYANOCOBALAMIN) 500 MCG tablet Take 500 mcg by mouth daily.    . colchicine 0.6 MG tablet Take 0.6 mg by mouth daily as needed (for pain).      No current facility-administered medications for this encounter.    Past Medical History  Diagnosis Date  . Goiter   . DM2 (diabetes mellitus, type 2)   . Chronic systolic heart failure   . Invasive ductal carcinoma of breast   . Gout   . Hyperlipidemia   . Hypertension   . Breast cancer   . LBBB (left bundle branch block)     evidnece of RBBB fall 2012  . Cardiomyopathy secondary to chemotherapy     a.  doxurubicin;  b. s/p SJM Quadra Assura CRT-D, model [id number] Q, Ser# [id number]  . Syncope   . PONV (postoperative nausea and vomiting)     nausea post ICD placement relieved with Zofran  . Coronary artery disease   . Angina   . CHF (congestive heart failure)   . Shortness of breath   . Nonischemic cardiomyopathy     anthracycline-related  . Automatic implantable cardioverter-defibrillator in situ   . Pacemaker     Past Surgical History  Procedure Laterality Date  . Mastectomy      right  . Tunneled venous catheter placement      removed  . Breast surgery      Left mastectomy  . Cardiac defibrillator placement      St.Jude Quadra  . Cesarean section  [place]  . Dilation and curettage of uterus  1975  . Portacath placement  1991  . Foot surgery  1994  . Nm myocar perf wall motion  12/01/2011    no ischemia; EF 22%  . Bi-ventricular implantable cardioverter defibrillator N/A 12/12/2011    Procedure: BI-VENTRICULAR IMPLANTABLE CARDIOVERTER DEFIBRILLATOR  (CRT-D);  Surgeon: Sara Sprang, MD;  Location: [facility] CATH LAB;  Service: Cardiovascular;  Laterality: N/A;    Family History  Problem Relation Age of Onset  . Heart attack Father   . Hypertension Brother   . Diabetes Brother     History   Social History  . Marital Status: Divorced    Spouse Name: N/A  . Number of Children: N/A  . Years of  Education: N/A   Occupational History  . Not on file.   Social History Main Topics  . Smoking status: Former Research scientist (life sciences)  . Smokeless tobacco: Never Used  . Alcohol Use: No  . Drug Use: No  . Sexual Activity: Not Currently   Other Topics Concern  . Not on file   Social History Narrative   PHYSICAL EXAM BP 112/64 mmHg  Pulse 83  Wt 254 lb 12.8 oz (115.577 kg)  SpO2 96% General: Alert, oriented x3, no distress.  Obese. Ambulated in the clinic without difficulty.  Head: no evidence of trauma, PERRL, EOMI, no exophtalmos or lid lag, no myxedema, no xanthelasma; normal ears, nose and oropharynx Neck: Thick, JVP 5-6 cm; brisk carotid  pulses without delay and no carotid bruits   Chest: clear to auscultation  Cardiovascular: RRR no m/r/g  S/p R mastectomy Abdomen: obese. no tenderness or distention, no masses by palpation, no abnormal pulsatility or arterial bruits, normal bowel sounds, no hepatosplenomegaly  Extremities: No lower extremity edema.   Neurological: grossly nonfocal   BMET    Component Value Date/Time   NA 135 10/13/2014 1007   NA 127* 08/29/2013 0720   K 4.1 10/13/2014 1007   CL 92* 10/13/2014 1007   CO2 30* 10/13/2014 1007   GLUCOSE 257* 10/13/2014 1007   GLUCOSE 165* 08/29/2013 0720   BUN 22 10/13/2014 1007   BUN 53* 08/29/2013 0720   CREATININE 1.07* 10/13/2014 1007   CREATININE 1.04 08/20/2013 1318   CALCIUM 9.7 10/13/2014 1007   GFRNONAA 55* 10/13/2014 1007   GFRAA 64 10/13/2014 1007     ASSESSMENT AND PLAN  1. Chronic systolic heart failure secondary to nonischemic (anthracycline related) cardiomyopathy (1991): Has St Jude CRT-D.  Echo (12/15) with EF 20-25%.  CPX (4/16) was submaximal but suggested only a mild circulatory defect compared to age-matched sedentary normals.  Main limitation appeared to be due to restrictive respiratory physiology likely from obesity.   NYHA II-III. Volume status stable.  - Continue torsemide 100 mg daily.    -  Continue Carvedilol CR 80 mg daily  - Not on dig due to side effects - She has not been able to tolerate ACEI or ARB. Intolerant entresto due to fatigue and nausea. .  - Continue 25 spironolactone daily 2. Polymorphic VT: Has ICD.  3. Paroxysmal atrial fibrillation: NSR today.  Continue Xarelto.  4. Obesity: Encouraged weight loss.   Follow up in 3-4 months.   CLEGG,AMY NP-C  12/19/2014

## 2014-12-22 ENCOUNTER — Encounter: Payer: Self-pay | Admitting: Cardiology

## 2014-12-23 DIAGNOSIS — I10 Essential (primary) hypertension: Secondary | ICD-10-CM | POA: Diagnosis not present

## 2014-12-23 DIAGNOSIS — E785 Hyperlipidemia, unspecified: Secondary | ICD-10-CM | POA: Diagnosis not present

## 2014-12-23 DIAGNOSIS — E1165 Type 2 diabetes mellitus with hyperglycemia: Secondary | ICD-10-CM | POA: Diagnosis not present

## 2015-01-19 NOTE — Progress Notes (Signed)
Patient ID: Sara Walker, female   DOB: [birth year], 64 y.o.   MRN: [id number]     Cardiology Office Note   Date:  01/20/2015   ID:  Sara Walker, DOB 30-Mar-1951, MRN [id number]  PCP:  Horatio Pel, MD  Cardiologist:   Sanda Klein, MD   Chief Complaint  Patient presents with  . 3 MONTHS    Patient has felt light headed, dizzy, SOB, chest pain, and pressure.      History of Present Illness: Sara Walker is a 64 y.o. female who presents for severe nonischemic cardiomyopathy and advanced (stage D) combined systolic and diastolic heart failure, s/p CRT-D, history of paroxysmal atrial fibrillation and polymorphic VT requiring icD shocks..  Losartan was stopped due to cough. Madelyn Flavors and felt worse (fatigue worsened, muscle aches), stopped after 5 days.  [place] has had a remarkably quiet clinical course for 5 months. This is reflected in the absence of atrial fibrillation or ventricular tachycardia on device check, ever since her defibrillator shocks for polymorphic VT in early February. She has essentially 100% biventricular pacing and never requires atrial pacing. Her thoracic impedance (corevue) has been very steady. She has not required an additional dose of metolazone in a few weeks. She generally feels well.  Past Medical History  Diagnosis Date  . Goiter   . DM2 (diabetes mellitus, type 2)   . Chronic systolic heart failure   . Invasive ductal carcinoma of breast   . Gout   . Hyperlipidemia   . Hypertension   . Breast cancer   . LBBB (left bundle branch block)     evidnece of RBBB fall 2012  . Cardiomyopathy secondary to chemotherapy     a.  doxurubicin;  b. s/p SJM Quadra Assura CRT-D, model [id number] Q, Ser# [id number]  . Syncope   . PONV (postoperative nausea and vomiting)     nausea post ICD placement relieved with Zofran  . Coronary artery disease   . Angina   . CHF (congestive heart failure)   . Shortness of breath   . Nonischemic cardiomyopathy    anthracycline-related  . Automatic implantable cardioverter-defibrillator in situ   . Pacemaker     Past Surgical History  Procedure Laterality Date  . Mastectomy      right  . Tunneled venous catheter placement      removed  . Breast surgery      Left mastectomy  . Cardiac defibrillator placement      St.Jude Quadra  . Cesarean section  [place]  . Dilation and curettage of uterus  1975  . Portacath placement  1991  . Foot surgery  1994  . Nm myocar perf wall motion  12/01/2011    no ischemia; EF 22%  . Bi-ventricular implantable cardioverter defibrillator N/A 12/12/2011    Procedure: BI-VENTRICULAR IMPLANTABLE CARDIOVERTER DEFIBRILLATOR  (CRT-D);  Surgeon: Deboraha Sprang, MD;  [facility];  Service: Cardiovascular;  Laterality: N/A;     Current Outpatient Prescriptions  Medication Sig Dispense Refill  . allopurinol (ZYLOPRIM) 300 MG tablet Take 450 mg once a day    . carvedilol (COREG CR) 80 MG 24 hr capsule Take 1 capsule (80 mg total) by mouth daily. 90 capsule 3  . colchicine 0.6 MG tablet Take 0.6 mg by mouth daily as needed (for pain).     . Insulin Lispro, Human, (HUMALOG [place]) Inject 100-135 Units into the skin daily.    . magnesium oxide (MAG-OX) 400 MG tablet Take  0.5 tablets (200 mg total) by mouth 3 (three) times daily. (Patient taking differently: Take 600 mg by mouth daily. ) 135 tablet 3  . metolazone (ZAROXOLYN) 5 MG tablet Take 1 tablet (5 mg total) by mouth daily as needed (take if weight is over 255 lbs.). 90 tablet 3  . potassium chloride SA (K-DUR,KLOR-CON) 20 MEQ tablet Take 2 tabs every other day alternating with 1 tab every other day 60 tablet 5  . rivaroxaban (XARELTO) 20 MG TABS tablet Take 1 tablet (20 mg total) by mouth daily with supper. 15 tablet 0  . spironolactone (ALDACTONE) 50 MG tablet Take 1 tablet (50 mg total) by mouth daily. 90 tablet 2  . torsemide (DEMADEX) 100 MG tablet TAKE ONE TABLET BY MOUTH ONCE DAILY 90 tablet 3  . vitamin  B-12 (CYANOCOBALAMIN) 500 MCG tablet Take 500 mcg by mouth daily.     No current facility-administered medications for this visit.    Allergies:   Lasix; Ace inhibitors; Codeine; Digitalis; Levaquin; Other; and Statins    Social History:  The patient  reports that she has quit smoking. She has never used smokeless tobacco. She reports that she does not drink alcohol or use illicit drugs.   Family History:  The patient's family history includes Diabetes in her brother; Heart attack in her father; Hypertension in her brother.    ROS:  Please see the history of present illness.    Otherwise, review of systems positive for fatigue.   All other systems are reviewed and negative.    PHYSICAL EXAM: VS:  BP 128/78 mmHg  Pulse 91  Ht 5\' 8"  (1.727 m)  Wt 250 lb (113.399 kg)  BMI 38.02 kg/m2 , BMI Body mass index is 38.02 kg/(m^2).  General: Alert, oriented x3, no distress Head: no evidence of trauma, PERRL, EOMI, no exophtalmos or lid lag, no myxedema, no xanthelasma; normal ears, nose and oropharynx Neck: normal jugular venous pulsations and no hepatojugular reflux; brisk carotid pulses without delay and no carotid bruits Chest: clear to auscultation, no signs of consolidation by percussion or palpation, normal fremitus, symmetrical and full respiratory excursions Cardiovascular: normal position and quality of the apical impulse, regular rhythm, normal first and second heart sounds, no  murmurs, rubs or gallops Abdomen: no tenderness or distention, no masses by palpation, no abnormal pulsatility or arterial bruits, normal bowel sounds, no hepatosplenomegaly Extremities: no clubbing, cyanosis or edema; 2+ radial, ulnar and brachial pulses bilaterally; 2+ right femoral, posterior tibial and dorsalis pedis pulses; 2+ left femoral, posterior tibial and dorsalis pedis pulses; no subclavian or femoral bruits Neurological: grossly nonfocal Psych: euthymic mood, full affect   EKG:  EKG is not  ordered today.  Recent Labs: 10/13/2014: BUN 22; Creatinine, Ser 1.07*; Magnesium 1.6; Potassium 4.1; Sodium 135     Wt Readings from Last 3 Encounters:  01/20/15 250 lb (113.399 kg)  12/19/14 254 lb 12.8 oz (115.577 kg)  11/14/14 249 lb 12 oz (113.286 kg)      ASSESSMENT AND PLAN:  1. Congestive heart failure stage D, NYHA class III, euvolemic by exam and thoracic impedance recordings. She is recommended to take metolazone on days when her weight exceeds 255 pounds. Due to her previous problems with hypokalemia and hypomagnesemia I have asked her to call if she ever needs to take the metolazone more than twice weekly. May need left ventricular assist device in the future. Losartan was stopped at last appointment due to a cough which she was convinced was from  that drug.  2. Poor exercise tolerance is multifactorial and related to congestive heart failure and severe obesity, I'm not certain about the diagnosis of chronotropic incompetence whether it be beta blocker-related or not. Consideration could be given to a more aggressive sensor setting on her defibrillator, but I'm not sure that that would help. I would not reduce the dose of beta blocker since it is beneficial for her congestive heart failure and ventricular and atrial arrhythmia. Plan to discuss with Dr. Haroldine Laws.  3. Normally functioning CRT-D - device check shows 100% biV pacing, no recent atrial or ventricular tachyarrhythmia. The left ventricular capture threshold is relatively high 2.625V@0 .8 ms but seems to be covered by the current device settings. Auto capture was turned on. LV lead is pacing M3-RV coil.  4. Paroxysmal ventricular tachycardia - 2 ICD shocks in February 2016, for polymorphic VT, precipitated by major electrolyte abnormalities  5. Paroxysmal atrial fibrillation continue anticoagulant therapy. The burden of atrial arrhythmia is very low    Current medicines are reviewed at length with the patient today.   The patient does not have concerns regarding medicines.  The following changes have been made:  no change  Labs/ tests ordered today include:   Orders Placed This Encounter  Procedures  . Implantable device check    Patient Instructions  Remote monitoring is used to monitor your ICD from home. This monitoring reduces the number of office visits required to check your device to one time per year. It allows [place] to keep an eye on the functioning of your device to ensure it is working properly. You are scheduled for a device check from home on 04/21/2015. You may send your transmission at any time that day. If you have a wireless device, the transmission will be sent automatically. After your physician reviews your transmission, you will receive a postcard with your next transmission date.  Your physician recommends that you schedule a follow-up appointment in: 6 months with Dr.Trejuan Matherne       Signed, Sanda Klein, MD  01/20/2015 3:48 PM    Sanda Klein, MD, [facility] [phone] office [phone] pager

## 2015-01-20 ENCOUNTER — Ambulatory Visit (INDEPENDENT_AMBULATORY_CARE_PROVIDER_SITE_OTHER): Payer: Medicare Other | Admitting: Cardiovascular Disease

## 2015-01-20 ENCOUNTER — Encounter: Payer: Self-pay | Admitting: Cardiovascular Disease

## 2015-01-20 VITALS — BP 128/78 | HR 91 | Ht 68.0 in | Wt 250.0 lb

## 2015-01-20 DIAGNOSIS — I427 Cardiomyopathy due to drug and external agent: Secondary | ICD-10-CM

## 2015-01-20 DIAGNOSIS — T451X5A Adverse effect of antineoplastic and immunosuppressive drugs, initial encounter: Secondary | ICD-10-CM

## 2015-01-20 DIAGNOSIS — I480 Paroxysmal atrial fibrillation: Secondary | ICD-10-CM

## 2015-01-20 DIAGNOSIS — I472 Ventricular tachycardia: Secondary | ICD-10-CM | POA: Diagnosis not present

## 2015-01-20 DIAGNOSIS — I5043 Acute on chronic combined systolic (congestive) and diastolic (congestive) heart failure: Secondary | ICD-10-CM | POA: Diagnosis not present

## 2015-01-20 DIAGNOSIS — Z95810 Presence of automatic (implantable) cardiac defibrillator: Secondary | ICD-10-CM

## 2015-01-20 DIAGNOSIS — I4729 Other ventricular tachycardia: Secondary | ICD-10-CM

## 2015-01-20 LAB — CUP PACEART INCLINIC DEVICE CHECK
Brady Statistic RA Percent Paced: 0.05 %
HIGH POWER IMPEDANCE MEASURED VALUE: 99 Ohm
Lead Channel Impedance Value: 350 Ohm
Lead Channel Impedance Value: 600 Ohm
Lead Channel Impedance Value: 812.5 Ohm
Lead Channel Pacing Threshold Amplitude: 0.625 V
Lead Channel Pacing Threshold Amplitude: 0.875 V
Lead Channel Pacing Threshold Pulse Width: 0.5 ms
Lead Channel Pacing Threshold Pulse Width: 0.5 ms
Lead Channel Pacing Threshold Pulse Width: 0.8 ms
Lead Channel Sensing Intrinsic Amplitude: 12 mV
Lead Channel Setting Pacing Amplitude: 2 V
Lead Channel Setting Pacing Pulse Width: 0.5 ms
Lead Channel Setting Pacing Pulse Width: 0.8 ms
Lead Channel Setting Sensing Sensitivity: 0.5 mV
MDC IDC MSMT BATTERY REMAINING LONGEVITY: 34.8 mo
MDC IDC MSMT LEADCHNL LV PACING THRESHOLD AMPLITUDE: 2.625 V
MDC IDC MSMT LEADCHNL RA SENSING INTR AMPL: 3.4 mV
MDC IDC SESS DTM: 20160712115816
MDC IDC SET LEADCHNL LV PACING AMPLITUDE: 3.625
MDC IDC SET LEADCHNL RA PACING AMPLITUDE: 1.875
MDC IDC SET ZONE DETECTION INTERVAL: 250 ms
MDC IDC STAT BRADY RV PERCENT PACED: 99.79 %
Pulse Gen Serial Number: 7036093
Zone Setting Detection Interval: 300 ms

## 2015-01-20 NOTE — Patient Instructions (Signed)
Remote monitoring is used to monitor your ICD from home. This monitoring reduces the number of office visits required to check your device to one time per year. It allows [place] to keep an eye on the functioning of your device to ensure it is working properly. You are scheduled for a device check from home on 04/21/2015. You may send your transmission at any time that day. If you have a wireless device, the transmission will be sent automatically. After your physician reviews your transmission, you will receive a postcard with your next transmission date.  Your physician recommends that you schedule a follow-up appointment in: 6 months with Dr.Croitoru

## 2015-02-13 ENCOUNTER — Encounter: Payer: Self-pay | Admitting: Internal Medicine

## 2015-02-17 DIAGNOSIS — E1165 Type 2 diabetes mellitus with hyperglycemia: Secondary | ICD-10-CM | POA: Diagnosis not present

## 2015-03-01 ENCOUNTER — Other Ambulatory Visit: Payer: Self-pay | Admitting: Cardiovascular Disease

## 2015-03-02 NOTE — Telephone Encounter (Signed)
Rx(s) sent to pharmacy electronically.  

## 2015-03-05 DIAGNOSIS — E1165 Type 2 diabetes mellitus with hyperglycemia: Secondary | ICD-10-CM | POA: Diagnosis not present

## 2015-03-05 DIAGNOSIS — I4891 Unspecified atrial fibrillation: Secondary | ICD-10-CM | POA: Diagnosis not present

## 2015-04-17 DIAGNOSIS — M2550 Pain in unspecified joint: Secondary | ICD-10-CM | POA: Diagnosis not present

## 2015-04-17 DIAGNOSIS — M1A09X0 Idiopathic chronic gout, multiple sites, without tophus (tophi): Secondary | ICD-10-CM | POA: Diagnosis not present

## 2015-04-17 DIAGNOSIS — N183 Chronic kidney disease, stage 3 (moderate): Secondary | ICD-10-CM | POA: Diagnosis not present

## 2015-04-21 ENCOUNTER — Encounter: Payer: Self-pay | Admitting: Cardiovascular Disease

## 2015-04-21 ENCOUNTER — Ambulatory Visit (INDEPENDENT_AMBULATORY_CARE_PROVIDER_SITE_OTHER): Payer: Medicare Other | Admitting: *Deleted

## 2015-04-21 DIAGNOSIS — I472 Ventricular tachycardia, unspecified: Secondary | ICD-10-CM

## 2015-04-21 DIAGNOSIS — I5022 Chronic systolic (congestive) heart failure: Secondary | ICD-10-CM

## 2015-04-21 NOTE — Progress Notes (Signed)
Remote ICD transmission.   

## 2015-04-23 LAB — CUP PACEART REMOTE DEVICE CHECK
Battery Remaining Percentage: 49 %
Battery Voltage: 2.92 V
Brady Statistic AP VP Percent: 1 %
Brady Statistic AS VP Percent: 99 %
Brady Statistic RA Percent Paced: 1 %
Date Time Interrogation Session: 20161011060018
HIGH POWER IMPEDANCE MEASURED VALUE: 105 Ohm
HighPow Impedance: 105 Ohm
Implantable Lead Implant Date: 20130603
Implantable Lead Implant Date: 20130603
Implantable Lead Location: 753858
Implantable Lead Location: 753860
Lead Channel Impedance Value: 610 Ohm
Lead Channel Impedance Value: 710 Ohm
Lead Channel Pacing Threshold Amplitude: 0.625 V
Lead Channel Pacing Threshold Amplitude: 0.875 V
Lead Channel Pacing Threshold Amplitude: 3.25 V
Lead Channel Pacing Threshold Pulse Width: 0.5 ms
Lead Channel Sensing Intrinsic Amplitude: 12 mV
Lead Channel Setting Pacing Amplitude: 1.875
Lead Channel Setting Pacing Pulse Width: 0.5 ms
MDC IDC LEAD IMPLANT DT: 20130603
MDC IDC LEAD LOCATION: 753859
MDC IDC MSMT BATTERY REMAINING LONGEVITY: 32 mo
MDC IDC MSMT LEADCHNL LV PACING THRESHOLD PULSEWIDTH: 0.8 ms
MDC IDC MSMT LEADCHNL RA IMPEDANCE VALUE: 410 Ohm
MDC IDC MSMT LEADCHNL RA PACING THRESHOLD PULSEWIDTH: 0.5 ms
MDC IDC MSMT LEADCHNL RA SENSING INTR AMPL: 3.5 mV
MDC IDC SET LEADCHNL LV PACING AMPLITUDE: 4.25 V
MDC IDC SET LEADCHNL LV PACING PULSEWIDTH: 0.8 ms
MDC IDC SET LEADCHNL RV PACING AMPLITUDE: 2 V
MDC IDC SET LEADCHNL RV SENSING SENSITIVITY: 0.5 mV
MDC IDC SET ZONE DETECTION INTERVAL: 250 ms
MDC IDC SET ZONE VENDOR TYPE: 773188
MDC IDC STAT BRADY AP VS PERCENT: 1 %
MDC IDC STAT BRADY AS VS PERCENT: 1 %
Pulse Gen Serial Number: 7036093
Zone Setting Detection Interval: 300 ms
Zone Setting Vendor Type Category: 773185

## 2015-04-30 ENCOUNTER — Encounter: Payer: Self-pay | Admitting: Cardiology

## 2015-05-14 ENCOUNTER — Encounter (HOSPITAL_COMMUNITY): Payer: Medicare Other

## 2015-05-20 ENCOUNTER — Other Ambulatory Visit: Payer: Self-pay | Admitting: Cardiovascular Disease

## 2015-05-21 ENCOUNTER — Ambulatory Visit (HOSPITAL_COMMUNITY)
Admission: RE | Admit: 2015-05-21 | Discharge: 2015-05-21 | Disposition: A | Discharge status: Home / Self Care | Payer: Medicare Other | Source: Ambulatory Visit | Attending: Internal Medicine | Admitting: Internal Medicine

## 2015-05-21 ENCOUNTER — Encounter (HOSPITAL_COMMUNITY): Payer: Self-pay

## 2015-05-21 VITALS — BP 108/76 | HR 81 | Wt 258.4 lb

## 2015-05-21 DIAGNOSIS — I480 Paroxysmal atrial fibrillation: Secondary | ICD-10-CM | POA: Insufficient documentation

## 2015-05-21 DIAGNOSIS — T451X5A Adverse effect of antineoplastic and immunosuppressive drugs, initial encounter: Secondary | ICD-10-CM | POA: Diagnosis not present

## 2015-05-21 DIAGNOSIS — Z8249 Family history of ischemic heart disease and other diseases of the circulatory system: Secondary | ICD-10-CM | POA: Insufficient documentation

## 2015-05-21 DIAGNOSIS — Z794 Long term (current) use of insulin: Secondary | ICD-10-CM | POA: Insufficient documentation

## 2015-05-21 DIAGNOSIS — E785 Hyperlipidemia, unspecified: Secondary | ICD-10-CM | POA: Diagnosis not present

## 2015-05-21 DIAGNOSIS — Z885 Allergy status to narcotic agent status: Secondary | ICD-10-CM | POA: Insufficient documentation

## 2015-05-21 DIAGNOSIS — Z87891 Personal history of nicotine dependence: Secondary | ICD-10-CM | POA: Insufficient documentation

## 2015-05-21 DIAGNOSIS — Z853 Personal history of malignant neoplasm of breast: Secondary | ICD-10-CM | POA: Insufficient documentation

## 2015-05-21 DIAGNOSIS — I427 Cardiomyopathy due to drug and external agent: Secondary | ICD-10-CM | POA: Insufficient documentation

## 2015-05-21 DIAGNOSIS — I110 Hypertensive heart disease with heart failure: Secondary | ICD-10-CM | POA: Insufficient documentation

## 2015-05-21 DIAGNOSIS — E119 Type 2 diabetes mellitus without complications: Secondary | ICD-10-CM | POA: Diagnosis not present

## 2015-05-21 DIAGNOSIS — Z833 Family history of diabetes mellitus: Secondary | ICD-10-CM | POA: Diagnosis not present

## 2015-05-21 DIAGNOSIS — I5022 Chronic systolic (congestive) heart failure: Secondary | ICD-10-CM | POA: Insufficient documentation

## 2015-05-21 DIAGNOSIS — M109 Gout, unspecified: Secondary | ICD-10-CM | POA: Diagnosis not present

## 2015-05-21 DIAGNOSIS — G4733 Obstructive sleep apnea (adult) (pediatric): Secondary | ICD-10-CM | POA: Insufficient documentation

## 2015-05-21 DIAGNOSIS — Z79899 Other long term (current) drug therapy: Secondary | ICD-10-CM | POA: Diagnosis not present

## 2015-05-21 DIAGNOSIS — Z95810 Presence of automatic (implantable) cardiac defibrillator: Secondary | ICD-10-CM | POA: Insufficient documentation

## 2015-05-21 DIAGNOSIS — Z7902 Long term (current) use of antithrombotics/antiplatelets: Secondary | ICD-10-CM | POA: Diagnosis not present

## 2015-05-21 NOTE — Patient Instructions (Signed)
Continue current medications  Follow up in 6 months with Dr Haroldine Laws

## 2015-05-21 NOTE — Progress Notes (Signed)
Patient ID: BARNEY CLACK, female   DOB: [birth year], 64 y.o.   MRN: [id number] P    PCP: Dr. Shelia Media  Primary Cardiology: Dr. Cherlynn Kaiser is a 64 y/o woman with morbid obesity, DM, breast CA, VT and systolic HF due to severe nonischemic cardiomyopathy (EF 20-25% echo 12/15) related to previous anthracycline chemotherapy and showed substantial improvement after cardiac resynchronization therapy from a clinical standpoint (St Jude CRT-D), although LVEF remains severely depressed. She has had asymptomatic device detected paroxysmal atrial fibrillation. She does not have a history of stroke TIA or other embolic events and is on chronic anticoagulation with Xarelto. Referred by Dr. Sallyanne Kuster to [facility] Clinic for evaluation of advanced therapies.   She had 2 ICD shocks in the early morning of August 14, 2014 for 2 separate episodes of ventricular tachyarrhythmia that occurred within an hour of each other. Remote download of her device showed appropriate discharge for polymorphic ventricular tachycardia. She was unaware of the shocks, but remembers waking up around 3 AM with brief nausea. The next day she was completely asymptomatic. Recently she had had major adjustments to her insulin regimen. Laboratory tests were significant for moderate hypokalemia with a potassium of 3.1, normal magnesium level and slightly elevated creatinine of 1.2. Metolazone was discontinued and additional potassium supplements were given as well as increasing the dose of spironolactone. Lab tests showed improvement in potassium level and renal function.  CPX done in 4/16.  She had a submaximal effort, but based on available data, peak VO2 seemed to be mildly reduced compared to age-matched sedentary subjects.  The major limitation appeared to be obesity with resulting restrictive lung physiology.  Today she returns for follow up. Overall feels ok but having trouble sleeping.  Denies SOB/PND/Orhtopnea. Limited activity.   Intolerant  entresto and said that she felt worse. She is not weighing at home. In the past she uses a CPAP but she was intolerant due to claustrophobia. Tries to limit fluid intake and she stopped drinking caffeine. Busy with her grand children.  Taking all medications.   Corevue - Ok.    Labs 08/19/14  K 3.5 Creatinine 0.96 09/18/2014: K 3.4  10/2014:  K 4.1 Creatinine 1.07 Mag 1.6  01/2015:   CPX (4/16): peak VO2 10.6, slope 31.1, RER 0.97 => submaximal effort, mildly reduced peak VO2 based on submaximal data (compared to sedentary normals), major restriction to functional capacity appears to be obesity and restrictive lung physiology.    Allergies  Allergen Reactions  . Lasix [Furosemide]     Causes gout  . Ace Inhibitors Cough  . Codeine Other (See Comments)    Crazy  . Digitalis Other (See Comments)    "saw yellow circles"  . Levaquin [Levofloxacin In D5w]     Gout   . Other Other (See Comments)    Nitro spray: reaction unknown  . Statins Other (See Comments)    Reaction unknown    Current Outpatient Prescriptions  Medication Sig Dispense Refill  . allopurinol (ZYLOPRIM) 300 MG tablet Take 450 mg once a day    . COREG CR 80 MG 24 hr capsule TAKE ONE CAPSULE BY MOUTH ONCE DAILY 90 capsule 1  . Insulin Lispro, Human, (HUMALOG [place]) Inject 100-135 Units into the skin daily.    . magnesium oxide (MAG-OX) 400 MG tablet Take 400-800 mg by mouth daily. Take 800 mg (2 tablets) alternating with 400 mg (1 tablet) daily    . metolazone (ZAROXOLYN) 5 MG tablet Take 1 tablet (  5 mg total) by mouth daily as needed (take if weight is over 255 lbs.). 90 tablet 3  . potassium chloride SA (K-DUR,KLOR-CON) 20 MEQ tablet Take 2 tabs every other day alternating with 1 tab every other day 60 tablet 5  . rivaroxaban (XARELTO) 20 MG TABS tablet Take 1 tablet (20 mg total) by mouth daily with supper. 15 tablet 0  . spironolactone (ALDACTONE) 50 MG tablet TAKE ONE TABLET BY MOUTH ONCE DAILY 90 tablet 2  . torsemide  (DEMADEX) 100 MG tablet TAKE ONE TABLET BY MOUTH ONCE DAILY 90 tablet 3  . vitamin B-12 (CYANOCOBALAMIN) 500 MCG tablet Take 500 mcg by mouth daily.    . colchicine 0.6 MG tablet Take 0.6 mg by mouth daily as needed (for pain).      No current facility-administered medications for this encounter.    Past Medical History  Diagnosis Date  . Goiter   . DM2 (diabetes mellitus, type 2) ([place])   . Chronic systolic heart failure ([place])   . Invasive ductal carcinoma of breast ([place])   . Gout   . Hyperlipidemia   . Hypertension   . Breast cancer ([place])   . LBBB (left bundle branch block)     evidnece of RBBB fall 2012  . Cardiomyopathy secondary to chemotherapy [facility])     a.  doxurubicin;  b. s/p SJM Quadra Assura CRT-D, model [id number] Q, Ser# [id number]  . Syncope   . PONV (postoperative nausea and vomiting)     nausea post ICD placement relieved with Zofran  . Coronary artery disease   . Angina   . CHF (congestive heart failure) ([place])   . Shortness of breath   . Nonischemic cardiomyopathy ([facility])     anthracycline-related  . Automatic implantable cardioverter-defibrillator in situ   . Pacemaker     Past Surgical History  Procedure Laterality Date  . Mastectomy      right  . Tunneled venous catheter placement      removed  . Breast surgery      Left mastectomy  . Cardiac defibrillator placement      St.Jude Quadra  . Cesarean section  [place]  . Dilation and curettage of uterus  1975  . Portacath placement  1991  . Foot surgery  1994  . Nm myocar perf wall motion  12/01/2011    no ischemia; EF 22%  . Bi-ventricular implantable cardioverter defibrillator N/A 12/12/2011    Procedure: BI-VENTRICULAR IMPLANTABLE CARDIOVERTER DEFIBRILLATOR  (CRT-D);  Surgeon: Deboraha Sprang, MD;  Location: [facility] CATH LAB;  Service: Cardiovascular;  Laterality: N/A;    Family History  Problem Relation Age of Onset  . Heart attack Father   . Hypertension Brother   . Diabetes Brother     Social  History   Social History  . Marital Status: Divorced    Spouse Name: N/A  . Number of Children: N/A  . Years of Education: N/A   Occupational History  . Not on file.   Social History Main Topics  . Smoking status: Former Research scientist (life sciences)  . Smokeless tobacco: Never Used  . Alcohol Use: No  . Drug Use: No  . Sexual Activity: Not Currently   Other Topics Concern  . Not on file   Social History Narrative   PHYSICAL EXAM BP 108/76 mmHg  Pulse 81  Wt 258 lb 6 oz (117.198 kg)  SpO2 97% General: Alert, oriented x3, no distress.  Obese. Ambulated in the clinic without difficulty.  Head: no  evidence of trauma, PERRL, EOMI, no exophtalmos or lid lag, no myxedema, no xanthelasma; normal ears, nose and oropharynx Neck: Thick, JVP 5-6 cm; brisk carotid pulses without delay and no carotid bruits   Chest: clear to auscultation  Cardiovascular: RRR no m/r/g  S/p R mastectomy Abdomen: obese. no tenderness or distention, no masses by palpation, no abnormal pulsatility or arterial bruits, normal bowel sounds, no hepatosplenomegaly  Extremities: No lower extremity edema.   Neurological: grossly nonfocal   BMET    Component Value Date/Time   NA 135 10/13/2014 1007   NA 127* 08/29/2013 0720   K 4.1 10/13/2014 1007   CL 92* 10/13/2014 1007   CO2 30* 10/13/2014 1007   GLUCOSE 257* 10/13/2014 1007   GLUCOSE 165* 08/29/2013 0720   BUN 22 10/13/2014 1007   BUN 53* 08/29/2013 0720   CREATININE 1.07* 10/13/2014 1007   CREATININE 1.04 08/20/2013 1318   CALCIUM 9.7 10/13/2014 1007   GFRNONAA 55* 10/13/2014 1007   GFRAA 64 10/13/2014 1007     ASSESSMENT AND PLAN  1. Chronic systolic heart failure secondary to nonischemic (anthracycline related) cardiomyopathy (1991): Has St Jude CRT-D.  Echo (12/15) with EF 20-25%.  CPX (4/16) was submaximal but suggested only a mild circulatory defect compared to age-matched sedentary normals.  Main limitation appeared to be due to restrictive respiratory  physiology likely from obesity.   NYHA II-III. Volume status stable. Corvue. - Continue torsemide 100 mg daily.    - Continue Carvedilol CR 80 mg daily  - Not on dig due to side effects - She has not been able to tolerate ACEI or ARB. Intolerant entresto due to fatigue and nausea. .  - Continue 25 spironolactone daily 2. Polymorphic VT: Has ICD.  3. Paroxysmal atrial fibrillation: Regular pulse today. Continue Xarelto.  4. Obesity: Encouraged weight loss.  5. H/O OSA:  Says she was intolerant CPAP in the past.   Follow up in 6 months with Dr Haroldine Laws.   Amy Clegg NP-C  05/21/2015

## 2015-05-21 NOTE — Progress Notes (Signed)
Advanced Heart Failure Medication Review by a Pharmacist  Does the patient  feel that his/her medications are working for him/her?  yes  Has the patient been experiencing any side effects to the medications prescribed?  no  Does the patient measure his/her own blood pressure or blood glucose at home?  yes   Does the patient have any problems obtaining medications due to transportation or finances?   no  Understanding of regimen: good Understanding of indications: good Potential of compliance: good Patient understands to avoid NSAIDs. Patient understands to avoid decongestants.  Issues to address at subsequent visits: None   Pharmacist comments:  Sara Walker is a pleasant 64 yo F presenting without a medication list but able to verbalize each of her medications including dosages. She reports good compliance with her medications and did not have any specific medication-related questions or concerns for me at this time.   Ruta Hinds. Velva Harman, PharmD, BCPS, CPP Clinical Pharmacist Pager: [phone] Phone: [phone] 05/21/2015 11:42 AM      Time with patient: 6 minutes Preparation and documentation time: 2 minutes Total time: 8 minutes

## 2015-05-28 DIAGNOSIS — E785 Hyperlipidemia, unspecified: Secondary | ICD-10-CM | POA: Diagnosis not present

## 2015-05-28 DIAGNOSIS — E1165 Type 2 diabetes mellitus with hyperglycemia: Secondary | ICD-10-CM | POA: Diagnosis not present

## 2015-05-28 DIAGNOSIS — I10 Essential (primary) hypertension: Secondary | ICD-10-CM | POA: Diagnosis not present

## 2015-06-02 DIAGNOSIS — E1165 Type 2 diabetes mellitus with hyperglycemia: Secondary | ICD-10-CM | POA: Diagnosis not present

## 2015-06-02 DIAGNOSIS — I10 Essential (primary) hypertension: Secondary | ICD-10-CM | POA: Diagnosis not present

## 2015-06-14 ENCOUNTER — Other Ambulatory Visit: Payer: Self-pay | Admitting: Cardiovascular Disease

## 2015-06-15 NOTE — Telephone Encounter (Signed)
Rx request sent to pharmacy.  

## 2015-06-16 ENCOUNTER — Other Ambulatory Visit: Payer: Self-pay | Admitting: Cardiovascular Disease

## 2015-06-21 ENCOUNTER — Other Ambulatory Visit: Payer: Self-pay | Admitting: Cardiovascular Disease

## 2015-06-22 NOTE — Telephone Encounter (Signed)
REFILL 

## 2015-07-28 ENCOUNTER — Encounter: Payer: Self-pay | Admitting: Cardiovascular Disease

## 2015-07-28 ENCOUNTER — Ambulatory Visit (INDEPENDENT_AMBULATORY_CARE_PROVIDER_SITE_OTHER): Payer: Medicare Other | Admitting: Cardiovascular Disease

## 2015-07-28 VITALS — BP 111/66 | HR 74 | Ht 68.0 in | Wt 266.0 lb

## 2015-07-28 DIAGNOSIS — I447 Left bundle-branch block, unspecified: Secondary | ICD-10-CM | POA: Diagnosis not present

## 2015-07-28 DIAGNOSIS — I10 Essential (primary) hypertension: Secondary | ICD-10-CM

## 2015-07-28 DIAGNOSIS — I480 Paroxysmal atrial fibrillation: Secondary | ICD-10-CM

## 2015-07-28 DIAGNOSIS — Z794 Long term (current) use of insulin: Secondary | ICD-10-CM

## 2015-07-28 DIAGNOSIS — Z95810 Presence of automatic (implantable) cardiac defibrillator: Secondary | ICD-10-CM

## 2015-07-28 DIAGNOSIS — E119 Type 2 diabetes mellitus without complications: Secondary | ICD-10-CM

## 2015-07-28 DIAGNOSIS — I472 Ventricular tachycardia: Secondary | ICD-10-CM

## 2015-07-28 DIAGNOSIS — I5022 Chronic systolic (congestive) heart failure: Secondary | ICD-10-CM

## 2015-07-28 DIAGNOSIS — T451X5A Adverse effect of antineoplastic and immunosuppressive drugs, initial encounter: Secondary | ICD-10-CM

## 2015-07-28 DIAGNOSIS — J209 Acute bronchitis, unspecified: Secondary | ICD-10-CM

## 2015-07-28 DIAGNOSIS — I427 Cardiomyopathy due to drug and external agent: Secondary | ICD-10-CM

## 2015-07-28 DIAGNOSIS — I4729 Other ventricular tachycardia: Secondary | ICD-10-CM

## 2015-07-28 MED ORDER — DOXYCYCLINE HYCLATE 100 MG PO CAPS: 100.0000 mg | ORAL_CAPSULE | Freq: Two times a day (BID) | ORAL | Status: DC

## 2015-07-28 NOTE — Patient Instructions (Signed)
Your physician has recommended you make the following change in your medication: START DOXYCYCLINE 100 MG TWICE DAILY X 7 DAYS.  THIS HAS BEEN SENT TO YOUR PHARMACY.  If you need a refill on your cardiac medications before your next appointment, please call your pharmacy.  Remote monitoring is used to monitor your ICD from home. This monitoring reduces the number of office visits required to check your device to one time per year. It allows [place] to monitor the functioning of your device to ensure it is working properly. You are scheduled for a device check from home on October 27, 2015. You may send your transmission at any time that day. If you have a wireless device, the transmission will be sent automatically. After your physician reviews your transmission, you will receive a postcard with your next transmission date.  Dr. Sallyanne Kuster recommends that you schedule a follow-up appointment in: [place] ([place]).

## 2015-07-28 NOTE — Progress Notes (Signed)
Patient ID: Sara Walker, female   DOB: 1951-04-24, 65 y.o.   MRN: [id number]     Cardiology Office Note    Date:  07/28/2015   ID:  Sara Walker, DOB 04-29-1951, MRN [id number]  PCP:  Horatio Pel, MD  Cardiologist:   Sanda Klein, MD   Chief Complaint  Patient presents with  . Follow-up    experiencing some chest pain, has shortness of breath, unsure of edema, no pain or cramping in legs, has lightheadedness & dizziness    History of Present Illness:  VIVIANNA Walker is a 65 y.o. female who presents for severe nonischemic cardiomyopathy and advanced (stage D) combined systolic and diastolic heart failure, s/p CRT-D, history of paroxysmal atrial fibrillation and polymorphic VT requiring iCD shocks (in the setting of hypokalemia). She has a left bundle branch block and showed substantial improvement in clinical status after cardiac resynchronization therapy. However, LVEF remains 20-25 percent. Roughly one year ago her pulmonary exercise testing showed a submaximal response.  Over the last several months she has not had major weight fluctuations and has not required major adjustments in diuretics. She denies orthopnea, but develops severe cough and nasal congestion when she tries to lie flat. She has developed wheezing. She denies fever, chills. She has a burning sensation in her airways when she breathes. She is not producing significant sputum and denies hemoptysis. The symptoms have been going on now for a couple of weeks and are not improving.  Device interrogation shows virtually 100% biventricular pacing. There has been no atrial fibrillation and no ventricular tachycardia since last device check. Lead parameters remain within desirable range, we exception of a relatively high left ventricular pacing threshold that is not much change from last device check, but shows a slow steady increase since device implantation. Thoracic impedance which was quite fall tiled the beginning of  the year has been very steady over the last several months. There is no evidence of fluid overload by corvue.  She describes regular menstrual periods, which have recurred ever since her defibrillator shocks. Prior to that she had not had. Since chemotherapy. She has occasional epistaxis. She has not had any serious bleeding problems.  Past Medical History  Diagnosis Date  . Goiter   . DM2 (diabetes mellitus, type 2) ([place])   . Chronic systolic heart failure ([place])   . Invasive ductal carcinoma of breast ([place])   . Gout   . Hyperlipidemia   . Hypertension   . Breast cancer ([place])   . LBBB (left bundle branch block)     evidnece of RBBB fall 2012  . Cardiomyopathy secondary to chemotherapy [facility])     a.  doxurubicin;  b. s/p SJM Quadra Assura CRT-D, model [id number] Q, Ser# [id number]  . Syncope   . PONV (postoperative nausea and vomiting)     nausea post ICD placement relieved with Zofran  . Coronary artery disease   . Angina   . CHF (congestive heart failure) ([place])   . Shortness of breath   . Nonischemic cardiomyopathy (HCC)     anthracycline-related  . Automatic implantable cardioverter-defibrillator in situ   . Pacemaker     Past Surgical History  Procedure Laterality Date  . Mastectomy      right  . Tunneled venous catheter placement      removed  . Breast surgery      Left mastectomy  . Cardiac defibrillator placement      St.Jude Quadra  . Cesarean section  1976 &  1983  . Dilation and curettage of uterus  1975  . Portacath placement  1991  . Foot surgery  1994  . Nm myocar perf wall motion  12/01/2011    no ischemia; EF 22%  . Bi-ventricular implantable cardioverter defibrillator N/A 12/12/2011    Procedure: BI-VENTRICULAR IMPLANTABLE CARDIOVERTER DEFIBRILLATOR  (CRT-D);  Surgeon: Deboraha Sprang, MD;  Location: [facility] C[facility] LAB;  Service: Cardiovascular;  Laterality: N/A;    Outpatient Prescriptions Prior to Visit  Medication Sig Dispense Refill  . allopurinol (ZYLOPRIM) 300  MG tablet Take 450 mg once a day    . colchicine 0.6 MG tablet Take 0.6 mg by mouth daily as needed (for pain).     . COREG CR 80 MG 24 hr capsule TAKE ONE CAPSULE BY MOUTH ONCE DAILY 90 capsule 1  . Insulin Lispro, Human, (HUMALOG [place]) Inject 100-135 Units into the skin daily.    Marland Kitchen KLOR-CON M20 20 MEQ tablet TAKE ONE TABLET BY MOUTH ONCE DAILY. TAKE AN EXTRA 40MEQ WHEN YOU TAKE METOLAZONE 60 tablet 1  . magnesium oxide (MAG-OX) 400 MG tablet Take 400-800 mg by mouth daily. Take 800 mg (2 tablets) alternating with 400 mg (1 tablet) daily    . metolazone (ZAROXOLYN) 5 MG tablet Take 1 tablet (5 mg total) by mouth daily as needed (take if weight is over 255 lbs.). 90 tablet 3  . spironolactone (ALDACTONE) 50 MG tablet TAKE ONE TABLET BY MOUTH ONCE DAILY 90 tablet 2  . torsemide (DEMADEX) 100 MG tablet TAKE ONE TABLET BY MOUTH ONCE DAILY 90 tablet 3  . vitamin B-12 (CYANOCOBALAMIN) 500 MCG tablet Take 500 mcg by mouth daily.    Alveda Reasons 20 MG TABS tablet TAKE ONE TABLET BY MOUTH ONCE DAILY WITH  SUPPER 90 tablet 2  . XARELTO 20 MG TABS tablet TAKE ONE TABLET BY MOUTH ONCE DAILY WITH  SUPPER (Patient not taking: Reported on 07/28/2015) 90 tablet 0   No facility-administered medications prior to visit.     Allergies:   Lasix; Ace inhibitors; Codeine; Digitalis; Levaquin; Other; and Statins   Social History   Social History  . Marital Status: Divorced    Spouse Name: N/A  . Number of Children: N/A  . Years of Education: N/A   Social History Main Topics  . Smoking status: Former Research scientist (life sciences)  . Smokeless tobacco: Never Used  . Alcohol Use: No  . Drug Use: No  . Sexual Activity: Not Currently   Other Topics Concern  . None   Social History Narrative     Family History:  The patient's family history includes Diabetes in her brother; Heart attack in her father; Hypertension in her brother.   ROS:   Please see the history of present illness.    ROS All other systems reviewed and are  negative.   PHYSICAL EXAM:   VS:  BP 111/66 mmHg  Pulse 74  Ht 5\' 8"  (1.727 m)  Wt 266 lb (120.657 kg)  BMI 40.45 kg/m2   GEN: Well nourished, well developed, in no acute distress. Wheezy cough. No acute distress. Morbid obesity limits her physical exam. HEENT: normal Neck: no JVD, carotid bruits, or masses, healthy left subclavian device site Cardiac: RRR; no murmurs, rubs, or gallops,no edema  Respiratory:  clear to auscultation bilaterally, normal work of breathing GI: soft, nontender, nondistended, + BS MS: no deformity or atrophy Skin: warm and dry, no rash Neuro:  Alert and Oriented x 3, Strength and sensation are intact Psych: euthymic  mood, full affect  Wt Readings from Last 3 Encounters:  07/28/15 266 lb (120.657 kg)  05/21/15 258 lb 6 oz (117.198 kg)  01/20/15 250 lb (113.399 kg)      Studies/Labs Reviewed:   EKG:  EKG is ordered today.  The ekg ordered today demonstrates atrial sensed, biventricular paced rhythm with R wave in lead V1  Recent Labs: 10/13/2014: BUN 22; Creatinine, Ser 1.07*; Magnesium 1.6; Potassium 4.1; Sodium 135   Lipid Panel No results found for: CHOL, TRIG, HDL, CHOLHDL, VLDL, LDLCALC, LDLDIRECT  Additional studies/ records that were reviewed today include:  Heart failure clinic, November    ASSESSMENT:    1. Chronic systolic heart failure ([place])   2. Cardiomyopathy secondary to chemotherapy ([place])   3. LBBB (left bundle branch block)   4. Paroxysmal atrial fibrillation (HCC)   5. Ventricular tachycardia (paroxysmal) ([place])   6. Biventricular ICD (implantable cardioverter-defibrillator) in place   7. Essential hypertension   8. Acute bronchitis, unspecified organism   9. Morbid obesity, unspecified obesity type ([place])   10. Type 2 diabetes mellitus without complication, without long-term current use of insulin (HCC)      PLAN:  In order of problems listed above:  1. CHF: NYHA functional class II-III, clinically euvolemic. Euvolemic  supported by normal thoracic impedance trend. I don't think her respiratory symptoms are due to heart failure. 2. Nonischemic (anthracycline related) cardiomyopathy, severely depressed LVEF 20-25 percent, not much change recently 3. LBBB: Substantial symptom improvement after implementation of CRT, despite absence of significant increase in LVEF 4. Atrial fibrillation: none since last device check. On appropriate anticoagulation. She has several "nuisance" bleeding complications, but no serious events. Encouraged her to continue gynecological follow-up to make sure that she does not have pathological uterine bleeding. At this point anticoagulation benefit outweighs the risks. 5. VT/TdP: Previous events occurred in the setting of heart failure exacerbation and electrolyte imbalance. Appropriate defibrillator discharges roughly one year ago. 6. Normal CRT-D device function. Remote downloads every 3 months. 7. HTN: Normal blood pressure on current regimen. She is on maximum usual dose of carvedilol. Intolerance to ace inhibitors and angiotensin receptor blockers due to cough. 8. Acute bronchitis: She has numerous comorbid conditions and I think it is wise to treat her bronchitis with antibiotics. Lloyd agents that prolonged QT interval due to history of torsades. 9. Morbid obesity: Weight loss would be highly beneficial. She has had tremendous swings in her weight over the last few years, usually related to swings in mood 10. DM: On high doses of insulin, consistent with insulin resistance    Medication Adjustments/Labs and Tests Ordered: Current medicines are reviewed at length with the patient today.  Concerns regarding medicines are outlined above.  Medication changes, Labs and Tests ordered today are listed in the Patient Instructions below. Patient Instructions  Your physician has recommended you make the following change in your medication: START DOXYCYCLINE 100 MG TWICE DAILY X 7 DAYS.  THIS HAS  BEEN SENT TO YOUR PHARMACY.  If you need a refill on your cardiac medications before your next appointment, please call your pharmacy.  Remote monitoring is used to monitor your ICD from home. This monitoring reduces the number of office visits required to check your device to one time per year. It allows [place] to monitor the functioning of your device to ensure it is working properly. You are scheduled for a device check from home on October 27, 2015. You may send your transmission at any time that day.  If you have a wireless device, the transmission will be sent automatically. After your physician reviews your transmission, you will receive a postcard with your next transmission date.  Dr. Sallyanne Kuster recommends that you schedule a follow-up appointment in: [place] ([place]).            Mikael Spray, MD  07/28/2015 5:08 PM    [facility] [address], [place], [place]  [address] Phone: [phone]; Fax: [phone]

## 2015-08-25 DIAGNOSIS — E1165 Type 2 diabetes mellitus with hyperglycemia: Secondary | ICD-10-CM | POA: Diagnosis not present

## 2015-09-01 DIAGNOSIS — I10 Essential (primary) hypertension: Secondary | ICD-10-CM | POA: Diagnosis not present

## 2015-09-01 DIAGNOSIS — E785 Hyperlipidemia, unspecified: Secondary | ICD-10-CM | POA: Diagnosis not present

## 2015-09-01 DIAGNOSIS — E1165 Type 2 diabetes mellitus with hyperglycemia: Secondary | ICD-10-CM | POA: Diagnosis not present

## 2015-09-11 ENCOUNTER — Other Ambulatory Visit: Payer: Self-pay | Admitting: Cardiovascular Disease

## 2015-09-11 NOTE — Telephone Encounter (Signed)
Rx(s) sent to pharmacy electronically.  

## 2015-09-14 ENCOUNTER — Telehealth: Payer: Self-pay

## 2015-09-14 NOTE — Telephone Encounter (Signed)
Prior auth for Xarelto 20mg sent to [facility]. 

## 2015-09-15 ENCOUNTER — Telehealth: Payer: Self-pay

## 2015-09-15 NOTE — Telephone Encounter (Signed)
Xarelto approved through 07/10/2016. PA- [id number].

## 2015-09-16 ENCOUNTER — Telehealth: Payer: Self-pay | Admitting: Cardiovascular Disease

## 2015-09-16 NOTE — Telephone Encounter (Signed)
Is it may be simply because it is the beginning of the year and she has a deductible? or will it costs $300 all year? Sara Walker, isn't there a patient assistance program that can help with her deductible?

## 2015-09-16 NOTE — Telephone Encounter (Signed)
xarelto costs $300/90 days - a PA was done on 3/7 but it will still cost her this much Patient on insulin pump - cannot hardly afford this either She is on social security disability She asks if she can take warfarin - she took this years ago when on chemo -- patient used to have her PT/INR checked at [facility] in [place] D will only pay for xarelto   Notified patient that I will route the message to Dr. Loletha Grayer & Erasmo Downer to discuss anticoagulation options

## 2015-09-16 NOTE — Telephone Encounter (Signed)
Pt says she can no longer afford the Xarelto. Please call,she need something to replace the Xarelto.

## 2015-09-17 MED ORDER — APIXABAN 5 MG PO TABS
5.0000 mg | ORAL_TABLET | Freq: Two times a day (BID) | ORAL | Status: DC
Start: 2015-09-17 — End: 2015-09-18

## 2015-09-17 NOTE — Telephone Encounter (Signed)
Cost is most likely deductible that has to be met.  After that will usually be $40-50/month.  We can convert to warfarin, patient needs to know will involve weekly blood draws for first 3-6 weeks to get dose calculated.

## 2015-09-17 NOTE — Telephone Encounter (Signed)
Called patient and she states another nurse had just spoken with her and was going to call her pharmacy

## 2015-09-17 NOTE — Telephone Encounter (Signed)
Spoke with patient - PATIENT STATES SHE CAN NOT AFFORD XARELTO WILLING TO TAKE OTHER MEDICATION IF LESS EXPENSIVE, OR TAKE WARFARIN FOR THE COST .  RN contacted [place] is not on her formulary -  RN spoke to [facility] pharmacist- Erasmo Downer  May take Eliqius 5 mg b.i.d. in the place of Xarelto.  RN spoke to [employer] and [employer] prior authorization -for [employer].   ELIQUIS was approved, will cost patient $172.91 for 3 months supply. RN spoke to patient. Patient states she can afford that price. Patient states to send in prescription. She will start medication once she completes the Xarelto. Patient does not  Want to convert to warfarin at present time.

## 2015-09-18 ENCOUNTER — Telehealth: Payer: Self-pay | Admitting: *Deleted

## 2015-09-18 MED ORDER — RIVAROXABAN 20 MG PO TABS: 20.0000 mg | ORAL_TABLET | Freq: Every day | ORAL | Status: DC

## 2015-09-18 NOTE — Telephone Encounter (Signed)
Spoke to patient Patient states when she went to pick up eliquis .  It will be the same cost as Xarelto. Patient states pharmacist gave RN incorrect amount for [facility] cost for 90 days Patient would like to continue with Xarelto and cancel Eliquis. Patient thinks she will get to her deductible and the cost will go down after this refill.  RN cancelled Eliquis, reinstated Xarelto Patient verbalized understanding

## 2015-09-30 ENCOUNTER — Telehealth (HOSPITAL_COMMUNITY): Payer: Self-pay | Admitting: Vascular Surgery

## 2015-09-30 NOTE — Telephone Encounter (Signed)
Left pt message to make 6 month f/u in May

## 2015-10-07 ENCOUNTER — Telehealth: Payer: Self-pay | Admitting: Cardiovascular Disease

## 2015-10-07 ENCOUNTER — Other Ambulatory Visit (HOSPITAL_COMMUNITY): Payer: Self-pay

## 2015-10-07 MED ORDER — MAGNESIUM OXIDE 400 MG PO TABS: 400.0000 mg | ORAL_TABLET | Freq: Every day | ORAL | Status: DC

## 2015-10-07 NOTE — Telephone Encounter (Addendum)
error 

## 2015-10-08 ENCOUNTER — Telehealth: Payer: Self-pay | Admitting: Cardiovascular Disease

## 2015-10-08 MED ORDER — MAGNESIUM OXIDE 400 MG PO TABS: 400.0000 mg | ORAL_TABLET | Freq: Every day | ORAL | Status: DC

## 2015-10-08 NOTE — Telephone Encounter (Signed)
Please call,needs to verify directions for her Magnesium Oxide.

## 2015-10-08 NOTE — Telephone Encounter (Signed)
Med refilled.

## 2015-10-08 NOTE — Telephone Encounter (Signed)
New message     Talk to the nurse about her presc for magnesium.  She said Dr Bensimhon's office did not give clear instructions to the pharmacist.  She want our office to call [facility] at [address] and tell them the dosage is 2 tablets on even days and 1 tablet on odd days.  Patient also want a 90 day supply.  If any problems, please call.

## 2015-10-08 NOTE — Telephone Encounter (Signed)
Called pharmacy, clarified instructions.

## 2015-10-14 ENCOUNTER — Other Ambulatory Visit: Payer: Self-pay | Admitting: Cardiovascular Disease

## 2015-10-14 NOTE — Telephone Encounter (Signed)
Rx refill sent to pharmacy. 

## 2015-10-15 LAB — CUP PACEART INCLINIC DEVICE CHECK
Implantable Lead Implant Date: 20130603
Implantable Lead Location: 753858
Implantable Lead Location: 753859
Implantable Lead Location: 753860
Lead Channel Setting Pacing Amplitude: 4.25 V
Lead Channel Setting Pacing Pulse Width: 0.8 ms
Lead Channel Setting Sensing Sensitivity: 0.5 mV
MDC IDC LEAD IMPLANT DT: 20130603
MDC IDC LEAD IMPLANT DT: 20130603
MDC IDC SESS DTM: 20170406113050
MDC IDC SET LEADCHNL RA PACING AMPLITUDE: 1.875
MDC IDC SET LEADCHNL RV PACING AMPLITUDE: 2 V
MDC IDC SET LEADCHNL RV PACING PULSEWIDTH: 0.5 ms
Pulse Gen Serial Number: 7036093

## 2015-10-16 DIAGNOSIS — M1A09X0 Idiopathic chronic gout, multiple sites, without tophus (tophi): Secondary | ICD-10-CM | POA: Diagnosis not present

## 2015-10-16 DIAGNOSIS — N183 Chronic kidney disease, stage 3 (moderate): Secondary | ICD-10-CM | POA: Diagnosis not present

## 2015-10-16 DIAGNOSIS — M2550 Pain in unspecified joint: Secondary | ICD-10-CM | POA: Diagnosis not present

## 2015-10-27 ENCOUNTER — Telehealth: Payer: Self-pay | Admitting: Cardiology

## 2015-10-27 ENCOUNTER — Ambulatory Visit (INDEPENDENT_AMBULATORY_CARE_PROVIDER_SITE_OTHER): Payer: Medicare Other | Admitting: *Deleted

## 2015-10-27 DIAGNOSIS — Z95810 Presence of automatic (implantable) cardiac defibrillator: Secondary | ICD-10-CM | POA: Diagnosis not present

## 2015-10-27 DIAGNOSIS — I427 Cardiomyopathy due to drug and external agent: Secondary | ICD-10-CM

## 2015-10-27 DIAGNOSIS — T451X5A Adverse effect of antineoplastic and immunosuppressive drugs, initial encounter: Secondary | ICD-10-CM

## 2015-10-27 NOTE — Telephone Encounter (Signed)
Spoke with pt and reminded pt of remote transmission that is due today. Pt verbalized understanding.   

## 2015-10-28 NOTE — Progress Notes (Signed)
Remote ICD transmission.   

## 2015-11-13 ENCOUNTER — Other Ambulatory Visit: Payer: Self-pay | Admitting: Cardiovascular Disease

## 2015-11-13 NOTE — Telephone Encounter (Signed)
Rx request sent to pharmacy.  

## 2015-11-20 ENCOUNTER — Ambulatory Visit (HOSPITAL_COMMUNITY)
Admission: RE | Admit: 2015-11-20 | Discharge: 2015-11-20 | Disposition: A | Discharge status: Home / Self Care | Payer: Medicare Other | Source: Ambulatory Visit | Attending: Internal Medicine | Admitting: Internal Medicine

## 2015-11-20 VITALS — BP 94/52 | HR 72 | Wt 258.4 lb

## 2015-11-20 DIAGNOSIS — E785 Hyperlipidemia, unspecified: Secondary | ICD-10-CM | POA: Insufficient documentation

## 2015-11-20 DIAGNOSIS — Z95810 Presence of automatic (implantable) cardiac defibrillator: Secondary | ICD-10-CM | POA: Diagnosis not present

## 2015-11-20 DIAGNOSIS — I427 Cardiomyopathy due to drug and external agent: Secondary | ICD-10-CM | POA: Diagnosis not present

## 2015-11-20 DIAGNOSIS — Z87891 Personal history of nicotine dependence: Secondary | ICD-10-CM | POA: Insufficient documentation

## 2015-11-20 DIAGNOSIS — I5022 Chronic systolic (congestive) heart failure: Secondary | ICD-10-CM | POA: Diagnosis not present

## 2015-11-20 DIAGNOSIS — E119 Type 2 diabetes mellitus without complications: Secondary | ICD-10-CM | POA: Diagnosis not present

## 2015-11-20 DIAGNOSIS — E669 Obesity, unspecified: Secondary | ICD-10-CM | POA: Diagnosis not present

## 2015-11-20 DIAGNOSIS — R079 Chest pain, unspecified: Secondary | ICD-10-CM | POA: Diagnosis not present

## 2015-11-20 DIAGNOSIS — Z794 Long term (current) use of insulin: Secondary | ICD-10-CM | POA: Insufficient documentation

## 2015-11-20 DIAGNOSIS — Z833 Family history of diabetes mellitus: Secondary | ICD-10-CM | POA: Diagnosis not present

## 2015-11-20 DIAGNOSIS — Z888 Allergy status to other drugs, medicaments and biological substances status: Secondary | ICD-10-CM | POA: Insufficient documentation

## 2015-11-20 DIAGNOSIS — I2510 Atherosclerotic heart disease of native coronary artery without angina pectoris: Secondary | ICD-10-CM | POA: Insufficient documentation

## 2015-11-20 DIAGNOSIS — I110 Hypertensive heart disease with heart failure: Secondary | ICD-10-CM | POA: Insufficient documentation

## 2015-11-20 DIAGNOSIS — Z7901 Long term (current) use of anticoagulants: Secondary | ICD-10-CM | POA: Diagnosis not present

## 2015-11-20 DIAGNOSIS — Z853 Personal history of malignant neoplasm of breast: Secondary | ICD-10-CM | POA: Diagnosis not present

## 2015-11-20 DIAGNOSIS — I472 Ventricular tachycardia: Secondary | ICD-10-CM | POA: Diagnosis not present

## 2015-11-20 DIAGNOSIS — M109 Gout, unspecified: Secondary | ICD-10-CM | POA: Insufficient documentation

## 2015-11-20 DIAGNOSIS — Z79899 Other long term (current) drug therapy: Secondary | ICD-10-CM | POA: Insufficient documentation

## 2015-11-20 DIAGNOSIS — Z881 Allergy status to other antibiotic agents status: Secondary | ICD-10-CM | POA: Insufficient documentation

## 2015-11-20 DIAGNOSIS — I480 Paroxysmal atrial fibrillation: Secondary | ICD-10-CM | POA: Diagnosis not present

## 2015-11-20 DIAGNOSIS — Z8249 Family history of ischemic heart disease and other diseases of the circulatory system: Secondary | ICD-10-CM | POA: Diagnosis not present

## 2015-11-20 DIAGNOSIS — T451X5A Adverse effect of antineoplastic and immunosuppressive drugs, initial encounter: Secondary | ICD-10-CM | POA: Diagnosis not present

## 2015-11-20 NOTE — Progress Notes (Signed)
Patient ID: MICHALE LAWWILL, female   DOB: 01-11-1951, 65 y.o.   MRN: [id number] Patient ID: TEOSHA ADI, female   DOB: March 11, 1951, 65 y.o.   MRN: [id number]    ADVANCED HF CLINIC NOTE  PCP: Dr. Shelia Media  Primary Cardiology: Dr. Cherlynn Kaiser is a 65 y/o woman with morbid obesity, DM, breast CA, VT and systolic HF due to severe nonischemic cardiomyopathy (EF 20-25% echo 12/15) related to previous anthracycline chemotherapy and showed substantial improvement after cardiac resynchronization therapy from a clinical standpoint (St Jude CRT-D), although LVEF remains severely depressed. She has had asymptomatic device detected paroxysmal atrial fibrillation. She does not have a history of stroke TIA or other embolic events and is on chronic anticoagulation with Xarelto. Referred by Dr. Sallyanne Kuster to HF Clinic for evaluation of advanced therapies.   She had 2 ICD shocks in the early morning of August 14, 2014 for 2 separate episodes of ventricular tachyarrhythmia that occurred within an hour of each other. Remote download of her device showed appropriate discharge for polymorphic ventricular tachycardia. She was unaware of the shocks, but remembers waking up around 3 AM with brief nausea. The next day she was completely asymptomatic. Recently she had had major adjustments to her insulin regimen. Laboratory tests were significant for moderate hypokalemia with a potassium of 3.1, normal magnesium level and slightly elevated creatinine of 1.2. Metolazone was discontinued and additional potassium supplements were given as well as increasing the dose of spironolactone. Lab tests showed improvement in potassium level and renal function.  CPX done in 4/16.  She had a submaximal effort, but based on available data, peak VO2 seemed to be mildly reduced compared to age-matched sedentary subjects.  The major limitation appeared to be obesity with resulting restrictive lung physiology.  Saw Dr. Recardo Evangelist in 1/17 and doing  well.   Today she returns for follow up. Overall feels ok. Chronic mild DOE.  No PND/Orthopnea. No edema. Has been having chest pain which she feels is due to anxiety. Has been been followed Dr. Recardo Evangelist. Had negative stress test.  Intolerant entresto and said that she felt worse. Weight at home down 8 pounds due to recent bronchitis. Taking torsemide 50 bid. Only using metoalzone about 2x/month. SBP 110-120 usually. Tries to limit fluid intake. Busy with her granddaughter. Taking all medications.   Corevue - Ok.    Labs 08/19/14  K 3.5 Creatinine 0.96 09/18/2014: K 3.4  4/48/2016:  K 4.1 Creatinine 1.07 Mag 1.6   CPX (4/16): peak VO2 10.6, slope 31.1, RER 0.97 => submaximal effort, mildly reduced peak VO2 based on submaximal data (compared to sedentary normals), major restriction to functional capacity appears to be obesity and restrictive lung physiology.    Allergies  Allergen Reactions  . Lasix [Furosemide]     Causes gout  . Ace Inhibitors Cough  . Codeine Other (See Comments)    Crazy  . Digitalis Other (See Comments)    "saw yellow circles"  . Levaquin [Levofloxacin In D5w]     Gout   . Other Other (See Comments)    Nitro spray: reaction unknown  . Statins Other (See Comments)    Reaction unknown  . Zyrtec [Cetirizine] Other (See Comments)    Slept for a day then was up for 4 days afterwards    Current Outpatient Prescriptions  Medication Sig Dispense Refill  . allopurinol (ZYLOPRIM) 300 MG tablet Take 450 mg once a day    . colchicine 0.6 MG tablet Take 0.6 mg  by mouth daily as needed (for pain).     . COREG CR 80 MG 24 hr capsule TAKE ONE CAPSULE BY MOUTH ONCE DAILY 90 capsule 1  . Insulin Lispro, Human, (HUMALOG [place]) Inject 100-150 Units into the skin daily.     . magnesium oxide (MAG-OX) 400 MG tablet Take 1-2 tablets (400-800 mg total) by mouth daily. Take 800 mg (2 tablets) alternating with 400 mg (1 tablet) daily 135 tablet 3  . metolazone (ZAROXOLYN) 5 MG tablet  Take 1 tablet (5 mg total) by mouth daily as needed (take if weight is over 255 lbs.). 90 tablet 3  . potassium chloride SA (K-DUR,KLOR-CON) 20 MEQ tablet Take 20-40 mEq by mouth daily. Take 40 meq (2 tablets) daily alternating 20 meq (1 tablet) daily    . rivaroxaban (XARELTO) 20 MG TABS tablet Take 1 tablet (20 mg total) by mouth daily with supper. 90 tablet 3  . spironolactone (ALDACTONE) 50 MG tablet TAKE ONE TABLET BY MOUTH ONCE DAILY 90 tablet 1  . torsemide (DEMADEX) 100 MG tablet Take 50 mg by mouth 2 (two) times daily.    . vitamin B-12 (CYANOCOBALAMIN) 500 MCG tablet Take 500 mcg by mouth daily.     No current facility-administered medications for this encounter.    Past Medical History  Diagnosis Date  . Goiter   . DM2 (diabetes mellitus, type 2) ([place])   . Chronic systolic heart failure ([place])   . Invasive ductal carcinoma of breast ([place])   . Gout   . Hyperlipidemia   . Hypertension   . Breast cancer ([place])   . LBBB (left bundle branch block)     evidnece of RBBB fall 2012  . Cardiomyopathy secondary to chemotherapy [facility])     a.  doxurubicin;  b. s/p SJM Quadra Assura CRT-D, model [id number] Q, Ser# [id number]  . Syncope   . PONV (postoperative nausea and vomiting)     nausea post ICD placement relieved with Zofran  . Coronary artery disease   . Angina   . CHF (congestive heart failure) ([place])   . Shortness of breath   . Nonischemic cardiomyopathy (HCC)     anthracycline-related  . Automatic implantable cardioverter-defibrillator in situ   . Pacemaker     Past Surgical History  Procedure Laterality Date  . Mastectomy      right  . Tunneled venous catheter placement      removed  . Breast surgery      Left mastectomy  . Cardiac defibrillator placement      St.Jude Quadra  . Cesarean section  [place]  . Dilation and curettage of uterus  1975  . Portacath placement  1991  . Foot surgery  1994  . Nm myocar perf wall motion  12/01/2011    no ischemia; EF 22%  .  Bi-ventricular implantable cardioverter defibrillator N/A 12/12/2011    Procedure: BI-VENTRICULAR IMPLANTABLE CARDIOVERTER DEFIBRILLATOR  (CRT-D);  Surgeon: Deboraha Sprang, MD;  Location[facility] CATH LAB;  Service: Cardiovascular;  Laterality: N/A;    Family History  Problem Relation Age of Onset  . Heart attack Father   . Hypertension Brother   . Diabetes Brother     Social History   Social History  . Marital Status: Divorced    Spouse Name: N/A  . Number of Children: N/A  . Years of Education: N/A   Occupational History  . Not on file.   Social History Main Topics  . Smoking status: Former Research scientist (life sciences)  .  Smokeless tobacco: Never Used  . Alcohol Use: No  . Drug Use: No  . Sexual Activity: Not Currently   Other Topics Concern  . Not on file   Social History Narrative   PHYSICAL EXAM BP 94/52 mmHg  Pulse 72  Wt 258 lb 6.4 oz (117.209 kg)  SpO2 94% General: Alert, oriented x3, no distress.  Obese. Ambulated in the clinic without difficulty.  Head: no evidence of trauma, PERRL, EOMI, no exophtalmos or lid lag, no myxedema, no xanthelasma; normal ears, nose and oropharynx Neck: Thick, JVP 5-6 cm; brisk carotid pulses without delay and no carotid bruits   Chest: clear to auscultation  Cardiovascular: RRR no m/r/g  S/p R mastectomy Abdomen: obese. no tenderness or distention, no masses by palpation, no abnormal pulsatility or arterial bruits, normal bowel sounds, no hepatosplenomegaly  Extremities: No lower extremity edema.   Neurological: grossly nonfocal   ASSESSMENT AND PLAN  1. Chronic systolic heart failure secondary to nonischemic (anthracycline related) cardiomyopathy (1991): Has St Jude CRT-D.  Echo (12/15) with EF 20-25%.  CPX (4/16) was submaximal but suggested only a mild circulatory defect compared to age-matched sedentary normals.  Main limitation appeared to be due to restrictive respiratory physiology likely from obesity.  - NYHA II-III. Volume status stable.    - Continue torsemide 50 mg bid  With PRN metoalzone - Continue Carvedilol CR 80 mg daily  - Not on dig due to side effects - She has not been able to tolerate ACEI or ARB. Intolerant entresto due to fatigue and nausea. .  - Continue 25 spironolactone daily - has labs coming up in 2 weeks with Dr. Shelia Media 2. Polymorphic VT: Has ICD.  3. Paroxysmal atrial fibrillation: NSR today.  Continue Xarelto.  4. Obesity: Encouraged weight loss.  5. Chest pain: Per Dr. Therisa Doyne, Quillian Quince MD  11/20/2015

## 2015-11-20 NOTE — Patient Instructions (Signed)
Follow up 4 months with Dr. Haroldine Laws.  Do the following things EVERYDAY: 1) Weigh yourself in the morning before breakfast. Write it down and keep it in a log. 2) Take your medicines as prescribed 3) Eat low salt foods-Limit salt (sodium) to 2000 mg per day.  4) Stay as active as you can everyday 5) Limit all fluids for the day to less than 2 liters

## 2015-11-20 NOTE — Progress Notes (Signed)
Advanced Heart Failure Medication Review by a Pharmacist  Does the patient  feel that his/her medications are working for him/her?  yes  Has the patient been experiencing any side effects to the medications prescribed?  no  Does the patient measure his/her own blood pressure or blood glucose at home?  yes   Does the patient have any problems obtaining medications due to transportation or finances?   no  Understanding of regimen: good Understanding of indications: good Potential of compliance: good Patient understands to avoid NSAIDs. Patient understands to avoid decongestants.  Issues to address at subsequent visits: None   Pharmacist comments:  Ms. Kendziorski is a pleasant 65 yo F presenting without a medication list but with good recall of her regimen including dosages. She reports excellent compliance with her regimen since she feels bad if she does not take her medications. She did not have any other medication-related questions or concerns for me at this time.   Ruta Hinds. Velva Harman, PharmD, BCPS, CPP Clinical Pharmacist Pager: [phone] Phone: [phone] 11/20/2015 9:56 AM      Time with patient: 10 minutes Preparation and documentation time: 2 minutes Total time: 12 minutes

## 2015-12-01 DIAGNOSIS — I10 Essential (primary) hypertension: Secondary | ICD-10-CM | POA: Diagnosis not present

## 2015-12-01 DIAGNOSIS — N390 Urinary tract infection, site not specified: Secondary | ICD-10-CM | POA: Diagnosis not present

## 2015-12-01 DIAGNOSIS — E785 Hyperlipidemia, unspecified: Secondary | ICD-10-CM | POA: Diagnosis not present

## 2015-12-01 DIAGNOSIS — M109 Gout, unspecified: Secondary | ICD-10-CM | POA: Diagnosis not present

## 2015-12-01 DIAGNOSIS — Z0000 Encounter for general adult medical examination without abnormal findings: Secondary | ICD-10-CM | POA: Diagnosis not present

## 2015-12-01 DIAGNOSIS — E039 Hypothyroidism, unspecified: Secondary | ICD-10-CM | POA: Diagnosis not present

## 2015-12-04 ENCOUNTER — Encounter: Payer: Self-pay | Admitting: Cardiology

## 2015-12-04 LAB — CUP PACEART REMOTE DEVICE CHECK
Implantable Lead Implant Date: 20130603
Implantable Lead Implant Date: 20130603
Implantable Lead Implant Date: 20130603
Implantable Lead Location: 753858
Implantable Lead Location: 753860
Lead Channel Impedance Value: 410 Ohm
Lead Channel Impedance Value: 560 Ohm
Lead Channel Impedance Value: 760 Ohm
Lead Channel Pacing Threshold Amplitude: 0.75 V
Lead Channel Pacing Threshold Pulse Width: 0.5 ms
Lead Channel Pacing Threshold Pulse Width: 0.8 ms
Lead Channel Sensing Intrinsic Amplitude: 12 mV
Lead Channel Sensing Intrinsic Amplitude: 2.7 mV
Lead Channel Setting Pacing Amplitude: 4.25 V
Lead Channel Setting Pacing Pulse Width: 0.5 ms
Lead Channel Setting Pacing Pulse Width: 0.8 ms
Lead Channel Setting Sensing Sensitivity: 0.5 mV
MDC IDC LEAD LOCATION: 753859
MDC IDC MSMT LEADCHNL LV PACING THRESHOLD AMPLITUDE: 3.125 V
MDC IDC MSMT LEADCHNL RV PACING THRESHOLD AMPLITUDE: 0.625 V
MDC IDC MSMT LEADCHNL RV PACING THRESHOLD PULSEWIDTH: 0.4 ms
MDC IDC PG SERIAL: 7036093
MDC IDC SESS DTM: 20170526120756
MDC IDC SET LEADCHNL RA PACING AMPLITUDE: 1.875
MDC IDC SET LEADCHNL RV PACING AMPLITUDE: 2 V

## 2015-12-08 DIAGNOSIS — Z66 Do not resuscitate: Secondary | ICD-10-CM | POA: Diagnosis not present

## 2015-12-08 DIAGNOSIS — I4891 Unspecified atrial fibrillation: Secondary | ICD-10-CM | POA: Diagnosis not present

## 2015-12-08 DIAGNOSIS — R0789 Other chest pain: Secondary | ICD-10-CM | POA: Diagnosis not present

## 2015-12-08 DIAGNOSIS — I509 Heart failure, unspecified: Secondary | ICD-10-CM | POA: Diagnosis not present

## 2015-12-22 DIAGNOSIS — D1801 Hemangioma of skin and subcutaneous tissue: Secondary | ICD-10-CM | POA: Diagnosis not present

## 2015-12-22 DIAGNOSIS — L821 Other seborrheic keratosis: Secondary | ICD-10-CM | POA: Diagnosis not present

## 2015-12-22 DIAGNOSIS — D2362 Other benign neoplasm of skin of left upper limb, including shoulder: Secondary | ICD-10-CM | POA: Diagnosis not present

## 2016-01-10 ENCOUNTER — Other Ambulatory Visit: Payer: Self-pay | Admitting: Cardiovascular Disease

## 2016-02-16 ENCOUNTER — Encounter (INDEPENDENT_AMBULATORY_CARE_PROVIDER_SITE_OTHER): Payer: Self-pay

## 2016-02-16 ENCOUNTER — Encounter: Payer: Self-pay | Admitting: Cardiovascular Disease

## 2016-02-16 ENCOUNTER — Ambulatory Visit (INDEPENDENT_AMBULATORY_CARE_PROVIDER_SITE_OTHER): Payer: Medicare Other | Admitting: Cardiovascular Disease

## 2016-02-16 VITALS — BP 122/73 | HR 80 | Ht 68.0 in | Wt 263.0 lb

## 2016-02-16 DIAGNOSIS — I447 Left bundle-branch block, unspecified: Secondary | ICD-10-CM

## 2016-02-16 DIAGNOSIS — T451X5A Adverse effect of antineoplastic and immunosuppressive drugs, initial encounter: Secondary | ICD-10-CM

## 2016-02-16 DIAGNOSIS — Z95810 Presence of automatic (implantable) cardiac defibrillator: Secondary | ICD-10-CM

## 2016-02-16 DIAGNOSIS — I472 Ventricular tachycardia: Secondary | ICD-10-CM

## 2016-02-16 DIAGNOSIS — Z794 Long term (current) use of insulin: Secondary | ICD-10-CM

## 2016-02-16 DIAGNOSIS — I427 Cardiomyopathy due to drug and external agent: Secondary | ICD-10-CM

## 2016-02-16 DIAGNOSIS — I10 Essential (primary) hypertension: Secondary | ICD-10-CM

## 2016-02-16 DIAGNOSIS — I480 Paroxysmal atrial fibrillation: Secondary | ICD-10-CM

## 2016-02-16 DIAGNOSIS — I4729 Other ventricular tachycardia: Secondary | ICD-10-CM

## 2016-02-16 DIAGNOSIS — I5043 Acute on chronic combined systolic (congestive) and diastolic (congestive) heart failure: Secondary | ICD-10-CM

## 2016-02-16 DIAGNOSIS — E119 Type 2 diabetes mellitus without complications: Secondary | ICD-10-CM

## 2016-02-16 LAB — CUP PACEART INCLINIC DEVICE CHECK
Battery Remaining Longevity: 24 mo
Battery Remaining Percentage: 37 %
Implantable Lead Implant Date: 20130603
Implantable Lead Implant Date: 20130603
Implantable Lead Implant Date: 20130603
Implantable Lead Location: 753859
Implantable Lead Location: 753860
Lead Channel Setting Pacing Amplitude: 4.25 V
Lead Channel Setting Pacing Pulse Width: 0.8 ms
Lead Channel Setting Sensing Sensitivity: 0.5 mV
MDC IDC LEAD LOCATION: 753858
MDC IDC PG SERIAL: 7036093
MDC IDC SESS DTM: 20170808090321
MDC IDC SET LEADCHNL RA PACING AMPLITUDE: 1.875
MDC IDC SET LEADCHNL RV PACING AMPLITUDE: 2 V
MDC IDC SET LEADCHNL RV PACING PULSEWIDTH: 0.5 ms

## 2016-02-16 NOTE — Patient Instructions (Signed)
Dr Sallyanne Kuster recommends that you schedule a follow-up appointment in 6 months with a device check. You will receive a reminder letter in the mail two months in advance. If you don't receive a letter, please call our office to schedule the follow-up appointment.  If you need a refill on your cardiac medications before your next appointment, please call your pharmacy.

## 2016-02-16 NOTE — Progress Notes (Signed)
Patient ID: Sara Walker, female   DOB: 03/18/1951, 65 y.o.   MRN: [id number]     Cardiology Office Note    Date:  02/16/2016   ID:  Sara Walker, DOB [birth year], MRN [id number]  PCP:  Horatio Pel, MD  Cardiologist:   Sanda Klein, MD   Chief Complaint  Patient presents with  . Follow-up    6 months  . Dizziness  . Shortness of Breath    History of Present Illness:  Sara Walker is a 65 y.o. female who presents for severe nonischemic cardiomyopathy and advanced (stage D) combined systolic and diastolic heart failure, s/p CRT-D, history of paroxysmal atrial fibrillation and polymorphic VT requiring iCD shocks (in the setting of hypokalemia). She has a left bundle branch block and showed substantial improvement in clinical status after cardiac resynchronization therapy. However, LVEF remains 20-25 percent. Roughly one year ago her pulmonary exercise testing showed a submaximal response, but suggested that restrictive ventilatory defect (related to obesity) may be the biggest limiting factor. She has been intolerant of ace inhibitors, ARB's and Enteresto.  Over the last several months she has not had major weight fluctuations and has not required major adjustments in diuretics. She denies orthopnea, PND, cough and wheezing. She denies fever, chills. Edema is infrequently a problem. She takes a metolazone tablet roughly once every 2 weeks for increasing dyspnea and weight. Her weight is relatively steady over the last 8 months oscillating between 258 and 266 pounds.  Device interrogation shows virtually 100% biventricular pacing. There has been no atrial fibrillation and no ventricular tachycardia since last device check. Lead parameters remain within desirable range, we exception of a relatively high left ventricular pacing threshold that is not much changed from last device check, but shows a slow steady increase since device implantation. There is no evidence of fluid overload by  corvue. Submitted battery longevity is about another 2 years. She does not require atrial pacing. There have been 2 episodes of mode switch, too brief to provide electrograms.  She describes regular menstrual periods, which have recurred monthly, are painful and last for about 2 weeks. He has discussed surgical treatment with her gynecologist, but is reluctant to have any seizures. She has not had any serious bleeding problems.  Had labs performed at her physical in May, will have labs coming up with her diabetes specialist later this month.  Past Medical History:  Diagnosis Date  . Angina   . Automatic implantable cardioverter-defibrillator in situ   . Breast cancer ([place])   . Cardiomyopathy secondary to chemotherapy [facility])    a.  doxurubicin;  b. s/p SJM Quadra Assura CRT-D, model [id number], Ser# [id number]  . CHF (congestive heart failure) ([place])   . Chronic systolic heart failure ([place])   . Coronary artery disease   . DM2 (diabetes mellitus, type 2) ([place])   . Goiter   . Gout   . Hyperlipidemia   . Hypertension   . Invasive ductal carcinoma of breast ([place])   . LBBB (left bundle branch block)    evidnece of RBBB fall 2012  . 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 Pacemaker   . PONV (postoperative nausea and vomiting)    nausea post ICD placement relieved with Zofran  . Shortness of breath   . Syncope     Past Surgical History:  Procedure Laterality Date  . BI-VENTRICULAR IMPLANTABLE CARDIOVERTER DEFIBRILLATOR N/A 12/12/2011   Procedure: BI-VENTRICULAR IMPLANTABLE CARDIOVERTER DEFIBRILLATOR  (CRT-D);  Surgeon: Remo Lipps  Peterson Lombard, MD;  Location: [facility] CATH LAB;  Service: Cardiovascular;  Laterality: N/A;  . BREAST SURGERY     Left mastectomy  . CARDIAC DEFIBRILLATOR PLACEMENT     [employer]  . [place]  . DILATION AND CURETTAGE OF UTERUS  1975  . FOOT SURGERY  1994  . MASTECTOMY     right  . NM MYOCAR PERF WALL MOTION  12/01/2011   no  ischemia; EF 22%  . [place]  . TUNNELED VENOUS CATHETER PLACEMENT     removed    Outpatient Medications Prior to Visit  Medication Sig Dispense Refill  . allopurinol (ZYLOPRIM) 300 MG tablet Take 450 mg once a day    . colchicine 0.6 MG tablet Take 0.6 mg by mouth daily as needed (for pain).     . COREG CR 80 MG 24 hr capsule TAKE ONE CAPSULE BY MOUTH ONCE DAILY 90 capsule 1  . Insulin Lispro, Human, (HUMALOG [place]) Inject 100-150 Units into the skin daily.     . magnesium oxide (MAG-OX) 400 MG tablet Take 1-2 tablets (400-800 mg total) by mouth daily. Take 800 mg (2 tablets) alternating with 400 mg (1 tablet) daily 135 tablet 3  . metolazone (ZAROXOLYN) 5 MG tablet TAKE ONE TABLET BY MOUTH ONCE DAILY AS NEEDED (TAKE  IF  WEIGHT  IS  OVER  255  LBS.). 90 tablet 0  . potassium chloride SA (K-DUR,KLOR-CON) 20 MEQ tablet Take 20-40 mEq by mouth daily. Take 40 meq (2 tablets) daily alternating 20 meq (1 tablet) daily    . rivaroxaban (XARELTO) 20 MG TABS tablet Take 1 tablet (20 mg total) by mouth daily with supper. 90 tablet 3  . spironolactone (ALDACTONE) 50 MG tablet TAKE ONE TABLET BY MOUTH ONCE DAILY 90 tablet 1  . torsemide (DEMADEX) 100 MG tablet Take 50 mg by mouth 2 (two) times daily.    . vitamin B-12 (CYANOCOBALAMIN) 500 MCG tablet Take 500 mcg by mouth daily as needed.      No facility-administered medications prior to visit.      Allergies:   Lasix [furosemide]; Ace inhibitors; Codeine; Digitalis; Levaquin [levofloxacin in d5w]; Other; Statins; and Zyrtec [cetirizine]   Social History   Social History  . Marital status: Divorced    Spouse name: N/A  . Number of children: N/A  . Years of education: N/A   Social History Main Topics  . Smoking status: Former Research scientist (life sciences)  . Smokeless tobacco: Never Used  . Alcohol use No  . Drug use: No  . Sexual activity: Not Currently   Other Topics Concern  . None   Social History Narrative  . None     Family History:   The patient's family history includes Diabetes in her brother; Heart attack in her father; Hypertension in her brother.   ROS:   Please see the history of present illness.    ROS All other systems reviewed and are negative.   PHYSICAL EXAM:   VS:  BP 122/73   Pulse 80   Ht 5\' 8"  (1.727 m)   Wt 263 lb (119.3 kg)   BMI 39.99 kg/m    GEN: Well nourished, well developed, in no acute distress. Wheezy cough. No acute distress. Morbid obesity limits her physical exam.  HEENT: normal  Neck: no JVD, carotid bruits, or masses, healthy left subclavian device site Cardiac: RRR; no murmurs, rubs, or gallops,no edema  Respiratory:  clear to auscultation bilaterally, normal  work of breathing GI: soft, nontender, nondistended, + BS MS: no deformity or atrophy  Skin: warm and dry, no rash Neuro:  Alert and Oriented x 3, Strength and sensation are intact Psych: euthymic mood, full affect  Wt Readings from Last 3 Encounters:  02/16/16 263 lb (119.3 kg)  11/20/15 258 lb 6.4 oz (117.2 kg)  07/28/15 266 lb (120.7 kg)      Studies/Labs Reviewed:   EKG:  EKG is ordered today.  The ekg ordered today demonstrates atrial sensed, biventricular paced rhythm with R wave in lead V1- V2. QTC 560 ms (paced)  Recent Labs: No results found for requested labs within last 8760 hours.   Lipid Panel No results found for: CHOL, TRIG, HDL, CHOLHDL, VLDL, LDLCALC, LDLDIRECT  Additional studies/ records that were reviewed today include:  Heart failure clinic, May    ASSESSMENT:    1. Acute on chronic combined systolic and diastolic congestive heart failure, NYHA class 4 (HCC)   2. Paroxysmal atrial fibrillation ([place])   3. Cardiomyopathy secondary to chemotherapy ([place])      PLAN:  In order of problems listed above:  1. CHF: NYHA functional class II-III, clinically euvolemic. Euvolemic supported by normal thoracic impedance trend. I don't think her respiratory symptoms are due to heart  failure. 2. Nonischemic (anthracycline related) cardiomyopathy, severely depressed LVEF 20-25 percent, not much change recently 3. LBBB: Substantial symptom improvement after implementation of CRT, despite absence of significant increase in LVEF 4. Atrial fibrillation: none since last device check. On appropriate anticoagulation. She has several "nuisance" bleeding complications, but no serious events. Encouraged her to continue gynecological follow-up to make sure that she does not have pathological uterine bleeding. At this point anticoagulation benefit outweighs the risks. 5. VT/TdP: Previous events occurred in the setting of heart failure exacerbation and electrolyte imbalance. Appropriate defibrillator discharges roughly one year ago. 6. Normal CRT-D device function. Remote downloads every 3 months. 7. HTN: Normal blood pressure on current regimen. She is on maximum usual dose of carvedilol. Intolerance to ace inhibitors and angiotensin receptor blockers due to cough. 8. Morbid obesity: BMI at 40. She has had tremendous swings in her weight over the last few years, usually related to swings in mood 9. DM: On high doses of insulin, consistent with insulin resistance    Medication Adjustments/Labs and Tests Ordered: Current medicines are reviewed at length with the patient today.  Concerns regarding medicines are outlined above.  Medication changes, Labs and Tests ordered today are listed in the Patient Instructions below. Patient Instructions  Dr Sallyanne Kuster recommends that you schedule a follow-up appointment in 6 months with a device check. You will receive a reminder letter in the mail two months in advance. If you don't receive a letter, please call our office to schedule the follow-up appointment.  If you need a refill on your cardiac medications before your next appointment, please call your pharmacy.      Signed, Sanda Klein, MD  02/16/2016 9:36 AM    [facility] [place] [address], [place], [place]  [address] Phone: [phone]; Fax: [phone]

## 2016-02-22 ENCOUNTER — Encounter: Payer: Self-pay | Admitting: Internal Medicine

## 2016-03-01 DIAGNOSIS — E785 Hyperlipidemia, unspecified: Secondary | ICD-10-CM | POA: Diagnosis not present

## 2016-03-01 DIAGNOSIS — I10 Essential (primary) hypertension: Secondary | ICD-10-CM | POA: Diagnosis not present

## 2016-03-01 DIAGNOSIS — E1165 Type 2 diabetes mellitus with hyperglycemia: Secondary | ICD-10-CM | POA: Diagnosis not present

## 2016-03-08 DIAGNOSIS — I10 Essential (primary) hypertension: Secondary | ICD-10-CM | POA: Diagnosis not present

## 2016-03-08 DIAGNOSIS — E119 Type 2 diabetes mellitus without complications: Secondary | ICD-10-CM | POA: Diagnosis not present

## 2016-04-15 DIAGNOSIS — M1A09X0 Idiopathic chronic gout, multiple sites, without tophus (tophi): Secondary | ICD-10-CM | POA: Diagnosis not present

## 2016-04-15 DIAGNOSIS — M2550 Pain in unspecified joint: Secondary | ICD-10-CM | POA: Diagnosis not present

## 2016-04-15 DIAGNOSIS — N183 Chronic kidney disease, stage 3 (moderate): Secondary | ICD-10-CM | POA: Diagnosis not present

## 2016-05-09 ENCOUNTER — Other Ambulatory Visit: Payer: Self-pay | Admitting: Cardiovascular Disease

## 2016-05-09 NOTE — Telephone Encounter (Signed)
Rx(s) sent to pharmacy electronically.  

## 2016-05-17 ENCOUNTER — Ambulatory Visit (INDEPENDENT_AMBULATORY_CARE_PROVIDER_SITE_OTHER): Payer: Medicare Other | Admitting: *Deleted

## 2016-05-17 DIAGNOSIS — I427 Cardiomyopathy due to drug and external agent: Secondary | ICD-10-CM | POA: Diagnosis not present

## 2016-05-17 DIAGNOSIS — I5022 Chronic systolic (congestive) heart failure: Secondary | ICD-10-CM

## 2016-05-17 DIAGNOSIS — T451X5A Adverse effect of antineoplastic and immunosuppressive drugs, initial encounter: Secondary | ICD-10-CM | POA: Diagnosis not present

## 2016-05-17 LAB — CUP PACEART REMOTE DEVICE CHECK
Battery Remaining Longevity: 19 mo
Brady Statistic RA Percent Paced: 1 % — CL
Date Time Interrogation Session: 20171205092716
HighPow Impedance: 100 Ohm
Implantable Lead Implant Date: 20130603
Implantable Lead Implant Date: 20130603
Implantable Lead Location: 753858
Implantable Pulse Generator Implant Date: 20130606
Lead Channel Impedance Value: 580 Ohm
Lead Channel Pacing Threshold Amplitude: 1 V
Lead Channel Pacing Threshold Pulse Width: 0.5 ms
Lead Channel Pacing Threshold Pulse Width: 0.8 ms
Lead Channel Sensing Intrinsic Amplitude: 12 mV
Lead Channel Sensing Intrinsic Amplitude: 2.7 mV
MDC IDC LEAD IMPLANT DT: 20130603
MDC IDC LEAD LOCATION: 753859
MDC IDC LEAD LOCATION: 753860
MDC IDC MSMT LEADCHNL LV IMPEDANCE VALUE: 730 Ohm
MDC IDC MSMT LEADCHNL LV PACING THRESHOLD AMPLITUDE: 2.625 V
MDC IDC MSMT LEADCHNL RA IMPEDANCE VALUE: 460 Ohm
MDC IDC MSMT LEADCHNL RV PACING THRESHOLD AMPLITUDE: 0.75 V
MDC IDC MSMT LEADCHNL RV PACING THRESHOLD PULSEWIDTH: 0.5 ms
MDC IDC PG SERIAL: 7036093
MDC IDC STAT BRADY RV PERCENT PACED: 99 %

## 2016-05-17 NOTE — Progress Notes (Signed)
Remote ICD transmission.   

## 2016-05-18 ENCOUNTER — Encounter: Payer: Self-pay | Admitting: Cardiology

## 2016-06-01 DIAGNOSIS — M1A09X0 Idiopathic chronic gout, multiple sites, without tophus (tophi): Secondary | ICD-10-CM | POA: Diagnosis not present

## 2016-06-01 DIAGNOSIS — E119 Type 2 diabetes mellitus without complications: Secondary | ICD-10-CM | POA: Diagnosis not present

## 2016-06-07 ENCOUNTER — Other Ambulatory Visit: Payer: Self-pay | Admitting: Cardiovascular Disease

## 2016-06-08 ENCOUNTER — Other Ambulatory Visit: Payer: Self-pay | Admitting: Cardiovascular Disease

## 2016-06-09 DIAGNOSIS — E119 Type 2 diabetes mellitus without complications: Secondary | ICD-10-CM | POA: Diagnosis not present

## 2016-06-09 DIAGNOSIS — E785 Hyperlipidemia, unspecified: Secondary | ICD-10-CM | POA: Diagnosis not present

## 2016-06-09 DIAGNOSIS — I4891 Unspecified atrial fibrillation: Secondary | ICD-10-CM | POA: Diagnosis not present

## 2016-06-09 DIAGNOSIS — I10 Essential (primary) hypertension: Secondary | ICD-10-CM | POA: Diagnosis not present

## 2016-06-15 ENCOUNTER — Other Ambulatory Visit: Payer: Self-pay | Admitting: Cardiovascular Disease

## 2016-06-16 DIAGNOSIS — M216X1 Other acquired deformities of right foot: Secondary | ICD-10-CM | POA: Diagnosis not present

## 2016-06-16 DIAGNOSIS — L602 Onychogryphosis: Secondary | ICD-10-CM | POA: Diagnosis not present

## 2016-06-16 DIAGNOSIS — M216X2 Other acquired deformities of left foot: Secondary | ICD-10-CM | POA: Diagnosis not present

## 2016-06-16 DIAGNOSIS — E1151 Type 2 diabetes mellitus with diabetic peripheral angiopathy without gangrene: Secondary | ICD-10-CM | POA: Diagnosis not present

## 2016-08-10 ENCOUNTER — Encounter: Payer: Self-pay | Admitting: Cardiovascular Disease

## 2016-08-10 ENCOUNTER — Ambulatory Visit (INDEPENDENT_AMBULATORY_CARE_PROVIDER_SITE_OTHER): Payer: Medicare Other | Admitting: Cardiovascular Disease

## 2016-08-10 ENCOUNTER — Encounter (INDEPENDENT_AMBULATORY_CARE_PROVIDER_SITE_OTHER): Payer: Self-pay

## 2016-08-10 VITALS — BP 126/80 | HR 75 | Ht 68.0 in | Wt 264.0 lb

## 2016-08-10 DIAGNOSIS — I10 Essential (primary) hypertension: Secondary | ICD-10-CM

## 2016-08-10 DIAGNOSIS — I480 Paroxysmal atrial fibrillation: Secondary | ICD-10-CM | POA: Diagnosis not present

## 2016-08-10 DIAGNOSIS — E782 Mixed hyperlipidemia: Secondary | ICD-10-CM

## 2016-08-10 DIAGNOSIS — Z794 Long term (current) use of insulin: Secondary | ICD-10-CM

## 2016-08-10 DIAGNOSIS — I5022 Chronic systolic (congestive) heart failure: Secondary | ICD-10-CM

## 2016-08-10 DIAGNOSIS — E119 Type 2 diabetes mellitus without complications: Secondary | ICD-10-CM

## 2016-08-10 DIAGNOSIS — I427 Cardiomyopathy due to drug and external agent: Secondary | ICD-10-CM

## 2016-08-10 DIAGNOSIS — I472 Ventricular tachycardia: Secondary | ICD-10-CM

## 2016-08-10 DIAGNOSIS — M1A29X0 Drug-induced chronic gout, multiple sites, without tophus (tophi): Secondary | ICD-10-CM

## 2016-08-10 DIAGNOSIS — Z95810 Presence of automatic (implantable) cardiac defibrillator: Secondary | ICD-10-CM

## 2016-08-10 DIAGNOSIS — I4729 Other ventricular tachycardia: Secondary | ICD-10-CM

## 2016-08-10 DIAGNOSIS — T451X5A Adverse effect of antineoplastic and immunosuppressive drugs, initial encounter: Secondary | ICD-10-CM

## 2016-08-10 DIAGNOSIS — I447 Left bundle-branch block, unspecified: Secondary | ICD-10-CM

## 2016-08-10 MED ORDER — CARVEDILOL 25 MG PO TABS: 25.0000 mg | ORAL_TABLET | Freq: Two times a day (BID) | ORAL | 3 refills | Status: DC

## 2016-08-10 NOTE — Patient Instructions (Addendum)
Dr Sallyanne Kuster has recommended making the following medication changes: 1. STOP Coreg CR 2. START Carvedilol 25 mg - take 1 tablet by mouth twice daily 3. STOP Xarelto  Remote monitoring is used to monitor your Pacemaker of ICD from home. This monitoring reduces the number of office visits required to check your device to one time per year. It allows [place] to keep an eye on the functioning of your device to ensure it is working properly. You are scheduled for a device check from home on Wednesday, May 2nd, 2018. You may send your transmission at any time that day. If you have a wireless device, the transmission will be sent automatically. After your physician reviews your transmission, you will receive a postcard with your next transmission date.  Dr Sallyanne Kuster recommends that you schedule a follow-up appointment in 12 months with a device check. You will receive a reminder letter in the mail two months in advance. If you don't receive a letter, please call our office to schedule the follow-up appointment.  If you need a refill on your cardiac medications before your next appointment, please call your pharmacy.

## 2016-08-10 NOTE — Progress Notes (Signed)
Patient ID: Sara Walker, female   DOB: 1951/02/26, 66 y.o.   MRN: [id number]     Cardiology Office Note    Date:  08/11/2016   ID:  [name], DOB 1950-11-04, MRN [id number]  PCP:  Horatio Pel, MD  Cardiologist:   Sanda Klein, MD   Chief Complaint  Patient presents with  . Follow-up  . Chest Pain  . Shortness of Breath  . Headache  . Edema    History of Present Illness:  Sara Walker is a 66 y.o. female who presents for severe nonischemic cardiomyopathy and advanced (stage D) combined systolic and diastolic heart failure, s/p CRT-D, history of paroxysmal atrial fibrillation and polymorphic VT requiring ICD shocks (in the setting of hypokalemia). She has a left bundle branch block and showed substantial improvement in clinical status after cardiac resynchronization therapy. However, LVEF remains 20-25 %. Roughly one year ago her pulmonary exercise testing showed a submaximal response, but suggested that restrictive ventilatory defect (related to obesity) may be the biggest limiting factor. She has been intolerant of ace inhibitors, ARB's and Entresto.  She has generally done well in the recent months without episodes of heart failure exacerbation or need for change in her diuretic dosage. Edema is rarely a problem. She takes an extra metolazone dose with her Lasix no more than once every 2 weeks or so. Her weight remains steady between 258 and 266 pounds over the last year. She denies orthopnea, PND, wheezing or chest pain.  However, she is having a lot of financial difficulties. She insists that she will stop her expensive medications. She is unwilling to continue taking Xarelto and does not want to go back to warfarin and prothrombin time checks. She would also like to switch from Corex CR to generic carvedilol, although she recalls having some difficulty with a generic product when she first tried it. Finally she is switching from Humalog to a cheaper insulin for her  pump  Device interrogation shows virtually 100% biventricular pacing. There is less than 1% atrial pacing. There has been no atrial fibrillation and no ventricular tachycardia since last device check. There have been 2 episodes of mode switch, too brief to provide electrograms. Lead parameters remain within desirable range, with the exception of a relatively high left ventricular pacing threshold that is not much changed from last device check, but shows a slow steady increase since device implantation. Currently at 3.125 V at 0.8 ms pulse width in the M3-RV coil configuration.   There there was modest evidence of fluid overload by corvue around the Christmas and new year holidays, now reverting to baseline. Estimated battery longevity is about another 1.5 years.    Past Medical History:  Diagnosis Date  . Angina   . Automatic implantable cardioverter-defibrillator in situ   . Breast cancer ([place])   . Cardiomyopathy secondary to chemotherapy [facility])    a.  doxurubicin;  b. s/p SJM Quadra Assura CRT-D, model [id number], Ser# [id number]  . CHF (congestive heart failure) ([place])   . Chronic systolic heart failure ([place])   . Coronary artery disease   . DM2 (diabetes mellitus, type 2) ([place])   . Goiter   . Gout   . Hyperlipidemia   . Hypertension   . Invasive ductal carcinoma of breast ([place])   . LBBB (left bundle branch block)    evidnece of RBBB fall 2012  . 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 Pacemaker   . PONV (postoperative nausea and  vomiting)    nausea post ICD placement relieved with Zofran  . Shortness of breath   . Syncope     Past Surgical History:  Procedure Laterality Date  . BI-VENTRICULAR IMPLANTABLE CARDIOVERTER DEFIBRILLATOR N/A 12/12/2011   Procedure: BI-VENTRICULAR IMPLANTABLE CARDIOVERTER DEFIBRILLATOR  (CRT-D);  Surgeon: Deboraha Sprang, MD;  Location: [facility] CATH LAB;  Service: Cardiovascular;  Laterality: N/A;  . BREAST SURGERY     Left mastectomy  .  CARDIAC DEFIBRILLATOR PLACEMENT     [employer] Quadra  . [place]  . DILATION AND CURETTAGE OF UTERUS  1975  . FOOT SURGERY  1994  . MASTECTOMY     right  . NM MYOCAR PERF WALL MOTION  12/01/2011   no ischemia; EF 22%  . [place]  . TUNNELED VENOUS CATHETER PLACEMENT     removed    Outpatient Medications Prior to Visit  Medication Sig Dispense Refill  . allopurinol (ZYLOPRIM) 300 MG tablet Take 450 mg once a day    . colchicine 0.6 MG tablet Take 0.6 mg by mouth daily as needed (for pain).     . Insulin Lispro, Human, (HUMALOG [place]) Inject 100-150 Units into the skin daily.     Marland Kitchen KLOR-CON M20 20 MEQ tablet TAKE ONE TABLET BY MOUTH ONCE DAILY (  TAKE  AN  EXTRA  40  MEQ  WHEN  YOU  TAKE  METOLAZONE) 60 tablet 8  . magnesium oxide (MAG-OX) 400 MG tablet Take 1-2 tablets (400-800 mg total) by mouth daily. Take 800 mg (2 tablets) alternating with 400 mg (1 tablet) daily 135 tablet 3  . metolazone (ZAROXOLYN) 5 MG tablet TAKE ONE TABLET BY MOUTH ONCE DAILY AS NEEDED (TAKE  IF  WEIGHT  IS  OVER  255  LBS) 90 tablet 1  . spironolactone (ALDACTONE) 50 MG tablet TAKE ONE TABLET BY MOUTH ONCE DAILY 90 tablet 1  . torsemide (DEMADEX) 100 MG tablet TAKE ONE TABLET BY MOUTH ONCE DAILY 90 tablet 2  . vitamin B-12 (CYANOCOBALAMIN) 500 MCG tablet Take 500 mcg by mouth daily as needed.     . carvedilol (COREG CR) 80 MG 24 hr capsule Take 1 capsule (80 mg total) by mouth daily. 90 capsule 3  . rivaroxaban (XARELTO) 20 MG TABS tablet Take 1 tablet (20 mg total) by mouth daily with supper. 90 tablet 3   No facility-administered medications prior to visit.      Allergies:   Lasix [furosemide]; Ace inhibitors; Codeine; Digitalis; Levaquin [levofloxacin in d5w]; Other; Statins; and Zyrtec [cetirizine]   Social History   Social History  . Marital status: Divorced    Spouse name: N/A  . Number of children: N/A  . Years of education: N/A   Social History Main Topics  .  Smoking status: Former Research scientist (life sciences)  . Smokeless tobacco: Never Used  . Alcohol use No  . Drug use: No  . Sexual activity: Not Currently   Other Topics Concern  . None   Social History Narrative  . None     Family History:  The patient's family history includes Diabetes in her brother; Heart attack in her father; Hypertension in her brother.   ROS:   Please see the history of present illness.    ROS All other systems reviewed and are negative.   PHYSICAL EXAM:   VS:  BP 126/80   Pulse 75   Ht 5\' 8"  (1.727 m)   Wt 119.7 kg (264 lb)  BMI 40.14 kg/m    GEN: Well nourished, well developed, in no acute distress. Wheezy cough. No acute distress. Morbid obesity limits her physical exam.  HEENT: normal  Neck: no JVD, carotid bruits, or masses, healthy left subclavian device site Cardiac: RRR; no murmurs, rubs, or gallops,no edema  Respiratory:  clear to auscultation bilaterally, normal work of breathing GI: soft, nontender, nondistended, + BS MS: no deformity or atrophy  Skin: warm and dry, no rash Neuro:  Alert and Oriented x 3, Strength and sensation are intact Psych: euthymic mood, full affect  Wt Readings from Last 3 Encounters:  08/10/16 119.7 kg (264 lb)  02/16/16 119.3 kg (263 lb)  11/20/15 117.2 kg (258 lb 6.4 oz)      Studies/Labs Reviewed:   EKG:  EKG is ordered today.  The ekg ordered today demonstrates atrial sensed, biventricular paced rhythm with R wave in lead V1- V2. QTC 560 ms (paced)  Additional studies/ records that were reviewed today include:  Heart failure clinic, May  LABS  May 2017 Hemoglobin 13.6, BUN 21, creatinine 1.0, potassium 3.8, normal LFTs, uric acid 5.0 Total cholesterol 232, transfers to and 46, HDL 44, LDL 139 TSH 2.31 Hemoglobin A1c 7.4%  ASSESSMENT:    1. Paroxysmal atrial fibrillation (HCC)   2. Chronic systolic heart failure ([place])   3. Cardiomyopathy secondary to chemotherapy ([place])   4. LBBB (left bundle branch block)   5.  Ventricular tachycardia (paroxysmal) ([place])   6. Biventricular ICD (implantable cardioverter-defibrillator) in place   7. Essential hypertension   8. Morbid obesity ([place])   9. Type 2 diabetes mellitus without complication, with long-term current use of insulin ([place])   10. 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 Mixed hyperlipidemia    PLAN:  In order of problems listed above:  1. AFib: none seen in over a year. On appropriate anticoagulation, which she insists on stopping. I programmed her device to alert both the patient (vibratory tones demonstrated) and our device clinic if she has sustained atrial fibrillation. I cautioned her that if this happens I would insist that she starts taking anticoagulants again. As it is we are taking a stroke risk, since her embolic risk is quite high: CHADSVasc 5 (age, gender, DM, CHF, HTN). 2. CHF: NYHA functional class II, clinically euvolemic. Euvolemia supported by normal thoracic impedance trend. I don't think her respiratory symptoms are due to heart failure. 3. CMP: severely depressed LVEF 20-25% 4. LBBB: Substantial symptom improvement after implementation of CRT, despite absence of significant increase in LVEF 5. VT/TdP: Previous events occurred in the setting of heart failure exacerbation and electrolyte imbalance in February 2016, leading to  Two appropriate defibrillator discharges. 6. Normal CRT-D device function. Remote downloads every 3 months. 7. HTN: Normal blood pressure on current regimen. We'll switch to generic carvedilol She is on maximum usual dose of carvedilol. Intolerant to both ACE inhibitors and angiotensin receptor blockers due to cough. 8. Morbid obesity: BMI at 40. She has had tremendous swings in her weight over the last few years, usually related to swings in mood. Current weight has been stable for about a year 9. DM: On high doses of insulin, consistent with insulin resistance. 10. Gout: Now with  controlled uric acid without recent gout attacks 11. HLP: She has been intolerant to statins. She does not have coronary disease by angiography or other vascular complications, but does have diabetes mellitus. Ideally we would bring her LDL to less than 100. Given  her current financial problems she will not be receptive to PCS K9 inhibitors. Will explore options to see if we can get these to her free of charge.    Medication Adjustments/Labs and Tests Ordered: Current medicines are reviewed at length with the patient today.  Concerns regarding medicines are outlined above.  Medication changes, Labs and Tests ordered today are listed in the Patient Instructions below. Patient Instructions  Dr Sallyanne Kuster has recommended making the following medication changes: 1. STOP Coreg CR 2. START Carvedilol 25 mg - take 1 tablet by mouth twice daily 3. STOP Xarelto  Remote monitoring is used to monitor your Pacemaker of ICD from home. This monitoring reduces the number of office visits required to check your device to one time per year. It allows [place] to keep an eye on the functioning of your device to ensure it is working properly. You are scheduled for a device check from home on Wednesday, May 2nd, 2018. You may send your transmission at any time that day. If you have a wireless device, the transmission will be sent automatically. After your physician reviews your transmission, you will receive a postcard with your next transmission date.  Dr Sallyanne Kuster recommends that you schedule a follow-up appointment in 12 months with a device check. You will receive a reminder letter in the mail two months in advance. If you don't receive a letter, please call our office to schedule the follow-up appointment.  If you need a refill on your cardiac medications before your next appointment, please call your pharmacy.      Signed, Sanda Klein, MD  08/11/2016 5:33 PM    [address] [place], [place], [place]  [address] Phone: [phone]; Fax: [phone]

## 2016-08-16 ENCOUNTER — Encounter: Payer: Medicare Other | Admitting: Cardiovascular Disease

## 2016-08-22 LAB — CUP PACEART INCLINIC DEVICE CHECK
Date Time Interrogation Session: 20180212085015
Implantable Lead Implant Date: 20130603
Implantable Lead Location: 753859
Implantable Pulse Generator Implant Date: 20130606
MDC IDC LEAD IMPLANT DT: 20130603
MDC IDC LEAD IMPLANT DT: 20130603
MDC IDC LEAD LOCATION: 753858
MDC IDC LEAD LOCATION: 753860
MDC IDC PG SERIAL: 7036093

## 2016-08-23 DIAGNOSIS — E1351 Other specified diabetes mellitus with diabetic peripheral angiopathy without gangrene: Secondary | ICD-10-CM | POA: Diagnosis not present

## 2016-08-23 DIAGNOSIS — L84 Corns and callosities: Secondary | ICD-10-CM | POA: Diagnosis not present

## 2016-08-23 DIAGNOSIS — L602 Onychogryphosis: Secondary | ICD-10-CM | POA: Diagnosis not present

## 2016-09-06 DIAGNOSIS — I10 Essential (primary) hypertension: Secondary | ICD-10-CM | POA: Diagnosis not present

## 2016-09-06 DIAGNOSIS — E1165 Type 2 diabetes mellitus with hyperglycemia: Secondary | ICD-10-CM | POA: Diagnosis not present

## 2016-09-06 DIAGNOSIS — E785 Hyperlipidemia, unspecified: Secondary | ICD-10-CM | POA: Diagnosis not present

## 2016-09-07 ENCOUNTER — Other Ambulatory Visit: Payer: Self-pay | Admitting: Cardiovascular Disease

## 2016-10-13 DIAGNOSIS — Z6841 Body Mass Index (BMI) 40.0 and over, adult: Secondary | ICD-10-CM | POA: Diagnosis not present

## 2016-10-13 DIAGNOSIS — N183 Chronic kidney disease, stage 3 (moderate): Secondary | ICD-10-CM | POA: Diagnosis not present

## 2016-10-13 DIAGNOSIS — M2550 Pain in unspecified joint: Secondary | ICD-10-CM | POA: Diagnosis not present

## 2016-10-13 DIAGNOSIS — M1A09X0 Idiopathic chronic gout, multiple sites, without tophus (tophi): Secondary | ICD-10-CM | POA: Diagnosis not present

## 2016-10-24 ENCOUNTER — Other Ambulatory Visit: Payer: Self-pay | Admitting: Cardiovascular Disease

## 2016-11-01 DIAGNOSIS — L84 Corns and callosities: Secondary | ICD-10-CM | POA: Diagnosis not present

## 2016-11-01 DIAGNOSIS — E1351 Other specified diabetes mellitus with diabetic peripheral angiopathy without gangrene: Secondary | ICD-10-CM | POA: Diagnosis not present

## 2016-11-01 DIAGNOSIS — L602 Onychogryphosis: Secondary | ICD-10-CM | POA: Diagnosis not present

## 2016-11-09 ENCOUNTER — Ambulatory Visit (INDEPENDENT_AMBULATORY_CARE_PROVIDER_SITE_OTHER): Payer: Medicare Other | Admitting: *Deleted

## 2016-11-09 DIAGNOSIS — I427 Cardiomyopathy due to drug and external agent: Secondary | ICD-10-CM | POA: Diagnosis not present

## 2016-11-09 DIAGNOSIS — T451X5A Adverse effect of antineoplastic and immunosuppressive drugs, initial encounter: Secondary | ICD-10-CM | POA: Diagnosis not present

## 2016-11-09 LAB — CUP PACEART REMOTE DEVICE CHECK
Battery Remaining Longevity: 17 mo
Battery Remaining Percentage: 27 %
Brady Statistic AS VS Percent: 1 %
Brady Statistic RA Percent Paced: 1 %
Date Time Interrogation Session: 20180502092626
HIGH POWER IMPEDANCE MEASURED VALUE: 81 Ohm
HighPow Impedance: 81 Ohm
Implantable Lead Implant Date: 20130603
Implantable Lead Implant Date: 20130603
Implantable Lead Location: 753859
Implantable Lead Location: 753860
Implantable Pulse Generator Implant Date: 20130606
Lead Channel Impedance Value: 550 Ohm
Lead Channel Impedance Value: 750 Ohm
Lead Channel Pacing Threshold Amplitude: 0.75 V
Lead Channel Pacing Threshold Amplitude: 3 V
Lead Channel Pacing Threshold Pulse Width: 0.5 ms
Lead Channel Pacing Threshold Pulse Width: 0.8 ms
Lead Channel Sensing Intrinsic Amplitude: 12 mV
Lead Channel Sensing Intrinsic Amplitude: 2.6 mV
Lead Channel Setting Pacing Amplitude: 1.75 V
Lead Channel Setting Pacing Amplitude: 4 V
Lead Channel Setting Sensing Sensitivity: 0.5 mV
MDC IDC LEAD IMPLANT DT: 20130603
MDC IDC LEAD LOCATION: 753858
MDC IDC MSMT BATTERY VOLTAGE: 2.83 V
MDC IDC MSMT LEADCHNL RA IMPEDANCE VALUE: 340 Ohm
MDC IDC MSMT LEADCHNL RA PACING THRESHOLD AMPLITUDE: 0.75 V
MDC IDC MSMT LEADCHNL RV PACING THRESHOLD PULSEWIDTH: 0.5 ms
MDC IDC PG SERIAL: 7036093
MDC IDC SET LEADCHNL LV PACING PULSEWIDTH: 0.8 ms
MDC IDC SET LEADCHNL RV PACING AMPLITUDE: 2 V
MDC IDC SET LEADCHNL RV PACING PULSEWIDTH: 0.5 ms
MDC IDC STAT BRADY AP VP PERCENT: 1 %
MDC IDC STAT BRADY AP VS PERCENT: 1 %
MDC IDC STAT BRADY AS VP PERCENT: 99 %

## 2016-11-09 NOTE — Progress Notes (Signed)
Remote ICD transmission.   

## 2016-11-10 ENCOUNTER — Encounter: Payer: Self-pay | Admitting: Cardiology

## 2016-11-15 ENCOUNTER — Telehealth: Payer: Self-pay

## 2016-11-15 NOTE — Telephone Encounter (Signed)
-----   Message from Sanda Klein, MD sent at 11/13/2016  3:46 PM EDT ----- Remote reviewed.   Not pacemaker dependent. Battery status is good. Lead measurements are stable. Heart rate histogram is favorable. No clinically significant episodes of high ventricular rate or atrial mode switch noted.  Suggests fluid accumulation. [place], can you please call and ask about symptoms/weight gain/medication compliance?

## 2016-11-15 NOTE — Telephone Encounter (Signed)
lmtcb

## 2016-11-17 NOTE — Telephone Encounter (Signed)
Follow up    Pt is calling returning call to RN about results.

## 2016-11-18 NOTE — Telephone Encounter (Signed)
Follow up ° ° ° ° ° °Returning a call to the nurse °

## 2016-11-18 NOTE — Telephone Encounter (Signed)
Discussed results summary of remote pacer dl w patient.  Also inquired about fluid status issues. Pt voices no concerns - states she is doing fine and notes no symptoms or weight/fluid gain. Compliant w current drug regimen.  She voiced thanks for call and is aware to call if any issues - she will plan to submit repeat remote dl on august 1st.  Routed to Dr. Sallyanne Kuster for fyi/any further recommendations.

## 2016-12-01 DIAGNOSIS — E1165 Type 2 diabetes mellitus with hyperglycemia: Secondary | ICD-10-CM | POA: Diagnosis not present

## 2016-12-01 DIAGNOSIS — E785 Hyperlipidemia, unspecified: Secondary | ICD-10-CM | POA: Diagnosis not present

## 2016-12-01 DIAGNOSIS — I10 Essential (primary) hypertension: Secondary | ICD-10-CM | POA: Diagnosis not present

## 2016-12-06 DIAGNOSIS — E559 Vitamin D deficiency, unspecified: Secondary | ICD-10-CM | POA: Diagnosis not present

## 2016-12-06 DIAGNOSIS — E1169 Type 2 diabetes mellitus with other specified complication: Secondary | ICD-10-CM | POA: Diagnosis not present

## 2016-12-06 DIAGNOSIS — I10 Essential (primary) hypertension: Secondary | ICD-10-CM | POA: Diagnosis not present

## 2016-12-06 DIAGNOSIS — E785 Hyperlipidemia, unspecified: Secondary | ICD-10-CM | POA: Diagnosis not present

## 2016-12-06 DIAGNOSIS — N390 Urinary tract infection, site not specified: Secondary | ICD-10-CM | POA: Diagnosis not present

## 2016-12-09 DIAGNOSIS — E049 Nontoxic goiter, unspecified: Secondary | ICD-10-CM | POA: Diagnosis not present

## 2016-12-09 DIAGNOSIS — R600 Localized edema: Secondary | ICD-10-CM | POA: Diagnosis not present

## 2016-12-09 DIAGNOSIS — E1149 Type 2 diabetes mellitus with other diabetic neurological complication: Secondary | ICD-10-CM | POA: Diagnosis not present

## 2016-12-09 DIAGNOSIS — M545 Low back pain: Secondary | ICD-10-CM | POA: Diagnosis not present

## 2017-01-23 ENCOUNTER — Other Ambulatory Visit: Payer: Self-pay | Admitting: Cardiovascular Disease

## 2017-02-08 ENCOUNTER — Ambulatory Visit (INDEPENDENT_AMBULATORY_CARE_PROVIDER_SITE_OTHER): Payer: Medicare Other | Admitting: *Deleted

## 2017-02-08 DIAGNOSIS — I427 Cardiomyopathy due to drug and external agent: Secondary | ICD-10-CM

## 2017-02-08 DIAGNOSIS — T451X5A Adverse effect of antineoplastic and immunosuppressive drugs, initial encounter: Secondary | ICD-10-CM

## 2017-02-08 NOTE — Progress Notes (Signed)
Remote ICD transmission.   

## 2017-02-09 ENCOUNTER — Encounter: Payer: Self-pay | Admitting: Cardiology

## 2017-02-21 DIAGNOSIS — E1165 Type 2 diabetes mellitus with hyperglycemia: Secondary | ICD-10-CM | POA: Diagnosis not present

## 2017-02-23 DIAGNOSIS — E785 Hyperlipidemia, unspecified: Secondary | ICD-10-CM | POA: Diagnosis not present

## 2017-02-23 DIAGNOSIS — E119 Type 2 diabetes mellitus without complications: Secondary | ICD-10-CM | POA: Diagnosis not present

## 2017-02-23 DIAGNOSIS — I10 Essential (primary) hypertension: Secondary | ICD-10-CM | POA: Diagnosis not present

## 2017-02-27 DIAGNOSIS — I10 Essential (primary) hypertension: Secondary | ICD-10-CM | POA: Diagnosis not present

## 2017-02-27 DIAGNOSIS — E785 Hyperlipidemia, unspecified: Secondary | ICD-10-CM | POA: Diagnosis not present

## 2017-02-27 DIAGNOSIS — E119 Type 2 diabetes mellitus without complications: Secondary | ICD-10-CM | POA: Diagnosis not present

## 2017-03-17 LAB — CUP PACEART REMOTE DEVICE CHECK
Battery Remaining Percentage: 24 %
Brady Statistic RA Percent Paced: 1 % — CL
Date Time Interrogation Session: 20180907132844
HIGH POWER IMPEDANCE MEASURED VALUE: 88 Ohm
Implantable Lead Implant Date: 20130603
Implantable Lead Implant Date: 20130603
Implantable Lead Location: 753859
Lead Channel Impedance Value: 350 Ohm
Lead Channel Impedance Value: 550 Ohm
Lead Channel Pacing Threshold Amplitude: 0.625 V
Lead Channel Pacing Threshold Amplitude: 3.375 V
Lead Channel Pacing Threshold Pulse Width: 0.5 ms
Lead Channel Pacing Threshold Pulse Width: 0.5 ms
Lead Channel Pacing Threshold Pulse Width: 0.8 ms
Lead Channel Setting Pacing Amplitude: 2 V
Lead Channel Setting Pacing Pulse Width: 0.8 ms
Lead Channel Setting Sensing Sensitivity: 0.5 mV
MDC IDC LEAD IMPLANT DT: 20130603
MDC IDC LEAD LOCATION: 753858
MDC IDC LEAD LOCATION: 753860
MDC IDC MSMT BATTERY REMAINING LONGEVITY: 14 mo
MDC IDC MSMT LEADCHNL LV IMPEDANCE VALUE: 740 Ohm
MDC IDC MSMT LEADCHNL RA PACING THRESHOLD AMPLITUDE: 0.75 V
MDC IDC MSMT LEADCHNL RA SENSING INTR AMPL: 2.8 mV
MDC IDC MSMT LEADCHNL RV SENSING INTR AMPL: 12 mV
MDC IDC PG IMPLANT DT: 20130606
MDC IDC SET LEADCHNL LV PACING AMPLITUDE: 4.375
MDC IDC SET LEADCHNL RA PACING AMPLITUDE: 1.75 V
MDC IDC SET LEADCHNL RV PACING PULSEWIDTH: 0.5 ms
MDC IDC STAT BRADY RV PERCENT PACED: 99 % — AB
Pulse Gen Serial Number: 7036093

## 2017-04-12 ENCOUNTER — Other Ambulatory Visit: Payer: Self-pay | Admitting: Cardiovascular Disease

## 2017-04-19 ENCOUNTER — Telehealth: Payer: Self-pay | Admitting: Cardiovascular Disease

## 2017-04-19 NOTE — Telephone Encounter (Signed)
Spoke with pt and gave number to [facility]. Tour manager services informed pt to call at 8am Thursday and if she is unable to get through then try Friday and if she is unable to get through to call device clinic back to bring her in for a clinic appoinment to troubleshoot home monitor. Pt voiced understanding.

## 2017-04-19 NOTE — Telephone Encounter (Signed)
New message    Pt is calling stating the light on her home machine went out. She said that she has called the company and can not get through. What should she do?

## 2017-05-10 ENCOUNTER — Ambulatory Visit (INDEPENDENT_AMBULATORY_CARE_PROVIDER_SITE_OTHER): Payer: Medicare Other | Admitting: *Deleted

## 2017-05-10 DIAGNOSIS — T451X5A Adverse effect of antineoplastic and immunosuppressive drugs, initial encounter: Secondary | ICD-10-CM | POA: Diagnosis not present

## 2017-05-10 DIAGNOSIS — I427 Cardiomyopathy due to drug and external agent: Secondary | ICD-10-CM | POA: Diagnosis not present

## 2017-05-11 NOTE — Progress Notes (Signed)
Remote ICD transmission.   

## 2017-05-12 ENCOUNTER — Encounter: Payer: Self-pay | Admitting: Cardiology

## 2017-05-12 NOTE — Progress Notes (Signed)
Letter  

## 2017-05-16 ENCOUNTER — Other Ambulatory Visit: Payer: Self-pay | Admitting: Cardiovascular Disease

## 2017-05-16 NOTE — Telephone Encounter (Signed)
REFILL 

## 2017-05-18 DIAGNOSIS — E1165 Type 2 diabetes mellitus with hyperglycemia: Secondary | ICD-10-CM | POA: Diagnosis not present

## 2017-05-29 DIAGNOSIS — I10 Essential (primary) hypertension: Secondary | ICD-10-CM | POA: Diagnosis not present

## 2017-05-29 DIAGNOSIS — E1149 Type 2 diabetes mellitus with other diabetic neurological complication: Secondary | ICD-10-CM | POA: Diagnosis not present

## 2017-06-06 LAB — CUP PACEART REMOTE DEVICE CHECK
Battery Remaining Longevity: 11 mo
Battery Remaining Percentage: 18 %
Brady Statistic RA Percent Paced: 1 % — CL
HIGH POWER IMPEDANCE MEASURED VALUE: 84 Ohm
Implantable Lead Implant Date: 20130603
Implantable Lead Implant Date: 20130603
Implantable Lead Location: 753860
Lead Channel Impedance Value: 340 Ohm
Lead Channel Impedance Value: 800 Ohm
Lead Channel Pacing Threshold Amplitude: 0.75 V
Lead Channel Pacing Threshold Amplitude: 3 V
Lead Channel Pacing Threshold Pulse Width: 0.5 ms
Lead Channel Pacing Threshold Pulse Width: 0.5 ms
Lead Channel Pacing Threshold Pulse Width: 0.8 ms
Lead Channel Sensing Intrinsic Amplitude: 2.7 mV
Lead Channel Setting Sensing Sensitivity: 0.5 mV
MDC IDC LEAD IMPLANT DT: 20130603
MDC IDC LEAD LOCATION: 753858
MDC IDC LEAD LOCATION: 753859
MDC IDC MSMT LEADCHNL RV IMPEDANCE VALUE: 550 Ohm
MDC IDC MSMT LEADCHNL RV PACING THRESHOLD AMPLITUDE: 0.625 V
MDC IDC MSMT LEADCHNL RV SENSING INTR AMPL: 12 mV
MDC IDC PG IMPLANT DT: 20130606
MDC IDC PG SERIAL: 7036093
MDC IDC SESS DTM: 20181127140202
MDC IDC SET LEADCHNL LV PACING AMPLITUDE: 4 V
MDC IDC SET LEADCHNL LV PACING PULSEWIDTH: 0.8 ms
MDC IDC SET LEADCHNL RA PACING AMPLITUDE: 1.75 V
MDC IDC SET LEADCHNL RV PACING AMPLITUDE: 2 V
MDC IDC SET LEADCHNL RV PACING PULSEWIDTH: 0.5 ms
MDC IDC STAT BRADY RV PERCENT PACED: 99 % — AB

## 2017-06-08 DIAGNOSIS — Z853 Personal history of malignant neoplasm of breast: Secondary | ICD-10-CM | POA: Diagnosis not present

## 2017-06-08 DIAGNOSIS — E119 Type 2 diabetes mellitus without complications: Secondary | ICD-10-CM | POA: Diagnosis not present

## 2017-06-08 DIAGNOSIS — J45909 Unspecified asthma, uncomplicated: Secondary | ICD-10-CM | POA: Diagnosis not present

## 2017-06-08 DIAGNOSIS — I10 Essential (primary) hypertension: Secondary | ICD-10-CM | POA: Diagnosis not present

## 2017-06-08 DIAGNOSIS — E785 Hyperlipidemia, unspecified: Secondary | ICD-10-CM | POA: Diagnosis not present

## 2017-06-08 DIAGNOSIS — N183 Chronic kidney disease, stage 3 (moderate): Secondary | ICD-10-CM | POA: Diagnosis not present

## 2017-06-08 DIAGNOSIS — E1149 Type 2 diabetes mellitus with other diabetic neurological complication: Secondary | ICD-10-CM | POA: Diagnosis not present

## 2017-06-08 DIAGNOSIS — I4891 Unspecified atrial fibrillation: Secondary | ICD-10-CM | POA: Diagnosis not present

## 2017-07-12 ENCOUNTER — Other Ambulatory Visit: Payer: Self-pay | Admitting: Cardiovascular Disease

## 2017-07-12 NOTE — Telephone Encounter (Signed)
Rx has been sent to the pharmacy electronically. ° °

## 2017-07-13 ENCOUNTER — Other Ambulatory Visit: Payer: Self-pay | Admitting: Cardiovascular Disease

## 2017-08-08 ENCOUNTER — Telehealth: Payer: Self-pay | Admitting: Cardiovascular Disease

## 2017-08-08 MED ORDER — CARVEDILOL 25 MG PO TABS: 25.0000 mg | ORAL_TABLET | Freq: Two times a day (BID) | ORAL | 0 refills | Status: DC

## 2017-08-08 NOTE — Telephone Encounter (Signed)
Pt called for a refill of CARVEDILOL 25 mg

## 2017-08-08 NOTE — Telephone Encounter (Signed)
Refill sent to the pharmacy electronically.  

## 2017-08-09 ENCOUNTER — Ambulatory Visit (INDEPENDENT_AMBULATORY_CARE_PROVIDER_SITE_OTHER): Payer: Medicare Other | Admitting: *Deleted

## 2017-08-09 ENCOUNTER — Telehealth: Payer: Self-pay | Admitting: Cardiology

## 2017-08-09 DIAGNOSIS — I427 Cardiomyopathy due to drug and external agent: Secondary | ICD-10-CM

## 2017-08-09 DIAGNOSIS — T451X5A Adverse effect of antineoplastic and immunosuppressive drugs, initial encounter: Secondary | ICD-10-CM

## 2017-08-09 NOTE — Telephone Encounter (Signed)
LMOVM reminding pt to send remote transmission.   

## 2017-08-10 ENCOUNTER — Encounter: Payer: Self-pay | Admitting: Cardiology

## 2017-08-10 NOTE — Progress Notes (Signed)
Remote ICD transmission.   

## 2017-08-13 ENCOUNTER — Other Ambulatory Visit: Payer: Self-pay | Admitting: Cardiovascular Disease

## 2017-08-14 NOTE — Telephone Encounter (Signed)
Rx request sent to pharmacy.  

## 2017-08-16 ENCOUNTER — Other Ambulatory Visit: Payer: Self-pay | Admitting: Cardiovascular Disease

## 2017-08-17 NOTE — Telephone Encounter (Signed)
REFILL 

## 2017-08-22 DIAGNOSIS — E1149 Type 2 diabetes mellitus with other diabetic neurological complication: Secondary | ICD-10-CM | POA: Diagnosis not present

## 2017-08-24 LAB — CUP PACEART REMOTE DEVICE CHECK
Date Time Interrogation Session: 20190214140841
Implantable Lead Implant Date: 20130603
Implantable Lead Location: 753858
Implantable Lead Location: 753859
Implantable Lead Location: 753860
Implantable Pulse Generator Implant Date: 20130606
MDC IDC LEAD IMPLANT DT: 20130603
MDC IDC LEAD IMPLANT DT: 20130603
Pulse Gen Serial Number: 7036093

## 2017-08-28 DIAGNOSIS — I10 Essential (primary) hypertension: Secondary | ICD-10-CM | POA: Diagnosis not present

## 2017-08-28 DIAGNOSIS — E119 Type 2 diabetes mellitus without complications: Secondary | ICD-10-CM | POA: Diagnosis not present

## 2017-09-06 ENCOUNTER — Encounter: Payer: Medicare Other | Admitting: Cardiovascular Disease

## 2017-09-06 DIAGNOSIS — N183 Chronic kidney disease, stage 3 (moderate): Secondary | ICD-10-CM | POA: Diagnosis not present

## 2017-09-06 DIAGNOSIS — E785 Hyperlipidemia, unspecified: Secondary | ICD-10-CM | POA: Diagnosis not present

## 2017-09-06 DIAGNOSIS — I10 Essential (primary) hypertension: Secondary | ICD-10-CM | POA: Diagnosis not present

## 2017-09-06 DIAGNOSIS — E119 Type 2 diabetes mellitus without complications: Secondary | ICD-10-CM | POA: Diagnosis not present

## 2017-09-06 DIAGNOSIS — Z853 Personal history of malignant neoplasm of breast: Secondary | ICD-10-CM | POA: Diagnosis not present

## 2017-09-06 DIAGNOSIS — J45909 Unspecified asthma, uncomplicated: Secondary | ICD-10-CM | POA: Diagnosis not present

## 2017-09-06 DIAGNOSIS — E1149 Type 2 diabetes mellitus with other diabetic neurological complication: Secondary | ICD-10-CM | POA: Diagnosis not present

## 2017-09-06 DIAGNOSIS — I4891 Unspecified atrial fibrillation: Secondary | ICD-10-CM | POA: Diagnosis not present

## 2017-09-11 ENCOUNTER — Ambulatory Visit (HOSPITAL_COMMUNITY)
Admission: RE | Admit: 2017-09-11 | Discharge: 2017-09-11 | Disposition: A | Discharge status: Home / Self Care | Payer: Medicare Other | Source: Ambulatory Visit | Attending: Orthopedic Surgery | Admitting: Orthopedic Surgery

## 2017-09-11 ENCOUNTER — Other Ambulatory Visit (HOSPITAL_COMMUNITY): Payer: Self-pay | Admitting: Orthopedic Surgery

## 2017-09-11 DIAGNOSIS — M79605 Pain in left leg: Secondary | ICD-10-CM | POA: Diagnosis not present

## 2017-09-11 DIAGNOSIS — R52 Pain, unspecified: Secondary | ICD-10-CM | POA: Insufficient documentation

## 2017-09-11 DIAGNOSIS — M7989 Other specified soft tissue disorders: Secondary | ICD-10-CM | POA: Diagnosis not present

## 2017-09-11 DIAGNOSIS — M25572 Pain in left ankle and joints of left foot: Secondary | ICD-10-CM | POA: Diagnosis not present

## 2017-09-11 NOTE — Progress Notes (Signed)
Left lower extremity venous duplex has been completed. Negative for DVT. Results were given to Josh T PA at Dr. Alvester Morin office.  09/11/17 2:22 PM Carlos Levering RVT

## 2017-10-04 ENCOUNTER — Encounter: Payer: Self-pay | Admitting: Cardiovascular Disease

## 2017-10-04 ENCOUNTER — Ambulatory Visit (INDEPENDENT_AMBULATORY_CARE_PROVIDER_SITE_OTHER): Payer: Medicare Other | Admitting: Cardiovascular Disease

## 2017-10-04 VITALS — BP 120/72 | HR 71 | Ht 67.0 in | Wt 255.2 lb

## 2017-10-04 DIAGNOSIS — I480 Paroxysmal atrial fibrillation: Secondary | ICD-10-CM

## 2017-10-04 DIAGNOSIS — M1A29X0 Drug-induced chronic gout, multiple sites, without tophus (tophi): Secondary | ICD-10-CM | POA: Diagnosis not present

## 2017-10-04 DIAGNOSIS — I427 Cardiomyopathy due to drug and external agent: Secondary | ICD-10-CM

## 2017-10-04 DIAGNOSIS — E782 Mixed hyperlipidemia: Secondary | ICD-10-CM

## 2017-10-04 DIAGNOSIS — T451X5A Adverse effect of antineoplastic and immunosuppressive drugs, initial encounter: Secondary | ICD-10-CM | POA: Diagnosis not present

## 2017-10-04 DIAGNOSIS — I447 Left bundle-branch block, unspecified: Secondary | ICD-10-CM

## 2017-10-04 DIAGNOSIS — Z794 Long term (current) use of insulin: Secondary | ICD-10-CM

## 2017-10-04 DIAGNOSIS — Z95810 Presence of automatic (implantable) cardiac defibrillator: Secondary | ICD-10-CM | POA: Diagnosis not present

## 2017-10-04 DIAGNOSIS — E119 Type 2 diabetes mellitus without complications: Secondary | ICD-10-CM

## 2017-10-04 DIAGNOSIS — I5022 Chronic systolic (congestive) heart failure: Secondary | ICD-10-CM | POA: Diagnosis not present

## 2017-10-04 DIAGNOSIS — I472 Ventricular tachycardia: Secondary | ICD-10-CM | POA: Diagnosis not present

## 2017-10-04 DIAGNOSIS — I10 Essential (primary) hypertension: Secondary | ICD-10-CM

## 2017-10-04 DIAGNOSIS — I4729 Other ventricular tachycardia: Secondary | ICD-10-CM

## 2017-10-04 NOTE — Patient Instructions (Signed)
Dr Sallyanne Kuster recommends that you continue on your current medications as directed. Please refer to the Current Medication list given to you today.  Remote monitoring is used to monitor your Pacemaker of ICD from home. This monitoring reduces the number of office visits required to check your device to one time per year. It allows [place] to keep an eye on the functioning of your device to ensure it is working properly. You are scheduled for a device check from home on Wednesday, May 1st, 2019. You may send your transmission at any time that day. If you have a wireless device, the transmission will be sent automatically. After your physician reviews your transmission, you will receive a postcard with your next transmission date.  Dr Sallyanne Kuster recommends that you schedule a follow-up appointment in 6 months with a pacemaker check. You will receive a reminder letter in the mail two months in advance. If you don't receive a letter, please call our office to schedule the follow-up appointment.  If you need a refill on your cardiac medications before your next appointment, please call your pharmacy.

## 2017-10-04 NOTE — Progress Notes (Signed)
Patient ID: Sara Walker, female   DOB: 07/03/1951, 67 y.o.   MRN: [id number]     Cardiology Office Note    Date:  10/06/2017   ID:  Sara Walker, DOB June 13, 1951, MRN [id number]  PCP:  Deland Pretty, MD  Cardiologist:   Sanda Klein, MD   Chief Complaint  Patient presents with  . Pacemaker Check  . Congestive Heart Failure    History of Present Illness:  Sara Walker is a 67 y.o. female who presents for severe nonischemic cardiomyopathy and advanced (stage D) combined systolic and diastolic heart failure, s/p CRT-D, history of paroxysmal atrial fibrillation and polymorphic VT requiring ICD shocks (in the setting of hypokalemia). She has a left bundle branch block and showed substantial improvement in clinical status after cardiac resynchronization therapy. However, LVEF remains 20-25 %. She has been intolerant of ACE inhibitors, ARB's and Entresto.  Had a good year.  The fact that she has to care for her grandchildren seems to give her both emotional and physical benefits.  Not required diuretic dose escalation or hospitalization for heart failure.  Does not even remember when she last had to take the metolazone tablet.  Her blood pressure is well controlled.  She remains morbidly obese.  Denies orthopnea, PND, angina, palpitations, syncope or dizziness.  Year ago she decided to stop Xarelto because of the cost and was unwilling to go on warfarin.  Thankfully, her device shows that she has not had any atrial fibrillation.  She has not even had a spell of atrial tachycardia since March 2018.  She has no need for atrial pacing and has virtually 100% biventricular pacing.  There has been no atrial mode switch and no VT since her last device check.  Lead parameters are generally good, but she has a relatively high left ventricular pacing threshold (3.3 V at 0.8 ms  M3-RV coil configuration).  Thoracic impedance showed brief volume increase around the holidays, quickly resolved.  Battery  longevity is about 4 months.  She would like [name] to do her generator change at when the time comes.   Past Medical History:  Diagnosis Date  . Angina   . Automatic implantable cardioverter-defibrillator in situ   . Breast cancer ([place])   . Cardiomyopathy secondary to chemotherapy [facility])    a.  doxurubicin;  b. s/p SJM Quadra Assura CRT-D, model [id number], Ser# [id number]  . CHF (congestive heart failure) ([place])   . Chronic systolic heart failure ([place])   . Coronary artery disease   . DM2 (diabetes mellitus, type 2) ([place])   . Goiter   . Gout   . Hyperlipidemia   . Hypertension   . Invasive ductal carcinoma of breast ([place])   . LBBB (left bundle branch block)    evidnece of RBBB fall 2012  . 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 Pacemaker   . PONV (postoperative nausea and vomiting)    nausea post ICD placement relieved with Zofran  . Shortness of breath   . Syncope     Past Surgical History:  Procedure Laterality Date  . BI-VENTRICULAR IMPLANTABLE CARDIOVERTER DEFIBRILLATOR N/A 12/12/2011   Procedure: BI-VENTRICULAR IMPLANTABLE CARDIOVERTER DEFIBRILLATOR  (CRT-D);  Surgeon: Deboraha Sprang, MD;  Location: [facility] CATH LAB;  Service: Cardiovascular;  Laterality: N/A;  . BREAST SURGERY     Left mastectomy  . CARDIAC DEFIBRILLATOR PLACEMENT     [employer]  . [place]  . DILATION AND  CURETTAGE OF UTERUS  1975  . FOOT SURGERY  1994  . MASTECTOMY     right  . NM MYOCAR PERF WALL MOTION  12/01/2011   no ischemia; EF 22%  . [place]  . TUNNELED VENOUS CATHETER PLACEMENT     removed    Outpatient Medications Prior to Visit  Medication Sig Dispense Refill  . allopurinol (ZYLOPRIM) 300 MG tablet Take 450 mg once a day    . carvedilol (COREG) 25 MG tablet Take 1 tablet (25 mg total) by mouth 2 (two) times daily. 180 tablet 3  . Cholecalciferol (VITAMIN D) 2000 units CAPS Take 2 capsules by mouth daily.    .  colchicine 0.6 MG tablet Take 0.6 mg by mouth daily as needed (for pain).     Marland Kitchen KLOR-CON M20 20 MEQ tablet TAKE ONE TABLET BY MOUTH ONCE DAILY (TAKE AN EXTRA 40 MEQ WHEN YOU TAKE METOLAZONE) 180 tablet 1  . Lutein 20 MG CAPS Take 1 capsule by mouth daily.    . magnesium oxide (MAG-OX) 400 (241.3 Mg) MG tablet TAKE 2 TABLETS BY MOUTH ALTERNATING WITH 1 TABLET DAILY 135 tablet 3  . metolazone (ZAROXOLYN) 5 MG tablet TAKE ONE TABLET BY MOUTH ONCE DAILY AS NEEDED (TAKE  IF  WEIGHT  IS  OVER  255  LBS) 90 tablet 1  . spironolactone (ALDACTONE) 50 MG tablet TAKE 1 TABLET BY MOUTH ONCE DAILY 90 tablet 2  . torsemide (DEMADEX) 100 MG tablet TAKE ONE TABLET BY MOUTH ONCE DAILY 90 tablet 2  . carvedilol (COREG) 25 MG tablet Take 1 tablet (25 mg total) by mouth 2 (two) times daily. (Patient not taking: Reported on 10/04/2017) 180 tablet 0  . Insulin Lispro, Human, (HUMALOG ) Inject 100-150 Units into the skin daily.     . vitamin B-12 (CYANOCOBALAMIN) 500 MCG tablet Take 500 mcg by mouth daily as needed.      No facility-administered medications prior to visit.      Allergies:   Lasix [furosemide]; Ace inhibitors; Codeine; Digitalis; Levaquin [levofloxacin in d5w]; Other; Statins; and Zyrtec [cetirizine]   Social History   Socioeconomic History  . Marital status: Divorced    Spouse name: Not on file  . Number of children: Not on file  . Years of education: Not on file  . Highest education level: Not on file  Occupational History  . Not on file  Social Needs  . Financial resource strain: Not on file  . Food insecurity:    Worry: Not on file    Inability: Not on file  . Transportation needs:    Medical: Not on file    Non-medical: Not on file  Tobacco Use  . Smoking status: Former Research scientist (life sciences)  . Smokeless tobacco: Never Used  Substance and Sexual Activity  . Alcohol use: No    Alcohol/week: 0.0 oz  . Drug use: No  . Sexual activity: Not Currently  Lifestyle  . Physical activity:    Days  per week: Not on file    Minutes per session: Not on file  . Stress: Not on file  Relationships  . Social connections:    Talks on phone: Not on file    Gets together: Not on file    Attends religious service: Not on file    Active member of club or organization: Not on file    Attends meetings of clubs or organizations: Not on file    Relationship status: Not on file  Other  Topics Concern  . Not on file  Social History Narrative  . Not on file     Family History:  The patient's family history includes Diabetes in her brother; Heart attack in her father; Hypertension in her brother.   ROS:   Please see the history of present illness.    ROS All other systems reviewed and are negative.   PHYSICAL EXAM:   VS:  BP 120/72   Pulse 71   Ht 5\' 7"  (1.702 m)   Wt 255 lb 3.2 oz (115.8 kg)   BMI 39.97 kg/m     General: Alert, oriented x3, no distress, morbidly obese Head: no evidence of trauma, PERRL, EOMI, no exophtalmos or lid lag, no myxedema, no xanthelasma; normal ears, nose and oropharynx Neck: normal jugular venous pulsations and no hepatojugular reflux; brisk carotid pulses without delay and no carotid bruits Chest: clear to auscultation, no signs of consolidation by percussion or palpation, normal fremitus, symmetrical and full respiratory excursions. Healthy left subclavian device site. Cardiovascular: normal position and quality of the apical impulse, regular rhythm, normal first and second heart sounds, no murmurs, rubs or gallops Abdomen: no tenderness or distention, no masses by palpation, no abnormal pulsatility or arterial bruits, normal bowel sounds, no hepatosplenomegaly Extremities: no clubbing, cyanosis or edema; 2+ radial, ulnar and brachial pulses bilaterally; 2+ right femoral, posterior tibial and dorsalis pedis pulses; 2+ left femoral, posterior tibial and dorsalis pedis pulses; no subclavian or femoral bruits Neurological: grossly nonfocal Psych: Normal mood and  affect   Wt Readings from Last 3 Encounters:  10/04/17 255 lb 3.2 oz (115.8 kg)  08/10/16 264 lb (119.7 kg)  02/16/16 263 lb (119.3 kg)      Studies/Labs Reviewed:   EKG:  EKG is ordered today.  The ekg ordered today demonstrates atrial sensed biventricular paced rhythm, +ve R V1-V2  Labs were checked by PCP and "were all OK , including glucose and lipids"  LABS  May 2017 Hemoglobin 13.6, BUN 21, creatinine 1.0, potassium 3.8, normal LFTs, uric acid 5.0 Total cholesterol 232, transfers to and 46, HDL 44, LDL 139 TSH 2.31 Hemoglobin A1c 7.4%  ASSESSMENT:    1. Chronic systolic heart failure ([place])   2. Cardiomyopathy secondary to chemotherapy ([place])   3. LBBB (left bundle branch block)   4. Ventricular tachycardia (paroxysmal) ([place])   5. Biventricular ICD (implantable cardioverter-defibrillator) in place   6. Paroxysmal atrial fibrillation (HCC)   7. Essential hypertension   8. Morbid obesity ([place])   9. Type 2 diabetes mellitus without complication, with long-term current use of insulin ([place])   10. 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 Mixed hyperlipidemia    PLAN:  In order of problems listed above:  1. CHF: Clinically euvolemic, NYHA functional class II, normal thoracic impedance trend. 2. CMP: nonischemic, severely depressed LVEF 20-25% 3. LBBB: Substantial symptom improvement after implementation of CRT, despite absence of significant increase in LVEF 4. VT/TdP: None seen since 2016. Previous events occurred in the setting of heart failure exacerbation and electrolyte imbalance with 2 appropriate defibrillator discharges. 5. Normal CRT-D device function. Remote downloads every 3 months. 6. AFib: Remarkably, it has been well over 2 years since her last episode of atrial fibrillation.  She does not want to take anticoagulants.  At this point I have no additional data with which to try to convince her.  CHADSVasc 5 (age, gender, DM, CHF,  HTN). 7. HTN: Well-controlled on carvedilol and spironolactone, intolerant to  both ACE inhibitors and angiotensin receptor blockers due to cough. 8. Morbid obesity: BMI at 40. She has had tremendous swings in her weight over the last few years, usually related to swings in mood. Current weight has been stable for about 2 years. 9. DM: on insulin, she reports she is well controlled. 10. Gout: has not had any recent gout attacks 11. HLP: She has been intolerant to statins. She does not have coronary disease by angiography or other vascular complications, but does have diabetes mellitus. Ideally, LDL<100. Dueto cost, refuses to consider PCS K9 inhibitors.     Medication Adjustments/Labs and Tests Ordered: Current medicines are reviewed at length with the patient today.  Concerns regarding medicines are outlined above.  Medication changes, Labs and Tests ordered today are listed in the Patient Instructions below. Patient Instructions  Dr Sallyanne Kuster recommends that you continue on your current medications as directed. Please refer to the Current Medication list given to you today.  Remote monitoring is used to monitor your Pacemaker of ICD from home. This monitoring reduces the number of office visits required to check your device to one time per year. It allows [place] to keep an eye on the functioning of your device to ensure it is working properly. You are scheduled for a device check from home on Wednesday, May 1st, 2019. You may send your transmission at any time that day. If you have a wireless device, the transmission will be sent automatically. After your physician reviews your transmission, you will receive a postcard with your next transmission date.  Dr Sallyanne Kuster recommends that you schedule a follow-up appointment in 6 months with a pacemaker check. You will receive a reminder letter in the mail two months in advance. If you don't receive a letter, please call our office to schedule the follow-up  appointment.  If you need a refill on your cardiac medications before your next appointment, please call your pharmacy.      Signed, Sanda Klein, MD  10/06/2017 12:25 PM    [address], [place],   [address] Phone: [phone]; Fax: [phone]

## 2017-10-06 ENCOUNTER — Encounter: Payer: Self-pay | Admitting: Cardiovascular Disease

## 2017-10-09 DIAGNOSIS — M25572 Pain in left ankle and joints of left foot: Secondary | ICD-10-CM | POA: Diagnosis not present

## 2017-10-12 DIAGNOSIS — M1A09X0 Idiopathic chronic gout, multiple sites, without tophus (tophi): Secondary | ICD-10-CM | POA: Diagnosis not present

## 2017-10-12 DIAGNOSIS — M2550 Pain in unspecified joint: Secondary | ICD-10-CM | POA: Diagnosis not present

## 2017-10-12 DIAGNOSIS — E669 Obesity, unspecified: Secondary | ICD-10-CM | POA: Diagnosis not present

## 2017-10-12 DIAGNOSIS — Z6839 Body mass index (BMI) 39.0-39.9, adult: Secondary | ICD-10-CM | POA: Diagnosis not present

## 2017-10-12 DIAGNOSIS — N183 Chronic kidney disease, stage 3 (moderate): Secondary | ICD-10-CM | POA: Diagnosis not present

## 2017-10-24 DIAGNOSIS — M2042 Other hammer toe(s) (acquired), left foot: Secondary | ICD-10-CM | POA: Diagnosis not present

## 2017-10-24 DIAGNOSIS — E1142 Type 2 diabetes mellitus with diabetic polyneuropathy: Secondary | ICD-10-CM | POA: Diagnosis not present

## 2017-10-24 DIAGNOSIS — L84 Corns and callosities: Secondary | ICD-10-CM | POA: Diagnosis not present

## 2017-10-24 DIAGNOSIS — B351 Tinea unguium: Secondary | ICD-10-CM | POA: Diagnosis not present

## 2017-11-06 ENCOUNTER — Other Ambulatory Visit: Payer: Self-pay | Admitting: Cardiovascular Disease

## 2017-11-06 NOTE — Telephone Encounter (Signed)
REFILL 

## 2017-11-08 ENCOUNTER — Ambulatory Visit (INDEPENDENT_AMBULATORY_CARE_PROVIDER_SITE_OTHER): Payer: Medicare Other | Admitting: *Deleted

## 2017-11-08 ENCOUNTER — Telehealth: Payer: Self-pay | Admitting: Cardiology

## 2017-11-08 DIAGNOSIS — T451X5A Adverse effect of antineoplastic and immunosuppressive drugs, initial encounter: Secondary | ICD-10-CM | POA: Diagnosis not present

## 2017-11-08 DIAGNOSIS — I427 Cardiomyopathy due to drug and external agent: Secondary | ICD-10-CM

## 2017-11-08 NOTE — Telephone Encounter (Signed)
Spoke with pt and reminded pt of remote transmission that is due today. Pt verbalized understanding.   

## 2017-11-09 ENCOUNTER — Encounter: Payer: Self-pay | Admitting: Cardiology

## 2017-11-09 NOTE — Progress Notes (Signed)
Remote ICD transmission.   

## 2017-11-09 NOTE — Progress Notes (Signed)
Letter  

## 2017-11-28 LAB — CUP PACEART REMOTE DEVICE CHECK
Date Time Interrogation Session: 20190521142237
Implantable Lead Implant Date: 20130603
Implantable Lead Implant Date: 20130603
Implantable Lead Location: 753859
Implantable Pulse Generator Implant Date: 20130606
MDC IDC LEAD IMPLANT DT: 20130603
MDC IDC LEAD LOCATION: 753858
MDC IDC LEAD LOCATION: 753860
Pulse Gen Serial Number: 7036093

## 2017-12-07 DIAGNOSIS — M1A00X0 Idiopathic chronic gout, unspecified site, without tophus (tophi): Secondary | ICD-10-CM | POA: Diagnosis not present

## 2017-12-07 DIAGNOSIS — I10 Essential (primary) hypertension: Secondary | ICD-10-CM | POA: Diagnosis not present

## 2017-12-07 DIAGNOSIS — N390 Urinary tract infection, site not specified: Secondary | ICD-10-CM | POA: Diagnosis not present

## 2017-12-07 DIAGNOSIS — E785 Hyperlipidemia, unspecified: Secondary | ICD-10-CM | POA: Diagnosis not present

## 2017-12-07 DIAGNOSIS — E119 Type 2 diabetes mellitus without complications: Secondary | ICD-10-CM | POA: Diagnosis not present

## 2017-12-07 DIAGNOSIS — E1149 Type 2 diabetes mellitus with other diabetic neurological complication: Secondary | ICD-10-CM | POA: Diagnosis not present

## 2018-01-09 DIAGNOSIS — I10 Essential (primary) hypertension: Secondary | ICD-10-CM | POA: Diagnosis not present

## 2018-01-09 DIAGNOSIS — E119 Type 2 diabetes mellitus without complications: Secondary | ICD-10-CM | POA: Diagnosis not present

## 2018-01-09 DIAGNOSIS — E7800 Pure hypercholesterolemia, unspecified: Secondary | ICD-10-CM | POA: Diagnosis not present

## 2018-01-09 DIAGNOSIS — Z0000 Encounter for general adult medical examination without abnormal findings: Secondary | ICD-10-CM | POA: Diagnosis not present

## 2018-01-12 LAB — CUP PACEART INCLINIC DEVICE CHECK
Implantable Lead Implant Date: 20130603
Implantable Lead Location: 753860
Implantable Pulse Generator Implant Date: 20130606
MDC IDC LEAD IMPLANT DT: 20130603
MDC IDC LEAD IMPLANT DT: 20130603
MDC IDC LEAD LOCATION: 753858
MDC IDC LEAD LOCATION: 753859
MDC IDC SESS DTM: 20190705135436
Pulse Gen Serial Number: 7036093

## 2018-01-20 ENCOUNTER — Other Ambulatory Visit: Payer: Self-pay | Admitting: Cardiovascular Disease

## 2018-01-22 ENCOUNTER — Telehealth: Payer: Self-pay | Admitting: *Deleted

## 2018-01-22 NOTE — Telephone Encounter (Signed)
Ms. Boer calling due to alert felt this morning. Her SJM ICD is ERI as of today. She will need follow-up with Dr. Loletha Grayer. I have sent a message to scheduling and his covering nurse to help arrange follow-up.  Ms. Leanos verbalizes understanding.

## 2018-01-25 ENCOUNTER — Ambulatory Visit (INDEPENDENT_AMBULATORY_CARE_PROVIDER_SITE_OTHER): Payer: Medicare Other | Admitting: Cardiovascular Disease

## 2018-01-25 ENCOUNTER — Encounter: Payer: Self-pay | Admitting: Cardiovascular Disease

## 2018-01-25 VITALS — BP 122/60 | HR 78 | Ht 67.0 in | Wt 251.4 lb

## 2018-01-25 DIAGNOSIS — I428 Other cardiomyopathies: Secondary | ICD-10-CM

## 2018-01-25 DIAGNOSIS — I10 Essential (primary) hypertension: Secondary | ICD-10-CM

## 2018-01-25 DIAGNOSIS — I480 Paroxysmal atrial fibrillation: Secondary | ICD-10-CM

## 2018-01-25 DIAGNOSIS — M1A09X0 Idiopathic chronic gout, multiple sites, without tophus (tophi): Secondary | ICD-10-CM | POA: Diagnosis not present

## 2018-01-25 DIAGNOSIS — E119 Type 2 diabetes mellitus without complications: Secondary | ICD-10-CM

## 2018-01-25 DIAGNOSIS — I5043 Acute on chronic combined systolic (congestive) and diastolic (congestive) heart failure: Secondary | ICD-10-CM

## 2018-01-25 DIAGNOSIS — I5022 Chronic systolic (congestive) heart failure: Secondary | ICD-10-CM | POA: Diagnosis not present

## 2018-01-25 DIAGNOSIS — E1169 Type 2 diabetes mellitus with other specified complication: Secondary | ICD-10-CM

## 2018-01-25 DIAGNOSIS — Z4502 Encounter for adjustment and management of automatic implantable cardiac defibrillator: Secondary | ICD-10-CM

## 2018-01-25 DIAGNOSIS — E669 Obesity, unspecified: Secondary | ICD-10-CM

## 2018-01-25 DIAGNOSIS — I472 Ventricular tachycardia: Secondary | ICD-10-CM

## 2018-01-25 DIAGNOSIS — I447 Left bundle-branch block, unspecified: Secondary | ICD-10-CM | POA: Diagnosis not present

## 2018-01-25 DIAGNOSIS — I4721 Torsades de pointes: Secondary | ICD-10-CM

## 2018-01-25 DIAGNOSIS — Z794 Long term (current) use of insulin: Secondary | ICD-10-CM | POA: Diagnosis not present

## 2018-01-25 DIAGNOSIS — E7800 Pure hypercholesterolemia, unspecified: Secondary | ICD-10-CM

## 2018-01-25 MED ORDER — LORAZEPAM 1 MG PO TABS: 1.0000 mg | ORAL_TABLET | ORAL | 0 refills | Status: DC | PRN

## 2018-01-25 NOTE — Patient Instructions (Addendum)
  [facility] [place], [address]  [place], [place] [address]  Phone: [phone] Fax: [phone]   You are scheduled for an ICD Generator Change on Friday, August 16st, 2019 with Dr Sallyanne Kuster.  Please arrive at the [facility] at 11:30 am on the day of your procedure.  1. You may eat an early, light breakfast but nothing to eat after 8:00 am.   2. Complete labwork prior to your procedure at the latest Wednesday, August 14th, 2019. You can have these done at [facility] in [place].   3. You may continue your current medications except: NO insulin boluses.  4. Bring your insurance cards and a list of your current medications.  5. Wash your chest and neck with the surgical scrub provided the evening before and the morning of your procedure. See "Preparing for Surgery" instructions provided. Rinse well.  If you have ANY questions after you get home, please call the office [phone].  Thank you, Sara Croitoru,MD [place], CMA  * Special note:  Every effort is made to have your procedure done on time.  Occasionally there are emergencies that present themselves at the hospital that may cause delays.  Please be patient if a delay does occur.  Preparing for Surgery  Before surgery, you can play an important role. Because skin is not sterile, your skin needs to be as free of germs as possible. You can reduce the number of germs on your skin by washing with CHG (chlorhexidine gluconate) Soap before surgery. CHG is an antiseptic cleaner which kills germs and bonds with the skin to continue killing germs even after washing.  Please do not use if you have an allergy to CHG or antibacterial soaps. If your skin becomes reddened/irritated, STOP using the CHG.  DO NOT SHAVE (including legs and underarms) for at least 48 hours prior to first CHG shower. It is OK to shave your face.  Please  follow these instructions carefully: 1. Shower the night before surgery and the morning of surgery with CHG Soap. 2. If you chose to wash your hair, wash your hair first as usual with your normal shampoo/conditioner. 3. After you shampoo/condition, rinse you hair and body thoroughly to remove shampoo/conditioner. 4. Use CHG as you would any other liquid soap. You can apply CHG directly to the skin and wash gently with a loofah or a clean washcloth. 5. Apply the CHG Soap to your body ONLY FROM THE NECK DOWN. Do not use on open wounds or open sores. Avoid contact with your eyes, ears, mouth, and genitals (private parts). Wash genitals (private part) with your normal soap. 6. Wash thoroughly, paying special attention to the area where your surgery will be performed. 7. Thoroughly rinse your body with warm water from the neck down. 8. DO NOT shower/wash with your normal soap after using and rinsing off the CHG Soap. 9. Pat yourself dry with a clean towel. 10. Wear clean pajamas to bed. 11. Place clean sheets on your bed the night of your first shower and do not sleep with pets..  Day of Surgery: Shower with the CHG Soap following the instructions listed above. DO NOT apply deodorants or lotions. Please wear clean clothes to the hospital/surgery center.

## 2018-01-25 NOTE — Progress Notes (Signed)
Patient ID: Sara Walker, female   DOB: [birth year], 67 y.o.   MRN: [id number]     Cardiology Office Note    Date:  01/25/2018   ID:  Sara Walker, DOB 1950/09/16, MRN [id number]  PCP:  Deland Pretty, MD  Cardiologist:   Sanda Klein, MD   Chief Complaint  Patient presents with  . Follow-up    device battery, surgery scheduled     History of Present Illness:  Sara Walker is a 67 y.o. female who presents for severe nonischemic cardiomyopathy and advanced (stage D) combined systolic and diastolic heart failure, s/p CRT-D, history of paroxysmal atrial fibrillation and polymorphic VT requiring ICD shocks (in the setting of hypokalemia). She has a left bundle branch block and showed substantial improvement in clinical status after cardiac resynchronization therapy. However, LVEF remains 20-25 %. She has been intolerant of ACE inhibitors, ARB's and Entresto.  Her defibrillator reached ERI a couple of days ago.  She is here to discuss the change out procedure.  Device function is otherwise normal.  She has not required VT/VF therapies since 2016 when she had her signs in the setting of severe electrolyte abnormalities.  She has always had a relatively high left ventricular lead pacing thresholds (3.375 V at 0.8 ms, M3-RV coil) all the other lead parameters are excellent.  She has not had atrial fibrillation in years.  She stopped Xarelto, primarily due to its cost, refuses to take warfarin.  We are using her device to warn [place] whether she has atrial fibrillation.  The last episode 12 months which was paroxysmal atrial tachycardia in March 2018.  Corvue shows some variability, but the episodes of "overload" are generally brief.  One such episode occurred over last week, but has already returned spontaneously to baseline.  The patient specifically denies any chest pain at rest exertion, dyspnea at rest or with exertion, orthopnea, paroxysmal nocturnal dyspnea, syncope, palpitations, focal  neurological deficits, intermittent claudication, lower extremity edema, unexplained weight gain, cough, hemoptysis or wheezing.  She has not had any recent gout attacks.  She continues to take care of her grandchildren, ages 38 months to 84 years.  After years of chemotherapy she has very difficult venous access.  She dreads having labs drawn on getting an intravenous line placed.  To the point that she has some degree of PTSD.  At her request, we will give her a few doses of lorazepam to deal with the anxiety  Past Medical History:  Diagnosis Date  . Angina   . Automatic implantable cardioverter-defibrillator in situ   . Breast cancer ([place])   . Cardiomyopathy secondary to chemotherapy [facility])    a.  doxurubicin;  b. s/p SJM Quadra Assura CRT-D, model [id number] Q, Ser# [id number]  . CHF (congestive heart failure) ([place])   . Chronic systolic heart failure ([place])   . Coronary artery disease   . DM2 (diabetes mellitus, type 2) ([place])   . Goiter   . Gout   . Hyperlipidemia   . Hypertension   . Invasive ductal carcinoma of breast ([place])   . LBBB (left bundle branch block)    evidnece of RBBB fall 2012  . 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 Pacemaker   . PONV (postoperative nausea and vomiting)    nausea post ICD placement relieved with Zofran  . Shortness of breath   . Syncope     Past Surgical History:  Procedure Laterality Date  . BI-VENTRICULAR IMPLANTABLE CARDIOVERTER DEFIBRILLATOR N/A 12/12/2011  Procedure: BI-VENTRICULAR IMPLANTABLE CARDIOVERTER DEFIBRILLATOR  (CRT-D);  Surgeon: Deboraha Sprang, MD;  Location: [facility] CATH LAB;  Service: Cardiovascular;  Laterality: N/A;  . BREAST SURGERY     Left mastectomy  . CARDIAC DEFIBRILLATOR PLACEMENT     [employer]ude Quadra  . [place]  . DILATION AND CURETTAGE OF UTERUS  1975  . FOOT SURGERY  1994  . MASTECTOMY     right  . NM MYOCAR PERF WALL MOTION  12/01/2011   no ischemia; EF 22%  . [place]  . TUNNELED VENOUS CATHETER PLACEMENT     removed    Outpatient Medications Prior to Visit  Medication Sig Dispense Refill  . allopurinol (ZYLOPRIM) 300 MG tablet Take 450 mg once a day    . carvedilol (COREG) 25 MG tablet Take 1 tablet (25 mg total) by mouth 2 (two) times daily. 180 tablet 3  . carvedilol (COREG) 25 MG tablet TAKE 1 TABLET BY MOUTH TWICE DAILY 180 tablet 1  . Cholecalciferol (VITAMIN D) 2000 units CAPS Take 2 capsules by mouth daily.    . colchicine 0.6 MG tablet Take 0.6 mg by mouth daily as needed (for pain).     Marland Kitchen KLOR-CON M20 20 MEQ tablet TAKE ONE TABLET BY MOUTH ONCE DAILY (TAKE AN EXTRA 40 MEQ WHEN YOU TAKE METOLAZONE) 180 tablet 1  . Lutein 20 MG CAPS Take 1 capsule by mouth daily.    . magnesium oxide (MAG-OX) 400 (241.3 Mg) MG tablet TAKE 2 TABLETS BY MOUTH ALTERNATING WITH 1 TABLET DAILY 135 tablet 3  . metolazone (ZAROXOLYN) 5 MG tablet TAKE ONE TABLET BY MOUTH ONCE DAILY AS NEEDED (TAKE  IF  WEIGHT  IS  OVER  255  POUNDS) 90 tablet 1  . spironolactone (ALDACTONE) 50 MG tablet TAKE 1 TABLET BY MOUTH ONCE DAILY 90 tablet 2  . torsemide (DEMADEX) 100 MG tablet TAKE 1 TABLET BY MOUTH ONCE DAILY 90 tablet 1   No facility-administered medications prior to visit.      Allergies:   Lasix [furosemide]; Ace inhibitors; Codeine; Digitalis; Levaquin [levofloxacin in d5w]; Other; Statins; and Zyrtec [cetirizine]   Social History   Socioeconomic History  . Marital status: Divorced    Spouse name: Not on file  . Number of children: Not on file  . Years of education: Not on file  . Highest education level: Not on file  Occupational History  . Not on file  Social Needs  . Financial resource strain: Not on file  . Food insecurity:    Worry: Not on file    Inability: Not on file  . Transportation needs:    Medical: Not on file    Non-medical: Not on file  Tobacco Use  . Smoking status: Former Research scientist (life sciences)  . Smokeless tobacco: Never Used  Substance  and Sexual Activity  . Alcohol use: No    Alcohol/week: 0.0 oz  . Drug use: No  . Sexual activity: Not Currently  Lifestyle  . Physical activity:    Days per week: Not on file    Minutes per session: Not on file  . Stress: Not on file  Relationships  . Social connections:    Talks on phone: Not on file    Gets together: Not on file    Attends religious service: Not on file    Active member of club or organization: Not on file    Attends meetings of clubs or organizations: Not on file  Relationship status: Not on file  Other Topics Concern  . Not on file  Social History Narrative  . Not on file     Family History:  The patient's family history includes Diabetes in her brother; Heart attack in her father; Hypertension in her brother.   ROS:   Please see the history of present illness.    ROS All other systems reviewed and are negative.   PHYSICAL EXAM:   VS:  BP 122/60 (BP Location: Left Arm, Patient Position: Sitting)   Pulse 78   Ht 5\' 7"  (1.702 m)   Wt 251 lb 6.4 oz (114 kg)   BMI 39.37 kg/m     General: Alert, oriented x3, no distress, morbidly obese Head: no evidence of trauma, PERRL, EOMI, no exophtalmos or lid lag, no myxedema, no xanthelasma; normal ears, nose and oropharynx Neck: normal jugular venous pulsations and no hepatojugular reflux; brisk carotid pulses without delay and no carotid bruits Chest: clear to auscultation, no signs of consolidation by percussion or palpation, normal fremitus, symmetrical and full respiratory excursions, the left subclavian defibrillator site looks healthy Cardiovascular: normal position and quality of the apical impulse, regular rhythm, normal first and second heart sounds, no murmurs, rubs or gallops Abdomen: no tenderness or distention, no masses by palpation, no abnormal pulsatility or arterial bruits, normal bowel sounds, no hepatosplenomegaly Extremities: no clubbing, cyanosis or edema; 2+ radial, ulnar and brachial  pulses bilaterally; 2+ right femoral, posterior tibial and dorsalis pedis pulses; 2+ left femoral, posterior tibial and dorsalis pedis pulses; no subclavian or femoral bruits Neurological: grossly nonfocal Psych: Normal mood and affect   Wt Readings from Last 3 Encounters:  01/25/18 251 lb 6.4 oz (114 kg)  10/04/17 255 lb 3.2 oz (115.8 kg)  08/10/16 264 lb (119.7 kg)      Studies/Labs Reviewed:   EKG:  EKG is ordered today.  The ekg ordered today demonstrates atrial sensed biventricular paced rhythm, +ve R V1-V2  Labs were checked by PCP and "were all OK , including glucose and lipids"  LABS  May 2017 Hemoglobin 13.6, BUN 21, creatinine 1.0, potassium 3.8, normal LFTs, uric acid 5.0 Total cholesterol 232, transfers to and 46, HDL 44, LDL 139 TSH 2.31 Hemoglobin A1c 7.4%  ASSESSMENT:    No diagnosis found. PLAN:  In order of problems listed above:  1. CHF: Clinically euvolemic, NYHA functional class II.  Her thoracic impedance is very somewhat but is currently at baseline. 2. CMP: Nonischemic cardiomyopathy with severely depressed left ventricular systolic function that has remained at about 20-25% despite CRRT.  Despite that, she had substantial clinical improvement after CRT pacing was implemented, with a marked reduction in the episodes of heart failure exacerbation or need for hospitalization.  She did not tolerate either ACE inhibitors or ARB due to cough. 3. VT/TdP: In 2016 she had torsade de pointes in the setting of major electrolyte abnormalities and heart failure exacerbation.  None since that time. 4. CRT-D at [facility] we will schedule for generator change out in August. This procedure has been fully reviewed with the patient and informed consent has been obtained. 5.  Alarm tones for ERI disabled. 6. AFib: Remarkably, it has been almost 3 years since her last episode of atrial fibrillation.  She continues to adamantly decline anticoagulants.  We will continue to use her  device to detect any lengthy episodes of atrial fibrillation that would impose the need to reconsider.  CHADSVasc 5 (age, gender, DM, CHF, HTN). 7. HTN: Well-controlled  8. Morbid obesity: She is not sure why but she has recently lost 20 pounds.  She reports that she had normal thyroid tests.  She had probably explains in weight over the years. 9. DM: on insulin, she reports she is well controlled.  We will check a hemoglobin A1c at the time of her preprocedure labs, to limit the number of times he has to have phlebotomy 10. Gout: has not had any recent gout attacks, doing better after starting out acute 11. HLP: She did not have any coronary disease on angiography.  Ideally she would be on lipid-lowering therapy since he has diabetes mellitus, but she has been intolerant to multiple statins.  We offered PCSK9 inhibitors but she declined due to cost.    Medication Adjustments/Labs and Tests Ordered: Current medicines are reviewed at length with the patient today.  Concerns regarding medicines are outlined above.  Medication changes, Labs and Tests ordered today are listed in the Patient Instructions below. Patient Instructions       Signed, Sanda Klein, MD  01/25/2018 12:09 PM    [facility] [place], [place], [place]  [address] Phone: [phone]; Fax: [phone]

## 2018-01-25 NOTE — H&P (View-Only) (Signed)
Patient ID: Sara Walker, female   DOB: [birth year], 67 y.o.   MRN: [id number]     Cardiology Office Note    Date:  01/25/2018   ID:  Sara Walker, DOB May 06, 1951, MRN [id number]  PCP:  Sara Pretty, MD  Cardiologist:   Sara Klein, MD   Chief Complaint  Patient presents with  . Follow-up    device battery, surgery scheduled     History of Present Illness:  Sara Walker is a 67 y.o. female who presents for severe nonischemic cardiomyopathy and advanced (stage D) combined systolic and diastolic heart failure, s/p CRT-D, history of paroxysmal atrial fibrillation and polymorphic VT requiring ICD shocks (in the setting of hypokalemia). She has a left bundle branch block and showed substantial improvement in clinical status after cardiac resynchronization therapy. However, LVEF remains 20-25 %. She has been intolerant of ACE inhibitors, ARB's and Entresto.  Her defibrillator reached ERI a couple of days ago.  She is here to discuss the change out procedure.  Device function is otherwise normal.  She has not required VT/VF therapies since 2016 when she had her signs in the setting of severe electrolyte abnormalities.  She has always had a relatively high left ventricular lead pacing thresholds (3.375 V at 0.8 ms, M3-RV coil) all the other lead parameters are excellent.  She has not had atrial fibrillation in years.  She stopped Xarelto, primarily due to its cost, refuses to take warfarin.  We are using her device to warn [place] whether she has atrial fibrillation.  The last episode 12 months which was paroxysmal atrial tachycardia in March 2018.  Corvue shows some variability, but the episodes of "overload" are generally brief.  One such episode occurred over last week, but has already returned spontaneously to baseline.  The patient specifically denies any chest pain at rest exertion, dyspnea at rest or with exertion, orthopnea, paroxysmal nocturnal dyspnea, syncope, palpitations, focal  neurological deficits, intermittent claudication, lower extremity edema, unexplained weight gain, cough, hemoptysis or wheezing.  She has not had any recent gout attacks.  She continues to take care of her grandchildren, ages 45 months to 76 years.  After years of chemotherapy she has very difficult venous access.  She dreads having labs drawn on getting an intravenous line placed.  To the point that she has some degree of PTSD.  At her request, we will give her a few doses of lorazepam to deal with the anxiety  Past Medical History:  Diagnosis Date  . Angina   . Automatic implantable cardioverter-defibrillator in situ   . Breast cancer ([place])   . Cardiomyopathy secondary to chemotherapy [facility])    a.  doxurubicin;  b. s/p SJM Quadra Assura CRT-D, model B3937269 Q, Ser# [id number]  . CHF (congestive heart failure) ([place])   . Chronic systolic heart failure ([place])   . Coronary artery disease   . DM2 (diabetes mellitus, type 2) ([place])   . Goiter   . Gout   . Hyperlipidemia   . Hypertension   . Invasive ductal carcinoma of breast ([place])   . LBBB (left bundle branch block)    evidnece of RBBB fall 2012  . 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 Pacemaker   . PONV (postoperative nausea and vomiting)    nausea post ICD placement relieved with Zofran  . Shortness of breath   . Syncope     Past Surgical History:  Procedure Laterality Date  . BI-VENTRICULAR IMPLANTABLE CARDIOVERTER DEFIBRILLATOR N/A 12/12/2011  Procedure: BI-VENTRICULAR IMPLANTABLE CARDIOVERTER DEFIBRILLATOR  (CRT-D);  Surgeon: Deboraha Sprang, MD;  Location: [facility] CATH LAB;  Service: Cardiovascular;  Laterality: N/A;  . BREAST SURGERY     Left mastectomy  . CARDIAC DEFIBRILLATOR PLACEMENT     [employer]ude Quadra  . [place]  . DILATION AND CURETTAGE OF UTERUS  1975  . FOOT SURGERY  1994  . MASTECTOMY     right  . NM MYOCAR PERF WALL MOTION  12/01/2011   no ischemia; EF 22%  . [place]  . TUNNELED VENOUS CATHETER PLACEMENT     removed    Outpatient Medications Prior to Visit  Medication Sig Dispense Refill  . allopurinol (ZYLOPRIM) 300 MG tablet Take 450 mg once a day    . carvedilol (COREG) 25 MG tablet Take 1 tablet (25 mg total) by mouth 2 (two) times daily. 180 tablet 3  . carvedilol (COREG) 25 MG tablet TAKE 1 TABLET BY MOUTH TWICE DAILY 180 tablet 1  . Cholecalciferol (VITAMIN D) 2000 units CAPS Take 2 capsules by mouth daily.    . colchicine 0.6 MG tablet Take 0.6 mg by mouth daily as needed (for pain).     Marland Kitchen KLOR-CON M20 20 MEQ tablet TAKE ONE TABLET BY MOUTH ONCE DAILY (TAKE AN EXTRA 40 MEQ WHEN YOU TAKE METOLAZONE) 180 tablet 1  . Lutein 20 MG CAPS Take 1 capsule by mouth daily.    . magnesium oxide (MAG-OX) 400 (241.3 Mg) MG tablet TAKE 2 TABLETS BY MOUTH ALTERNATING WITH 1 TABLET DAILY 135 tablet 3  . metolazone (ZAROXOLYN) 5 MG tablet TAKE ONE TABLET BY MOUTH ONCE DAILY AS NEEDED (TAKE  IF  WEIGHT  IS  OVER  255  POUNDS) 90 tablet 1  . spironolactone (ALDACTONE) 50 MG tablet TAKE 1 TABLET BY MOUTH ONCE DAILY 90 tablet 2  . torsemide (DEMADEX) 100 MG tablet TAKE 1 TABLET BY MOUTH ONCE DAILY 90 tablet 1   No facility-administered medications prior to visit.      Allergies:   Lasix [furosemide]; Ace inhibitors; Codeine; Digitalis; Levaquin [levofloxacin in d5w]; Other; Statins; and Zyrtec [cetirizine]   Social History   Socioeconomic History  . Marital status: Divorced    Spouse name: Not on file  . Number of children: Not on file  . Years of education: Not on file  . Highest education level: Not on file  Occupational History  . Not on file  Social Needs  . Financial resource strain: Not on file  . Food insecurity:    Worry: Not on file    Inability: Not on file  . Transportation needs:    Medical: Not on file    Non-medical: Not on file  Tobacco Use  . Smoking status: Former Research scientist (life sciences)  . Smokeless tobacco: Never Used  Substance  and Sexual Activity  . Alcohol use: No    Alcohol/week: 0.0 oz  . Drug use: No  . Sexual activity: Not Currently  Lifestyle  . Physical activity:    Days per week: Not on file    Minutes per session: Not on file  . Stress: Not on file  Relationships  . Social connections:    Talks on phone: Not on file    Gets together: Not on file    Attends religious service: Not on file    Active member of club or organization: Not on file    Attends meetings of clubs or organizations: Not on file  Relationship status: Not on file  Other Topics Concern  . Not on file  Social History Narrative  . Not on file     Family History:  The patient's family history includes Diabetes in her brother; Heart attack in her father; Hypertension in her brother.   ROS:   Please see the history of present illness.    ROS All other systems reviewed and are negative.   PHYSICAL EXAM:   VS:  BP 122/60 (BP Location: Left Arm, Patient Position: Sitting)   Pulse 78   Ht 5\' 7"  (1.702 m)   Wt 251 lb 6.4 oz (114 kg)   BMI 39.37 kg/m     General: Alert, oriented x3, no distress, morbidly obese Head: no evidence of trauma, PERRL, EOMI, no exophtalmos or lid lag, no myxedema, no xanthelasma; normal ears, nose and oropharynx Neck: normal jugular venous pulsations and no hepatojugular reflux; brisk carotid pulses without delay and no carotid bruits Chest: clear to auscultation, no signs of consolidation by percussion or palpation, normal fremitus, symmetrical and full respiratory excursions, the left subclavian defibrillator site looks healthy Cardiovascular: normal position and quality of the apical impulse, regular rhythm, normal first and second heart sounds, no murmurs, rubs or gallops Abdomen: no tenderness or distention, no masses by palpation, no abnormal pulsatility or arterial bruits, normal bowel sounds, no hepatosplenomegaly Extremities: no clubbing, cyanosis or edema; 2+ radial, ulnar and brachial  pulses bilaterally; 2+ right femoral, posterior tibial and dorsalis pedis pulses; 2+ left femoral, posterior tibial and dorsalis pedis pulses; no subclavian or femoral bruits Neurological: grossly nonfocal Psych: Normal mood and affect   Wt Readings from Last 3 Encounters:  01/25/18 251 lb 6.4 oz (114 kg)  10/04/17 255 lb 3.2 oz (115.8 kg)  08/10/16 264 lb (119.7 kg)      Studies/Labs Reviewed:   EKG:  EKG is ordered today.  The ekg ordered today demonstrates atrial sensed biventricular paced rhythm, +ve R V1-V2  Labs were checked by PCP and "were all OK , including glucose and lipids"  LABS  May 2017 Hemoglobin 13.6, BUN 21, creatinine 1.0, potassium 3.8, normal LFTs, uric acid 5.0 Total cholesterol 232, transfers to and 46, HDL 44, LDL 139 TSH 2.31 Hemoglobin A1c 7.4%  ASSESSMENT:    No diagnosis found. PLAN:  In order of problems listed above:  1. CHF: Clinically euvolemic, NYHA functional class II.  Her thoracic impedance is very somewhat but is currently at baseline. 2. CMP: Nonischemic cardiomyopathy with severely depressed left ventricular systolic function that has remained at about 20-25% despite CRRT.  Despite that, she had substantial clinical improvement after CRT pacing was implemented, with a marked reduction in the episodes of heart failure exacerbation or need for hospitalization.  She did not tolerate either ACE inhibitors or ARB due to cough. 3. VT/TdP: In 2016 she had torsade de pointes in the setting of major electrolyte abnormalities and heart failure exacerbation.  None since that time. 4. CRT[facility]D at [facility] we will schedule for generator change out in August. This procedure has been fully reviewed with the patient and informed consent has been obtained. 5.  Alarm tones for ERI disabled. 6. AFib: Remarkably, it has been almost 3 years since her last episode of atrial fibrillation.  She continues to adamantly decline anticoagulants.  We will continue to use her  device to detect any lengthy episodes of atrial fibrillation that would impose the need to reconsider.  CHADSVasc 5 (age, gender, DM, CHF, HTN). 7. HTN: Well-controlled  8. Morbid obesity: She is not sure why but she has recently lost 20 pounds.  She reports that she had normal thyroid tests.  She had probably explains in weight over the years. 9. DM: on insulin, she reports she is well controlled.  We will check a hemoglobin A1c at the time of her preprocedure labs, to limit the number of times he has to have phlebotomy 10. Gout: has not had any recent gout attacks, doing better after starting out acute 11. HLP: She did not have any coronary disease on angiography.  Ideally she would be on lipid-lowering therapy since he has diabetes mellitus, but she has been intolerant to multiple statins.  We offered PCSK9 inhibitors but she declined due to cost.    Medication Adjustments/Labs and Tests Ordered: Current medicines are reviewed at length with the patient today.  Concerns regarding medicines are outlined above.  Medication changes, Labs and Tests ordered today are listed in the Patient Instructions below. Patient Instructions       Signed, Sara Klein, MD  01/25/2018 12:09 PM    [facility] [place], [place], [place]  [address] Phone: [phone]; Fax: [phone]

## 2018-01-26 ENCOUNTER — Other Ambulatory Visit: Payer: Self-pay | Admitting: Cardiovascular Disease

## 2018-01-26 ENCOUNTER — Telehealth: Payer: Self-pay | Admitting: Cardiovascular Disease

## 2018-01-26 NOTE — Telephone Encounter (Signed)
New message:      Pt c/o medication issue:  1. Name of Medication: LORazepam (ATIVAN) 1 MG tablet  2. How are you currently taking this medication (dosage and times per day)? Take 1 tablet (1 mg total) by mouth as needed for anxiety.  3. Are you having a reaction (difficulty breathing--STAT)? No  4. What is your medication issue? [facility] is calling to see how this pt is taking this medication for insurance purposes.

## 2018-01-26 NOTE — Telephone Encounter (Signed)
[facility] notified of med instructions.

## 2018-01-26 NOTE — Telephone Encounter (Signed)
How many times a day can she use PRN ativan? Not specified on instructions. Thanks

## 2018-01-26 NOTE — Telephone Encounter (Signed)
Take once or twice daily as needed, 30-60 minutes before medical procedures. MCr

## 2018-01-29 ENCOUNTER — Telehealth: Payer: Self-pay | Admitting: Cardiovascular Disease

## 2018-01-29 ENCOUNTER — Other Ambulatory Visit (INDEPENDENT_AMBULATORY_CARE_PROVIDER_SITE_OTHER): Payer: Medicare Other

## 2018-01-29 DIAGNOSIS — I447 Left bundle-branch block, unspecified: Secondary | ICD-10-CM

## 2018-01-29 DIAGNOSIS — I5043 Acute on chronic combined systolic (congestive) and diastolic (congestive) heart failure: Secondary | ICD-10-CM

## 2018-01-29 DIAGNOSIS — I5022 Chronic systolic (congestive) heart failure: Secondary | ICD-10-CM

## 2018-01-29 DIAGNOSIS — I480 Paroxysmal atrial fibrillation: Secondary | ICD-10-CM

## 2018-01-29 NOTE — Telephone Encounter (Signed)
Spoke with [facility] CMA with Dr Sallyanne Kuster regarding date. Patient is scheduled for 02/23/18, reviewed with patient.

## 2018-01-29 NOTE — Telephone Encounter (Signed)
New Message       Patient is a little confuse about her surgery date, she was given one date in the office and another date on her paper work. Patient would like a call with the correct information of her surgery date. Patient also states she stays in a  Remote area and her cell phone works sometimes and sometimes it don't, so please leave a message. Thank you.

## 2018-01-29 NOTE — Progress Notes (Signed)
kg

## 2018-01-30 ENCOUNTER — Other Ambulatory Visit: Payer: Self-pay | Admitting: Cardiovascular Disease

## 2018-01-30 DIAGNOSIS — E1142 Type 2 diabetes mellitus with diabetic polyneuropathy: Secondary | ICD-10-CM | POA: Diagnosis not present

## 2018-01-30 DIAGNOSIS — B351 Tinea unguium: Secondary | ICD-10-CM | POA: Diagnosis not present

## 2018-01-30 DIAGNOSIS — M79675 Pain in left toe(s): Secondary | ICD-10-CM | POA: Diagnosis not present

## 2018-01-30 DIAGNOSIS — M79674 Pain in right toe(s): Secondary | ICD-10-CM | POA: Diagnosis not present

## 2018-02-06 DIAGNOSIS — Z794 Long term (current) use of insulin: Secondary | ICD-10-CM | POA: Diagnosis not present

## 2018-02-06 DIAGNOSIS — E119 Type 2 diabetes mellitus without complications: Secondary | ICD-10-CM | POA: Diagnosis not present

## 2018-02-06 DIAGNOSIS — Z4502 Encounter for adjustment and management of automatic implantable cardiac defibrillator: Secondary | ICD-10-CM | POA: Diagnosis not present

## 2018-02-06 DIAGNOSIS — I5022 Chronic systolic (congestive) heart failure: Secondary | ICD-10-CM | POA: Diagnosis not present

## 2018-02-07 ENCOUNTER — Ambulatory Visit (INDEPENDENT_AMBULATORY_CARE_PROVIDER_SITE_OTHER): Payer: Medicare Other | Admitting: *Deleted

## 2018-02-07 ENCOUNTER — Encounter: Payer: Self-pay | Admitting: Cardiology

## 2018-02-07 DIAGNOSIS — I428 Other cardiomyopathies: Secondary | ICD-10-CM | POA: Diagnosis not present

## 2018-02-07 LAB — BASIC METABOLIC PANEL
BUN/Creatinine Ratio: 22 (ref 12–28)
BUN: 20 mg/dL (ref 8–27)
CALCIUM: 10.1 mg/dL (ref 8.7–10.3)
CO2: 28 mmol/L (ref 20–29)
CREATININE: 0.89 mg/dL (ref 0.57–1.00)
Chloride: 92 mmol/L — ABNORMAL LOW (ref 96–106)
GFR calc Af Amer: 78 mL/min/{1.73_m2} (ref >59)
GFR calc non Af Amer: 68 mL/min/{1.73_m2} (ref >59)
GLUCOSE: 110 mg/dL — AB (ref 65–99)
Potassium: 3.9 mmol/L (ref 3.5–5.2)
Sodium: 134 mmol/L (ref 134–144)

## 2018-02-07 LAB — CBC
HEMATOCRIT: 39.6 % (ref 34.0–46.6)
HEMOGLOBIN: 13.4 g/dL (ref 11.1–15.9)
MCH: 30.8 pg (ref 26.6–33.0)
MCHC: 33.8 g/dL (ref 31.5–35.7)
MCV: 91 fL (ref 79–97)
Platelets: 228 10*3/uL (ref 150–450)
RBC: 4.35 x10E6/uL (ref 3.77–5.28)
RDW: 14.6 % (ref 12.3–15.4)
WBC: 9.3 10*3/uL (ref 3.4–10.8)

## 2018-02-07 LAB — PROTIME-INR
INR: 0.9 (ref 0.8–1.2)
Prothrombin Time: 9.8 s (ref 9.1–12.0)

## 2018-02-07 LAB — HEMOGLOBIN A1C
ESTIMATED AVERAGE GLUCOSE: 163 mg/dL
Hgb A1c MFr Bld: 7.3 % — ABNORMAL HIGH (ref 4.8–5.6)

## 2018-02-07 NOTE — Progress Notes (Signed)
Remote ICD transmission.   

## 2018-02-21 ENCOUNTER — Other Ambulatory Visit: Payer: Self-pay

## 2018-02-21 DIAGNOSIS — Z4502 Encounter for adjustment and management of automatic implantable cardiac defibrillator: Secondary | ICD-10-CM

## 2018-02-23 ENCOUNTER — Ambulatory Visit (HOSPITAL_COMMUNITY)
Admission: RE | Admit: 2018-02-23 | Discharge: 2018-02-23 | Disposition: A | Discharge status: Home / Self Care | Payer: Medicare Other | Source: Ambulatory Visit | Attending: Cardiovascular Disease | Admitting: Cardiovascular Disease

## 2018-02-23 ENCOUNTER — Encounter (HOSPITAL_COMMUNITY)
Admission: RE | Disposition: A | Discharge status: Home / Self Care | Payer: Self-pay | Source: Ambulatory Visit | Attending: Cardiovascular Disease

## 2018-02-23 ENCOUNTER — Other Ambulatory Visit: Payer: Self-pay

## 2018-02-23 DIAGNOSIS — I110 Hypertensive heart disease with heart failure: Secondary | ICD-10-CM | POA: Diagnosis not present

## 2018-02-23 DIAGNOSIS — E785 Hyperlipidemia, unspecified: Secondary | ICD-10-CM | POA: Diagnosis not present

## 2018-02-23 DIAGNOSIS — Z6839 Body mass index (BMI) 39.0-39.9, adult: Secondary | ICD-10-CM | POA: Insufficient documentation

## 2018-02-23 DIAGNOSIS — E119 Type 2 diabetes mellitus without complications: Secondary | ICD-10-CM | POA: Diagnosis not present

## 2018-02-23 DIAGNOSIS — I5042 Chronic combined systolic (congestive) and diastolic (congestive) heart failure: Secondary | ICD-10-CM | POA: Insufficient documentation

## 2018-02-23 DIAGNOSIS — Z95810 Presence of automatic (implantable) cardiac defibrillator: Secondary | ICD-10-CM

## 2018-02-23 DIAGNOSIS — Z794 Long term (current) use of insulin: Secondary | ICD-10-CM | POA: Insufficient documentation

## 2018-02-23 DIAGNOSIS — I2510 Atherosclerotic heart disease of native coronary artery without angina pectoris: Secondary | ICD-10-CM | POA: Insufficient documentation

## 2018-02-23 DIAGNOSIS — Z8249 Family history of ischemic heart disease and other diseases of the circulatory system: Secondary | ICD-10-CM | POA: Diagnosis not present

## 2018-02-23 DIAGNOSIS — Z87891 Personal history of nicotine dependence: Secondary | ICD-10-CM | POA: Insufficient documentation

## 2018-02-23 DIAGNOSIS — I480 Paroxysmal atrial fibrillation: Secondary | ICD-10-CM | POA: Insufficient documentation

## 2018-02-23 DIAGNOSIS — I428 Other cardiomyopathies: Secondary | ICD-10-CM | POA: Diagnosis not present

## 2018-02-23 DIAGNOSIS — M109 Gout, unspecified: Secondary | ICD-10-CM | POA: Insufficient documentation

## 2018-02-23 DIAGNOSIS — I447 Left bundle-branch block, unspecified: Secondary | ICD-10-CM | POA: Insufficient documentation

## 2018-02-23 DIAGNOSIS — I5022 Chronic systolic (congestive) heart failure: Secondary | ICD-10-CM

## 2018-02-23 DIAGNOSIS — Z4502 Encounter for adjustment and management of automatic implantable cardiac defibrillator: Secondary | ICD-10-CM | POA: Insufficient documentation

## 2018-02-23 DIAGNOSIS — Z885 Allergy status to narcotic agent status: Secondary | ICD-10-CM | POA: Insufficient documentation

## 2018-02-23 HISTORY — PX: BIV ICD GENERATOR CHANGEOUT: EP1194

## 2018-02-23 LAB — SURGICAL PCR SCREEN
MRSA, PCR: NEGATIVE
STAPHYLOCOCCUS AUREUS: NEGATIVE

## 2018-02-23 LAB — GLUCOSE, CAPILLARY
GLUCOSE-CAPILLARY: 112 mg/dL — AB (ref 70–99)
Glucose-Capillary: 189 mg/dL — ABNORMAL HIGH (ref 70–99)

## 2018-02-23 SURGERY — BIV ICD GENERATOR CHANGEOUT

## 2018-02-23 MED ORDER — SODIUM CHLORIDE 0.9 % IV SOLN
INTRAVENOUS | Status: AC
  Filled 2018-02-23: qty 2

## 2018-02-23 MED ORDER — MIDAZOLAM HCL 5 MG/5ML IJ SOLN
INTRAMUSCULAR | Status: DC | PRN
  Administered 2018-02-23 (×2): 1 mg via INTRAVENOUS

## 2018-02-23 MED ORDER — SODIUM CHLORIDE 0.9 % IV SOLN: 250.0000 mL | INTRAVENOUS | Status: DC | PRN

## 2018-02-23 MED ORDER — ACETAMINOPHEN 325 MG PO TABS
325.0000 mg | ORAL_TABLET | ORAL | Status: DC | PRN
Start: 2018-02-23 — End: 2018-02-23
  Filled 2018-02-23: qty 2

## 2018-02-23 MED ORDER — CEFAZOLIN SODIUM-DEXTROSE 2-4 GM/100ML-% IV SOLN
INTRAVENOUS | Status: AC
  Filled 2018-02-23: qty 100

## 2018-02-23 MED ORDER — LIDOCAINE HCL (PF) 1 % IJ SOLN
INTRAMUSCULAR | Status: DC | PRN
  Administered 2018-02-23: 60 mL

## 2018-02-23 MED ORDER — FENTANYL CITRATE (PF) 100 MCG/2ML IJ SOLN
INTRAMUSCULAR | Status: AC
  Filled 2018-02-23: qty 2

## 2018-02-23 MED ORDER — FENTANYL CITRATE (PF) 100 MCG/2ML IJ SOLN
INTRAMUSCULAR | Status: DC | PRN
  Administered 2018-02-23 (×2): 50 ug via INTRAVENOUS

## 2018-02-23 MED ORDER — MIDAZOLAM HCL 5 MG/5ML IJ SOLN
INTRAMUSCULAR | Status: AC
  Filled 2018-02-23: qty 5

## 2018-02-23 MED ORDER — SODIUM CHLORIDE 0.9% FLUSH: 3.0000 mL | Freq: Two times a day (BID) | INTRAVENOUS | Status: DC

## 2018-02-23 MED ORDER — SODIUM CHLORIDE 0.9 % IV SOLN
80.0000 mg | INTRAVENOUS | Status: AC
  Administered 2018-02-23: 80 mg
  Filled 2018-02-23: qty 2

## 2018-02-23 MED ORDER — CHLORHEXIDINE GLUCONATE 4 % EX LIQD
60.0000 mL | Freq: Once | CUTANEOUS | Status: DC
  Filled 2018-02-23: qty 60

## 2018-02-23 MED ORDER — SODIUM CHLORIDE 0.9% FLUSH: 3.0000 mL | INTRAVENOUS | Status: DC | PRN

## 2018-02-23 MED ORDER — MUPIROCIN 2 % EX OINT
TOPICAL_OINTMENT | CUTANEOUS | Status: AC
  Administered 2018-02-23: 13:00:00
  Filled 2018-02-23: qty 22

## 2018-02-23 MED ORDER — LIDOCAINE HCL (PF) 1 % IJ SOLN
INTRAMUSCULAR | Status: AC
  Filled 2018-02-23: qty 60

## 2018-02-23 MED ORDER — CEFAZOLIN SODIUM-DEXTROSE 2-4 GM/100ML-% IV SOLN
2.0000 g | INTRAVENOUS | Status: AC
  Administered 2018-02-23: 2 g via INTRAVENOUS
  Filled 2018-02-23: qty 100

## 2018-02-23 MED ORDER — SODIUM CHLORIDE 0.9 % IV SOLN
INTRAVENOUS | Status: DC
  Administered 2018-02-23: 13:00:00 via INTRAVENOUS

## 2018-02-23 SURGICAL SUPPLY — 5 items
ASSURA CRTD CD3369-40Q (ICD Generator) ×3 IMPLANT
CABLE SURGICAL S-101-97-12 (CABLE) ×3 IMPLANT
DEFIB ASSURA MULTI-CHMBR CRT-D (ICD Generator) ×1 IMPLANT
PAD DEFIB LIFELINK (PAD) ×3 IMPLANT
TRAY PACEMAKER INSERTION (PACKS) ×3 IMPLANT

## 2018-02-23 NOTE — Discharge Instructions (Signed)
Supplemental Discharge Instructions for  Pacemaker/Defibrillator Patients  Activity No restrictions starting tomorrow. DO wear your seatbelt, even if it crosses over the pacemaker site.  WOUND CARE - Keep the wound area clean and dry.  Remove the dressing the day after you return home (usually 48 hours after the procedure). - DO NOT SUBMERGE UNDER WATER UNTIL FULLY HEALED (no tub baths, hot tubs, swimming pools, etc.).  - You  may shower or take a sponge bath after the dressing is removed. DO NOT SOAK the area and do not allow the shower to directly spray on the site. - If you have staples, these will be removed in the office in 7-14 days. - If you have tape/steri-strips on your wound, these will fall off; do not pull them off prematurely.   - No bandage is needed on the site.  DO  NOT apply any creams, oils, or ointments to the wound area. - If you notice any drainage or discharge from the wound, any swelling, excessive redness or bruising at the site, or if you develop a fever > 101? F after you are discharged home, call the office at once.  Special Instructions - You are still able to use cellular telephones.  Avoid carrying your cellular phone near your device. - When traveling through airports, show security personnel your identification card to avoid being screened in the metal detectors.  - Avoid arc welding equipment, MRI testing (magnetic resonance imaging), TENS units (transcutaneous nerve stimulators).  Call the office for questions about other devices. - Avoid electrical appliances that are in poor condition or are not properly grounded. - Microwave ovens are safe to be near or to operate.  Additional information for defibrillator patients should your device go off: - If your device goes off ONCE and you feel fine afterward, notify the clinic at [phone]. - If your device goes off ONCE and you do not feel well afterward, call 911. - If your device goes off TWICE or more in  one day, call 911.  DO NOT DRIVE YOURSELF OR A FAMILY MEMBER WITH A DEFIBRILLATOR TO THE HOSPITAL--CALL 911.

## 2018-02-23 NOTE — Op Note (Signed)
Procedure report  Procedure performed:  1. CRT-D generator changeout  2. Sedation  Reason for procedure:  1. Device generator at elective replacement interval  2. Chronic systolic heart failure/LBBB Procedure performed by:  Sanda Klein, MD  Complications:  None  Estimated blood loss:  <5 mL  Medications administered during procedure:  Ancef 3 g intravenously, lidocaine 1% 30 mL locally, fentanyl 100 mcg intravenously, Versed 2 mg intravenously  During this procedure the patient is administered a total of Versed 2 mg and Fentanyl 100 mcg to achieve and maintain moderate conscious sedation.  The patient's heart rate, blood pressure, and oxygen saturation are monitored continuously during the procedure. The period of conscious sedation is 33 minutes, of which I was present face-to-face 100% of this time.  Device details:   [place] number [id number], serial number [id number] Right atrial lead (chronic) [employer], model number 2088TC-52, serial number [id number] (implanted 12/12/2011) Right ventricular lead (chronic)  [employer] , model number [id number], serial number [id number] (implanted 12/12/2011) Left ventricular lead  [employer], model number [id number], serial number [id number] (implanted 12/12/2011) Explanted generator St Jude [id number], serial number  [id number] (implanted 12/12/2011)  Procedure details:  After the risks and benefits of the procedure were discussed the patient provided informed consent. She was brought to the cardiac catheter lab in the fasting state. The patient was prepped and draped in usual sterile fashion. Local anesthesia with 1% lidocaine was administered to to the left infraclavicular area. A 5-6cm horizontal incision was made parallel with and 2-3 cm caudal to the left clavicle, in the area of an old scar. An older scar was seen closer to the left clavicle. Using minimal electrocautery and mostly sharp and blunt dissection the prepectoral  pocket was opened carefully to avoid injury to the loops of chronic leads. Extensive dissection was not necessary. The device was explanted. The pocket was carefully inspected for hemostasis and flushed with copious amounts of antibiotic solution.  The leads were disconnected from the old generator and testing of the lead parameters later showed excellent values. The new generator was connected to the chronic leads, with appropriate pacing noted.   The entire system was then carefully inserted in the pocket with care been taking that the leads and device assumed a comfortable position without pressure on the incision. Great care was taken that the leads be located deep to the generator. The pocket was then closed in layers using 2 layers of 2-0 Vicryl and cutaneous staples after which a sterile dressing was applied.   At the end of the procedure the following lead parameters were encountered:   Right atrial lead sensed P waves 2.8 mV, impedance 390 ohms, threshold 0.7 at 0.5 ms pulse width.  Right ventricular lead sensed R waves  >12 mV, impedance 550 ohms, threshold 0.7 at 0.5 ms pulse width. High voltage 78 ohm.  Left ventricular lead impedance 690 ohms, threshold 3.0 at 1.0 ms pulse width.  Sanda Klein, MD, [facility] [phone] office [phone] pager

## 2018-02-23 NOTE — Interval H&P Note (Signed)
History and Physical Interval Note:  02/23/2018 1:18 PM  Sara Walker  has presented today for surgery, with the diagnosis of eri  The various methods of treatment have been discussed with the patient and family. After consideration of risks, benefits and other options for treatment, the patient has consented to  Procedure(s): BIV ICD [place] (N/A) as a surgical intervention .  The patient's history has been reviewed, patient examined, no change in status, stable for surgery.  I have reviewed the patient's chart and labs.  Questions were answered to the patient's satisfaction.     [name]

## 2018-02-26 ENCOUNTER — Encounter (HOSPITAL_COMMUNITY): Payer: Self-pay | Admitting: Cardiovascular Disease

## 2018-03-06 ENCOUNTER — Ambulatory Visit (INDEPENDENT_AMBULATORY_CARE_PROVIDER_SITE_OTHER): Payer: Medicare Other | Admitting: *Deleted

## 2018-03-06 ENCOUNTER — Telehealth: Payer: Self-pay | Admitting: *Deleted

## 2018-03-06 DIAGNOSIS — I472 Ventricular tachycardia: Secondary | ICD-10-CM

## 2018-03-06 DIAGNOSIS — Z95810 Presence of automatic (implantable) cardiac defibrillator: Secondary | ICD-10-CM | POA: Diagnosis not present

## 2018-03-06 DIAGNOSIS — I4729 Other ventricular tachycardia: Secondary | ICD-10-CM

## 2018-03-06 DIAGNOSIS — I5022 Chronic systolic (congestive) heart failure: Secondary | ICD-10-CM | POA: Diagnosis not present

## 2018-03-06 LAB — CUP PACEART INCLINIC DEVICE CHECK
HighPow Impedance: 69.75 Ohm
Implantable Lead Implant Date: 20130603
Implantable Lead Implant Date: 20130603
Implantable Lead Location: 753858
Implantable Lead Location: 753860
Lead Channel Impedance Value: 325 Ohm
Lead Channel Impedance Value: 550 Ohm
Lead Channel Pacing Threshold Amplitude: 0.75 V
Lead Channel Pacing Threshold Amplitude: 3 V
Lead Channel Pacing Threshold Pulse Width: 1 ms
Lead Channel Sensing Intrinsic Amplitude: 3.4 mV
Lead Channel Setting Pacing Amplitude: 2 V
Lead Channel Setting Pacing Amplitude: 3.75 V
Lead Channel Setting Sensing Sensitivity: 0.5 mV
MDC IDC LEAD IMPLANT DT: 20130603
MDC IDC LEAD LOCATION: 753859
MDC IDC MSMT BATTERY REMAINING LONGEVITY: 57 mo
MDC IDC MSMT LEADCHNL LV IMPEDANCE VALUE: 662.5 Ohm
MDC IDC MSMT LEADCHNL RA PACING THRESHOLD PULSEWIDTH: 0.5 ms
MDC IDC MSMT LEADCHNL RV PACING THRESHOLD AMPLITUDE: 0.75 V
MDC IDC MSMT LEADCHNL RV PACING THRESHOLD PULSEWIDTH: 0.5 ms
MDC IDC MSMT LEADCHNL RV SENSING INTR AMPL: 12 mV
MDC IDC PG IMPLANT DT: 20190816
MDC IDC SESS DTM: 20190827141233
MDC IDC SET LEADCHNL LV PACING PULSEWIDTH: 1 ms
MDC IDC SET LEADCHNL RV PACING AMPLITUDE: 2 V
MDC IDC SET LEADCHNL RV PACING PULSEWIDTH: 0.5 ms
MDC IDC STAT BRADY RA PERCENT PACED: 0.84 %
Pulse Gen Serial Number: 9842973

## 2018-03-06 NOTE — Telephone Encounter (Signed)
Spoke with patient to troubleshoot Merlin monitor. Monitor successfully updated via landline connection, no cell signal at home. Advised patient I will check back next week to ensure monitor stays updated. She is agreeable to plan and appreciative of call.

## 2018-03-06 NOTE — Progress Notes (Signed)
Wound check appointment. Staples removed. Wound without redness or edema. Incision edges approximated, wound well healed. Normal device function. Thresholds, sensing, and impedances consistent with implant measurements. Device programmed at chronic outputs s/p generator change. Histogram distribution appropriate for patient and level of activity. No mode switches or ventricular arrhythmias noted. Patient educated about wound care, arm mobility, lifting restrictions, shock plan, and Merlin monitor. ROV with [place] on 06/06/18.

## 2018-03-07 DIAGNOSIS — E119 Type 2 diabetes mellitus without complications: Secondary | ICD-10-CM | POA: Diagnosis not present

## 2018-03-07 DIAGNOSIS — E1149 Type 2 diabetes mellitus with other diabetic neurological complication: Secondary | ICD-10-CM | POA: Diagnosis not present

## 2018-03-09 DIAGNOSIS — I10 Essential (primary) hypertension: Secondary | ICD-10-CM | POA: Diagnosis not present

## 2018-03-09 DIAGNOSIS — E1149 Type 2 diabetes mellitus with other diabetic neurological complication: Secondary | ICD-10-CM | POA: Diagnosis not present

## 2018-03-09 DIAGNOSIS — E559 Vitamin D deficiency, unspecified: Secondary | ICD-10-CM | POA: Diagnosis not present

## 2018-03-15 ENCOUNTER — Ambulatory Visit: Payer: Medicare Other

## 2018-03-21 ENCOUNTER — Encounter: Payer: Medicare Other | Admitting: Cardiovascular Disease

## 2018-03-22 LAB — CUP PACEART REMOTE DEVICE CHECK
Brady Statistic RA Percent Paced: 1 % — CL
Brady Statistic RV Percent Paced: 99 %
HighPow Impedance: 87 Ohm
Implantable Pulse Generator Implant Date: 20190816
Lead Channel Impedance Value: 410 Ohm
Lead Channel Impedance Value: 550 Ohm
Lead Channel Impedance Value: 710 Ohm
Lead Channel Pacing Threshold Amplitude: 3.25 V
Lead Channel Pacing Threshold Pulse Width: 0.5 ms
Lead Channel Pacing Threshold Pulse Width: 0.5 ms
Lead Channel Pacing Threshold Pulse Width: 0.8 ms
Lead Channel Sensing Intrinsic Amplitude: 12 mV
Lead Channel Sensing Intrinsic Amplitude: 2.6 mV
Lead Channel Setting Pacing Amplitude: 1.5 V
Lead Channel Setting Pacing Amplitude: 2 V
Lead Channel Setting Pacing Amplitude: 3.25 V
Lead Channel Setting Pacing Pulse Width: 0.5 ms
Lead Channel Setting Pacing Pulse Width: 0.5 ms
Lead Channel Setting Sensing Sensitivity: 0.5 mV
MDC IDC MSMT LEADCHNL RA PACING THRESHOLD AMPLITUDE: 0.75 V
MDC IDC MSMT LEADCHNL RV PACING THRESHOLD AMPLITUDE: 0.75 V
MDC IDC SESS DTM: 20190912100322
Pulse Gen Serial Number: 9842973

## 2018-04-17 ENCOUNTER — Other Ambulatory Visit: Payer: Self-pay | Admitting: Cardiovascular Disease

## 2018-04-24 DIAGNOSIS — E1142 Type 2 diabetes mellitus with diabetic polyneuropathy: Secondary | ICD-10-CM | POA: Diagnosis not present

## 2018-04-24 DIAGNOSIS — B351 Tinea unguium: Secondary | ICD-10-CM | POA: Diagnosis not present

## 2018-05-28 ENCOUNTER — Ambulatory Visit (INDEPENDENT_AMBULATORY_CARE_PROVIDER_SITE_OTHER): Payer: Medicare Other

## 2018-05-28 DIAGNOSIS — I428 Other cardiomyopathies: Secondary | ICD-10-CM | POA: Diagnosis not present

## 2018-05-28 NOTE — Progress Notes (Signed)
Remote ICD transmission.   

## 2018-05-30 DIAGNOSIS — I10 Essential (primary) hypertension: Secondary | ICD-10-CM | POA: Diagnosis not present

## 2018-05-30 DIAGNOSIS — E559 Vitamin D deficiency, unspecified: Secondary | ICD-10-CM | POA: Diagnosis not present

## 2018-05-30 DIAGNOSIS — E1149 Type 2 diabetes mellitus with other diabetic neurological complication: Secondary | ICD-10-CM | POA: Diagnosis not present

## 2018-05-31 ENCOUNTER — Encounter: Payer: Self-pay | Admitting: Cardiology

## 2018-06-06 ENCOUNTER — Encounter: Payer: Medicare Other | Admitting: Cardiovascular Disease

## 2018-06-11 DIAGNOSIS — I5022 Chronic systolic (congestive) heart failure: Secondary | ICD-10-CM | POA: Diagnosis not present

## 2018-06-11 DIAGNOSIS — E1169 Type 2 diabetes mellitus with other specified complication: Secondary | ICD-10-CM | POA: Diagnosis not present

## 2018-06-20 ENCOUNTER — Ambulatory Visit (INDEPENDENT_AMBULATORY_CARE_PROVIDER_SITE_OTHER): Payer: Medicare Other | Admitting: Cardiovascular Disease

## 2018-06-20 ENCOUNTER — Encounter: Payer: Self-pay | Admitting: Cardiovascular Disease

## 2018-06-20 ENCOUNTER — Encounter: Payer: Medicare Other | Admitting: Cardiovascular Disease

## 2018-06-20 VITALS — BP 118/74 | HR 71 | Ht 68.0 in | Wt 250.4 lb

## 2018-06-20 DIAGNOSIS — E782 Mixed hyperlipidemia: Secondary | ICD-10-CM

## 2018-06-20 DIAGNOSIS — E1169 Type 2 diabetes mellitus with other specified complication: Secondary | ICD-10-CM | POA: Diagnosis not present

## 2018-06-20 DIAGNOSIS — I472 Ventricular tachycardia, unspecified: Secondary | ICD-10-CM

## 2018-06-20 DIAGNOSIS — E790 Hyperuricemia without signs of inflammatory arthritis and tophaceous disease: Secondary | ICD-10-CM | POA: Diagnosis not present

## 2018-06-20 DIAGNOSIS — Z95810 Presence of automatic (implantable) cardiac defibrillator: Secondary | ICD-10-CM | POA: Diagnosis not present

## 2018-06-20 DIAGNOSIS — E669 Obesity, unspecified: Secondary | ICD-10-CM | POA: Diagnosis not present

## 2018-06-20 DIAGNOSIS — I480 Paroxysmal atrial fibrillation: Secondary | ICD-10-CM

## 2018-06-20 DIAGNOSIS — I10 Essential (primary) hypertension: Secondary | ICD-10-CM

## 2018-06-20 DIAGNOSIS — I5042 Chronic combined systolic (congestive) and diastolic (congestive) heart failure: Secondary | ICD-10-CM | POA: Diagnosis not present

## 2018-06-20 NOTE — Patient Instructions (Addendum)
Medication Instructions:  Dr Sallyanne Kuster recommends that you continue on your current medications as directed. Please refer to the Current Medication list given to you today.  If you need a refill on your cardiac medications before your next appointment, please call your pharmacy.   Testing/Procedures: Remote monitoring is used to monitor your Pacemaker of ICD from home. This monitoring reduces the number of office visits required to check your device to one time per year. It allows [place] to keep an eye on the functioning of your device to ensure it is working properly. You are scheduled for a device check from home on Monday, February 17th, 2020. You may send your transmission at any time that day. If you have a wireless device, the transmission will be sent automatically. After your physician reviews your transmission, you will receive a postcard with your next transmission date.  To improve our patient care and to more adequately follow your device, [facility] has decided, as a practice, to start following each patient four times a year with your home monitor. This means that you may experience a remote appointment that is close to an in-office appointment with your physician. Your insurance will apply at the same rate as other remote monitoring transmissions.  Follow-Up: At [facility], you and your health needs are our priority.  As part of our continuing mission to provide you with exceptional heart care, we have created designated Provider Care Teams.  These Care Teams include your primary Cardiologist (physician) and Advanced Practice Providers (APPs -  Physician Assistants and Nurse Practitioners) who all work together to provide you with the care you need, when you need it. You will need a follow up appointment in 6 months.  Please call our office 2 months in advance to schedule this appointment.  You may see Sanda Klein, MD or one of the following Advanced Practice Providers on your  designated Care Team: [place], [place] . Fabian Sharp, PA-C . You will receive a reminder letter in the mail two months in advance. If you don't receive a letter, please call our office to schedule the follow-up appointment.

## 2018-06-20 NOTE — Progress Notes (Signed)
Patient ID: Sara Walker, female   DOB: [birth year], 67 y.o.   MRN: [id number]     Cardiology Office Note    Date:  06/20/2018   ID:  Sara Walker, DOB [birth year], MRN [id number]  PCP:  Deland Pretty, MD  Cardiologist:   Sanda Klein, MD   Chief Complaint  Patient presents with  . Follow-up    CHF    History of Present Illness:  Sara Walker is a 66 y.o. female who presents for severe nonischemic cardiomyopathy and advanced (stage D) combined systolic and diastolic heart failure, s/p CRT-D, history of paroxysmal atrial fibrillation and polymorphic VT requiring ICD shocks (in the setting of hypokalemia). She has a left bundle branch block and showed substantial improvement in clinical status after cardiac resynchronization therapy. However, LVEF remains 20-25 %. She has been intolerant of ACE inhibitors, ARB's and Entresto.  This is her first visit since her defibrillator generator change.  She states that changing out the battery lead to functional improvement, even though did not change her programming much (subtle adjustments of AV delay performed).    Device function is normal.  She has greater than 99% biventricular pacing and there has been no recorded atrial fibrillation or ventricular tachyarrhythmia.  She has not required VT/VF therapies since 2016 when she had her signs in the setting of severe electrolyte abnormalities.    She has always had a relatively high left ventricular lead pacing thresholds (today 3.25 V at 1.0 ms, M3-RV coil) all the other lead parameters are excellent.  Generator longevity is estimated to be approximately 5 years at current settings.  She has not had atrial fibrillation in years.  She stopped Xarelto, primarily due to its cost, refuses to take warfarin.  We are using her device to warn [place] whether she has atrial fibrillation.   [place]vue has been stable recently with exception of a brief dip attributable to Thanksgiving.  Now back to baseline.  The  patient specifically denies any chest pain at rest exertion, dyspnea at rest or with exertion, orthopnea, paroxysmal nocturnal dyspnea, syncope, palpitations, focal neurological deficits, intermittent claudication, lower extremity edema, unexplained weight gain, cough, hemoptysis or wheezing.  No recent gout attacks.  NYHA functional class I-2 dyspnea on exertion.  She reports that her recent labs showed that "the potassium was borderline low and the calcium was borderline high".  It was recommended that she double her potassium dose which she did for a few days.  This made her feel bad so she has come back to her usual dose.  There was also recommendation to recheck ionized calcium, but she is very protective of her peripheral veins since she is such a hard stick.  She has not had a repeat labs performed.  She takes metolazone intermittently for the fluid retention, no more than once a week.  She is not sure if her blood might of been drawn right after her metolazone dose.  After years of chemotherapy she has very difficult venous access, to the point that she has some degree of PTSD.    Past Medical History:  Diagnosis Date  . Angina   . Automatic implantable cardioverter-defibrillator in situ   . Breast cancer ([place])   . Cardiomyopathy secondary to chemotherapy [facility])    a.  doxurubicin;  b. s/p SJM Quadra Assura CRT-D, model [id number], Ser# [id number]  . CHF (congestive heart failure) ([place])   . Chronic systolic heart failure ([place])   . Coronary artery disease   .  DM2 (diabetes mellitus, type 2) ([place])   . Goiter   . Gout   . Hyperlipidemia   . Hypertension   . Invasive ductal carcinoma of breast ([place])   . LBBB (left bundle branch block)    evidnece of RBBB fall 2012  . 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 Pacemaker   . PONV (postoperative nausea and vomiting)    nausea post ICD placement relieved with Zofran  . Shortness of breath   . Syncope     Past Surgical  History:  Procedure Laterality Date  . BI-VENTRICULAR IMPLANTABLE CARDIOVERTER DEFIBRILLATOR N/A 12/12/2011   Procedure: BI-VENTRICULAR IMPLANTABLE CARDIOVERTER DEFIBRILLATOR  (CRT-D);  Surgeon: Deboraha Sprang, MD;  Location: [facility] CATH LAB;  Service: Cardiovascular;  Laterality: N/A;  . BIV ICD GENERATOR CHANGEOUT N/A 02/23/2018   Procedure: BIV ICD GENERATOR CHANGEOUT;  Surgeon: Sanda Klein, MD;  Location: [place] CV LAB;  Service: Cardiovascular;  Laterality: N/A;  . BREAST SURGERY     Left mastectomy  . CARDIAC DEFIBRILLATOR PLACEMENT     [employer]  . [place]  . DILATION AND CURETTAGE OF UTERUS  1975  . FOOT SURGERY  1994  . MASTECTOMY     right  . NM MYOCAR PERF WALL MOTION  12/01/2011   no ischemia; EF 22%  . [place]  . TUNNELED VENOUS CATHETER PLACEMENT     removed    Outpatient Medications Prior to Visit  Medication Sig Dispense Refill  . allopurinol (ZYLOPRIM) 300 MG tablet Take 450 mg by mouth daily.     . carvedilol (COREG) 25 MG tablet Take 1 tablet (25 mg total) by mouth 2 (two) times daily. 180 tablet 3  . Cholecalciferol (VITAMIN D) 2000 units CAPS Take 4,000 Units by mouth daily.     . colchicine 0.6 MG tablet Take 0.6 mg by mouth daily as needed (for gout).     . Lutein 20 MG CAPS Take 20 mg by mouth daily.     . magnesium oxide (MAG-OX) 400 (241.3 Mg) MG tablet TAKE 2 TABLETS BY MOUTH ALTERNATING WITH 1 TABLET DAILY (Patient taking differently: Take 400-800 mg by mouth See admin instructions. Take 800 mg by mouth on even days alternating with 400 mg by mouth on odd days) 135 tablet 3  . metolazone (ZAROXOLYN) 5 MG tablet TAKE ONE TABLET BY MOUTH ONCE DAILY AS NEEDED (TAKE  IF  WEIGHT  IS  OVER  255  POUNDS) (Patient taking differently: Take 5 mg by mouth daily as needed (if weight > 255). ) 90 tablet 1  . NOVOLIN R 100 UNIT/ML injection 200 Units See admin instructions. Max 200 units via insulin pump  6  . potassium  chloride SA (K-DUR,KLOR-CON) 20 MEQ tablet TAKE 1 TABLET BY MOUTH ONCE DAILY (TAKE AN EXTRA 40 MEQ WHEN YOU TAKE METOLAZONE) (Patient taking differently: Take 20-40 mEq by mouth See admin instructions. Take 40 meq by mouth on even days alternating with 20 meq by mouth on odd days) 180 tablet 3  . spironolactone (ALDACTONE) 50 MG tablet TAKE 1 TABLET BY MOUTH ONCE DAILY (Patient taking differently: Take 50 mg by mouth daily. ) 90 tablet 2  . torsemide (DEMADEX) 100 MG tablet TAKE 1 TABLET BY MOUTH ONCE DAILY (Patient taking differently: Take 50 mg by mouth See admin instructions. Take 50 mg by mouth at 1200 and take 50 mg by mouth at 1400) 90 tablet 3  . carvedilol (COREG)  25 MG tablet TAKE 1 TABLET BY MOUTH TWICE DAILY 180 tablet 1  . LORazepam (ATIVAN) 1 MG tablet Take 1 tablet (1 mg total) by mouth as needed for anxiety. (Patient taking differently: Take 1 mg by mouth 2 (two) times daily as needed (for anxiety for procedure). ) 4 tablet 0   No facility-administered medications prior to visit.      Allergies:   Lasix [furosemide]; Ace inhibitors; Codeine; Digitalis; Levaquin [levofloxacin in d5w]; Other; Statins; and Zyrtec [cetirizine]   Social History   Socioeconomic History  . Marital status: Divorced    Spouse name: Not on file  . Number of children: Not on file  . Years of education: Not on file  . Highest education level: Not on file  Occupational History  . Not on file  Social Needs  . Financial resource strain: Not on file  . Food insecurity:    Worry: Not on file    Inability: Not on file  . Transportation needs:    Medical: Not on file    Non-medical: Not on file  Tobacco Use  . Smoking status: Former Research scientist (life sciences)  . Smokeless tobacco: Never Used  Substance and Sexual Activity  . Alcohol use: No    Alcohol/week: 0.0 standard drinks  . Drug use: No  . Sexual activity: Not Currently  Lifestyle  . Physical activity:    Days per week: Not on file    Minutes per session: Not  on file  . Stress: Not on file  Relationships  . Social connections:    Talks on phone: Not on file    Gets together: Not on file    Attends religious service: Not on file    Active member of club or organization: Not on file    Attends meetings of clubs or organizations: Not on file    Relationship status: Not on file  Other Topics Concern  . Not on file  Social History Narrative  . Not on file     Family History:  The patient's family history includes Diabetes in her brother; Heart attack in her father; Hypertension in her brother.   ROS:   Please see the history of present illness.    ROS All other systems reviewed and are negative.   PHYSICAL EXAM:   VS:  BP 118/74   Pulse 71   Ht 5\' 8"  (1.727 m)   Wt 250 lb 6.4 oz (113.6 kg)   BMI 38.07 kg/m      General: Alert, oriented x3, no distress, severely obese, healthy subclavian defibrillator site Head: no evidence of trauma, PERRL, EOMI, no exophtalmos or lid lag, no myxedema, no xanthelasma; normal ears, nose and oropharynx Neck: normal jugular venous pulsations and no hepatojugular reflux; brisk carotid pulses without delay and no carotid bruits Chest: clear to auscultation, no signs of consolidation by percussion or palpation, normal fremitus, symmetrical and full respiratory excursions Cardiovascular: normal position and quality of the apical impulse, regular rhythm, normal first and second heart sounds, no murmurs, rubs or gallops Abdomen: no tenderness or distention, no masses by palpation, no abnormal pulsatility or arterial bruits, normal bowel sounds, no hepatosplenomegaly Extremities: no clubbing, cyanosis or edema; 2+ radial, ulnar and brachial pulses bilaterally; 2+ right femoral, posterior tibial and dorsalis pedis pulses; 2+ left femoral, posterior tibial and dorsalis pedis pulses; no subclavian or femoral bruits Neurological: grossly nonfocal Psych: Normal mood and affect    Wt Readings from Last 3  Encounters:  06/20/18 250 lb 6.4  oz (113.6 kg)  02/23/18 250 lb (113.4 kg)  01/25/18 251 lb 6.4 oz (114 kg)      Studies/Labs Reviewed:   EKG:  EKG is ordered today.  It shows atrial paced, biventricular paced rhythm with small positive R waves in lead V1 V2.  The QRS remains rather guarded at 178 ms.  QTc 558 ms.  In July 2019 the QTC was 556 ms and potassium was 3.9 at that time.  LABS  May 2017 Hemoglobin 13.6, BUN 21, creatinine 1.0, potassium 3.8, normal LFTs, uric acid 5.0 Total cholesterol 232, transfers to and 46, HDL 44, LDL 139 TSH 2.31 Hemoglobin A1c 7.4%  ASSESSMENT:    1. Chronic combined systolic and diastolic heart failure ([place])   2. Biventricular ICD (implantable cardioverter-defibrillator) in place   3. VT (ventricular tachycardia) (HCC)   4. Paroxysmal atrial fibrillation ([place])   5. Essential hypertension   6. Class 2 severe obesity with body mass index (BMI) of 35 to 39.9 with serious comorbidity ([place])   7. Diabetes mellitus type 2 in obese ([place])   8. Hyperuricemia   9. Mixed hyperlipidemia    PLAN:  In order of problems listed above:  1. CHF: Clinically euvolemic, NYHA functional class I-II.  She is doing quite well self regulating the dose of diuretics.  If anything she is doing better than she was a year ago.  She never tolerated any type of RAAS inhibitor.  She is on a high dose of carvedilol and she takes spironolactone. 2. CMP: Nonischemic cardiomyopathy with severely depressed left ventricular systolic function that has remained at about 20-25% despite CRRT.  Despite that, she had substantial clinical improvement after CRT pacing was implemented, with a marked reduction in the episodes of heart failure exacerbation or need for hospitalization.  She did not tolerate either ACE inhibitors or ARB due to cough. 3. VT/TdP: In 2016 she had torsade de pointes in the setting of major electrolyte abnormalities and heart failure exacerbation.  None since that  time. 4. AFib: Remarkably, it has been about 3 years since her last episode of atrial fibrillation.  She continues to adamantly opposed the use of anticoagulants.  We will continue to use her device to detect any lengthy episodes of atrial fibrillation that would impose the need to reconsider.  CHADSVasc 5 (age, gender, DM, CHF, HTN). 5. HTN: Excellent control 6. Morbid obesity: She is now in severely obese rather than morbidly obese range.  Her weight has varied broadly over the years, often in response to psychological issues. 7. DM: Fair if not perfect control with last hemoglobin A1c 7.3% in July 2019 8. Gout: No recent attacks 9. HLP: She did not have any coronary disease on angiography.  Ideally she would be on lipid-lowering therapy since he has diabetes mellitus, but she has been intolerant to multiple statins.  We offered PCSK9 inhibitors but she declined due to cost.    Medication Adjustments/Labs and Tests Ordered: Current medicines are reviewed at length with the patient today.  Concerns regarding medicines are outlined above.  Medication changes, Labs and Tests ordered today are listed in the Patient Instructions below. Patient Instructions  Medication Instructions:  Dr Sallyanne Kuster recommends that you continue on your current medications as directed. Please refer to the Current Medication list given to you today.  If you need a refill on your cardiac medications before your next appointment, please call your pharmacy.   Testing/Procedures: Remote monitoring is used to monitor your Pacemaker of ICD from  home. This monitoring reduces the number of office visits required to check your device to one time per year. It allows [place] to keep an eye on the functioning of your device to ensure it is working properly. You are scheduled for a device check from home on Monday, February 17th, 2020. You may send your transmission at any time that day. If you have a wireless device, the transmission will  be sent automatically. After your physician reviews your transmission, you will receive a postcard with your next transmission date.  To improve our patient care and to more adequately follow your device, [facility] has decided, as a practice, to start following each patient four times a year with your home monitor. This means that you may experience a remote appointment that is close to an in-office appointment with your physician. Your insurance will apply at the same rate as other remote monitoring transmissions.  Follow-Up: At [facility], you and your health needs are our priority.  As part of our continuing mission to provide you with exceptional heart care, we have created designated Provider Care Teams.  These Care Teams include your primary Cardiologist (physician) and Advanced Practice Providers (APPs -  Physician Assistants and Nurse Practitioners) who all work together to provide you with the care you need, when you need it. You will need a follow up appointment in 6 months.  Please call our office 2 months in advance to schedule this appointment.  You may see Sanda Klein, MD or one of the following Advanced Practice Providers on your designated Care Team: [place], [place] . Fabian Sharp, PA-C . You will receive a reminder letter in the mail two months in advance. If you don't receive a letter, please call our office to schedule the follow-up appointment.      Signed, Sanda Klein, MD  06/20/2018 4:00 PM    [address], [place], [place]  [address] Phone: [phone]; Fax: [phone]

## 2018-07-24 LAB — CUP PACEART REMOTE DEVICE CHECK
Battery Remaining Percentage: 94 %
Battery Voltage: 3.19 V
Brady Statistic AS VP Percent: 99 %
HIGH POWER IMPEDANCE MEASURED VALUE: 93 Ohm
HIGH POWER IMPEDANCE MEASURED VALUE: 93 Ohm
Implantable Lead Implant Date: 20130603
Implantable Lead Implant Date: 20130603
Implantable Lead Location: 753858
Implantable Lead Location: 753859
Implantable Lead Location: 753860
Implantable Pulse Generator Implant Date: 20190816
Lead Channel Impedance Value: 590 Ohm
Lead Channel Impedance Value: 760 Ohm
Lead Channel Pacing Threshold Amplitude: 0.75 V
Lead Channel Pacing Threshold Pulse Width: 0.5 ms
Lead Channel Sensing Intrinsic Amplitude: 12 mV
Lead Channel Setting Pacing Amplitude: 2 V
Lead Channel Setting Pacing Amplitude: 3.75 V
Lead Channel Setting Pacing Pulse Width: 1 ms
Lead Channel Setting Sensing Sensitivity: 0.5 mV
MDC IDC LEAD IMPLANT DT: 20130603
MDC IDC MSMT BATTERY REMAINING LONGEVITY: 59 mo
MDC IDC MSMT LEADCHNL LV PACING THRESHOLD AMPLITUDE: 3 V
MDC IDC MSMT LEADCHNL LV PACING THRESHOLD PULSEWIDTH: 1 ms
MDC IDC MSMT LEADCHNL RA IMPEDANCE VALUE: 330 Ohm
MDC IDC MSMT LEADCHNL RA SENSING INTR AMPL: 2.4 mV
MDC IDC MSMT LEADCHNL RV PACING THRESHOLD AMPLITUDE: 0.75 V
MDC IDC MSMT LEADCHNL RV PACING THRESHOLD PULSEWIDTH: 0.5 ms
MDC IDC SESS DTM: 20191118102326
MDC IDC SET LEADCHNL RV PACING AMPLITUDE: 2 V
MDC IDC SET LEADCHNL RV PACING PULSEWIDTH: 0.5 ms
MDC IDC STAT BRADY AP VP PERCENT: 1 %
MDC IDC STAT BRADY AP VS PERCENT: 1 %
MDC IDC STAT BRADY AS VS PERCENT: 1 %
MDC IDC STAT BRADY RA PERCENT PACED: 1 %
Pulse Gen Serial Number: 9842973

## 2018-07-31 LAB — CUP PACEART INCLINIC DEVICE CHECK
Date Time Interrogation Session: 20200121142258
Implantable Lead Implant Date: 20130603
Implantable Lead Implant Date: 20130603
Implantable Lead Location: 753860
MDC IDC LEAD IMPLANT DT: 20130603
MDC IDC LEAD LOCATION: 753858
MDC IDC LEAD LOCATION: 753859
MDC IDC PG IMPLANT DT: 20190816
MDC IDC PG SERIAL: 9842973

## 2018-08-20 ENCOUNTER — Other Ambulatory Visit: Payer: Self-pay | Admitting: Cardiovascular Disease

## 2018-08-27 ENCOUNTER — Ambulatory Visit (INDEPENDENT_AMBULATORY_CARE_PROVIDER_SITE_OTHER): Payer: Medicare Other

## 2018-08-27 DIAGNOSIS — I428 Other cardiomyopathies: Secondary | ICD-10-CM | POA: Diagnosis not present

## 2018-08-27 DIAGNOSIS — I5022 Chronic systolic (congestive) heart failure: Secondary | ICD-10-CM

## 2018-08-27 LAB — CUP PACEART REMOTE DEVICE CHECK
Battery Remaining Longevity: 53 mo
Battery Remaining Percentage: 89 %
Brady Statistic AS VP Percent: 99 %
Date Time Interrogation Session: 20200217070019
HighPow Impedance: 83 Ohm
HighPow Impedance: 83 Ohm
Implantable Lead Implant Date: 20130603
Implantable Lead Implant Date: 20130603
Implantable Pulse Generator Implant Date: 20190816
Lead Channel Impedance Value: 310 Ohm
Lead Channel Pacing Threshold Amplitude: 0.75 V
Lead Channel Pacing Threshold Amplitude: 0.75 V
Lead Channel Sensing Intrinsic Amplitude: 2.9 mV
Lead Channel Setting Pacing Amplitude: 2 V
Lead Channel Setting Pacing Pulse Width: 0.5 ms
Lead Channel Setting Pacing Pulse Width: 1 ms
MDC IDC LEAD IMPLANT DT: 20130603
MDC IDC LEAD LOCATION: 753858
MDC IDC LEAD LOCATION: 753859
MDC IDC LEAD LOCATION: 753860
MDC IDC MSMT BATTERY VOLTAGE: 3.08 V
MDC IDC MSMT LEADCHNL LV IMPEDANCE VALUE: 660 Ohm
MDC IDC MSMT LEADCHNL LV PACING THRESHOLD AMPLITUDE: 2.75 V
MDC IDC MSMT LEADCHNL LV PACING THRESHOLD PULSEWIDTH: 1 ms
MDC IDC MSMT LEADCHNL RA PACING THRESHOLD PULSEWIDTH: 0.5 ms
MDC IDC MSMT LEADCHNL RV IMPEDANCE VALUE: 550 Ohm
MDC IDC MSMT LEADCHNL RV PACING THRESHOLD PULSEWIDTH: 0.5 ms
MDC IDC MSMT LEADCHNL RV SENSING INTR AMPL: 12 mV
MDC IDC SET LEADCHNL LV PACING AMPLITUDE: 3.75 V
MDC IDC SET LEADCHNL RV PACING AMPLITUDE: 2 V
MDC IDC SET LEADCHNL RV SENSING SENSITIVITY: 0.5 mV
MDC IDC STAT BRADY AP VP PERCENT: 1 %
MDC IDC STAT BRADY AP VS PERCENT: 1 %
MDC IDC STAT BRADY AS VS PERCENT: 1 %
MDC IDC STAT BRADY RA PERCENT PACED: 1 %
Pulse Gen Serial Number: 9842973

## 2018-09-05 ENCOUNTER — Encounter: Payer: Self-pay | Admitting: Cardiovascular Disease

## 2018-09-05 DIAGNOSIS — E1165 Type 2 diabetes mellitus with hyperglycemia: Secondary | ICD-10-CM | POA: Diagnosis not present

## 2018-09-05 NOTE — Progress Notes (Signed)
Remote ICD transmission.   

## 2018-09-12 DIAGNOSIS — E785 Hyperlipidemia, unspecified: Secondary | ICD-10-CM | POA: Diagnosis not present

## 2018-09-12 DIAGNOSIS — I10 Essential (primary) hypertension: Secondary | ICD-10-CM | POA: Diagnosis not present

## 2018-09-12 DIAGNOSIS — I5022 Chronic systolic (congestive) heart failure: Secondary | ICD-10-CM | POA: Diagnosis not present

## 2018-09-12 DIAGNOSIS — E871 Hypo-osmolality and hyponatremia: Secondary | ICD-10-CM | POA: Diagnosis not present

## 2018-09-12 DIAGNOSIS — I4891 Unspecified atrial fibrillation: Secondary | ICD-10-CM | POA: Diagnosis not present

## 2018-09-12 DIAGNOSIS — E1165 Type 2 diabetes mellitus with hyperglycemia: Secondary | ICD-10-CM | POA: Diagnosis not present

## 2018-09-18 DIAGNOSIS — E1142 Type 2 diabetes mellitus with diabetic polyneuropathy: Secondary | ICD-10-CM | POA: Diagnosis not present

## 2018-09-18 DIAGNOSIS — B351 Tinea unguium: Secondary | ICD-10-CM | POA: Diagnosis not present

## 2018-09-18 DIAGNOSIS — M79674 Pain in right toe(s): Secondary | ICD-10-CM | POA: Diagnosis not present

## 2018-09-18 DIAGNOSIS — M79675 Pain in left toe(s): Secondary | ICD-10-CM | POA: Diagnosis not present

## 2018-10-08 ENCOUNTER — Other Ambulatory Visit: Payer: Self-pay | Admitting: Cardiovascular Disease

## 2018-10-08 ENCOUNTER — Other Ambulatory Visit: Payer: Self-pay

## 2018-10-08 MED ORDER — CARVEDILOL 25 MG PO TABS: 25.0000 mg | ORAL_TABLET | Freq: Two times a day (BID) | ORAL | 3 refills | Status: DC

## 2018-10-09 ENCOUNTER — Other Ambulatory Visit: Payer: Self-pay | Admitting: Cardiovascular Disease

## 2018-10-10 ENCOUNTER — Other Ambulatory Visit: Payer: Self-pay

## 2018-10-10 MED ORDER — CARVEDILOL 25 MG PO TABS: 25.0000 mg | ORAL_TABLET | Freq: Two times a day (BID) | ORAL | 3 refills | Status: AC

## 2018-11-26 ENCOUNTER — Other Ambulatory Visit: Payer: Self-pay

## 2018-11-26 ENCOUNTER — Ambulatory Visit (INDEPENDENT_AMBULATORY_CARE_PROVIDER_SITE_OTHER): Payer: Medicare Other | Admitting: *Deleted

## 2018-11-26 DIAGNOSIS — I428 Other cardiomyopathies: Secondary | ICD-10-CM

## 2018-11-27 LAB — CUP PACEART REMOTE DEVICE CHECK
Date Time Interrogation Session: 20200519073215
Implantable Lead Implant Date: 20130603
Implantable Lead Implant Date: 20130603
Implantable Lead Implant Date: 20130603
Implantable Lead Location: 753858
Implantable Lead Location: 753859
Implantable Lead Location: 753860
Implantable Pulse Generator Implant Date: 20190816
Pulse Gen Serial Number: 9842973

## 2018-12-05 ENCOUNTER — Encounter: Payer: Self-pay | Admitting: Cardiology

## 2018-12-05 NOTE — Progress Notes (Signed)
Remote ICD transmission.   

## 2018-12-11 DIAGNOSIS — I10 Essential (primary) hypertension: Secondary | ICD-10-CM | POA: Diagnosis not present

## 2018-12-11 DIAGNOSIS — E785 Hyperlipidemia, unspecified: Secondary | ICD-10-CM | POA: Diagnosis not present

## 2018-12-11 DIAGNOSIS — E1165 Type 2 diabetes mellitus with hyperglycemia: Secondary | ICD-10-CM | POA: Diagnosis not present

## 2018-12-11 DIAGNOSIS — M109 Gout, unspecified: Secondary | ICD-10-CM | POA: Diagnosis not present

## 2018-12-13 DIAGNOSIS — N390 Urinary tract infection, site not specified: Secondary | ICD-10-CM | POA: Diagnosis not present

## 2018-12-13 DIAGNOSIS — I5022 Chronic systolic (congestive) heart failure: Secondary | ICD-10-CM | POA: Diagnosis not present

## 2018-12-13 DIAGNOSIS — E1165 Type 2 diabetes mellitus with hyperglycemia: Secondary | ICD-10-CM | POA: Diagnosis not present

## 2018-12-13 DIAGNOSIS — E785 Hyperlipidemia, unspecified: Secondary | ICD-10-CM | POA: Diagnosis not present

## 2018-12-13 DIAGNOSIS — E876 Hypokalemia: Secondary | ICD-10-CM | POA: Diagnosis not present

## 2018-12-13 DIAGNOSIS — I10 Essential (primary) hypertension: Secondary | ICD-10-CM | POA: Diagnosis not present

## 2018-12-13 DIAGNOSIS — I4891 Unspecified atrial fibrillation: Secondary | ICD-10-CM | POA: Diagnosis not present

## 2018-12-27 ENCOUNTER — Telehealth: Payer: Self-pay | Admitting: *Deleted

## 2018-12-27 NOTE — Telephone Encounter (Signed)
The patient has been called to see if she would like to do a virtual visit on 6/29 with Dr. Sallyanne Kuster or to be seen in the office.  She would prefer an in office appointment. She has been informed to come alone and to bring a mask.      COVID-19 Pre-Screening Questions:  . In the past 7 to 10 days have you had a cough,  shortness of breath, headache, congestion, fever (100 or greater) body aches, chills, sore throat, or sudden loss of taste or sense of smell? No . Have you been around anyone with known Covid 19. No . Have you been around anyone who is awaiting Covid 19 test results in the past 7 to 10 days? No . Have you been around anyone who has been exposed to Covid 19, or has mentioned symptoms of Covid 19 within the past 7 to 10 days? No  If you have any concerns/questions about symptoms patients report during screening (either on the phone or at threshold). Contact the provider seeing the patient or DOD for further guidance.  If neither are available contact a member of the leadership team.

## 2019-01-01 DIAGNOSIS — B351 Tinea unguium: Secondary | ICD-10-CM | POA: Diagnosis not present

## 2019-01-01 DIAGNOSIS — M79674 Pain in right toe(s): Secondary | ICD-10-CM | POA: Diagnosis not present

## 2019-01-01 DIAGNOSIS — M79675 Pain in left toe(s): Secondary | ICD-10-CM | POA: Diagnosis not present

## 2019-01-07 ENCOUNTER — Encounter: Payer: Self-pay | Admitting: Cardiovascular Disease

## 2019-01-07 ENCOUNTER — Ambulatory Visit (INDEPENDENT_AMBULATORY_CARE_PROVIDER_SITE_OTHER): Payer: Medicare Other | Admitting: Cardiovascular Disease

## 2019-01-07 ENCOUNTER — Other Ambulatory Visit: Payer: Self-pay

## 2019-01-07 VITALS — BP 132/77 | HR 66 | Temp 97.0°F | Ht 68.0 in | Wt 252.6 lb

## 2019-01-07 DIAGNOSIS — I5042 Chronic combined systolic (congestive) and diastolic (congestive) heart failure: Secondary | ICD-10-CM | POA: Diagnosis not present

## 2019-01-07 MED ORDER — RIVAROXABAN 20 MG PO TABS: 20.0000 mg | ORAL_TABLET | Freq: Every day | ORAL | 11 refills | Status: DC

## 2019-01-07 MED ORDER — POTASSIUM CHLORIDE CRYS ER 20 MEQ PO TBCR: 40.0000 meq | EXTENDED_RELEASE_TABLET | Freq: Every day | ORAL | 3 refills | Status: DC

## 2019-01-07 NOTE — Patient Instructions (Addendum)
Medication Instructions:  START Xarelto 20 mg once daily INCREASE the Potassium to 40 mEq once daily  If you need a refill on your cardiac medications before your next appointment, please call your pharmacy.   Lab work: None ordered  Testing/Procedures: None ordered  Follow-Up: At [facility], you and your health needs are our priority.  As part of our continuing mission to provide you with exceptional heart care, we have created designated Provider Care Teams.  These Care Teams include your primary Cardiologist (physician) and Advanced Practice Providers (APPs -  Physician Assistants and Nurse Practitioners) who all work together to provide you with the care you need, when you need it. You will need a follow up appointment in 12 months.  Please call our office 2 months in advance to schedule this appointment.  You may see Sanda Klein, MD or one of the following Advanced Practice Providers on your designated Care Team: [place], [place] . Fabian Sharp, PA-C

## 2019-01-07 NOTE — Progress Notes (Signed)
Patient ID: Sara Walker, female   DOB: Nov 30, 1950, 68 y.o.   MRN: [id number]     Cardiology Office Note    Date:  01/07/2019   ID:  Sara Walker, DOB May 20, 1951, MRN [id number]  PCP:  Deland Pretty, MD  Cardiologist:   Sanda Klein, MD   Chief Complaint  Patient presents with  . Congestive Heart Failure  . Atrial Fibrillation    History of Present Illness:  Sara Walker is a 68 y.o. female who presents for severe nonischemic cardiomyopathy and advanced (stage D) combined systolic and diastolic heart failure, s/p CRT-D (generator change August 2019, [place]), history of paroxysmal atrial fibrillation and polymorphic VT requiring ICD shocks (in the setting of hypokalemia). She has a left bundle branch block and showed substantial improvement in clinical status after cardiac resynchronization therapy. However, LVEF remains 20-25 %. She has been intolerant of ACE inhibitors, ARB's and Entresto.  Since her last appointment she is generally done well but does have some issues with less pressure as well as worsening dyspnea and fatigue in early June.  This coincided with her period.  There is evidence of hypervolemia judging by the sudden drop in thoracic impedance, which has now returned to baseline.  She did not have any edema at the time, but did take an extra dose of metolazone.  Labs performed on June 2 show potassium of 3.4 BUN 42, creatinine 1.5.  Comprehensive defibrillator testing including threshold testing is performed today.  Device function is normal and battery generator longevity is estimated at 4.1 years.  She has 99% biventricular pacing and only 2% atrial pacing.  She has not required VT/VF therapies since 2016 when they occurred in the setting of severe electrolyte abnormalities.    On the other hand recently she has began having more episodes of atrial fibrillation (5-hour episode April 3, 6-hour episode Nov 09, 2010 hour episode June 5) none of them with  rapid ventricular response.  She has not been taking Xarelto for many years, after stopping due to excessive cost.  She refuses to take warfarin.  She has always had a relatively high left ventricular lead pacing thresholds (today 2.25 V at 1.0 ms, M3-RV coil) all the other lead parameters are excellent.    After years of chemotherapy she has very difficult venous access, to the point that she has some degree of PTSD.  She has often refused to have repeat labs drawn because of this.  She has not yet had an ionized calcium checked.  Past Medical History:  Diagnosis Date  . Angina   . Automatic implantable cardioverter-defibrillator in situ   . Breast cancer ([place])   . Cardiomyopathy secondary to chemotherapy [facility])    a.  doxurubicin;  b. s/p SJM Quadra Assura CRT-D, model [id number], Ser# [id number]  . CHF (congestive heart failure) ([place])   . Chronic systolic heart failure ([place])   . Coronary artery disease   . DM2 (diabetes mellitus, type 2) ([place])   . Goiter   . Gout   . Hyperlipidemia   . Hypertension   . Invasive ductal carcinoma of breast ([place])   . LBBB (left bundle branch block)    evidnece of RBBB fall 2012  . 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 Pacemaker   . PONV (postoperative nausea and vomiting)    nausea post ICD placement relieved with Zofran  . Shortness of breath   . Syncope     Past Surgical  History:  Procedure Laterality Date  . BI-VENTRICULAR IMPLANTABLE CARDIOVERTER DEFIBRILLATOR N/A 12/12/2011   Procedure: BI-VENTRICULAR IMPLANTABLE CARDIOVERTER DEFIBRILLATOR  (CRT-D);  Surgeon: Deboraha Sprang, MD;  Location: [facility] CATH LAB;  Service: Cardiovascular;  Laterality: N/A;  . BIV ICD GENERATOR CHANGEOUT N/A 02/23/2018   Procedure: BIV ICD GENERATOR CHANGEOUT;  Surgeon: Sanda Klein, MD;  Location: [place] CV LAB;  Service: Cardiovascular;  Laterality: N/A;  . BREAST SURGERY     Left mastectomy  . CARDIAC DEFIBRILLATOR PLACEMENT     [place]  [facility]  . [place]  . DILATION AND CURETTAGE OF UTERUS  1975  . FOOT SURGERY  1994  . MASTECTOMY     right  . NM MYOCAR PERF WALL MOTION  12/01/2011   no ischemia; EF 22%  . [place]  . TUNNELED VENOUS CATHETER PLACEMENT     removed    Outpatient Medications Prior to Visit  Medication Sig Dispense Refill  . allopurinol (ZYLOPRIM) 300 MG tablet Take 450 mg by mouth daily.     . carvedilol (COREG) 25 MG tablet Take 1 tablet (25 mg total) by mouth 2 (two) times daily. 180 tablet 3  . Cholecalciferol (VITAMIN D) 2000 units CAPS Take 4,000 Units by mouth daily.     . colchicine 0.6 MG tablet Take 0.6 mg by mouth daily as needed (for gout).     . Lutein 20 MG CAPS Take 20 mg by mouth daily.     . magnesium oxide (MAG-OX) 400 (241.3 Mg) MG tablet Take 1-2 tablets (400-800 mg total) by mouth See admin instructions. Take 800 mg by mouth on even days alternating with 400 mg by mouth on odd days 135 tablet 3  . metolazone (ZAROXOLYN) 5 MG tablet TAKE ONE TABLET BY MOUTH ONCE DAILY AS NEEDED (TAKE  IF  WEIGHT  IS  OVER  255  POUNDS) (Patient taking differently: Take 5 mg by mouth daily as needed (if weight > 255). ) 90 tablet 1  . NOVOLIN R 100 UNIT/ML injection 200 Units See admin instructions. Max 200 units via insulin pump  6  . potassium chloride SA (K-DUR,KLOR-CON) 20 MEQ tablet TAKE 1 TABLET BY MOUTH ONCE DAILY (TAKE AN EXTRA 40 MEQ WHEN YOU TAKE METOLAZONE) (Patient taking differently: Take 20-40 mEq by mouth See admin instructions. Take 40 meq by mouth on even days alternating with 20 meq by mouth on odd days) 180 tablet 3  . spironolactone (ALDACTONE) 50 MG tablet Take 1 tablet by mouth once daily 90 tablet 2  . torsemide (DEMADEX) 100 MG tablet TAKE 1 TABLET BY MOUTH ONCE DAILY (Patient taking differently: Take 50 mg by mouth See admin instructions. Take 50 mg by mouth at 1200 and take 50 mg by mouth at 1400) 90 tablet 3   No facility-administered  medications prior to visit.      Allergies:   Lasix [furosemide], Ace inhibitors, Codeine, Digitalis, Levaquin [levofloxacin in d5w], Other, Statins, and Zyrtec [cetirizine]   Social History   Socioeconomic History  . Marital status: Divorced    Spouse name: Not on file  . Number of children: Not on file  . Years of education: Not on file  . Highest education level: Not on file  Occupational History  . Not on file  Social Needs  . Financial resource strain: Not on file  . Food insecurity    Worry: Not on file    Inability: Not on file  . Transportation  needs    Medical: Not on file    Non-medical: Not on file  Tobacco Use  . Smoking status: Former Research scientist (life sciences)  . Smokeless tobacco: Never Used  Substance and Sexual Activity  . Alcohol use: No    Alcohol/week: 0.0 standard drinks  . Drug use: No  . Sexual activity: Not Currently  Lifestyle  . Physical activity    Days per week: Not on file    Minutes per session: Not on file  . Stress: Not on file  Relationships  . Social Herbalist on phone: Not on file    Gets together: Not on file    Attends religious service: Not on file    Active member of club or organization: Not on file    Attends meetings of clubs or organizations: Not on file    Relationship status: Not on file  Other Topics Concern  . Not on file  Social History Narrative  . Not on file     Family History:  The patient's family history includes Diabetes in her brother; Heart attack in her father; Hypertension in her brother.   ROS:   Please see the history of present illness.    ROS All other systems reviewed and are negative.   PHYSICAL EXAM:   VS:  BP 132/77   Pulse 66   Temp (!) 97 F (36.1 C)   Ht 5\' 8"  (1.727 m)   Wt 252 lb 9.6 oz (114.6 kg)   SpO2 94%   BMI 38.41 kg/m      General: Alert, oriented x3, no distress, severely obese Head: no evidence of trauma, PERRL, EOMI, no exophtalmos or lid lag, no myxedema, no xanthelasma;  normal ears, nose and oropharynx Neck: normal jugular venous pulsations and no hepatojugular reflux; brisk carotid pulses without delay and no carotid bruits Chest: clear to auscultation, no signs of consolidation by percussion or palpation, normal fremitus, symmetrical and full respiratory excursions.  Healthy left subclavian ICD site. Cardiovascular: normal position and quality of the apical impulse, regular rhythm, normal first and second heart sounds, no murmurs, rubs or gallops Abdomen: no tenderness or distention, no masses by palpation, no abnormal pulsatility or arterial bruits, normal bowel sounds, no hepatosplenomegaly Extremities: no clubbing, cyanosis or edema; 2+ radial, ulnar and brachial pulses bilaterally; 2+ right femoral, posterior tibial and dorsalis pedis pulses; 2+ left femoral, posterior tibial and dorsalis pedis pulses; no subclavian or femoral bruits Neurological: grossly nonfocal Psych: Normal mood and affect     Wt Readings from Last 3 Encounters:  01/07/19 252 lb 9.6 oz (114.6 kg)  06/20/18 250 lb 6.4 oz (113.6 kg)  02/23/18 250 lb (113.4 kg)      Studies/Labs Reviewed:   EKG:  EKG is ordered today.  It shows atrial sensed, biventricular paced rhythm with a prominent positive R waves in lead V1, QRS 178 ms, QTC 544 ms (shorter than in the past).  LABS  December 11, 2018 BUN 42, creatinine 1.5, potassium 3.4  hemoglobin A1c 6.8%  total cholesterol 251, triglycerides 176, HDL 39, LDL 177 Uric acid 6.6, calcium 11.1, normal liver function tests  ASSESSMENT:    1. Chronic combined systolic and diastolic heart failure (HCC)    PLAN:  In order of problems listed above:  1. CHF: He is euvolemic both clinically and by thoracic impedance measurements.  She did have fluid overload about 3 weeks ago but this is resolved.  She is receiving carvedilol and spironolactone but has  never tolerated therapy with any other RAAS inhibitors due to cough (including with ARB).   Increase the potassium to 40 mEq daily on every day basis. 2. CMP: Despite the absence of meaningful change in LVEF she had substantial clinical improvement with CRT.  Cardiomyopathy is likely post chemotherapy.  Nonischemic cardiomyopathy with severely depressed left ventricular systolic function that has remained at about 20-25% despite CRRT.   3. VT/TdP: In 2016 she had torsade de pointes in the setting of major electrolyte abnormalities and heart failure exacerbation.  None since that time. 4. AFib: Although she had a period of roughly 4 years without atrial fibrillation recurrence, she has now had 3 episodes of atrial fibrillation in the last 3 months lasting between 5 and 12 hours.  I recommended that we restart treatment with an anticoagulant.  We will try to obtain manufacturer financial assistance for Xarelto.  CHADSVasc 5 (age, gender, DM, CHF, HTN). 5. HTN: Excellent control 6. Morbid obesity: She is now only in the severely obese range but with multiple comorbid conditions.  Her weight has varied broadly over the years, often paralleling changes in her psychological status. 7. DM: Excellent control 8. Gout: No recent attacks, despite combination therapy with loop and thiazide diuretic. 9. HLP: Markedly elevated LDL cholesterol.  Ideally she would be on lipid-lowering therapy since he has diabetes mellitus, but she has been intolerant to multiple statins.  We offered PCSK9 inhibitors but she declined due to cost.  In the absence of established CAD or PAD, difficult to convince her to take expensive injectable medications.    Medication Adjustments/Labs and Tests Ordered: Current medicines are reviewed at length with the patient today.  Concerns regarding medicines are outlined above.  Medication changes, Labs and Tests ordered today are listed in the Patient Instructions below. Patient Instructions  Medication Instructions:  START Xarelto 20 mg once daily INCREASE the Potassium to 40 mEq  once daily  If you need a refill on your cardiac medications before your next appointment, please call your pharmacy.   Lab work: None ordered  Testing/Procedures: None ordered  Follow-Up: At [facility], you and your health needs are our priority.  As part of our continuing mission to provide you with exceptional heart care, we have created designated Provider Care Teams.  These Care Teams include your primary Cardiologist (physician) and Advanced Practice Providers (APPs -  Physician Assistants and Nurse Practitioners) who all work together to provide you with the care you need, when you need it. You will need a follow up appointment in 12 months.  Please call our office 2 months in advance to schedule this appointment.  You may see Sanda Klein, MD or one of the following Advanced Practice Providers on your designated Care Team: [place], [place] . Fabian Sharp, PA-C       Signed, Sanda Klein, MD  01/07/2019 8:31 AM    [facility] [address], [place], [place]  [address] Phone: [phone]; Fax: [phone]

## 2019-01-08 ENCOUNTER — Telehealth: Payer: Self-pay | Admitting: *Deleted

## 2019-01-08 NOTE — Telephone Encounter (Signed)
Patient left a msg on the refill vm requesting a call back at [phone]. She says that she is confused as she was told that she would get the xarelto for free but when she went to [facility] to pick up the potassium they had refilled the xarelto also. Thanks, MI

## 2019-01-14 DIAGNOSIS — N183 Chronic kidney disease, stage 3 (moderate): Secondary | ICD-10-CM | POA: Diagnosis not present

## 2019-01-14 DIAGNOSIS — M1A09X0 Idiopathic chronic gout, multiple sites, without tophus (tophi): Secondary | ICD-10-CM | POA: Diagnosis not present

## 2019-01-14 DIAGNOSIS — M2550 Pain in unspecified joint: Secondary | ICD-10-CM | POA: Diagnosis not present

## 2019-01-14 DIAGNOSIS — E669 Obesity, unspecified: Secondary | ICD-10-CM | POA: Diagnosis not present

## 2019-01-14 DIAGNOSIS — Z6837 Body mass index (BMI) 37.0-37.9, adult: Secondary | ICD-10-CM | POA: Diagnosis not present

## 2019-01-17 DIAGNOSIS — N183 Chronic kidney disease, stage 3 (moderate): Secondary | ICD-10-CM | POA: Diagnosis not present

## 2019-01-17 DIAGNOSIS — E559 Vitamin D deficiency, unspecified: Secondary | ICD-10-CM | POA: Diagnosis not present

## 2019-01-17 DIAGNOSIS — E7800 Pure hypercholesterolemia, unspecified: Secondary | ICD-10-CM | POA: Diagnosis not present

## 2019-01-17 DIAGNOSIS — E1169 Type 2 diabetes mellitus with other specified complication: Secondary | ICD-10-CM | POA: Diagnosis not present

## 2019-01-17 DIAGNOSIS — I509 Heart failure, unspecified: Secondary | ICD-10-CM | POA: Diagnosis not present

## 2019-01-17 DIAGNOSIS — E785 Hyperlipidemia, unspecified: Secondary | ICD-10-CM | POA: Diagnosis not present

## 2019-01-17 DIAGNOSIS — E876 Hypokalemia: Secondary | ICD-10-CM | POA: Diagnosis not present

## 2019-01-17 DIAGNOSIS — Z95810 Presence of automatic (implantable) cardiac defibrillator: Secondary | ICD-10-CM | POA: Diagnosis not present

## 2019-01-17 DIAGNOSIS — G4733 Obstructive sleep apnea (adult) (pediatric): Secondary | ICD-10-CM | POA: Diagnosis not present

## 2019-01-17 DIAGNOSIS — I4891 Unspecified atrial fibrillation: Secondary | ICD-10-CM | POA: Diagnosis not present

## 2019-01-18 ENCOUNTER — Telehealth: Payer: Self-pay | Admitting: *Deleted

## 2019-01-18 NOTE — Telephone Encounter (Signed)
Patient assistance form faxed for Xarelto 20 mg daily to [place] and [place] at [phone]

## 2019-01-22 DIAGNOSIS — M2041 Other hammer toe(s) (acquired), right foot: Secondary | ICD-10-CM | POA: Diagnosis not present

## 2019-01-22 DIAGNOSIS — E1142 Type 2 diabetes mellitus with diabetic polyneuropathy: Secondary | ICD-10-CM | POA: Diagnosis not present

## 2019-01-22 DIAGNOSIS — Q6671 Congenital pes cavus, right foot: Secondary | ICD-10-CM | POA: Diagnosis not present

## 2019-01-22 DIAGNOSIS — M2042 Other hammer toe(s) (acquired), left foot: Secondary | ICD-10-CM | POA: Diagnosis not present

## 2019-01-23 ENCOUNTER — Other Ambulatory Visit: Payer: Self-pay | Admitting: Cardiovascular Disease

## 2019-02-08 DIAGNOSIS — R109 Unspecified abdominal pain: Secondary | ICD-10-CM | POA: Diagnosis not present

## 2019-02-08 DIAGNOSIS — I10 Essential (primary) hypertension: Secondary | ICD-10-CM | POA: Diagnosis not present

## 2019-02-08 DIAGNOSIS — E1169 Type 2 diabetes mellitus with other specified complication: Secondary | ICD-10-CM | POA: Diagnosis not present

## 2019-02-08 DIAGNOSIS — Z7189 Other specified counseling: Secondary | ICD-10-CM | POA: Diagnosis not present

## 2019-02-08 DIAGNOSIS — R110 Nausea: Secondary | ICD-10-CM | POA: Diagnosis not present

## 2019-02-14 ENCOUNTER — Encounter: Payer: Self-pay | Admitting: Cardiovascular Disease

## 2019-02-14 DIAGNOSIS — Z780 Asymptomatic menopausal state: Secondary | ICD-10-CM | POA: Diagnosis not present

## 2019-02-14 DIAGNOSIS — N950 Postmenopausal bleeding: Secondary | ICD-10-CM | POA: Diagnosis not present

## 2019-02-14 DIAGNOSIS — Z853 Personal history of malignant neoplasm of breast: Secondary | ICD-10-CM | POA: Diagnosis not present

## 2019-02-14 DIAGNOSIS — Z13228 Encounter for screening for other metabolic disorders: Secondary | ICD-10-CM | POA: Diagnosis not present

## 2019-02-14 DIAGNOSIS — Z6839 Body mass index (BMI) 39.0-39.9, adult: Secondary | ICD-10-CM | POA: Diagnosis not present

## 2019-02-14 DIAGNOSIS — Z130 Encounter for screening for diseases of the blood and blood-forming organs and certain disorders involving the immune mechanism: Secondary | ICD-10-CM | POA: Diagnosis not present

## 2019-02-14 DIAGNOSIS — Z01419 Encounter for gynecological examination (general) (routine) without abnormal findings: Secondary | ICD-10-CM | POA: Diagnosis not present

## 2019-02-14 DIAGNOSIS — C50919 Malignant neoplasm of unspecified site of unspecified female breast: Secondary | ICD-10-CM | POA: Insufficient documentation

## 2019-02-20 DIAGNOSIS — D259 Leiomyoma of uterus, unspecified: Secondary | ICD-10-CM | POA: Diagnosis not present

## 2019-02-20 DIAGNOSIS — N858 Other specified noninflammatory disorders of uterus: Secondary | ICD-10-CM | POA: Diagnosis not present

## 2019-02-20 DIAGNOSIS — N950 Postmenopausal bleeding: Secondary | ICD-10-CM | POA: Diagnosis not present

## 2019-02-21 ENCOUNTER — Encounter: Payer: Self-pay | Admitting: Cardiovascular Disease

## 2019-02-21 DIAGNOSIS — Z8679 Personal history of other diseases of the circulatory system: Secondary | ICD-10-CM | POA: Diagnosis not present

## 2019-02-21 DIAGNOSIS — N950 Postmenopausal bleeding: Secondary | ICD-10-CM | POA: Diagnosis not present

## 2019-02-21 DIAGNOSIS — I5022 Chronic systolic (congestive) heart failure: Secondary | ICD-10-CM | POA: Diagnosis not present

## 2019-02-21 DIAGNOSIS — N183 Chronic kidney disease, stage 3 (moderate): Secondary | ICD-10-CM | POA: Diagnosis not present

## 2019-02-21 DIAGNOSIS — E1149 Type 2 diabetes mellitus with other diabetic neurological complication: Secondary | ICD-10-CM | POA: Diagnosis not present

## 2019-02-25 ENCOUNTER — Ambulatory Visit (INDEPENDENT_AMBULATORY_CARE_PROVIDER_SITE_OTHER): Payer: Medicare Other | Admitting: *Deleted

## 2019-02-25 DIAGNOSIS — I428 Other cardiomyopathies: Secondary | ICD-10-CM

## 2019-02-25 LAB — CUP PACEART REMOTE DEVICE CHECK
Battery Remaining Longevity: 47 mo
Battery Remaining Percentage: 80 %
Battery Voltage: 2.98 V
Brady Statistic AP VP Percent: 1.9 %
Brady Statistic AP VS Percent: 1 %
Brady Statistic AS VP Percent: 97 %
Brady Statistic AS VS Percent: 1 %
Brady Statistic RA Percent Paced: 1.8 %
Date Time Interrogation Session: 20200817075721
HighPow Impedance: 75 Ohm
HighPow Impedance: 75 Ohm
Implantable Lead Implant Date: 20130603
Implantable Lead Implant Date: 20130603
Implantable Lead Implant Date: 20130603
Implantable Lead Location: 753858
Implantable Lead Location: 753859
Implantable Lead Location: 753860
Implantable Pulse Generator Implant Date: 20190816
Lead Channel Impedance Value: 300 Ohm
Lead Channel Impedance Value: 530 Ohm
Lead Channel Impedance Value: 610 Ohm
Lead Channel Pacing Threshold Amplitude: 0.75 V
Lead Channel Pacing Threshold Amplitude: 1 V
Lead Channel Pacing Threshold Amplitude: 2.25 V
Lead Channel Pacing Threshold Pulse Width: 0.5 ms
Lead Channel Pacing Threshold Pulse Width: 0.5 ms
Lead Channel Pacing Threshold Pulse Width: 1 ms
Lead Channel Sensing Intrinsic Amplitude: 12 mV
Lead Channel Sensing Intrinsic Amplitude: 2.3 mV
Lead Channel Setting Pacing Amplitude: 2 V
Lead Channel Setting Pacing Amplitude: 2 V
Lead Channel Setting Pacing Amplitude: 3.75 V
Lead Channel Setting Pacing Pulse Width: 0.5 ms
Lead Channel Setting Pacing Pulse Width: 1 ms
Lead Channel Setting Sensing Sensitivity: 0.5 mV
Pulse Gen Serial Number: 9842973

## 2019-03-04 ENCOUNTER — Other Ambulatory Visit: Payer: Self-pay

## 2019-03-04 ENCOUNTER — Telehealth (INDEPENDENT_AMBULATORY_CARE_PROVIDER_SITE_OTHER): Payer: Medicare Other | Admitting: Cardiology

## 2019-03-04 ENCOUNTER — Telehealth: Payer: Self-pay

## 2019-03-04 VITALS — Ht 68.0 in | Wt 260.0 lb

## 2019-03-04 DIAGNOSIS — I428 Other cardiomyopathies: Secondary | ICD-10-CM

## 2019-03-04 DIAGNOSIS — Z95810 Presence of automatic (implantable) cardiac defibrillator: Secondary | ICD-10-CM

## 2019-03-04 DIAGNOSIS — I10 Essential (primary) hypertension: Secondary | ICD-10-CM

## 2019-03-04 DIAGNOSIS — T451X5A Adverse effect of antineoplastic and immunosuppressive drugs, initial encounter: Secondary | ICD-10-CM

## 2019-03-04 DIAGNOSIS — I5042 Chronic combined systolic (congestive) and diastolic (congestive) heart failure: Secondary | ICD-10-CM

## 2019-03-04 DIAGNOSIS — I427 Cardiomyopathy due to drug and external agent: Secondary | ICD-10-CM | POA: Diagnosis not present

## 2019-03-04 DIAGNOSIS — I480 Paroxysmal atrial fibrillation: Secondary | ICD-10-CM

## 2019-03-04 DIAGNOSIS — E1169 Type 2 diabetes mellitus with other specified complication: Secondary | ICD-10-CM

## 2019-03-04 DIAGNOSIS — E669 Obesity, unspecified: Secondary | ICD-10-CM

## 2019-03-04 DIAGNOSIS — E7800 Pure hypercholesterolemia, unspecified: Secondary | ICD-10-CM

## 2019-03-04 NOTE — Telephone Encounter (Signed)
Contacted patient to discuss AVS Instructions. Gave patient Luke's recommendations from today's virtual office visit. Informed patient that someone from the scheduling dept will be in contact with them to schedule their follow up appt. Patient voiced understanding and AVS mailed.

## 2019-03-04 NOTE — Telephone Encounter (Signed)
VERBAL CONSENT GIVEN TO JENNIFER ROTH ON 02/28/2019 AT 9:54AM.      Virtual Visit Pre-Appointment Phone Call  "[place], I am calling you today to discuss your upcoming appointment. We are currently trying to limit exposure to the virus that causes COVID-19 by seeing patients at home rather than in the office."  1. "What is the BEST phone number to call the day of the visit?" - include this in appointment notes  2. "Do you have or have access to (through a family member/friend) a smartphone with video capability that we can use for your visit?" a. If yes - list this number in appt notes as "cell" (if different from BEST phone #) and list the appointment type as a VIDEO visit in appointment notes b. If no - list the appointment type as a PHONE visit in appointment notes  3. Confirm consent - "In the setting of the current Covid19 crisis, you are scheduled for a (phone or video) visit with your provider on (date) at (time).  Just as we do with many in-office visits, in order for you to participate in this visit, we must obtain consent.  If you'd like, I can send this to your mychart (if signed up) or email for you to review.  Otherwise, I can obtain your verbal consent now.  All virtual visits are billed to your insurance company just like a normal visit would be.  By agreeing to a virtual visit, we'd like you to understand that the technology does not allow for your provider to perform an examination, and thus may limit your provider's ability to fully assess your condition. If your provider identifies any concerns that need to be evaluated in person, we will make arrangements to do so.  Finally, though the technology is pretty good, we cannot assure that it will always work on either your or our end, and in the setting of a video visit, we may have to convert it to a phone-only visit.  In either situation, we cannot ensure that we have a secure connection.  Are you willing to proceed?" STAFF:  Did the patient verbally acknowledge consent to telehealth visit? Document YES/NO here: YES  4. Advise patient to be prepared - "Two hours prior to your appointment, go ahead and check your blood pressure, pulse, oxygen saturation, and your weight (if you have the equipment to check those) and write them all down. When your visit starts, your provider will ask you for this information. If you have an Apple Watch or Kardia device, please plan to have heart rate information ready on the day of your appointment. Please have a pen and paper handy nearby the day of the visit as well."  5. Give patient instructions for MyChart download to smartphone OR Doximity/Doxy.me as below if video visit (depending on what platform provider is using)  6. Inform patient they will receive a phone call 15 minutes prior to their appointment time (may be from unknown caller ID) so they should be prepared to answer    TELEPHONE CALL NOTE  GIULIANNA ROCHA has been deemed a candidate for a follow-up tele-health visit to limit community exposure during the Covid-19 pandemic. I spoke with the patient via phone to ensure availability of phone/video source, confirm preferred email & phone number, and discuss instructions and expectations.  I reminded Sara Walker to be prepared with any vital sign and/or heart rhythm information that could potentially be obtained via home monitoring, at the time  of her visit. I reminded Sara Walker to expect a phone call prior to her visit.  Harold Hedge, CMA 03/04/2019 8:53 AM   INSTRUCTIONS FOR DOWNLOADING THE MYCHART APP TO SMARTPHONE  - The patient must first make sure to have activated MyChart and know their login information - If Apple, go to [employer] and type in MyChart in the search bar and download the app. If Android, ask patient to go to [employer] and type in Dora in the search bar and download the app. The app is free but as with any other app downloads, their  phone may require them to verify saved payment information or Apple/Android password.  - The patient will need to then log into the app with their MyChart username and password, and select [facility] as their healthcare provider to link the account. When it is time for your visit, go to the MyChart app, find appointments, and click Begin Video Visit. Be sure to Select Allow for your device to access the Microphone and Camera for your visit. You will then be connected, and your provider will be with you shortly.  **If they have any issues connecting, or need assistance please contact MyChart service desk [phone])**  **If using a computer, in order to ensure the best quality for their visit they will need to use either of the following Internet Browsers: [employer], or Google Chrome**  IF USING DOXIMITY or DOXY.ME - The patient will receive a link just prior to their visit by text.     FULL LENGTH CONSENT FOR TELE-HEALTH VISIT   I hereby voluntarily request, consent and authorize [facility] and its employed or contracted physicians, physician assistants, nurse practitioners or other licensed health care professionals (the Practitioner), to provide me with telemedicine health care services (the "Services") as deemed necessary by the treating Practitioner. I acknowledge and consent to receive the Services by the Practitioner via telemedicine. I understand that the telemedicine visit will involve communicating with the Practitioner through live audiovisual communication technology and the disclosure of certain medical information by electronic transmission. I acknowledge that I have been given the opportunity to request an in-person assessment or other available alternative prior to the telemedicine visit and am voluntarily participating in the telemedicine visit.  I understand that I have the right to withhold or withdraw my consent to the use of telemedicine in the course of  my care at any time, without affecting my right to future care or treatment, and that the Practitioner or I may terminate the telemedicine visit at any time. I understand that I have the right to inspect all information obtained and/or recorded in the course of the telemedicine visit and may receive copies of available information for a reasonable fee.  I understand that some of the potential risks of receiving the Services via telemedicine include:  [place] Kitchen Delay or interruption in medical evaluation due to technological equipment failure or disruption; . Information transmitted may not be sufficient (e.g. poor resolution of images) to allow for appropriate medical decision making by the Practitioner; and/or  . In rare instances, security protocols could fail, causing a breach of personal health information.  Furthermore, I acknowledge that it is my responsibility to provide information about my medical history, conditions and care that is complete and accurate to the best of my ability. I acknowledge that Practitioner's advice, recommendations, and/or decision may be based on factors not within their control, such as incomplete or inaccurate data provided  by me or distortions of diagnostic images or specimens that may result from electronic transmissions. I understand that the practice of medicine is not an exact science and that Practitioner makes no warranties or guarantees regarding treatment outcomes. I acknowledge that I will receive a copy of this consent concurrently upon execution via email to the email address I last provided but may also request a printed copy by calling the office of [facility].    I understand that my insurance will be billed for this visit.   I have read or had this consent read to me. . I understand the contents of this consent, which adequately explains the benefits and risks of the Services being provided via telemedicine.  . I have been provided ample opportunity to ask  questions regarding this consent and the Services and have had my questions answered to my satisfaction. . I give my informed consent for the services to be provided through the use of telemedicine in my medical care  By participating in this telemedicine visit I agree to the above.

## 2019-03-04 NOTE — Progress Notes (Signed)
Virtual Visit via Video Note   This visit type was conducted due to national recommendations for restrictions regarding the COVID-19 Pandemic (e.g. social distancing) in an effort to limit this patient's exposure and mitigate transmission in our community.  Due to her co-morbid illnesses, this patient is at least at moderate risk for complications without adequate follow up.  This format is felt to be most appropriate for this patient at this time.  All issues noted in this document were discussed and addressed.  A limited physical exam was performed with this format.  Please refer to the patient's chart for her consent to telehealth for [facility].   Date:  03/04/2019   ID:  Sara Walker, DOB 1951-04-18, MRN [id number]  Patient Location: Home Provider Location: Home  PCP:  Deland Pretty, MD  Cardiologist:  Sanda Klein, MD  Electrophysiologist:  None   Evaluation Performed:  Follow-Up Visit  Chief Complaint:  Bleeding on Xarelto  History of Present Illness:    Sara Walker is a 68 y.o. female with a history of a nonischemic cardiomyopathy, status post CRT-D-D with clinical improvement but without significant improvement in her EF.  Other medical issues include PAF and a history of nonsustained V. tach, chronic renal insufficiency stage III, and dyslipidemia.  She has been intolerant to multiple medications in the past including ACE inhibitor, ARB, and Entresto.  She is also unable to take statins.  She has declined warfarin in the past.  She could not afford Eliquis.  Dr. Sallyanne Kuster saw her in June this year.  We were able to get her on the patient assistance program for Xarelto and she started this on July 21.  She says she took it for about 9 days before she started having painful vaginal bleeding.  She tells me that despite her age of 67 she still has "functioning ovaries".  The Xarelto was stopped and her symptoms improved within 24 to 48 hours.  She is being evaluated for this,  she did have a biopsy the results of this are pending.  From a cardiac standpoint she is stable, she does have some chronic dyspnea on exertion but nothing new.  The patient does not have symptoms concerning for COVID-19 infection (fever, chills, cough, or new shortness of breath).    Past Medical History:  Diagnosis Date  . Angina   . Automatic implantable cardioverter-defibrillator in situ   . Breast cancer ([place])   . Cardiomyopathy secondary to chemotherapy [facility])    a.  doxurubicin;  b. s/p SJM Quadra Assura CRT-D, model [id number] Q, Ser# [id number]  . CHF (congestive heart failure) ([place])   . Chronic systolic heart failure ([place])   . Coronary artery disease   . DM2 (diabetes mellitus, type 2) ([place])   . Goiter   . Gout   . Hyperlipidemia   . Hypertension   . Invasive ductal carcinoma of breast ([place])   . LBBB (left bundle branch block)    evidnece of RBBB fall 2012  . 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 Pacemaker   . PONV (postoperative nausea and vomiting)    nausea post ICD placement relieved with Zofran  . Shortness of breath   . Syncope    Past Surgical History:  Procedure Laterality Date  . BI-VENTRICULAR IMPLANTABLE CARDIOVERTER DEFIBRILLATOR N/A 12/12/2011   Procedure: BI-VENTRICULAR IMPLANTABLE CARDIOVERTER DEFIBRILLATOR  (CRT-D);  Surgeon: Deboraha Sprang, MD;  Location: [facility] CATH LAB;  Service: Cardiovascular;  Laterality: N/A;  . BIV  ICD GENERATOR CHANGEOUT N/A 02/23/2018   Procedure: BIV ICD GENERATOR CHANGEOUT;  Surgeon: Sanda Klein, MD;  Location: [facility] CV LAB;  Service: Cardiovascular;  Laterality: N/A;  . BREAST SURGERY     Left mastectomy  . CARDIAC DEFIBRILLATOR PLACEMENT     St.Jude Quadra  . [place]  . DILATION AND CURETTAGE OF UTERUS  1975  . FOOT SURGERY  1994  . MASTECTOMY     right  . NM MYOCAR PERF WALL MOTION  12/01/2011   no ischemia; EF 22%  . [place]  . TUNNELED VENOUS CATHETER  PLACEMENT     removed     Current Meds  Medication Sig  . allopurinol (ZYLOPRIM) 300 MG tablet Take 450 mg by mouth daily.   . carvedilol (COREG) 25 MG tablet Take 1 tablet (25 mg total) by mouth 2 (two) times daily.  . Cholecalciferol (VITAMIN D) 2000 units CAPS Take 4,000 Units by mouth daily.   . colchicine 0.6 MG tablet Take 0.6 mg by mouth daily as needed (for gout).   . Lutein 20 MG CAPS Take 20 mg by mouth daily.   . magnesium oxide (MAG-OX) 400 (241.3 Mg) MG tablet Take 1-2 tablets (400-800 mg total) by mouth See admin instructions. Take 800 mg by mouth on even days alternating with 400 mg by mouth on odd days  . metolazone (ZAROXOLYN) 5 MG tablet TAKE ONE TABLET BY MOUTH ONCE DAILY AS NEEDED (TAKE  IF  WEIGHT  IS  OVER  255  POUNDS) (Patient taking differently: Take 5 mg by mouth daily as needed (if weight > 255). )  . NOVOLIN R 100 UNIT/ML injection 200 Units See admin instructions. Max 200 units via insulin pump  . potassium chloride SA (K-DUR) 20 MEQ tablet Take 2 tablets (40 mEq total) by mouth daily.  Marland Kitchen spironolactone (ALDACTONE) 50 MG tablet Take 1 tablet by mouth once daily  . torsemide (DEMADEX) 100 MG tablet TAKE 1 TABLET BY MOUTH ONCE DAILY (Patient taking differently: Take 50 mg by mouth See admin instructions. Take 50 mg by mouth at 1200 and take 50 mg by mouth at 1400)     Allergies:   Lasix [furosemide], Ace inhibitors, Codeine, Digitalis, Levaquin [levofloxacin in d5w], Other, Statins, and Zyrtec [cetirizine]   Social History   Tobacco Use  . Smoking status: Former Research scientist (life sciences)  . Smokeless tobacco: Never Used  Substance Use Topics  . Alcohol use: No    Alcohol/week: 0.0 standard drinks  . Drug use: No     Family Hx: The patient's family history includes Diabetes in her brother; Heart attack in her father; Hypertension in her brother.  ROS:   Please see the history of present illness.    All other systems reviewed and are negative.   Prior CV studies:   The  following studies were reviewed today:   Labs/Other Tests and Data Reviewed:    EKG:  No ECG reviewed.  Recent Labs: No results found for requested labs within last 8760 hours.   Recent Lipid Panel No results found for: CHOL, TRIG, HDL, CHOLHDL, LDLCALC, LDLDIRECT  Wt Readings from Last 3 Encounters:  03/04/19 260 lb (117.9 kg)  01/07/19 252 lb 9.6 oz (114.6 kg)  06/20/18 250 lb 6.4 oz (113.6 kg)     Objective:    Vital Signs:  Ht 5\' 8"  (1.727 m)   Wt 260 lb (117.9 kg)   BMI 39.53 kg/m    VITAL  SIGNS:  reviewed  ASSESSMENT & PLAN:    Painful vaginal bleeding on Xarelto This has been discontinued with improvement in her symptoms, OB GYN work is underway.   PAF- CHADS VASC=4  NICM- Last EF was 20-25% by echo 2015  ICD- St Jude CR-t, gen change Aug 2018  Plan:  I told the patient at this time it appears she is intolerant to Xarelto secondary to bleeding and should remain off it.  We will await further OB GYN work up before recommending any further changes. If surgery is recommended she would be an acceptable risk from a cardiovascular standpoint. I will arrange for f/u with Dr Sallyanne Kuster in the next few months.    COVID-19 Education: The signs and symptoms of COVID-19 were discussed with the patient and how to seek care for testing (follow up with PCP or arrange E-visit).  The importance of social distancing was discussed today.  Time:   Today, I have spent 25 minutes with the patient with telehealth technology discussing the above problems.     Medication Adjustments/Labs and Tests Ordered: Current medicines are reviewed at length with the patient today.  Concerns regarding medicines are outlined above.   Tests Ordered: No orders of the defined types were placed in this encounter.   Medication Changes: No orders of the defined types were placed in this encounter.   Follow Up:  In Person Dr Sallyanne Kuster  Signed, Kerin Ransom, PA-C  03/04/2019 3:32 PM    [place]

## 2019-03-04 NOTE — Patient Instructions (Signed)
Medication Instructions:  Your physician recommends that you continue on your current medications as directed. Please refer to the Current Medication list given to you today. If you need a refill on your cardiac medications before your next appointment, please call your pharmacy.   Lab work: None  If you have labs (blood work) drawn today and your tests are completely normal, you will receive your results only by: Marland Kitchen MyChart Message (if you have MyChart) OR . A paper copy in the mail If you have any lab test that is abnormal or we need to change your treatment, we will call you to review the results.  Testing/Procedures: None   Follow-Up: At [facility], you and your health needs are our priority.  As part of our continuing mission to provide you with exceptional heart care, we have created designated Provider Care Teams.  These Care Teams include your primary Cardiologist (physician) and Advanced Practice Providers (APPs -  Physician Assistants and Nurse Practitioners) who all work together to provide you with the care you need, when you need it. You will need a follow up appointment in 3 months. Please call our office 2 months in advance to schedule this appointment.  You may see Sanda Klein, MD or one of the following Advanced Practice Providers on your designated Care Team: [place], [place] . Fabian Sharp, PA-C  Any Other Special Instructions Will Be Listed Below (If Applicable).

## 2019-03-05 ENCOUNTER — Telehealth: Payer: Self-pay

## 2019-03-05 ENCOUNTER — Encounter: Payer: Self-pay | Admitting: Cardiology

## 2019-03-05 NOTE — Telephone Encounter (Signed)
Patient voiced understanding.

## 2019-03-05 NOTE — Progress Notes (Signed)
Remote ICD transmission.   

## 2019-03-05 NOTE — Telephone Encounter (Signed)
-----   Message from Erlene Quan, [place] sent at 03/05/2019 10:08 AM EDT ----- Please let the patient know I reviewed her situation with Dr Sallyanne Kuster and he agrees its too dangerous for her to be on anticoagulation at this time... no further Xarelto, Eliquis, or Warfarin.   Kerin Ransom PA-C 03/05/2019 10:09 AM

## 2019-03-05 NOTE — Progress Notes (Signed)
Thanks. She believes that this is physiological menstrual period bleeding, but I read Ob?gyn note and he thinks this is pathological bleeding. Keep off anticoagulant.

## 2019-03-14 ENCOUNTER — Encounter: Payer: Self-pay | Admitting: Cardiovascular Disease

## 2019-03-14 DIAGNOSIS — E1165 Type 2 diabetes mellitus with hyperglycemia: Secondary | ICD-10-CM | POA: Diagnosis not present

## 2019-03-14 DIAGNOSIS — I10 Essential (primary) hypertension: Secondary | ICD-10-CM | POA: Diagnosis not present

## 2019-03-14 DIAGNOSIS — I5022 Chronic systolic (congestive) heart failure: Secondary | ICD-10-CM | POA: Diagnosis not present

## 2019-03-14 DIAGNOSIS — E785 Hyperlipidemia, unspecified: Secondary | ICD-10-CM | POA: Diagnosis not present

## 2019-03-21 DIAGNOSIS — I5022 Chronic systolic (congestive) heart failure: Secondary | ICD-10-CM | POA: Diagnosis not present

## 2019-03-21 DIAGNOSIS — I10 Essential (primary) hypertension: Secondary | ICD-10-CM | POA: Diagnosis not present

## 2019-03-21 DIAGNOSIS — E1169 Type 2 diabetes mellitus with other specified complication: Secondary | ICD-10-CM | POA: Diagnosis not present

## 2019-03-21 DIAGNOSIS — Z8679 Personal history of other diseases of the circulatory system: Secondary | ICD-10-CM | POA: Diagnosis not present

## 2019-03-21 DIAGNOSIS — E7800 Pure hypercholesterolemia, unspecified: Secondary | ICD-10-CM | POA: Diagnosis not present

## 2019-03-21 DIAGNOSIS — N950 Postmenopausal bleeding: Secondary | ICD-10-CM | POA: Diagnosis not present

## 2019-03-28 DIAGNOSIS — N950 Postmenopausal bleeding: Secondary | ICD-10-CM | POA: Diagnosis not present

## 2019-04-01 NOTE — Progress Notes (Signed)
[facility] [address], [place] [address] Phone: [phone] Fax: [phone]      Your procedure is scheduled on September 24  Report to [facility] Main Entrance "A" at 0530 A.M., and check in at the Admitting office.  Call this number if you have problems the morning of surgery:  [phone]  Call [phone] if you have any questions prior to your surgery date Monday-Friday 8am-4pm    Remember:  Do not eat or drink after midnight the night before your surgery    Take these medicines the morning of surgery with A SIP OF WATER  allopurinol (ZYLOPRIM) carvedilol (COREG) Colchicine   7 days prior to surgery STOP taking any Aspirin (unless otherwise instructed by your surgeon), Aleve, Naproxen, Ibuprofen, Motrin, Advil, Goody's, BC's, all herbal medications, fish oil, and all vitamins.   Patients with Insulin Pumps    . For patients with Insulin Pumps: o Contact your diabetes doctor for specific instructions before surgery. o Decrease basal insulin rates by 20% at midnight the night before surgery. o Note that if your surgery is planned to be longer than 2 hours, your insulin pump will be removed and intravenous (IV) insulin will be started and managed by the nurses and anesthesiologist. You will be able to restart your insulin pump once you are awake and able to manage it. o Make sure to bring insulin pump supplies to the hospital with you in case your site needs to be changed.    How to Manage Your Diabetes Before and After Surgery  Why is it important to control my blood sugar before and after surgery? . Improving blood sugar levels before and after surgery helps healing and can limit problems. . A way of improving blood sugar control is eating a healthy diet by: o  Eating less sugar and carbohydrates o  Increasing activity/exercise o  Talking with your doctor about reaching your blood sugar  goals . High blood sugars (greater than 180 mg/dL) can raise your risk of infections and slow your recovery, so you will need to focus on controlling your diabetes during the weeks before surgery. . Make sure that the doctor who takes care of your diabetes knows about your planned surgery including the date and location.  How do I manage my blood sugar before surgery? . Check your blood sugar at least 4 times a day, starting 2 days before surgery, to make sure that the level is not too high or low. o Check your blood sugar the morning of your surgery when you wake up and every 2 hours until you get to the Short Stay unit. . If your blood sugar is less than 70 mg/dL, you will need to treat for low blood sugar: o Do not take insulin. o Treat a low blood sugar (less than 70 mg/dL) with  cup of clear juice (cranberry or apple), 4 glucose tablets, OR glucose gel. o Recheck blood sugar in 15 minutes after treatment (to make sure it is greater than 70 mg/dL). If your blood sugar is not greater than 70 mg/dL on recheck, call [phone] for further instructions. . Report your blood sugar to the short stay nurse when you get to Short Stay.  . If you are admitted to the hospital after surgery: o Your blood sugar will be checked by the staff and you will probably be given insulin after surgery (instead of oral diabetes medicines) to make sure you have good  blood sugar levels. o The goal for blood sugar control after surgery is 80-180 mg/dL.    The Morning of Surgery  Do not wear jewelry, make-up or nail polish.  Do not wear lotions, powders, or perfumes/colognes, or deodorant  Do not shave 48 hours prior to surgery.  Men may shave face and neck.  Do not bring valuables to the hospital.  [facility] is not responsible for any belongings or valuables.  If you are a smoker, DO NOT Smoke 24 hours prior to surgery IF you wear a CPAP at night please bring your mask, tubing, and machine the morning of  surgery   Remember that you must have someone to transport you home after your surgery, and remain with you for 24 hours if you are discharged the same day.   Contacts, glasses, hearing aids, dentures or bridgework may not be worn into surgery.    Leave your suitcase in the car.  After surgery it may be brought to your room.  For patients admitted to the hospital, discharge time will be determined by your treatment team.  Patients discharged the day of surgery will not be allowed to drive home.    Special instructions:   [facility]- Preparing For Surgery  Before surgery, you can play an important role. Because skin is not sterile, your skin needs to be as free of germs as possible. You can reduce the number of germs on your skin by washing with CHG (chlorahexidine gluconate) Soap before surgery.  CHG is an antiseptic cleaner which kills germs and bonds with the skin to continue killing germs even after washing.    Oral Hygiene is also important to reduce your risk of infection.  Remember - BRUSH YOUR TEETH THE MORNING OF SURGERY WITH YOUR REGULAR TOOTHPASTE  Please do not use if you have an allergy to CHG or antibacterial soaps. If your skin becomes reddened/irritated stop using the CHG.  Do not shave (including legs and underarms) for at least 48 hours prior to first CHG shower. It is OK to shave your face.  Please follow these instructions carefully.   1. Shower the NIGHT BEFORE SURGERY and the MORNING OF SURGERY with CHG Soap.   2. If you chose to wash your hair, wash your hair first as usual with your normal shampoo.  3. After you shampoo, rinse your hair and body thoroughly to remove the shampoo.  4. Use CHG as you would any other liquid soap. You can apply CHG directly to the skin and wash gently with a scrungie or a clean washcloth.   5. Apply the CHG Soap to your body ONLY FROM THE NECK DOWN.  Do not use on open wounds or open sores. Avoid contact with your eyes, ears,  mouth and genitals (private parts). Wash Face and genitals (private parts)  with your normal soap.   6. Wash thoroughly, paying special attention to the area where your surgery will be performed.  7. Thoroughly rinse your body with warm water from the neck down.  8. DO NOT shower/wash with your normal soap after using and rinsing off the CHG Soap.  9. Pat yourself dry with a CLEAN TOWEL.  10. Wear CLEAN PAJAMAS to bed the night before surgery, wear comfortable clothes the morning of surgery  11. Place CLEAN SHEETS on your bed the night of your first shower and DO NOT SLEEP WITH PETS.    Day of Surgery:  Do not apply any deodorants/lotions. Please shower the morning  of surgery with the CHG soap  Please wear clean clothes to the hospital/surgery center.   Remember to brush your teeth WITH YOUR REGULAR TOOTHPASTE.   Please read over the following fact sheets that you were given.

## 2019-04-02 ENCOUNTER — Encounter (HOSPITAL_COMMUNITY)
Admission: RE | Admit: 2019-04-02 | Discharge: 2019-04-02 | Disposition: A | Discharge status: Home / Self Care | Payer: Medicare Other | Source: Ambulatory Visit | Attending: Obstetrics and Gynecology | Admitting: Obstetrics and Gynecology

## 2019-04-02 ENCOUNTER — Other Ambulatory Visit (HOSPITAL_COMMUNITY)
Admission: RE | Admit: 2019-04-02 | Discharge: 2019-04-02 | Disposition: A | Discharge status: Home / Self Care | Payer: Medicare Other | Source: Ambulatory Visit | Attending: Obstetrics and Gynecology | Admitting: Obstetrics and Gynecology

## 2019-04-02 ENCOUNTER — Other Ambulatory Visit: Payer: Self-pay

## 2019-04-02 ENCOUNTER — Encounter (HOSPITAL_COMMUNITY): Payer: Self-pay

## 2019-04-02 DIAGNOSIS — Z9641 Presence of insulin pump (external) (internal): Secondary | ICD-10-CM | POA: Insufficient documentation

## 2019-04-02 DIAGNOSIS — Z95810 Presence of automatic (implantable) cardiac defibrillator: Secondary | ICD-10-CM | POA: Insufficient documentation

## 2019-04-02 DIAGNOSIS — Z794 Long term (current) use of insulin: Secondary | ICD-10-CM | POA: Diagnosis not present

## 2019-04-02 DIAGNOSIS — Z01810 Encounter for preprocedural cardiovascular examination: Secondary | ICD-10-CM | POA: Insufficient documentation

## 2019-04-02 DIAGNOSIS — E119 Type 2 diabetes mellitus without complications: Secondary | ICD-10-CM | POA: Diagnosis not present

## 2019-04-02 DIAGNOSIS — Z01812 Encounter for preprocedural laboratory examination: Secondary | ICD-10-CM | POA: Diagnosis not present

## 2019-04-02 HISTORY — DX: Chronic kidney disease, unspecified: N18.9

## 2019-04-02 HISTORY — DX: Depression, unspecified: F32.A

## 2019-04-02 HISTORY — DX: Unspecified osteoarthritis, unspecified site: M19.90

## 2019-04-02 HISTORY — DX: Anxiety disorder, unspecified: F41.9

## 2019-04-02 HISTORY — DX: Paroxysmal atrial fibrillation: I48.0

## 2019-04-02 LAB — BASIC METABOLIC PANEL
Anion gap: 12 (ref 5–15)
BUN: 11 mg/dL (ref 8–23)
CO2: 28 mmol/L (ref 22–32)
Calcium: 9.5 mg/dL (ref 8.9–10.3)
Chloride: 95 mmol/L — ABNORMAL LOW (ref 98–111)
Creatinine, Ser: 0.92 mg/dL (ref 0.44–1.00)
GFR calc Af Amer: >60 mL/min (ref >60)
GFR calc non Af Amer: >60 mL/min (ref >60)
Glucose, Bld: 95 mg/dL (ref 70–99)
Potassium: 3.9 mmol/L (ref 3.5–5.1)
Sodium: 135 mmol/L (ref 135–145)

## 2019-04-02 LAB — CBC
HCT: 41.2 % (ref 36.0–46.0)
Hemoglobin: 12.9 g/dL (ref 12.0–15.0)
MCH: 30.4 pg (ref 26.0–34.0)
MCHC: 31.3 g/dL (ref 30.0–36.0)
MCV: 96.9 fL (ref 80.0–100.0)
Platelets: 235 10*3/uL (ref 150–400)
RBC: 4.25 MIL/uL (ref 3.87–5.11)
RDW: 14.2 % (ref 11.5–15.5)
WBC: 10.3 10*3/uL (ref 4.0–10.5)
nRBC: 0 % (ref 0.0–0.2)

## 2019-04-02 LAB — GLUCOSE, CAPILLARY: Glucose-Capillary: 100 mg/dL — ABNORMAL HIGH (ref 70–99)

## 2019-04-02 LAB — SARS CORONAVIRUS 2 (TAT 6-24 HRS): SARS Coronavirus 2: NEGATIVE

## 2019-04-02 NOTE — Progress Notes (Addendum)
PCP - Deland Pretty Cardiologist - [place]  PPM/ICD - St. Jude - defibrillator Device Orders - faxed for further instructions Rep Notified - yes Windle Guard with [facility] notified.  Called and spoke with Aaron Edelman (04-02-19 @ [phone]).  Notified of pt's surgery date and time.  Chest x-ray - n/a EKG - 01-07-19  SA - yes, does not wear CPAP  DM - yes, Type 2 Pt has insulin pump Fasting Blood Sugar - 90-150 Checks Blood Sugar 8 times a day   COVID TEST- 04-02-19    Anesthesia review: yes, ICD, heart history  Patient denies shortness of breath, fever, cough and chest pain at PAT appointment   Patient verbalized understanding of instructions that were given to them at the PAT appointment. Patient was also instructed that they will need to review over the PAT instructions again at home before surgery.   No orders at PAT appointment, Dr. Gaetano Net notified.  Pt states she is still having periods, POCT urine preg test DOS

## 2019-04-03 ENCOUNTER — Encounter (HOSPITAL_COMMUNITY): Payer: Self-pay

## 2019-04-03 LAB — HEMOGLOBIN A1C
Hgb A1c MFr Bld: 6.8 % — ABNORMAL HIGH (ref 4.8–5.6)
Mean Plasma Glucose: 148 mg/dL

## 2019-04-03 NOTE — H&P (Signed)
Sara Walker is an 68 y.o. female with post menopausal bleeding. I last saw her 10 years ago when I recommended hysteroscopy and D&C. She declined at that time and was lost to follow up until 02/14/19 when she presented with a history of menses every month. She was placed on Xarelto and cycles became very heavy and painful. The Xarelto was held. U/S in my office noted multiple fibroids 1.7-3.4 cm and probable right hydrosalpinx.  A sonohysterogram noted 1.1 to 1.8 cm masses C/W polyps. EMBx - atrophic endometrium with polypoid cystic atrophic and breakdown. FSH 6.4  Pertinent Gynecological History: Menses: post-menopausal Bleeding: post menopausal bleeding Contraception: none DES exposure: denies Blood transfusions: unknown Sexually transmitted diseases: no past history Previous GYN Procedures: BTL  Last mammogram: unknown Date: N/A Last pap: unknown Date:  OB History: G2, P2   Menstrual History: Menarche age: unknown No LMP recorded.    Past Medical History:  Diagnosis Date  . Angina   . Anxiety   . Arthritis   . Automatic implantable cardioverter-defibrillator in situ   . Breast cancer ([place])   . Cardiomyopathy secondary to chemotherapy [facility])    a.  doxurubicin;  b. s/p SJM Quadra Assura CRT-D, model [id number] Q, Ser# [id number]  . CHF (congestive heart failure) ([place])   . Chronic kidney disease    Stage 3  . Chronic systolic heart failure ([place])   . Coronary artery disease   . Depression   . DM2 (diabetes mellitus, type 2) ([place])   . Goiter   . Gout   . Hyperlipidemia   . Hypertension   . Invasive ductal carcinoma of breast ([place])   . LBBB (left bundle branch block)    evidnece of RBBB fall 2012  . 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 PAF (paroxysmal atrial fibrillation) ([place])   . PONV (postoperative nausea and vomiting)    nausea post ICD placement relieved with Zofran  . Shortness of breath   . Syncope     Past Surgical History:  Procedure  Laterality Date  . BI-VENTRICULAR IMPLANTABLE CARDIOVERTER DEFIBRILLATOR N/A 12/12/2011   Procedure: BI-VENTRICULAR IMPLANTABLE CARDIOVERTER DEFIBRILLATOR  (CRT-D);  Surgeon: Deboraha Sprang, MD;  Location: [facility] CATH LAB;  Service: Cardiovascular;  Laterality: N/A;  . BIV ICD GENERATOR CHANGEOUT N/A 02/23/2018   Procedure: BIV ICD GENERATOR CHANGEOUT;  Surgeon: Sanda Klein, MD;  Location: [place] CV LAB;  Service: Cardiovascular;  Laterality: N/A;  . BREAST SURGERY     Left mastectomy  . CARDIAC DEFIBRILLATOR PLACEMENT     St.Jude Quadra  . [place]  . DILATION AND CURETTAGE OF UTERUS  1975  . FOOT SURGERY  1994  . MASTECTOMY     right  . NM MYOCAR PERF WALL MOTION  12/01/2011   no ischemia; EF 22%  . [place]  . TUNNELED VENOUS CATHETER PLACEMENT     removed    Family History  Problem Relation Age of Onset  . Heart attack Father   . Hypertension Brother   . Diabetes Brother     Social History:  reports that she has quit smoking. She has never used smokeless tobacco. She reports that she does not drink alcohol or use drugs.  Allergies:  Allergies  Allergen Reactions  . Lasix [Furosemide] Other (See Comments)    Causes gout  . Ace Inhibitors Cough  . Codeine Other (See Comments)    Crazy  . Digitalis Other (See Comments)    "  saw yellow circles"  . Levaquin [Levofloxacin In D5w] Other (See Comments)    Gout   . Other Other (See Comments)    Nitro spray: reaction unknown  . Statins Other (See Comments)    Reaction unknown  . Xarelto [Rivaroxaban]   . Zyrtec [Cetirizine] Other (See Comments)    Slept for a day then was up for 4 days afterwards    No medications prior to admission.    Review of Systems  Constitutional: Negative for fever.    There were no vitals taken for this visit. Physical Exam  Cardiovascular: Normal rate.  Respiratory: Effort normal.  GI: Soft.    No results found for this or any previous visit  (from the past 24 hour(s)).  No results found.  Assessment/Plan: 68 yo with PMB and endometrial masses on U/S suspicious for polyps Telephone consultation with Gyn Onc recommends H/S, D&C and placement of Mirena IUD. I D/W patient H/S, D&C, possible Myosure and risks including infection, uterine perforation and organ damage, bleeding/transfusion, DVT/PE, pneumonia, return to OR. Patient states she understands and agrees.  Shon Millet II 04/03/2019, 6:05 PM

## 2019-04-03 NOTE — Anesthesia Preprocedure Evaluation (Addendum)
Anesthesia Evaluation  Patient identified by MRN, date of birth, ID band Patient awake    Reviewed: Allergy & Precautions, NPO status , Patient's Chart, lab work & pertinent test results  History of Anesthesia Complications (+) PONV  Airway Mallampati: II  TM Distance: >3 FB Neck ROM: Full    Dental no notable dental hx.    Pulmonary shortness of breath, former smoker,    Pulmonary exam normal breath sounds clear to auscultation       Cardiovascular hypertension, Pt. on medications + angina + CAD and +CHF  Normal cardiovascular exam+ Cardiac Defibrillator  Rhythm:Regular Rate:Normal     Neuro/Psych Anxiety Depression negative neurological ROS  negative psych ROS   GI/Hepatic negative GI ROS, Neg liver ROS,   Endo/Other  negative endocrine ROSdiabetes  Renal/GU negative Renal ROS  negative genitourinary   Musculoskeletal  (+) Arthritis ,   Abdominal (+) + obese,   Peds negative pediatric ROS (+)  Hematology negative hematology ROS (+)   Anesthesia Other Findings   Reproductive/Obstetrics negative OB ROS                            Anesthesia Physical Anesthesia Plan  ASA: IV  Anesthesia Plan: MAC   Post-op Pain Management:    Induction: Intravenous  PONV Risk Score and Plan: 3 and Ondansetron, Dexamethasone, Midazolam and Treatment may vary due to age or medical condition  Airway Management Planned: Simple Face Mask  Additional Equipment:   Intra-op Plan:   Post-operative Plan:   Informed Consent: I have reviewed the patients History and Physical, chart, labs and discussed the procedure including the risks, benefits and alternatives for the proposed anesthesia with the patient or authorized representative who has indicated his/her understanding and acceptance.     Dental advisory given  Plan Discussed with: CRNA  Anesthesia Plan Comments: (PAT note written 04/03/2019  by Myra Gianotti, PA-C. )       Anesthesia Quick Evaluation

## 2019-04-03 NOTE — Progress Notes (Signed)
Anesthesia Chart Review:  Case: [id number] Date/Time: 04/04/19 0700   Procedures:      DILATATION AND CURETTAGE /HYSTEROSCOPY (N/A )     INTRAUTERINE DEVICE (IUD) INSERTION (N/A ) - Dr. Gaetano Net to bring IUD from office   Anesthesia type: Choice   Pre-op diagnosis: menorrhagia   Location: [place] [facility] [place] OR   Surgeon: Everlene Farrier, MD      DISCUSSION: Patient is a 68 year old female scheduled for the above procedure.  History includes former smoker, post-operative N/V, breast cancer (s/p right mastectomy), non-ischemic cardiomyopathy (diagnosed ~ 1990's, chemotherapy/anthracycline-related; intolerant to ACE-I and ARB due to cough), chronic combined systolic and diastolic CHF, BiV ICD (placed 2013; last CRT-D generator change out 02/23/18, St. Jude Quadra Assura), PAF, Torsade de pointes/Polymorphic VT (requiring ICD shock in setting hypokalemia, 08/2014), CAD (not well documented in Epic; no cath report seen), chest pain (2013), dyspnea, syncope (`2007), CKD stage III, HTN, HLD (intolerant to statins), DM2 (insulin pump), goiter, anxiety (including with lab draws, related to history of difficult venous access since chemotherapy). BMI is consistent with obesity.   As of 03/05/19, decision made by Dr. Sallyanne Kuster to discontinue anticoagulation (at least until GYN evaluation completed) due to painful vaginal bleeding shortly after starting Xarelto. During 03/04/19 office visit with Kerin Ransom, PA-C, he wrote, "If surgery is recommended she would be an acceptable risk from a cardiovascular standpoint."   04/02/19 COVID-19 test negative.  If no acute changes and I would anticipate that she could proceed as planned.  For perioperative ICD prescription form, procedure should not interfere with device function, no device reprogramming or magnet placement needed. She has an insulin pump, but case is posted for < 2 hours.   VS: BP 125/71   Pulse 69   Temp 36.4 C   Resp 20   Ht 5\' 8"  (1.727 m)   Wt 112 kg    SpO2 97%   BMI 37.56 kg/m   PROVIDERS: Deland Pretty, MD is PCP Sanda Klein, MD is cardiologist   LABS: Labs reviewed: Acceptable for surgery. (all labs ordered are listed, but only abnormal results are displayed)  Labs Reviewed  GLUCOSE, CAPILLARY - Abnormal; Notable for the following components:      Result Value   Glucose-Capillary 100 (*)    All other components within normal limits  HEMOGLOBIN A1C - Abnormal; Notable for the following components:   Hgb A1c MFr Bld 6.8 (*)    All other components within normal limits  BASIC METABOLIC PANEL - Abnormal; Notable for the following components:   Chloride 95 (*)    All other components within normal limits  CBC      EKG: 01/07/19: BiV paced rhythm   CV: CPX 10/24/14: Conclusion: Interpretation of this test is limited due to the  submaximal effort. Based on available data, exercise testing with  gas exchange demonstrates a mildly reduced peak VO2 when compared  to matched sedentary norms. The major limitation appears to be  due to the patient's obesity and related restrictive lung  physiology. However, there is also evidence of severe  chronotropic incompetence and a blunted BP response to exercise.  Would consider a trial of reducing patient's b-blocker as well as  recommendations for weight loss.    Echo 06/10/14: Study Conclusions  - Left ventricle: The cavity size was severely dilated. Wall  thickness was normal. Systolic function was severely reduced. The  estimated ejection fraction was in the range of 20% to 25%.  Diffuse hypokinesis.  - Mitral valve: Calcified annulus. Mildly thickened leaflets .  There was moderate regurgitation.  - Left atrium: The atrium was moderately to severely dilated.  - Atrial septum: No defect or patent foramen ovale was identified.    Nuclear stress test 12/01/11: Impression: Normal normal myocardial perfusion study.  EF 22%.  Global left ventricular systolic  function is severely reduced.  No significant wall abnormalities noted.  Findings are consistent with severe nonischemic cardiomyopathy.   Past Medical History:  Diagnosis Date  . Angina   . Anxiety   . Arthritis   . Automatic implantable cardioverter-defibrillator in situ   . Breast cancer ([place])   . Cardiomyopathy secondary to chemotherapy [facility])    a.  doxurubicin;  b. s/p SJM Quadra Assura CRT-D, model [id number] Q, Ser# [id number]  . CHF (congestive heart failure) ([place])   . Chronic kidney disease    Stage 3  . Chronic systolic heart failure ([place])   . Coronary artery disease   . Depression   . DM2 (diabetes mellitus, type 2) ([place])   . Goiter   . Gout   . Hyperlipidemia   . Hypertension   . Invasive ductal carcinoma of breast ([place])   . LBBB (left bundle branch block)    evidnece of RBBB fall 2012  . 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 PAF (paroxysmal atrial fibrillation) ([place])   . PONV (postoperative nausea and vomiting)    nausea post ICD placement relieved with Zofran  . Shortness of breath   . Syncope     Past Surgical History:  Procedure Laterality Date  . BI-VENTRICULAR IMPLANTABLE CARDIOVERTER DEFIBRILLATOR N/A 12/12/2011   Procedure: BI-VENTRICULAR IMPLANTABLE CARDIOVERTER DEFIBRILLATOR  (CRT-D);  Surgeon: Deboraha Sprang, MD;  Location: [facility] LAB;  Service: Cardiovascular;  Laterality: N/A;  . BIV ICD GENERATOR CHANGEOUT N/A 02/23/2018   Procedure: BIV ICD GENERATOR CHANGEOUT;  Surgeon: Sanda Klein, MD;  Location: [place] CV LAB;  Service: Cardiovascular;  Laterality: N/A;  . BREAST SURGERY     Left mastectomy  . CARDIAC DEFIBRILLATOR PLACEMENT     St.Jude Quadra  . [place]  . DILATION AND CURETTAGE OF UTERUS  1975  . FOOT SURGERY  1994  . MASTECTOMY     right  . NM MYOCAR PERF WALL MOTION  12/01/2011   no ischemia; EF 22%  . [place]  . TUNNELED VENOUS CATHETER PLACEMENT     removed     MEDICATIONS: . allopurinol (ZYLOPRIM) 300 MG tablet  . carvedilol (COREG) 25 MG tablet  . Cholecalciferol (VITAMIN D) 2000 units CAPS  . colchicine 0.6 MG tablet  . diphenhydrAMINE (BENADRYL) 25 MG tablet  . Lutein 20 MG CAPS  . magnesium oxide (MAG-OX) 400 (241.3 Mg) MG tablet  . metolazone (ZAROXOLYN) 5 MG tablet  . NOVOLIN R 100 UNIT/ML injection  . potassium chloride SA (K-DUR) 20 MEQ tablet  . spironolactone (ALDACTONE) 50 MG tablet  . torsemide (DEMADEX) 100 MG tablet   No current facility-administered medications for this encounter.      Myra Gianotti, PA-C Surgical Short Stay/Anesthesiology [facility] Phone [phone] [facility] Phone [phone] 04/03/2019 9:52 AM

## 2019-04-04 ENCOUNTER — Ambulatory Visit (HOSPITAL_COMMUNITY)
Admission: RE | Admit: 2019-04-04 | Discharge: 2019-04-04 | Disposition: A | Discharge status: Home / Self Care | Payer: Medicare Other | Attending: Obstetrics and Gynecology | Admitting: Obstetrics and Gynecology

## 2019-04-04 ENCOUNTER — Ambulatory Visit (HOSPITAL_COMMUNITY): Payer: Medicare Other | Admitting: Certified Registered"

## 2019-04-04 ENCOUNTER — Encounter (HOSPITAL_COMMUNITY)
Admission: RE | Disposition: A | Discharge status: Home / Self Care | Payer: Self-pay | Source: Home / Self Care | Attending: Obstetrics and Gynecology

## 2019-04-04 ENCOUNTER — Ambulatory Visit (HOSPITAL_COMMUNITY): Payer: Medicare Other | Admitting: Vascular Surgery

## 2019-04-04 ENCOUNTER — Encounter (HOSPITAL_COMMUNITY): Payer: Self-pay | Admitting: Certified Registered"

## 2019-04-04 ENCOUNTER — Other Ambulatory Visit: Payer: Self-pay

## 2019-04-04 DIAGNOSIS — Z95810 Presence of automatic (implantable) cardiac defibrillator: Secondary | ICD-10-CM | POA: Diagnosis not present

## 2019-04-04 DIAGNOSIS — I110 Hypertensive heart disease with heart failure: Secondary | ICD-10-CM | POA: Diagnosis not present

## 2019-04-04 DIAGNOSIS — F419 Anxiety disorder, unspecified: Secondary | ICD-10-CM | POA: Insufficient documentation

## 2019-04-04 DIAGNOSIS — N8500 Endometrial hyperplasia, unspecified: Secondary | ICD-10-CM | POA: Diagnosis not present

## 2019-04-04 DIAGNOSIS — Z87891 Personal history of nicotine dependence: Secondary | ICD-10-CM | POA: Insufficient documentation

## 2019-04-04 DIAGNOSIS — E785 Hyperlipidemia, unspecified: Secondary | ICD-10-CM | POA: Insufficient documentation

## 2019-04-04 DIAGNOSIS — Z30430 Encounter for insertion of intrauterine contraceptive device: Secondary | ICD-10-CM | POA: Diagnosis not present

## 2019-04-04 DIAGNOSIS — I5022 Chronic systolic (congestive) heart failure: Secondary | ICD-10-CM | POA: Diagnosis not present

## 2019-04-04 DIAGNOSIS — N183 Chronic kidney disease, stage 3 (moderate): Secondary | ICD-10-CM | POA: Insufficient documentation

## 2019-04-04 DIAGNOSIS — Z794 Long term (current) use of insulin: Secondary | ICD-10-CM | POA: Diagnosis not present

## 2019-04-04 DIAGNOSIS — I5043 Acute on chronic combined systolic (congestive) and diastolic (congestive) heart failure: Secondary | ICD-10-CM | POA: Diagnosis not present

## 2019-04-04 DIAGNOSIS — M109 Gout, unspecified: Secondary | ICD-10-CM | POA: Insufficient documentation

## 2019-04-04 DIAGNOSIS — M1990 Unspecified osteoarthritis, unspecified site: Secondary | ICD-10-CM | POA: Diagnosis not present

## 2019-04-04 DIAGNOSIS — E1122 Type 2 diabetes mellitus with diabetic chronic kidney disease: Secondary | ICD-10-CM | POA: Diagnosis not present

## 2019-04-04 DIAGNOSIS — Z79899 Other long term (current) drug therapy: Secondary | ICD-10-CM | POA: Insufficient documentation

## 2019-04-04 DIAGNOSIS — I480 Paroxysmal atrial fibrillation: Secondary | ICD-10-CM | POA: Diagnosis not present

## 2019-04-04 DIAGNOSIS — N840 Polyp of corpus uteri: Secondary | ICD-10-CM | POA: Diagnosis not present

## 2019-04-04 DIAGNOSIS — N950 Postmenopausal bleeding: Secondary | ICD-10-CM | POA: Diagnosis not present

## 2019-04-04 DIAGNOSIS — I130 Hypertensive heart and chronic kidney disease with heart failure and stage 1 through stage 4 chronic kidney disease, or unspecified chronic kidney disease: Secondary | ICD-10-CM | POA: Insufficient documentation

## 2019-04-04 HISTORY — PX: HYSTEROSCOPY W/D&C: SHX1775

## 2019-04-04 HISTORY — PX: INTRAUTERINE DEVICE (IUD) INSERTION: SHX5877

## 2019-04-04 LAB — ABO/RH: ABO/RH(D): O POS

## 2019-04-04 LAB — TYPE AND SCREEN
ABO/RH(D): O POS
Antibody Screen: NEGATIVE

## 2019-04-04 LAB — GLUCOSE, CAPILLARY
Glucose-Capillary: 185 mg/dL — ABNORMAL HIGH (ref 70–99)
Glucose-Capillary: 201 mg/dL — ABNORMAL HIGH (ref 70–99)

## 2019-04-04 LAB — POCT PREGNANCY, URINE: Preg Test, Ur: NEGATIVE

## 2019-04-04 SURGERY — DILATATION AND CURETTAGE /HYSTEROSCOPY
Anesthesia: Monitor Anesthesia Care | Site: Vagina

## 2019-04-04 MED ORDER — ONDANSETRON HCL 4 MG/2ML IJ SOLN
INTRAMUSCULAR | Status: DC | PRN
  Administered 2019-04-04: 4 mg via INTRAVENOUS

## 2019-04-04 MED ORDER — LIDOCAINE 2% (20 MG/ML) 5 ML SYRINGE
INTRAMUSCULAR | Status: DC | PRN
  Administered 2019-04-04: 40 mg via INTRAVENOUS

## 2019-04-04 MED ORDER — LIDOCAINE HCL 1 % IJ SOLN
INTRAMUSCULAR | Status: DC | PRN
  Administered 2019-04-04: 20 mL

## 2019-04-04 MED ORDER — LACTATED RINGERS IV SOLN: INTRAVENOUS | Status: DC

## 2019-04-04 MED ORDER — LIDOCAINE HCL 1 % IJ SOLN
INTRAMUSCULAR | Status: AC
  Filled 2019-04-04: qty 20

## 2019-04-04 MED ORDER — PROPOFOL 10 MG/ML IV BOLUS
INTRAVENOUS | Status: AC
  Filled 2019-04-04: qty 20

## 2019-04-04 MED ORDER — PROPOFOL 10 MG/ML IV BOLUS
INTRAVENOUS | Status: DC | PRN
  Administered 2019-04-04: 20 mg via INTRAVENOUS
  Administered 2019-04-04: 30 mg via INTRAVENOUS

## 2019-04-04 MED ORDER — LACTATED RINGERS IV SOLN
INTRAVENOUS | Status: DC | PRN
  Administered 2019-04-04: 07:00:00 via INTRAVENOUS

## 2019-04-04 MED ORDER — DEXAMETHASONE SODIUM PHOSPHATE 10 MG/ML IJ SOLN
INTRAMUSCULAR | Status: AC
  Filled 2019-04-04: qty 1

## 2019-04-04 MED ORDER — FENTANYL CITRATE (PF) 250 MCG/5ML IJ SOLN
INTRAMUSCULAR | Status: AC
  Filled 2019-04-04: qty 5

## 2019-04-04 MED ORDER — MIDAZOLAM HCL 5 MG/5ML IJ SOLN
INTRAMUSCULAR | Status: DC | PRN
  Administered 2019-04-04: 2 mg via INTRAVENOUS

## 2019-04-04 MED ORDER — GLYCOPYRROLATE PF 0.2 MG/ML IJ SOSY
PREFILLED_SYRINGE | INTRAMUSCULAR | Status: DC | PRN
  Administered 2019-04-04: .1 mg via INTRAVENOUS

## 2019-04-04 MED ORDER — MIDAZOLAM HCL 2 MG/2ML IJ SOLN
INTRAMUSCULAR | Status: AC
  Filled 2019-04-04: qty 2

## 2019-04-04 MED ORDER — FENTANYL CITRATE (PF) 100 MCG/2ML IJ SOLN
INTRAMUSCULAR | Status: DC | PRN
  Administered 2019-04-04 (×4): 25 ug via INTRAVENOUS

## 2019-04-04 MED ORDER — PHENYLEPHRINE 40 MCG/ML (10ML) SYRINGE FOR IV PUSH (FOR BLOOD PRESSURE SUPPORT)
PREFILLED_SYRINGE | INTRAVENOUS | Status: DC | PRN
  Administered 2019-04-04: 80 ug via INTRAVENOUS

## 2019-04-04 MED ORDER — ONDANSETRON HCL 4 MG/2ML IJ SOLN
INTRAMUSCULAR | Status: AC
  Filled 2019-04-04: qty 2

## 2019-04-04 MED ORDER — SUCCINYLCHOLINE CHLORIDE 200 MG/10ML IV SOSY
PREFILLED_SYRINGE | INTRAVENOUS | Status: AC
  Filled 2019-04-04: qty 10

## 2019-04-04 MED ORDER — DEXAMETHASONE SODIUM PHOSPHATE 4 MG/ML IJ SOLN
INTRAMUSCULAR | Status: DC | PRN
  Administered 2019-04-04: 4 mg via INTRAVENOUS

## 2019-04-04 MED ORDER — LIDOCAINE 2% (20 MG/ML) 5 ML SYRINGE
INTRAMUSCULAR | Status: AC
  Filled 2019-04-04: qty 5

## 2019-04-04 MED ORDER — SODIUM CHLORIDE 0.9 % IR SOLN
Status: DC | PRN
  Administered 2019-04-04: 200 mL

## 2019-04-04 MED ORDER — CEFAZOLIN SODIUM-DEXTROSE 2-4 GM/100ML-% IV SOLN
2.0000 g | INTRAVENOUS | Status: AC
  Administered 2019-04-04: 07:00:00 2 g via INTRAVENOUS
  Filled 2019-04-04: qty 100

## 2019-04-04 MED ORDER — GLYCOPYRROLATE PF 0.2 MG/ML IJ SOSY
PREFILLED_SYRINGE | INTRAMUSCULAR | Status: AC
  Filled 2019-04-04: qty 1

## 2019-04-04 MED ORDER — PHENYLEPHRINE 40 MCG/ML (10ML) SYRINGE FOR IV PUSH (FOR BLOOD PRESSURE SUPPORT)
PREFILLED_SYRINGE | INTRAVENOUS | Status: AC
  Filled 2019-04-04: qty 10

## 2019-04-04 MED ORDER — SODIUM CHLORIDE 0.9 % IV SOLN: INTRAVENOUS | Status: DC

## 2019-04-04 MED ORDER — PROPOFOL 500 MG/50ML IV EMUL
INTRAVENOUS | Status: DC | PRN
  Administered 2019-04-04: 75 ug/kg/min via INTRAVENOUS

## 2019-04-04 SURGICAL SUPPLY — 17 items
BIPOLAR CUTTING LOOP 21FR (ELECTRODE)
CATH ROBINSON RED A/P 16FR (CATHETERS) ×4 IMPLANT
ELECT COAG BIPOL BALL 21FR (ELECTRODE) ×4 IMPLANT
ELECT REM PT RETURN 9FT ADLT (ELECTROSURGICAL)
ELECTRODE REM PT RTRN 9FT ADLT (ELECTROSURGICAL) IMPLANT
GLOVE BIO SURGEON STRL SZ7.5 (GLOVE) ×8 IMPLANT
GLOVE BIOGEL PI IND STRL 7.0 (GLOVE) ×2 IMPLANT
GLOVE BIOGEL PI IND STRL 8 (GLOVE) ×2 IMPLANT
GLOVE BIOGEL PI INDICATOR 7.0 (GLOVE) ×2
GLOVE BIOGEL PI INDICATOR 8 (GLOVE) ×2
GOWN STRL REUS W/ TWL LRG LVL3 (GOWN DISPOSABLE) ×4 IMPLANT
GOWN STRL REUS W/TWL LRG LVL3 (GOWN DISPOSABLE) ×8
KIT PROCEDURE FLUENT (KITS) ×4 IMPLANT
LOOP CUTTING BIPOLAR 21FR (ELECTRODE) IMPLANT
PACK VAGINAL MINOR WOMEN LF (CUSTOM PROCEDURE TRAY) ×4 IMPLANT
PAD OB MATERNITY 4.3X12.25 (PERSONAL CARE ITEMS) ×4 IMPLANT
TOWEL GREEN STERILE FF (TOWEL DISPOSABLE) ×8 IMPLANT

## 2019-04-04 NOTE — Anesthesia Procedure Notes (Signed)
Procedure Name: MAC Date/Time: 04/04/2019 7:35 AM Performed by: Orlie Dakin, CRNA Pre-anesthesia Checklist: Patient identified, Emergency Drugs available, Suction available and Patient being monitored Patient Re-evaluated:Patient Re-evaluated prior to induction Oxygen Delivery Method: Simple face mask Preoxygenation: Pre-oxygenation with 100% oxygen Induction Type: IV induction

## 2019-04-04 NOTE — OR Nursing (Signed)
Serial # for mirena [id number]

## 2019-04-04 NOTE — Op Note (Signed)
NAME: Sara, [name] MEDICAL RECORD [id number] ACCOUNT [id number] DATE OF BIRTH:1951/06/22 FACILITY: [facility] LOCATION: [facility] PHYSICIAN:Devery Odwyer E. Swayzie Choate II, MD  OPERATIVE REPORT  DATE OF PROCEDURE:  04/04/2019  PREOPERATIVE DIAGNOSIS:  Postmenopausal bleeding.  POSTOPERATIVE DIAGNOSIS:  Postmenopausal bleeding.  PROCEDURE:  Hysteroscopy, dilation and curettage, and insertion of Mirena IUD.  SURGEON:  Everlene Farrier II, MD  ANESTHESIA:  Per anesthesia note.  ESTIMATED BLOOD LOSS:  Drops.  SPECIMENS:  Endometrial curettings.  INDICATIONS AND CONSENT:  This patient is a 68 year old patient who has had almost monthly bleeding for the past 10 years.  Ultrasound in the office reveals probable endometrial polyps, and endometrial biopsy was benign.  Hysteroscopy, D and C, possible  MyoSure, and insertion of Mirena IUD have been discussed with the patient.  Consultation with GYN oncology was carried out prior to this, and they recommended a Mirena insertion.  Potential risks and complications were discussed preoperatively including  but not limited to infection, organ damage, bleeding requiring transfusion of blood products with HIV and hepatitis acquisition, DVT, PE, pneumonia, uterine perforation.  The patient states she understands and agrees, and consent was signed on the chart.  DESCRIPTION OF PROCEDURE:  The patient was taken to the operating room where she was identified, placed in the dorsal supine position, and given IV sedation.  She was then placed in the dorsal lithotomy position.  Time-out was undertaken.  She was  prepped vaginally with Betadine, bladder straight catheterized, and draped in sterile fashion.  Bivalve speculum was placed.  Anterior cervical lip was injected with 1% lidocaine and grasped with a single-tooth tenaculum.  Paracervical block was placed  at the 2, 4, 5, 7, 8, and 10 o'clock positions with approximately 20 mL of the same solution.  Cervix was gently  progressively dilated.  Hysteroscope was placed in the endocervical canal and advanced under direct visualization.  Multiple 1 to 1.5 cm  pedunculated polypoid-type masses are noted.  Hysteroscope was withdrawn and sharp curettage was carried out.  This was productive of tissue.  Reinspection with the hysteroscope revealed the cavity to be clean and the ostia were identified.  The  hysteroscope was then removed.  Good hemostasis was noted.  The Mirena IUD was placed without difficulty after sounding the uterus to 8 cm.  Strings were trimmed.  Instruments removed.  All counts were correct.  The patient was awakened and taken to  recovery room in stable condition.  LN/NUANCE  D:04/04/2019 T:04/04/2019 JOB:[id number]

## 2019-04-04 NOTE — Anesthesia Postprocedure Evaluation (Signed)
Anesthesia Post Note  Patient: Sara [name]  Procedure(s) Performed: DILATATION AND CURETTAGE /HYSTEROSCOPY (N/A Vagina ) INTRAUTERINE DEVICE (IUD) INSERTION (N/A )     Patient location during evaluation: PACU Anesthesia Type: MAC Level of consciousness: awake and alert Pain management: pain level controlled Vital Signs Assessment: post-procedure vital signs reviewed and stable Respiratory status: spontaneous breathing, nonlabored ventilation and respiratory function stable Cardiovascular status: stable and blood pressure returned to baseline Postop Assessment: no apparent nausea or vomiting Anesthetic complications: no    Last Vitals:  Vitals:   04/04/19 0814 04/04/19 0815  BP:    Pulse: 78   Resp: 13   Temp:  (!) 36.3 C  SpO2: 96%     Last Pain:  Vitals:   04/04/19 0815  TempSrc:   PainSc: 0-No pain                 Lynda Rainwater

## 2019-04-04 NOTE — Transfer of Care (Signed)
Immediate Anesthesia Transfer of Care Note  Patient: Sara [name]  Procedure(s) Performed: DILATATION AND CURETTAGE /HYSTEROSCOPY (N/A Vagina ) INTRAUTERINE DEVICE (IUD) INSERTION (N/A )  Patient Location: PACU  Anesthesia Type:MAC  Level of Consciousness: awake, alert , oriented and patient cooperative  Airway & Oxygen Therapy: Patient Spontanous Breathing and Patient connected to face mask oxygen  Post-op Assessment: Report given to RN and Post -op Vital signs reviewed and stable  Post vital signs: Reviewed and stable  Last Vitals:  Vitals Value Taken Time  BP 126/71 04/04/19 0754  Temp    Pulse 77 04/04/19 0755  Resp 20 04/04/19 0755  SpO2 100 % 04/04/19 0755  Vitals shown include unvalidated device data.  Last Pain:  Vitals:   04/04/19 0648  TempSrc:   PainSc: 0-No pain         Complications: No apparent anesthesia complications

## 2019-04-04 NOTE — OR Nursing (Signed)
Mirena inserted at 0740 by Dr. Everlene Farrier at [facility] main [place] room [place], [place], [place] CRNA Leretha Pol, RN Lot [phone] Exp Aug 2020

## 2019-04-04 NOTE — Progress Notes (Signed)
04/04/2019  7:54 AM  PATIENT:  Sara [name]  68 y.o. female  PRE-OPERATIVE DIAGNOSIS: post menopausal bleeding  POST-OPERATIVE DIAGNOSIS:  Post menopausal bleeding  PROCEDURE:  Procedure(s) with comments: DILATATION AND CURETTAGE /HYSTEROSCOPY (N/A) INTRAUTERINE DEVICE (IUD) INSERTION (N/A) - Dr. Gaetano Net to bring IUD from office  SURGEON:  Surgeon(s) and Role:    * Everlene Farrier, MD - Primary  PHYSICIAN ASSISTANT:   ASSISTANTS: none   ANESTHESIA:   Per anesthesia record  EBL:  Per anesthesia record   BLOOD ADMINISTERED:none  DRAINS: none   LOCAL MEDICATIONS USED:  LIDOCAINE  and Amount: 20 ml  SPECIMEN:  Source of Specimen:  endometrial currettings  DISPOSITION OF SPECIMEN:  PATHOLOGY  COUNTS:  YES  TOURNIQUET:  * No tourniquets in log *  DICTATION: .Other Dictation: Dictation Number [id number]  PLAN OF CARE: Discharge to home after PACU  PATIENT DISPOSITION:  PACU - hemodynamically stable.   Delay start of Pharmacological VTE agent (>24hrs) due to surgical blood loss or risk of bleeding: not applicable

## 2019-04-05 ENCOUNTER — Encounter (HOSPITAL_COMMUNITY): Payer: Self-pay | Admitting: Obstetrics and Gynecology

## 2019-04-05 LAB — SURGICAL PATHOLOGY

## 2019-04-16 DIAGNOSIS — M79675 Pain in left toe(s): Secondary | ICD-10-CM | POA: Diagnosis not present

## 2019-04-16 DIAGNOSIS — E1142 Type 2 diabetes mellitus with diabetic polyneuropathy: Secondary | ICD-10-CM | POA: Diagnosis not present

## 2019-04-16 DIAGNOSIS — B351 Tinea unguium: Secondary | ICD-10-CM | POA: Diagnosis not present

## 2019-04-18 ENCOUNTER — Other Ambulatory Visit: Payer: Self-pay | Admitting: Cardiovascular Disease

## 2019-05-13 ENCOUNTER — Other Ambulatory Visit: Payer: Self-pay | Admitting: Cardiovascular Disease

## 2019-05-16 ENCOUNTER — Other Ambulatory Visit: Payer: Self-pay | Admitting: Cardiovascular Disease

## 2019-05-27 ENCOUNTER — Ambulatory Visit (INDEPENDENT_AMBULATORY_CARE_PROVIDER_SITE_OTHER): Payer: Medicare Other | Admitting: *Deleted

## 2019-05-27 DIAGNOSIS — I472 Ventricular tachycardia, unspecified: Secondary | ICD-10-CM

## 2019-05-27 DIAGNOSIS — I480 Paroxysmal atrial fibrillation: Secondary | ICD-10-CM

## 2019-05-28 LAB — CUP PACEART REMOTE DEVICE CHECK
Battery Remaining Longevity: 46 mo
Battery Remaining Percentage: 77 %
Battery Voltage: 2.96 V
Brady Statistic AP VP Percent: 1.6 %
Brady Statistic AP VS Percent: 1 %
Brady Statistic AS VP Percent: 97 %
Brady Statistic AS VS Percent: 1 %
Brady Statistic RA Percent Paced: 1.4 %
Date Time Interrogation Session: 20201116090805
HighPow Impedance: 83 Ohm
HighPow Impedance: 83 Ohm
Implantable Lead Implant Date: 20130603
Implantable Lead Implant Date: 20130603
Implantable Lead Implant Date: 20130603
Implantable Lead Location: 753858
Implantable Lead Location: 753859
Implantable Lead Location: 753860
Implantable Pulse Generator Implant Date: 20190816
Lead Channel Impedance Value: 380 Ohm
Lead Channel Impedance Value: 580 Ohm
Lead Channel Impedance Value: 680 Ohm
Lead Channel Pacing Threshold Amplitude: 0.75 V
Lead Channel Pacing Threshold Amplitude: 1 V
Lead Channel Pacing Threshold Amplitude: 2.25 V
Lead Channel Pacing Threshold Pulse Width: 0.5 ms
Lead Channel Pacing Threshold Pulse Width: 0.5 ms
Lead Channel Pacing Threshold Pulse Width: 1 ms
Lead Channel Sensing Intrinsic Amplitude: 12 mV
Lead Channel Sensing Intrinsic Amplitude: 2.9 mV
Lead Channel Setting Pacing Amplitude: 2 V
Lead Channel Setting Pacing Amplitude: 2 V
Lead Channel Setting Pacing Amplitude: 3.75 V
Lead Channel Setting Pacing Pulse Width: 0.5 ms
Lead Channel Setting Pacing Pulse Width: 1 ms
Lead Channel Setting Sensing Sensitivity: 0.5 mV
Pulse Gen Serial Number: 9842973

## 2019-05-30 ENCOUNTER — Telehealth: Payer: Medicare Other | Admitting: Cardiovascular Disease

## 2019-06-13 ENCOUNTER — Encounter: Payer: Self-pay | Admitting: Cardiovascular Disease

## 2019-06-13 DIAGNOSIS — N950 Postmenopausal bleeding: Secondary | ICD-10-CM | POA: Diagnosis not present

## 2019-06-13 DIAGNOSIS — E1169 Type 2 diabetes mellitus with other specified complication: Secondary | ICD-10-CM | POA: Diagnosis not present

## 2019-06-13 DIAGNOSIS — I5022 Chronic systolic (congestive) heart failure: Secondary | ICD-10-CM | POA: Diagnosis not present

## 2019-06-13 DIAGNOSIS — E7800 Pure hypercholesterolemia, unspecified: Secondary | ICD-10-CM | POA: Diagnosis not present

## 2019-06-17 ENCOUNTER — Other Ambulatory Visit: Payer: Self-pay | Admitting: Cardiovascular Disease

## 2019-06-21 NOTE — Progress Notes (Signed)
Remote ICD transmission.   

## 2019-07-02 ENCOUNTER — Other Ambulatory Visit: Payer: Self-pay

## 2019-07-02 ENCOUNTER — Ambulatory Visit (INDEPENDENT_AMBULATORY_CARE_PROVIDER_SITE_OTHER): Payer: Medicare Other | Admitting: Cardiovascular Disease

## 2019-07-02 ENCOUNTER — Encounter: Payer: Self-pay | Admitting: Cardiovascular Disease

## 2019-07-02 VITALS — BP 116/72 | HR 79 | Ht 67.0 in | Wt 250.6 lb

## 2019-07-02 DIAGNOSIS — Z8679 Personal history of other diseases of the circulatory system: Secondary | ICD-10-CM

## 2019-07-02 DIAGNOSIS — E669 Obesity, unspecified: Secondary | ICD-10-CM

## 2019-07-02 DIAGNOSIS — E1169 Type 2 diabetes mellitus with other specified complication: Secondary | ICD-10-CM

## 2019-07-02 DIAGNOSIS — I480 Paroxysmal atrial fibrillation: Secondary | ICD-10-CM | POA: Diagnosis not present

## 2019-07-02 DIAGNOSIS — I5042 Chronic combined systolic (congestive) and diastolic (congestive) heart failure: Secondary | ICD-10-CM | POA: Diagnosis not present

## 2019-07-02 DIAGNOSIS — Z8739 Personal history of other diseases of the musculoskeletal system and connective tissue: Secondary | ICD-10-CM

## 2019-07-02 DIAGNOSIS — E7800 Pure hypercholesterolemia, unspecified: Secondary | ICD-10-CM

## 2019-07-02 DIAGNOSIS — I428 Other cardiomyopathies: Secondary | ICD-10-CM | POA: Diagnosis not present

## 2019-07-02 DIAGNOSIS — I10 Essential (primary) hypertension: Secondary | ICD-10-CM

## 2019-07-02 NOTE — Progress Notes (Signed)
Patient ID: Sara Walker, female   DOB: January 02, 1951, 68 y.o.   MRN: [id number]     Cardiology Office Note    Date:  07/02/2019   ID:  Sara Walker, DOB [birth year], MRN [id number]  PCP:  Deland Pretty, MD  Cardiologist:   Sanda Klein, MD   No chief complaint on file.   History of Present Illness:  Sara Walker is a 68 y.o. female who presents for severe nonischemic cardiomyopathy and advanced (stage D) combined systolic and diastolic heart failure, s/p CRT-D (generator change August 2019, [place]), history of paroxysmal atrial fibrillation and polymorphic VT requiring ICD shocks (in the setting of hypokalemia). She has a left bundle branch block and showed substantial improvement in clinical status after cardiac resynchronization therapy. However, LVEF remains 20-25 %. She has been intolerant of ACE inhibitors, ARB's and Entresto.  She continues to have exertional dyspnea NYHA functional class II.  Some days are worse than others and there is no clear association of her dyspnea with a change in diet or lower extremity edema.  She does not have orthopnea or PND.  Many times she will sleep soundly horizontally in bed for 4 hours after which she wakes up to use the bathroom and then will spend the rest of her night in a recliner.  She has "constant chest pain".  This has been a complaint for years.  It has not changed recently.  She denies syncope, dizziness, palpitations, defibrillator discharges, intermittent claudication, focal neurological complaints or overt bleeding problems.  September she underwent D&C and placement of an IUD due to the persistent uterine bleeding.  She has had at least one more episode of bleeding since then.  We have stopped her anticoagulant because of this.  Her weight is actually 7 pounds lower today than it was at the beginning of the month and comparable to her weight in September, before she had her D&C.  Not have leg edema.  Her labs from  September 22 showed a hemoglobin of 12.9 and a creatinine of 0.92, potassium 3.9.  Hemoglobin A1c 6.7%.  More recent labs from December 3 show hemoglobin 12.5, glucose 128, sodium 133, creatinine 1.2, BUN 33, normal liver function tests, slight worsening of her hemoglobin A1c to 7.6%.  She has an insulin pump and a glucose semipermanent monitor.  Comprehensive defibrillator testing including threshold testing is performed today.  Device function is normal and battery generator longevity is estimated at 3.7 years.  She has 99 % biventricular pacing and only 1.4 % atrial pacing.  She has not required VT/VF therapies since 2016 when they occurred in the setting of severe electrolyte abnormalities.    As always her coronary sinus lead has a relatively high pacing threshold 2.25 V at 1.0 ms.  This has been stable.  He has had 2 meaningful episodes of atrial fibrillation that occurred in September and on November 29 respectively.  Each episode lasted about 6 hours and there was good ventricular rate control.  Interspersed between these episodes she has very brief episodes of paroxysmal atrial tachycardia lasting for less than 10 seconds at a time.  Her thoracic impedance does not show any current signs of fluid overload, in fact her thoracic impedance trend is to wards "dry", which is supported by her most recent lab test as well.  She has always had a relatively high left ventricular lead pacing thresholds (today 2.25 V at 1.0 ms, M3-RV coil) all the other lead parameters are  excellent.    After years of chemotherapy she has very difficult venous access, to the point that she has some degree of PTSD.  She has often refused to have repeat labs drawn because of this.  She has not yet had an ionized calcium checked.  Past Medical History:  Diagnosis Date  . Angina   . Anxiety   . Arthritis   . Automatic implantable cardioverter-defibrillator in situ   . Breast cancer ([place])   . Cardiomyopathy  secondary to chemotherapy [facility])    a.  doxurubicin;  b. s/p SJM Quadra Assura CRT-D, model [id number], Ser# [id number]  . CHF (congestive heart failure) ([place])   . Chronic kidney disease    Stage 3  . Chronic systolic heart failure ([place])   . Coronary artery disease   . Depression   . DM2 (diabetes mellitus, type 2) ([place])   . Goiter   . Gout   . Hyperlipidemia   . Hypertension   . Invasive ductal carcinoma of breast ([place])   . LBBB (left bundle branch block)    evidnece of RBBB fall 2012  . 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 PAF (paroxysmal atrial fibrillation) ([place])   . PONV (postoperative nausea and vomiting)    nausea post ICD placement relieved with Zofran  . Shortness of breath   . Syncope     Past Surgical History:  Procedure Laterality Date  . BI-VENTRICULAR IMPLANTABLE CARDIOVERTER DEFIBRILLATOR N/A 12/12/2011   Procedure: BI-VENTRICULAR IMPLANTABLE CARDIOVERTER DEFIBRILLATOR  (CRT-D);  Surgeon: Deboraha Sprang, MD;  Location: [facility] CATH LAB;  Service: Cardiovascular;  Laterality: N/A;  . BIV ICD GENERATOR CHANGEOUT N/A 02/23/2018   Procedure: BIV ICD GENERATOR CHANGEOUT;  Surgeon: Sanda Klein, MD;  Location: [place] CV LAB;  Service: Cardiovascular;  Laterality: N/A;  . BREAST SURGERY     Left mastectomy  . CARDIAC DEFIBRILLATOR PLACEMENT     [employer]  . [place]  . DILATION AND CURETTAGE OF UTERUS  1975  . FOOT SURGERY  1994  . HYSTEROSCOPY WITH D & C N/A 04/04/2019   Procedure: DILATATION AND CURETTAGE /HYSTEROSCOPY;  Surgeon: Everlene Farrier, MD;  Location: [place];  Service: Gynecology;  Laterality: N/A;  . INTRAUTERINE DEVICE (IUD) INSERTION N/A 04/04/2019   Procedure: INTRAUTERINE DEVICE (IUD) INSERTION;  Surgeon: Everlene Farrier, MD;  Location: [place];  Service: Gynecology;  Laterality: N/A;  Dr. Gaetano Net to bring IUD from office  . MASTECTOMY     right  . NM MYOCAR PERF WALL MOTION  12/01/2011   no ischemia; EF 22%    . [place]  . TUNNELED VENOUS CATHETER PLACEMENT     removed    Outpatient Medications Prior to Visit  Medication Sig Dispense Refill  . allopurinol (ZYLOPRIM) 300 MG tablet Take 450 mg by mouth daily.     . carvedilol (COREG) 25 MG tablet Take 1 tablet (25 mg total) by mouth 2 (two) times daily. 180 tablet 3  . Cholecalciferol (VITAMIN D) 2000 units CAPS Take 4,000 Units by mouth daily.     . colchicine 0.6 MG tablet Take 0.6 mg by mouth daily as needed (for gout).     Marland Kitchen diphenhydrAMINE (BENADRYL) 25 MG tablet Take 25 mg by mouth every 8 (eight) hours as needed for allergies.    . Lutein 20 MG CAPS Take 20 mg by mouth daily.     . magnesium oxide (MAG-OX) 400 (241.3 Mg) MG tablet TAKE  2 TABLETS BY MOUTH ONCE DAILY ON EVEN DAYS AND TAKE 1 TABLET ONCE DAILY ON ODD DAYS. 135 tablet 2  . metolazone (ZAROXOLYN) 5 MG tablet TAKE 1 TABLET BY MOUTH ONCE DAILY AS NEEDED (TAKE IF WEIGHT IS OVER 255 POUNDS) 90 tablet 0  . NOVOLIN R 100 UNIT/ML injection Inject 200 Units into the skin See admin instructions. Max 200 units via insulin pump  6  . potassium chloride SA (K-DUR) 20 MEQ tablet Take 2 tablets (40 mEq total) by mouth daily. 180 tablet 3  . spironolactone (ALDACTONE) 50 MG tablet Take 1 tablet by mouth once daily 90 tablet 2  . torsemide (DEMADEX) 100 MG tablet Take 1 tablet by mouth once daily 90 tablet 0   No facility-administered medications prior to visit.     Allergies:   Lasix [furosemide], Ace inhibitors, Codeine, Digitalis, Levaquin [levofloxacin in d5w], Other, Statins, Xarelto [rivaroxaban], and Zyrtec [cetirizine]   Social History   Socioeconomic History  . Marital status: Divorced    Spouse name: Not on file  . Number of children: Not on file  . Years of education: Not on file  . Highest education level: Not on file  Occupational History  . Not on file  Tobacco Use  . Smoking status: Former Research scientist (life sciences)  . Smokeless tobacco: Never Used  Substance and Sexual  Activity  . Alcohol use: No    Alcohol/week: 0.0 standard drinks  . Drug use: No  . Sexual activity: Not Currently  Other Topics Concern  . Not on file  Social History Narrative  . Not on file   Social Determinants of Health   Financial Resource Strain:   . Difficulty of Paying Living Expenses: Not on file  Food Insecurity:   . Worried About Charity fundraiser in the Last Year: Not on file  . Ran Out of Food in the Last Year: Not on file  Transportation Needs:   . Lack of Transportation (Medical): Not on file  . Lack of Transportation (Non-Medical): Not on file  Physical Activity:   . Days of Exercise per Week: Not on file  . Minutes of Exercise per Session: Not on file  Stress:   . Feeling of Stress : Not on file  Social Connections:   . Frequency of Communication with Friends and Family: Not on file  . Frequency of Social Gatherings with Friends and Family: Not on file  . Attends Religious Services: Not on file  . Active Member of Clubs or Organizations: Not on file  . Attends Archivist Meetings: Not on file  . Marital Status: Not on file     Family History:  The patient's family history includes Diabetes in her brother; Heart attack in her father; Hypertension in her brother.   ROS:   Please see the history of present illness.    ROS All other systems reviewed and are negative.   PHYSICAL EXAM:   VS:  BP 116/72 (BP Location: Left Arm, Patient Position: Sitting)   Pulse 79   Ht 5\' 7"  (1.702 m)   Wt 250 lb 9.6 oz (113.7 kg)   SpO2 94%   BMI 39.25 kg/m      General: Alert, oriented x3, no distress, obese.  Healthy left subclavian defibrillator site. Head: no evidence of trauma, PERRL, EOMI, no exophtalmos or lid lag, no myxedema, no xanthelasma; normal ears, nose and oropharynx Neck: normal jugular venous pulsations and no hepatojugular reflux; brisk carotid pulses without delay and no carotid  bruits Chest: clear to auscultation, no signs of  consolidation by percussion or palpation, normal fremitus, symmetrical and full respiratory excursions Cardiovascular: normal position and quality of the apical impulse, regular rhythm, normal first and second heart sounds, no murmurs, rubs or gallops Abdomen: no tenderness or distention, no masses by palpation, no abnormal pulsatility or arterial bruits, normal bowel sounds, no hepatosplenomegaly Extremities: no clubbing, cyanosis or edema; 2+ radial, ulnar and brachial pulses bilaterally; 2+ right femoral, posterior tibial and dorsalis pedis pulses; 2+ left femoral, posterior tibial and dorsalis pedis pulses; no subclavian or femoral bruits Neurological: grossly nonfocal Psych: Normal mood and affect     Wt Readings from Last 3 Encounters:  07/02/19 250 lb 9.6 oz (113.7 kg)  04/04/19 246 lb 14.6 oz (112 kg)  04/02/19 247 lb (112 kg)      Studies/Labs Reviewed:   EKG:  EKG is not ordered today.  The intracardiac electrogram shows atrial sensed, biventricular paced rhythm.  LABS  December 11, 2018 BUN 42, creatinine 1.5, potassium 3.4  hemoglobin A1c 6.8%  total cholesterol 251, triglycerides 176, HDL 39, LDL 177 Uric acid 6.6, calcium 11.1, normal liver function tests  Her labs from September 22 showed a hemoglobin of 12.9 and a creatinine of 0.92, potassium 3.9.  Hemoglobin A1c 6.7%.  More recent labs from June 13, 2019 show hemoglobin 12.5, glucose 128, sodium 133, creatinine 1.2, BUN 33, normal liver function tests, slight worsening of her hemoglobin A1c to 7.6%.  Normal ionized calcium 4.9.  Most recent lipid profile from June 13, 2019 shows total cholesterol 212, triglycerides 99, HDL 38, LDL 154 (improved from LDL 181 in September).  ASSESSMENT:    1. Chronic combined systolic and diastolic heart failure ([place])   2. Nonischemic cardiomyopathy ([place])   3. History of torsades de pointes   4. Paroxysmal atrial fibrillation (HCC)   5. Essential hypertension   6. Morbid  obesity ([place])   7. Diabetes mellitus type 2 in obese ([place])   8. History of gout   9. Hypercholesterolemia    PLAN:  In order of problems listed above:  1. CHF: Due to her severe obesity, clinical volume assessment has always been a challenge, but by all criteria (comprehensive weight, thoracic impedance, clinical exam, etc.) she appears to be euvolemic.  Her most recent labs actually suggest that she might be a little "dry".  She does not need to take metolazone and has not really taken this in several months.  She is intolerant of RAAS inhibitors.  She is taking spironolactone, high-dose carvedilol.  Give strong consideration to use of an SGLT2 inhibitor, but she really does not want to hear about taking any new medications, especially expensive ones.  I think she is an excellent candidate for [place] or [place]. 2. CMP: Despite the absence of meaningful change in LVEF she had substantial clinical improvement with CRT.  Cardiomyopathy is likely post chemotherapy.  Nonischemic cardiomyopathy with severely depressed left ventricular systolic function that has remained at about 20-25% despite CRRT.   3. VT/TdP: In 2016 she had torsade de pointes in the setting of major electrolyte abnormalities and heart failure exacerbation.  None since that time. 4. AFib: Stopped Xarelto due to cost and did not want to take Coumadin anymore.  Subsequently developed a lot of problems with dysfunctional uterine bleeding, which only now seem to be settling down.  Hemoglobin is stable.  She has had a couple of episodes of atrial fibrillation in the last 4 months, but the  overall burden has been less than 1%.  She has never had embolic events.  She remains very resistant to adding medications, especially due to cost.  We will try to obtain manufacturer financial assistance for Xarelto.  CHADSVasc 5 (age, gender, DM, CHF, HTN). 5. HTN: Very well controlled. 6. Morbid obesity: She is now only in the severely obese range but  with multiple comorbid conditions.  Her weight has varied broadly over the years, often paralleling changes in her psychological status. 7. DM: Control is excellent until recently and her most recent A1c of 7.6% is not too bad historically. 8. Gout: Has not had recent attacks. 9. HLP: Her primary care physician has been encouraging her to take [place] or Praluent and I have told her that I think this is an excellent choice.  As described above she really does not want to add more medications, especially if they are expensive.  Fortunately, today she has not had established coronary or peripheral vascular disease.   Medication Adjustments/Labs and Tests Ordered: Current medicines are reviewed at length with the patient today.  Concerns regarding medicines are outlined above.  Medication changes, Labs and Tests ordered today are listed in the Patient Instructions below. Patient Instructions  Medication Instructions:  No changes *If you need a refill on your cardiac medications before your next appointment, please call your pharmacy*  Lab Work: None ordered If you have labs (blood work) drawn today and your tests are completely normal, you will receive your results only by: Marland Kitchen MyChart Message (if you have MyChart) OR . A paper copy in the mail If you have any lab test that is abnormal or we need to change your treatment, we will call you to review the results.  Testing/Procedures: None ordered  Follow-Up: At [facility], you and your health needs are our priority.  As part of our continuing mission to provide you with exceptional heart care, we have created designated Provider Care Teams.  These Care Teams include your primary Cardiologist (physician) and Advanced Practice Providers (APPs -  Physician Assistants and Nurse Practitioners) who all work together to provide you with the care you need, when you need it.  Your next appointment:   6 month(s)  The format for your next appointment:    In Person  Provider:   Sanda Klein, MD         Signed, Sanda Klein, MD  07/02/2019 6:34 PM    [address], [place], [place]  [address] Phone: [phone]; Fax: [phone]

## 2019-07-02 NOTE — Patient Instructions (Signed)
Medication Instructions:  No changes *If you need a refill on your cardiac medications before your next appointment, please call your pharmacy*  Lab Work: None ordered If you have labs (blood work) drawn today and your tests are completely normal, you will receive your results only by: Marland Kitchen MyChart Message (if you have MyChart) OR . A paper copy in the mail If you have any lab test that is abnormal or we need to change your treatment, we will call you to review the results.  Testing/Procedures: None ordered  Follow-Up: At [facility], you and your health needs are our priority.  As part of our continuing mission to provide you with exceptional heart care, we have created designated Provider Care Teams.  These Care Teams include your primary Cardiologist (physician) and Advanced Practice Providers (APPs -  Physician Assistants and Nurse Practitioners) who all work together to provide you with the care you need, when you need it.  Your next appointment:   6 month(s)  The format for your next appointment:   In Person  Provider:   Sanda Klein, MD

## 2019-07-16 ENCOUNTER — Other Ambulatory Visit: Payer: Self-pay | Admitting: Cardiovascular Disease

## 2019-07-19 ENCOUNTER — Other Ambulatory Visit: Payer: Self-pay

## 2019-08-26 ENCOUNTER — Ambulatory Visit (INDEPENDENT_AMBULATORY_CARE_PROVIDER_SITE_OTHER): Payer: Medicare Other | Admitting: *Deleted

## 2019-08-26 DIAGNOSIS — I472 Ventricular tachycardia, unspecified: Secondary | ICD-10-CM

## 2019-08-26 LAB — CUP PACEART REMOTE DEVICE CHECK
Battery Remaining Longevity: 43 mo
Battery Remaining Percentage: 72 %
Battery Voltage: 2.96 V
Brady Statistic AP VP Percent: 1.6 %
Brady Statistic AP VS Percent: 1 %
Brady Statistic AS VP Percent: 97 %
Brady Statistic AS VS Percent: 1 %
Brady Statistic RA Percent Paced: 1.4 %
Date Time Interrogation Session: 20210215020027
HighPow Impedance: 81 Ohm
HighPow Impedance: 81 Ohm
Implantable Lead Implant Date: 20130603
Implantable Lead Implant Date: 20130603
Implantable Lead Implant Date: 20130603
Implantable Lead Location: 753858
Implantable Lead Location: 753859
Implantable Lead Location: 753860
Implantable Pulse Generator Implant Date: 20190816
Lead Channel Impedance Value: 340 Ohm
Lead Channel Impedance Value: 550 Ohm
Lead Channel Impedance Value: 700 Ohm
Lead Channel Pacing Threshold Amplitude: 0.75 V
Lead Channel Pacing Threshold Amplitude: 1 V
Lead Channel Pacing Threshold Amplitude: 2.5 V
Lead Channel Pacing Threshold Pulse Width: 0.5 ms
Lead Channel Pacing Threshold Pulse Width: 0.5 ms
Lead Channel Pacing Threshold Pulse Width: 1 ms
Lead Channel Sensing Intrinsic Amplitude: 12 mV
Lead Channel Sensing Intrinsic Amplitude: 2.3 mV
Lead Channel Setting Pacing Amplitude: 2 V
Lead Channel Setting Pacing Amplitude: 2 V
Lead Channel Setting Pacing Amplitude: 3.75 V
Lead Channel Setting Pacing Pulse Width: 0.5 ms
Lead Channel Setting Pacing Pulse Width: 1 ms
Lead Channel Setting Sensing Sensitivity: 0.5 mV
Pulse Gen Serial Number: 9842973

## 2019-08-27 NOTE — [place] Notes (Signed)
ICD Remote  

## 2019-08-28 ENCOUNTER — Telehealth: Payer: Self-pay | Admitting: *Deleted

## 2019-08-28 NOTE — Telephone Encounter (Signed)
The patient recently had some atrial fibrillation episodes on her download. She was called and asked to either start Xarelto 20 mg daily or Eliquis 5 mg twice daily. She refuses to take either one stating that she will take her chances because she will not be starting anymore medications. She was educated on the reason for the medication and atrial fibrillation but has still refused stating that she has had this discussion in the past and will not discuss it any further.

## 2019-09-09 DIAGNOSIS — Z23 Encounter for immunization: Secondary | ICD-10-CM | POA: Diagnosis not present

## 2019-09-16 ENCOUNTER — Emergency Department (HOSPITAL_COMMUNITY): Payer: Medicare Other

## 2019-09-16 ENCOUNTER — Inpatient Hospital Stay (HOSPITAL_COMMUNITY)
Admission: EM | Admit: 2019-09-16 | Discharge: 2019-09-26 | DRG: 308 | Disposition: A | Discharge status: Home Health | Payer: Medicare Other | Attending: Internal Medicine | Admitting: Internal Medicine

## 2019-09-16 ENCOUNTER — Telehealth: Payer: Self-pay | Admitting: Emergency Medicine

## 2019-09-16 ENCOUNTER — Encounter (HOSPITAL_COMMUNITY): Payer: Self-pay

## 2019-09-16 DIAGNOSIS — I4581 Long QT syndrome: Secondary | ICD-10-CM | POA: Diagnosis not present

## 2019-09-16 DIAGNOSIS — E871 Hypo-osmolality and hyponatremia: Secondary | ICD-10-CM | POA: Diagnosis not present

## 2019-09-16 DIAGNOSIS — I447 Left bundle-branch block, unspecified: Secondary | ICD-10-CM | POA: Diagnosis present

## 2019-09-16 DIAGNOSIS — Z87891 Personal history of nicotine dependence: Secondary | ICD-10-CM

## 2019-09-16 DIAGNOSIS — Z79899 Other long term (current) drug therapy: Secondary | ICD-10-CM

## 2019-09-16 DIAGNOSIS — I110 Hypertensive heart disease with heart failure: Secondary | ICD-10-CM | POA: Diagnosis not present

## 2019-09-16 DIAGNOSIS — Z853 Personal history of malignant neoplasm of breast: Secondary | ICD-10-CM

## 2019-09-16 DIAGNOSIS — I5023 Acute on chronic systolic (congestive) heart failure: Secondary | ICD-10-CM | POA: Diagnosis not present

## 2019-09-16 DIAGNOSIS — E161 Other hypoglycemia: Secondary | ICD-10-CM | POA: Diagnosis not present

## 2019-09-16 DIAGNOSIS — E1165 Type 2 diabetes mellitus with hyperglycemia: Secondary | ICD-10-CM | POA: Diagnosis not present

## 2019-09-16 DIAGNOSIS — I4819 Other persistent atrial fibrillation: Secondary | ICD-10-CM | POA: Diagnosis not present

## 2019-09-16 DIAGNOSIS — E876 Hypokalemia: Secondary | ICD-10-CM | POA: Diagnosis present

## 2019-09-16 DIAGNOSIS — I4891 Unspecified atrial fibrillation: Secondary | ICD-10-CM | POA: Diagnosis not present

## 2019-09-16 DIAGNOSIS — I5043 Acute on chronic combined systolic (congestive) and diastolic (congestive) heart failure: Secondary | ICD-10-CM | POA: Diagnosis present

## 2019-09-16 DIAGNOSIS — E1122 Type 2 diabetes mellitus with diabetic chronic kidney disease: Secondary | ICD-10-CM | POA: Diagnosis present

## 2019-09-16 DIAGNOSIS — I071 Rheumatic tricuspid insufficiency: Secondary | ICD-10-CM

## 2019-09-16 DIAGNOSIS — T451X5D Adverse effect of antineoplastic and immunosuppressive drugs, subsequent encounter: Secondary | ICD-10-CM

## 2019-09-16 DIAGNOSIS — N939 Abnormal uterine and vaginal bleeding, unspecified: Secondary | ICD-10-CM | POA: Diagnosis present

## 2019-09-16 DIAGNOSIS — E785 Hyperlipidemia, unspecified: Secondary | ICD-10-CM | POA: Diagnosis present

## 2019-09-16 DIAGNOSIS — Z881 Allergy status to other antibiotic agents status: Secondary | ICD-10-CM

## 2019-09-16 DIAGNOSIS — Z5320 Procedure and treatment not carried out because of patient's decision for unspecified reasons: Secondary | ICD-10-CM | POA: Diagnosis not present

## 2019-09-16 DIAGNOSIS — Z20822 Contact with and (suspected) exposure to covid-19: Secondary | ICD-10-CM | POA: Diagnosis present

## 2019-09-16 DIAGNOSIS — I700 Atherosclerosis of aorta: Secondary | ICD-10-CM | POA: Diagnosis present

## 2019-09-16 DIAGNOSIS — Z95810 Presence of automatic (implantable) cardiac defibrillator: Secondary | ICD-10-CM

## 2019-09-16 DIAGNOSIS — Z975 Presence of (intrauterine) contraceptive device: Secondary | ICD-10-CM

## 2019-09-16 DIAGNOSIS — I959 Hypotension, unspecified: Secondary | ICD-10-CM | POA: Diagnosis not present

## 2019-09-16 DIAGNOSIS — Z950 Presence of cardiac pacemaker: Secondary | ICD-10-CM

## 2019-09-16 DIAGNOSIS — Z8679 Personal history of other diseases of the circulatory system: Secondary | ICD-10-CM

## 2019-09-16 DIAGNOSIS — I2510 Atherosclerotic heart disease of native coronary artery without angina pectoris: Secondary | ICD-10-CM | POA: Diagnosis present

## 2019-09-16 DIAGNOSIS — Z923 Personal history of irradiation: Secondary | ICD-10-CM

## 2019-09-16 DIAGNOSIS — E11641 Type 2 diabetes mellitus with hypoglycemia with coma: Secondary | ICD-10-CM | POA: Diagnosis not present

## 2019-09-16 DIAGNOSIS — E162 Hypoglycemia, unspecified: Secondary | ICD-10-CM | POA: Diagnosis not present

## 2019-09-16 DIAGNOSIS — Z9221 Personal history of antineoplastic chemotherapy: Secondary | ICD-10-CM

## 2019-09-16 DIAGNOSIS — R0602 Shortness of breath: Secondary | ICD-10-CM | POA: Diagnosis not present

## 2019-09-16 DIAGNOSIS — Z833 Family history of diabetes mellitus: Secondary | ICD-10-CM

## 2019-09-16 DIAGNOSIS — N179 Acute kidney failure, unspecified: Secondary | ICD-10-CM | POA: Diagnosis not present

## 2019-09-16 DIAGNOSIS — T383X5A Adverse effect of insulin and oral hypoglycemic [antidiabetic] drugs, initial encounter: Secondary | ICD-10-CM | POA: Diagnosis present

## 2019-09-16 DIAGNOSIS — K5900 Constipation, unspecified: Secondary | ICD-10-CM | POA: Diagnosis not present

## 2019-09-16 DIAGNOSIS — I427 Cardiomyopathy due to drug and external agent: Secondary | ICD-10-CM | POA: Diagnosis present

## 2019-09-16 DIAGNOSIS — M109 Gout, unspecified: Secondary | ICD-10-CM | POA: Diagnosis present

## 2019-09-16 DIAGNOSIS — Z9013 Acquired absence of bilateral breasts and nipples: Secondary | ICD-10-CM

## 2019-09-16 DIAGNOSIS — Z885 Allergy status to narcotic agent status: Secondary | ICD-10-CM

## 2019-09-16 DIAGNOSIS — Z8249 Family history of ischemic heart disease and other diseases of the circulatory system: Secondary | ICD-10-CM

## 2019-09-16 DIAGNOSIS — R000 Tachycardia, unspecified: Secondary | ICD-10-CM | POA: Diagnosis not present

## 2019-09-16 DIAGNOSIS — I340 Nonrheumatic mitral (valve) insufficiency: Secondary | ICD-10-CM

## 2019-09-16 DIAGNOSIS — Q211 Atrial septal defect: Secondary | ICD-10-CM

## 2019-09-16 DIAGNOSIS — E160 Drug-induced hypoglycemia without coma: Secondary | ICD-10-CM | POA: Diagnosis present

## 2019-09-16 DIAGNOSIS — I130 Hypertensive heart and chronic kidney disease with heart failure and stage 1 through stage 4 chronic kidney disease, or unspecified chronic kidney disease: Secondary | ICD-10-CM | POA: Diagnosis present

## 2019-09-16 DIAGNOSIS — Z888 Allergy status to other drugs, medicaments and biological substances status: Secondary | ICD-10-CM

## 2019-09-16 DIAGNOSIS — G8929 Other chronic pain: Secondary | ICD-10-CM | POA: Diagnosis present

## 2019-09-16 DIAGNOSIS — E11649 Type 2 diabetes mellitus with hypoglycemia without coma: Secondary | ICD-10-CM | POA: Diagnosis present

## 2019-09-16 DIAGNOSIS — Z6838 Body mass index (BMI) 38.0-38.9, adult: Secondary | ICD-10-CM

## 2019-09-16 DIAGNOSIS — Z794 Long term (current) use of insulin: Secondary | ICD-10-CM

## 2019-09-16 DIAGNOSIS — Z9641 Presence of insulin pump (external) (internal): Secondary | ICD-10-CM | POA: Diagnosis present

## 2019-09-16 DIAGNOSIS — N1832 Chronic kidney disease, stage 3b: Secondary | ICD-10-CM | POA: Diagnosis present

## 2019-09-16 DIAGNOSIS — R0902 Hypoxemia: Secondary | ICD-10-CM | POA: Diagnosis not present

## 2019-09-16 DIAGNOSIS — I083 Combined rheumatic disorders of mitral, aortic and tricuspid valves: Secondary | ICD-10-CM | POA: Diagnosis present

## 2019-09-16 HISTORY — DX: Unspecified atrial fibrillation: I48.91

## 2019-09-16 LAB — CBG MONITORING, ED: Glucose-Capillary: 69 mg/dL — ABNORMAL LOW (ref 70–99)

## 2019-09-16 MED ORDER — ASPIRIN 81 MG PO CHEW
324.0000 mg | CHEWABLE_TABLET | Freq: Once | ORAL | Status: AC
  Administered 2019-09-17: 01:00:00 324 mg via ORAL
  Filled 2019-09-16: qty 4

## 2019-09-16 NOTE — ED Provider Notes (Signed)
TIME SEEN: 11:50 PM  CHIEF COMPLAINT: Shortness of breath, hypoglycemia  HPI: Patient is a 69 year old female with history of CHF status post AICD, chronic kidney disease, diabetes, hypertension, hyperlipidemia, paroxysmal atrial fibrillation not on antiarrhythmics or anticoagulation who presents to the emergency department with [facility] for complaints of shortness of breath that has been ongoing all day today.  Also has an insulin pump and states her blood sugars have been in the 60s to 70s.  She turned her pump off at 4 PM today.  She denies any chest pain or chest discomfort.  No fevers, diaphoresis, sweating, cough, lower extremity swelling or pain.  EMS reports patient was in A. fib with RVR with a rate between 70s and 140s.  No medications given in route.  She is seen by Dr. Sallyanne Kuster with [facility] health cardiology.  PCP - Dr. Deland Pretty  ROS: See HPI Constitutional: no fever  Eyes: no drainage  ENT: no runny nose   Cardiovascular:  no chest pain  Resp:  SOB  GI: no vomiting; + nausea GU: no dysuria Integumentary: no rash  Allergy: no hives  Musculoskeletal: no leg swelling  Neurological: no slurred speech ROS otherwise negative  PAST MEDICAL HISTORY/PAST SURGICAL HISTORY:  Past Medical History:  Diagnosis Date  . Angina   . Anxiety   . Arthritis   . Automatic implantable cardioverter-defibrillator in situ   . Breast cancer ([place])   . Cardiomyopathy secondary to chemotherapy [facility])    a.  doxurubicin;  b. s/p SJM Quadra Assura CRT-D, model [id number] Q, Ser# [id number]  . CHF (congestive heart failure) ([place])   . Chronic kidney disease    Stage 3  . Chronic systolic heart failure ([place])   . Coronary artery disease   . Depression   . DM2 (diabetes mellitus, type 2) ([place])   . Goiter   . Gout   . Hyperlipidemia   . Hypertension   . Invasive ductal carcinoma of breast ([place])   . LBBB (left bundle branch block)    evidnece of RBBB fall 2012  . Nonischemic cardiomyopathy  (H[facility])    anthracycline-related  . PAF (paroxysmal atrial fibrillation) ([place])   . PONV (postoperative nausea and vomiting)    nausea post ICD placement relieved with Zofran  . Shortness of breath   . Syncope     MEDICATIONS:  Prior to Admission medications   Medication Sig Start Date End Date Taking? Authorizing Provider  allopurinol (ZYLOPRIM) 300 MG tablet Take 450 mg by mouth daily.     [provider]  carvedilol (COREG) 25 MG tablet Take 1 tablet (25 mg total) by mouth 2 (two) times daily. 10/10/18   Croitoru, Mihai, MD  Cholecalciferol (VITAMIN D) 2000 units CAPS Take 4,000 Units by mouth daily.     [provider]  colchicine 0.6 MG tablet Take 0.6 mg by mouth daily as needed (for gout).     [provider]  diphenhydrAMINE (BENADRYL) 25 MG tablet Take 25 mg by mouth every 8 (eight) hours as needed for allergies.    [provider]  Lutein 20 MG CAPS Take 20 mg by mouth daily.     [provider]  magnesium oxide (MAG-OX) 400 (241.3 Mg) MG tablet TAKE 2 TABLETS BY MOUTH ONCE DAILY ON EVEN DAYS AND TAKE 1 TABLET ONCE DAILY ON ODD DAYS. 05/16/19   Croitoru, Mihai, MD  metolazone (ZAROXOLYN) 5 MG tablet TAKE 1 TABLET BY MOUTH ONCE DAILY AS NEEDED (TAKE IF WEIGHT IS  OVER 255 POUNDS) 05/13/19   Croitoru, Mihai, MD  NOVOLIN R 100 UNIT/ML injection Inject 200 Units into the skin See admin instructions. Max 200 units via insulin pump 01/12/18   [provider]  potassium chloride SA (K-DUR) 20 MEQ tablet Take 2 tablets (40 mEq total) by mouth daily. 01/07/19   Croitoru, Mihai, MD  spironolactone (ALDACTONE) 50 MG tablet Take 1 tablet by mouth once daily 06/17/19   Croitoru, Mihai, MD  torsemide (DEMADEX) 100 MG tablet Take 1 tablet by mouth once daily 07/17/19   Croitoru, Dani Gobble, MD    ALLERGIES:  Allergies  Allergen Reactions  . Lasix [Furosemide] Other (See Comments)    Causes gout  . Ace Inhibitors Cough  . Codeine Other (See Comments)     Crazy  . Digitalis Other (See Comments)    "saw yellow circles"  . Levaquin [Levofloxacin In D5w] Other (See Comments)    Gout   . Other Other (See Comments)    Nitro spray: reaction unknown  . Statins Other (See Comments)    Reaction unknown  . Xarelto [Rivaroxaban]   . Zyrtec [Cetirizine] Other (See Comments)    Slept for a day then was up for 4 days afterwards    SOCIAL HISTORY:  Social History   Tobacco Use  . Smoking status: Former Research scientist (life sciences)  . Smokeless tobacco: Never Used  Substance Use Topics  . Alcohol use: No    Alcohol/week: 0.0 standard drinks    FAMILY HISTORY: Family History  Problem Relation Age of Onset  . Heart attack Father   . Hypertension Brother   . Diabetes Brother     EXAM: Pulse (!) 122   Temp 98.9 F (37.2 C) (Oral)   Resp 18   SpO2 96%  CONSTITUTIONAL: Alert and oriented and responds appropriately to questions.  Obese, in no significant distress HEAD: Normocephalic EYES: Conjunctivae clear, pupils appear equal, EOM appear intact ENT: normal nose; moist mucous membranes NECK: Supple, normal ROM CARD: Irregularly irregular and tachycardic; S1 and S2 appreciated; no murmurs, no clicks, no rubs, no gallops RESP: Patient is mildly tachypneic, diminished breath sounds at her bases bilaterally but otherwise breath sounds clear and equal bilaterally; no wheezes, no rhonchi, no rales, no hypoxia or respiratory distress, speaking full sentences ABD/GI: Normal bowel sounds; non-distended; soft, non-tender, no rebound, no guarding, no peritoneal signs, no hepatosplenomegaly BACK:  The back appears normal EXT: Normal ROM in all joints; no deformity noted, no edema; no cyanosis, no calf tenderness or calf swelling SKIN: Normal color for age and race; warm; no rash on exposed skin NEURO: Moves all extremities equally PSYCH: The patient's mood and manner are appropriate.   MEDICAL DECISION MAKING: Patient here with shortness of breath, A. fib with RVR and  hypoglycemia.  Blood sugar here 69.  Will encourage p.o. intake.  Initial EKG shows that she is not in a paced rhythm and is reading as an acute MI.  She has a left bundle branch block and does not meet [place] criteria but does have abnormal morphology of the inferior ST segments.  Will discuss with cardiology on-call.  Will obtain cardiac labs, chest x-ray and start diltiazem infusion.  ED PROGRESS:   12:05 AM  Spoke with Dr. Kalman Shan on call with cardiology.  He has reviewed EKG.  States this does not look like an acute MI.  Agrees with medical admission and rate control.  Chest x-ray shows findings of CHF exacerbation.  She is on torsemide 100 mg daily.  Will give Lasix 80 mg IV x1.  1:45 AM  Pt reports feeling better.  Currently diuresing and tachypnea has improved.  Heart rate now in the 80s still in atrial fibrillation.  Labs show creatinine of 1.8 up from previous in 2020.  BNP is 1100.9.  Troponin slightly elevated at 39 but suspect this is from CHF exacerbation and A. fib with RVR.  Will admit to medicine.  Updated her daughter by phone.   2:07 AM Discussed patient's case with hospitalist, Dr. Myna Hidalgo.  I have recommended admission and patient (and family if present) agree with this plan. Admitting physician will place admission orders.   I reviewed all nursing notes, vitals, pertinent previous records and interpreted all EKGs, lab and urine results, imaging (as available).    EKG Interpretation  Date/Time:  Monday September 16 2019 23:45:31 EST Ventricular Rate:  125 PR Interval:    QRS Duration: 191 QT Interval:  398 QTC Calculation: 574 R Axis:   -95 Text Interpretation: Atrial fibrillation Nonspecific IVCD with LAD No STEMI present Confirmed by Chaunce Winkels, Cyril Mourning [phone]) on 09/17/2019 12:09:01 AM        CRITICAL CARE Performed by: Cyril Mourning Thornton Dohrmann   Total critical care time: 45 minutes  Critical care time was exclusive of separately billable procedures and treating other  patients.  Critical care was necessary to treat or prevent imminent or life-threatening deterioration.  Critical care was time spent personally by me on the following activities: development of treatment plan with patient and/or surrogate as well as nursing, discussions with consultants, evaluation of patient's response to treatment, examination of patient, obtaining history from patient or surrogate, ordering and performing treatments and interventions, ordering and review of laboratory studies, ordering and review of radiographic studies, pulse oximetry and re-evaluation of patient's condition.   JASIYA MARKIE was evaluated in Emergency Department on 09/16/2019 for the symptoms described in the history of present illness. She was evaluated in the context of the global COVID-19 pandemic, which necessitated consideration that the patient might be at risk for infection with the SARS-CoV-2 virus that causes COVID-19. Institutional protocols and algorithms that pertain to the evaluation of patients at risk for COVID-19 are in a state of rapid change based on information released by regulatory bodies including the CDC and federal and state organizations. These policies and algorithms were followed during the patient's care in the ED.  Patient was seen wearing N95, face shield, gloves.    Vernal Rutan, Delice Bison, DO 09/17/19 0207

## 2019-09-16 NOTE — ED Triage Notes (Signed)
[facility]  Pt initially called out because she felt SOB at home like she couldn't take in a deep breath.   Pt also endorses having a fib. Per her pacemaker that started on and off from the 09/11/19.    Pt a&ox4

## 2019-09-16 NOTE — Telephone Encounter (Signed)
Received alert for long AT/AF episodes from 09/13/19  To 09/15/19. On 09/13/19 patient had AF with RVR averaging 142 bpm that lasted 8 hrs and 41 minutes. Currently in AF with controlled v-rates.Patient reports she has felt unwell since receiving her [place] Covid -19 vaccine 09/09/19. She reports that she has had vomiting , general weakness , and tachycardia intermittently with episodes of hypoglycemia with blood sugars in the 40s. Patient reports she has substernal chest pain that is non-radiating and sob with all activity. She has been sleeping in her recliner for the last week. Recommendation made for patient to call EMS to be taken to nearest ED for evaluation and treatment. Patient reports she will not go to the ED  and will wait to be seen at appointment with her endocrinologist on Thursday 09/19/19. Patient reports no missed doses of Coreg and has declined [place] in the past. Stressed importance of being evaluated ASAP for CP, SOB, and hypoglycemia but patient is choosing to wait for scheduled appointment with MD on Thursday 09/19/19.

## 2019-09-17 ENCOUNTER — Inpatient Hospital Stay (HOSPITAL_COMMUNITY): Payer: Medicare Other

## 2019-09-17 ENCOUNTER — Encounter (HOSPITAL_COMMUNITY): Payer: Self-pay | Admitting: Family Medicine

## 2019-09-17 DIAGNOSIS — I4891 Unspecified atrial fibrillation: Secondary | ICD-10-CM

## 2019-09-17 DIAGNOSIS — I5042 Chronic combined systolic (congestive) and diastolic (congestive) heart failure: Secondary | ICD-10-CM | POA: Diagnosis not present

## 2019-09-17 DIAGNOSIS — T451X5D Adverse effect of antineoplastic and immunosuppressive drugs, subsequent encounter: Secondary | ICD-10-CM | POA: Diagnosis not present

## 2019-09-17 DIAGNOSIS — E160 Drug-induced hypoglycemia without coma: Secondary | ICD-10-CM | POA: Diagnosis not present

## 2019-09-17 DIAGNOSIS — Q211 Atrial septal defect: Secondary | ICD-10-CM | POA: Diagnosis not present

## 2019-09-17 DIAGNOSIS — N179 Acute kidney failure, unspecified: Secondary | ICD-10-CM | POA: Diagnosis not present

## 2019-09-17 DIAGNOSIS — I959 Hypotension, unspecified: Secondary | ICD-10-CM | POA: Diagnosis not present

## 2019-09-17 DIAGNOSIS — I5022 Chronic systolic (congestive) heart failure: Secondary | ICD-10-CM | POA: Diagnosis not present

## 2019-09-17 DIAGNOSIS — E785 Hyperlipidemia, unspecified: Secondary | ICD-10-CM | POA: Diagnosis present

## 2019-09-17 DIAGNOSIS — I700 Atherosclerosis of aorta: Secondary | ICD-10-CM | POA: Diagnosis present

## 2019-09-17 DIAGNOSIS — T383X5A Adverse effect of insulin and oral hypoglycemic [antidiabetic] drugs, initial encounter: Secondary | ICD-10-CM | POA: Diagnosis not present

## 2019-09-17 DIAGNOSIS — I4819 Other persistent atrial fibrillation: Secondary | ICD-10-CM | POA: Diagnosis not present

## 2019-09-17 DIAGNOSIS — I5023 Acute on chronic systolic (congestive) heart failure: Secondary | ICD-10-CM | POA: Diagnosis not present

## 2019-09-17 DIAGNOSIS — I082 Rheumatic disorders of both aortic and tricuspid valves: Secondary | ICD-10-CM | POA: Diagnosis not present

## 2019-09-17 DIAGNOSIS — I340 Nonrheumatic mitral (valve) insufficiency: Secondary | ICD-10-CM | POA: Diagnosis not present

## 2019-09-17 DIAGNOSIS — Z950 Presence of cardiac pacemaker: Secondary | ICD-10-CM

## 2019-09-17 DIAGNOSIS — R0602 Shortness of breath: Secondary | ICD-10-CM | POA: Diagnosis not present

## 2019-09-17 DIAGNOSIS — K5900 Constipation, unspecified: Secondary | ICD-10-CM | POA: Diagnosis not present

## 2019-09-17 DIAGNOSIS — I447 Left bundle-branch block, unspecified: Secondary | ICD-10-CM | POA: Diagnosis present

## 2019-09-17 DIAGNOSIS — N1832 Chronic kidney disease, stage 3b: Secondary | ICD-10-CM | POA: Diagnosis not present

## 2019-09-17 DIAGNOSIS — E876 Hypokalemia: Secondary | ICD-10-CM | POA: Diagnosis present

## 2019-09-17 DIAGNOSIS — E11649 Type 2 diabetes mellitus with hypoglycemia without coma: Secondary | ICD-10-CM | POA: Diagnosis not present

## 2019-09-17 DIAGNOSIS — I4581 Long QT syndrome: Secondary | ICD-10-CM | POA: Diagnosis not present

## 2019-09-17 DIAGNOSIS — I5043 Acute on chronic combined systolic (congestive) and diastolic (congestive) heart failure: Secondary | ICD-10-CM | POA: Diagnosis not present

## 2019-09-17 DIAGNOSIS — I110 Hypertensive heart disease with heart failure: Secondary | ICD-10-CM | POA: Diagnosis not present

## 2019-09-17 DIAGNOSIS — I083 Combined rheumatic disorders of mitral, aortic and tricuspid valves: Secondary | ICD-10-CM | POA: Diagnosis present

## 2019-09-17 DIAGNOSIS — E8770 Fluid overload, unspecified: Secondary | ICD-10-CM | POA: Diagnosis not present

## 2019-09-17 DIAGNOSIS — E871 Hypo-osmolality and hyponatremia: Secondary | ICD-10-CM

## 2019-09-17 DIAGNOSIS — N939 Abnormal uterine and vaginal bleeding, unspecified: Secondary | ICD-10-CM | POA: Diagnosis present

## 2019-09-17 DIAGNOSIS — Z20822 Contact with and (suspected) exposure to covid-19: Secondary | ICD-10-CM | POA: Diagnosis not present

## 2019-09-17 DIAGNOSIS — I2510 Atherosclerotic heart disease of native coronary artery without angina pectoris: Secondary | ICD-10-CM | POA: Diagnosis not present

## 2019-09-17 DIAGNOSIS — I071 Rheumatic tricuspid insufficiency: Secondary | ICD-10-CM | POA: Diagnosis not present

## 2019-09-17 DIAGNOSIS — N183 Chronic kidney disease, stage 3 unspecified: Secondary | ICD-10-CM | POA: Diagnosis not present

## 2019-09-17 DIAGNOSIS — I480 Paroxysmal atrial fibrillation: Secondary | ICD-10-CM | POA: Diagnosis not present

## 2019-09-17 DIAGNOSIS — E1165 Type 2 diabetes mellitus with hyperglycemia: Secondary | ICD-10-CM | POA: Diagnosis not present

## 2019-09-17 DIAGNOSIS — I130 Hypertensive heart and chronic kidney disease with heart failure and stage 1 through stage 4 chronic kidney disease, or unspecified chronic kidney disease: Secondary | ICD-10-CM | POA: Diagnosis not present

## 2019-09-17 DIAGNOSIS — I427 Cardiomyopathy due to drug and external agent: Secondary | ICD-10-CM | POA: Diagnosis not present

## 2019-09-17 DIAGNOSIS — E1122 Type 2 diabetes mellitus with diabetic chronic kidney disease: Secondary | ICD-10-CM | POA: Diagnosis not present

## 2019-09-17 DIAGNOSIS — I361 Nonrheumatic tricuspid (valve) insufficiency: Secondary | ICD-10-CM | POA: Diagnosis not present

## 2019-09-17 DIAGNOSIS — E11641 Type 2 diabetes mellitus with hypoglycemia with coma: Secondary | ICD-10-CM | POA: Diagnosis not present

## 2019-09-17 DIAGNOSIS — Z95810 Presence of automatic (implantable) cardiac defibrillator: Secondary | ICD-10-CM | POA: Diagnosis not present

## 2019-09-17 LAB — CBC
HCT: 40.8 % (ref 36.0–46.0)
HCT: 43.9 % (ref 36.0–46.0)
Hemoglobin: 13.1 g/dL (ref 12.0–15.0)
Hemoglobin: 13.9 g/dL (ref 12.0–15.0)
MCH: 29.8 pg (ref 26.0–34.0)
MCH: 30.1 pg (ref 26.0–34.0)
MCHC: 31.7 g/dL (ref 30.0–36.0)
MCHC: 32.1 g/dL (ref 30.0–36.0)
MCV: 93.8 fL (ref 80.0–100.0)
MCV: 94 fL (ref 80.0–100.0)
Platelets: 258 10*3/uL (ref 150–400)
Platelets: 286 10*3/uL (ref 150–400)
RBC: 4.35 MIL/uL (ref 3.87–5.11)
RBC: 4.67 MIL/uL (ref 3.87–5.11)
RDW: 14.6 % (ref 11.5–15.5)
RDW: 14.7 % (ref 11.5–15.5)
WBC: 11 10*3/uL — ABNORMAL HIGH (ref 4.0–10.5)
WBC: 11.8 10*3/uL — ABNORMAL HIGH (ref 4.0–10.5)
nRBC: 0 % (ref 0.0–0.2)
nRBC: 0 % (ref 0.0–0.2)

## 2019-09-17 LAB — BASIC METABOLIC PANEL
Anion gap: 15 (ref 5–15)
Anion gap: 19 — ABNORMAL HIGH (ref 5–15)
BUN: 48 mg/dL — ABNORMAL HIGH (ref 8–23)
BUN: 52 mg/dL — ABNORMAL HIGH (ref 8–23)
CO2: 23 mmol/L (ref 22–32)
CO2: 22 mmol/L (ref 22–32)
Calcium: 9.7 mg/dL (ref 8.9–10.3)
Calcium: 9.6 mg/dL (ref 8.9–10.3)
Chloride: 90 mmol/L — ABNORMAL LOW (ref 98–111)
Chloride: 87 mmol/L — ABNORMAL LOW (ref 98–111)
Creatinine, Ser: 1.82 mg/dL — ABNORMAL HIGH (ref 0.44–1.00)
Creatinine, Ser: 1.88 mg/dL — ABNORMAL HIGH (ref 0.44–1.00)
GFR calc Af Amer: 33 mL/min — ABNORMAL LOW (ref >60)
GFR calc Af Amer: 31 mL/min — ABNORMAL LOW (ref >60)
GFR calc non Af Amer: 28 mL/min — ABNORMAL LOW (ref >60)
GFR calc non Af Amer: 27 mL/min — ABNORMAL LOW (ref >60)
Glucose, Bld: 69 mg/dL — ABNORMAL LOW (ref 70–99)
Glucose, Bld: 113 mg/dL — ABNORMAL HIGH (ref 70–99)
Potassium: 4 mmol/L (ref 3.5–5.1)
Potassium: 4 mmol/L (ref 3.5–5.1)
Sodium: 128 mmol/L — ABNORMAL LOW (ref 135–145)
Sodium: 128 mmol/L — ABNORMAL LOW (ref 135–145)

## 2019-09-17 LAB — TROPONIN I (HIGH SENSITIVITY)
Troponin I (High Sensitivity): 39 ng/L — ABNORMAL HIGH (ref <18)
Troponin I (High Sensitivity): 38 ng/L — ABNORMAL HIGH (ref <18)

## 2019-09-17 LAB — BRAIN NATRIURETIC PEPTIDE: B Natriuretic Peptide: 1100.9 pg/mL — ABNORMAL HIGH (ref 0.0–100.0)

## 2019-09-17 LAB — TSH: TSH: 3.627 u[IU]/mL (ref 0.350–4.500)

## 2019-09-17 LAB — ECHOCARDIOGRAM COMPLETE

## 2019-09-17 LAB — GLUCOSE, CAPILLARY
Glucose-Capillary: 229 mg/dL — ABNORMAL HIGH (ref 70–99)
Glucose-Capillary: 269 mg/dL — ABNORMAL HIGH (ref 70–99)

## 2019-09-17 LAB — CBG MONITORING, ED
Glucose-Capillary: 107 mg/dL — ABNORMAL HIGH (ref 70–99)
Glucose-Capillary: 118 mg/dL — ABNORMAL HIGH (ref 70–99)
Glucose-Capillary: 105 mg/dL — ABNORMAL HIGH (ref 70–99)
Glucose-Capillary: 139 mg/dL — ABNORMAL HIGH (ref 70–99)
Glucose-Capillary: 222 mg/dL — ABNORMAL HIGH (ref 70–99)

## 2019-09-17 LAB — HEMOGLOBIN A1C
Hgb A1c MFr Bld: 7.2 % — ABNORMAL HIGH (ref 4.8–5.6)
Mean Plasma Glucose: 159.94 mg/dL

## 2019-09-17 LAB — SARS CORONAVIRUS 2 (TAT 6-24 HRS): SARS Coronavirus 2: NEGATIVE

## 2019-09-17 LAB — MAGNESIUM: Magnesium: 2.3 mg/dL (ref 1.7–2.4)

## 2019-09-17 LAB — HIV ANTIBODY (ROUTINE TESTING W REFLEX): HIV Screen 4th Generation wRfx: NONREACTIVE

## 2019-09-17 MED ORDER — INSULIN ASPART 100 UNIT/ML ~~LOC~~ SOLN
0.0000 [IU] | Freq: Three times a day (TID) | SUBCUTANEOUS | Status: DC
  Administered 2019-09-17 – 2019-09-18 (×2): 5 [IU] via SUBCUTANEOUS
  Administered 2019-09-18 – 2019-09-20 (×6): 3 [IU] via SUBCUTANEOUS
  Administered 2019-09-20: 2 [IU] via SUBCUTANEOUS
  Administered 2019-09-21: 1 [IU] via SUBCUTANEOUS
  Administered 2019-09-21: 2 [IU] via SUBCUTANEOUS
  Administered 2019-09-21 – 2019-09-22 (×3): 1 [IU] via SUBCUTANEOUS
  Administered 2019-09-22 – 2019-09-23 (×2): 3 [IU] via SUBCUTANEOUS
  Administered 2019-09-24 – 2019-09-25 (×2): 2 [IU] via SUBCUTANEOUS

## 2019-09-17 MED ORDER — ALLOPURINOL 300 MG PO TABS
450.0000 mg | ORAL_TABLET | Freq: Every day | ORAL | Status: DC
  Administered 2019-09-17 – 2019-09-26 (×10): 450 mg via ORAL
  Filled 2019-09-17 (×7): qty 2
  Filled 2019-09-17: qty 1.5
  Filled 2019-09-17 (×2): qty 2

## 2019-09-17 MED ORDER — ZOLPIDEM TARTRATE 5 MG PO TABS
5.0000 mg | ORAL_TABLET | Freq: Every evening | ORAL | Status: DC | PRN
  Administered 2019-09-18 – 2019-09-21 (×3): 5 mg via ORAL
  Filled 2019-09-17 (×4): qty 1

## 2019-09-17 MED ORDER — CARVEDILOL 25 MG PO TABS
25.0000 mg | ORAL_TABLET | Freq: Two times a day (BID) | ORAL | Status: DC
  Administered 2019-09-17 – 2019-09-18 (×3): 25 mg via ORAL
  Filled 2019-09-17 (×2): qty 1
  Filled 2019-09-17: qty 2

## 2019-09-17 MED ORDER — ACETAMINOPHEN 325 MG PO TABS: 650.0000 mg | ORAL_TABLET | Freq: Four times a day (QID) | ORAL | Status: DC | PRN

## 2019-09-17 MED ORDER — ONDANSETRON HCL 4 MG PO TABS
4.0000 mg | ORAL_TABLET | Freq: Four times a day (QID) | ORAL | Status: DC | PRN
  Administered 2019-09-18: 4 mg via ORAL
  Filled 2019-09-17: qty 1

## 2019-09-17 MED ORDER — INSULIN GLARGINE 100 UNIT/ML ~~LOC~~ SOLN
35.0000 [IU] | Freq: Every day | SUBCUTANEOUS | Status: DC
  Administered 2019-09-17: 35 [IU] via SUBCUTANEOUS
  Filled 2019-09-17 (×3): qty 0.35

## 2019-09-17 MED ORDER — FUROSEMIDE 10 MG/ML IJ SOLN
80.0000 mg | Freq: Once | INTRAMUSCULAR | Status: AC
  Administered 2019-09-17: 80 mg via INTRAVENOUS
  Filled 2019-09-17: qty 8

## 2019-09-17 MED ORDER — SODIUM CHLORIDE 0.9 % IV SOLN: 250.0000 mL | INTRAVENOUS | Status: DC | PRN

## 2019-09-17 MED ORDER — PERFLUTREN LIPID MICROSPHERE
1.0000 mL | INTRAVENOUS | Status: AC | PRN
  Administered 2019-09-17: 2 mL via INTRAVENOUS
  Filled 2019-09-17: qty 10

## 2019-09-17 MED ORDER — SODIUM CHLORIDE 0.9% FLUSH
3.0000 mL | Freq: Two times a day (BID) | INTRAVENOUS | Status: DC
  Administered 2019-09-17 – 2019-09-18 (×3): 3 mL via INTRAVENOUS

## 2019-09-17 MED ORDER — HYDROCODONE-ACETAMINOPHEN 5-325 MG PO TABS: 1.0000 | ORAL_TABLET | ORAL | Status: DC | PRN

## 2019-09-17 MED ORDER — DILTIAZEM HCL-DEXTROSE 125-5 MG/125ML-% IV SOLN (PREMIX)
5.0000 mg/h | INTRAVENOUS | Status: DC
  Administered 2019-09-17 – 2019-09-20 (×4): 5 mg/h via INTRAVENOUS
  Filled 2019-09-17 (×5): qty 125

## 2019-09-17 MED ORDER — DILTIAZEM LOAD VIA INFUSION
10.0000 mg | Freq: Once | INTRAVENOUS | Status: AC
  Administered 2019-09-17: 10 mg via INTRAVENOUS
  Filled 2019-09-17: qty 10

## 2019-09-17 MED ORDER — ACETAMINOPHEN 650 MG RE SUPP: 650.0000 mg | Freq: Four times a day (QID) | RECTAL | Status: DC | PRN

## 2019-09-17 MED ORDER — SODIUM CHLORIDE 0.9% FLUSH
3.0000 mL | Freq: Two times a day (BID) | INTRAVENOUS | Status: DC
  Administered 2019-09-18 – 2019-09-25 (×13): 3 mL via INTRAVENOUS

## 2019-09-17 MED ORDER — FUROSEMIDE 10 MG/ML IJ SOLN
80.0000 mg | Freq: Two times a day (BID) | INTRAMUSCULAR | Status: DC
  Administered 2019-09-17 (×2): 80 mg via INTRAVENOUS
  Filled 2019-09-17 (×2): qty 8

## 2019-09-17 MED ORDER — INSULIN ASPART 100 UNIT/ML ~~LOC~~ SOLN
0.0000 [IU] | Freq: Three times a day (TID) | SUBCUTANEOUS | Status: DC
  Administered 2019-09-17: 2 [IU] via SUBCUTANEOUS

## 2019-09-17 MED ORDER — ONDANSETRON HCL 4 MG/2ML IJ SOLN
4.0000 mg | Freq: Four times a day (QID) | INTRAMUSCULAR | Status: DC | PRN
  Administered 2019-09-19: 4 mg via INTRAVENOUS
  Filled 2019-09-17 (×4): qty 2

## 2019-09-17 MED ORDER — INSULIN ASPART 100 UNIT/ML ~~LOC~~ SOLN
0.0000 [IU] | Freq: Every day | SUBCUTANEOUS | Status: DC
  Administered 2019-09-20 – 2019-09-24 (×2): 2 [IU] via SUBCUTANEOUS

## 2019-09-17 MED ORDER — HEPARIN SODIUM (PORCINE) 5000 UNIT/ML IJ SOLN
5000.0000 [IU] | Freq: Three times a day (TID) | INTRAMUSCULAR | Status: DC
  Administered 2019-09-17 – 2019-09-18 (×3): 5000 [IU] via SUBCUTANEOUS
  Filled 2019-09-17 (×3): qty 1

## 2019-09-17 MED ORDER — ONDANSETRON HCL 4 MG/2ML IJ SOLN
4.0000 mg | Freq: Once | INTRAMUSCULAR | Status: AC
  Administered 2019-09-17: 4 mg via INTRAVENOUS
  Filled 2019-09-17: qty 2

## 2019-09-17 MED ORDER — INSULIN ASPART 100 UNIT/ML ~~LOC~~ SOLN
6.0000 [IU] | Freq: Three times a day (TID) | SUBCUTANEOUS | Status: DC
  Administered 2019-09-18 – 2019-09-26 (×18): 6 [IU] via SUBCUTANEOUS

## 2019-09-17 MED ORDER — SODIUM CHLORIDE 0.9% FLUSH: 3.0000 mL | INTRAVENOUS | Status: DC | PRN

## 2019-09-17 MED ORDER — LORAZEPAM 1 MG PO TABS
0.5000 mg | ORAL_TABLET | Freq: Once | ORAL | Status: AC | PRN
  Administered 2019-09-17: 0.5 mg via ORAL
  Filled 2019-09-17: qty 1

## 2019-09-17 NOTE — ED Notes (Signed)
Dr. Leonides Schanz aware of pt's troponin of 39 and BNP of 1,100.9

## 2019-09-17 NOTE — ED Notes (Signed)
Admitting MD notified of pt's BP. Ok as long as MAP is greater than 65

## 2019-09-17 NOTE — Progress Notes (Signed)
Inpatient Diabetes Program Recommendations  AACE/ADA: New Consensus Statement on Inpatient Glycemic Control (2015)  Target Ranges:  Prepandial:   less than 140 mg/dL      Peak postprandial:   less than 180 mg/dL (1-2 hours)      Critically ill patients:  140 - 180 mg/dL   Lab Results  Component Value Date   GLUCAP 222 (H) 09/17/2019   HGBA1C 7.2 (H) 09/17/2019    Review of Glycemic Control  Diabetes history: DM2 Outpatient Diabetes medications: Insulin pump - 107 units basal Current orders for Inpatient glycemic control: Novolog 0-6 units tidwc  HgbA1C - 7.2%. Pt's PCP is Dr. Shelia Media, who manages her pump. She goes to the diabetes pharmacist at his office every 3 months and her pump settings are updated, according to results of blood sugars. Uses Novolin R insulin (less expensive than others). Blood sugars are very well managed at home.  Inpatient Diabetes Program Recommendations:     Lantus 30-35 units QD Novolog 0-9 units tidwc and hs Novolog 6 units tidwc for meal coverage insulin if pt eats > 50% meal  Would not restart insulin pump until she follows up with Dr Pennie Banter office.  Continue to follow closely.  Thank you. Lorenda Peck, RD, LDN, CDE Inpatient Diabetes Coordinator [phone]

## 2019-09-17 NOTE — ED Notes (Signed)
Repeat troponin drawn, labeled with 2 pt identifiers, and sent to lab

## 2019-09-17 NOTE — Progress Notes (Signed)
Patient seen and examined admitted earlier this morning by Dr. Myna Hidalgo, please see his H&P for details briefly Sara Walker is a 69 year old female with history of remote breast cancer treated with Adriamycin, complicated by severe nonischemic cardiomyopathy, insulin-dependent type 2 diabetes mellitus, history of VT status post AICD was sent to the emergency room per her cardiologist instructions due to rapid A. Fib. -Patient reports mild dyspnea on exertion for about a week, she reports receiving her COVID-19 Moderna vaccination on 3/1, subsequently had mild arthralgias, palpitation, hypoglycemia etc. -In the ED she was noted to be in rapid A. fib with heart rate in the 120s, mildly tachypneic, chest x-ray was concerning for fluid overload.  Subsequently started on a Cardizem drip, given IV Lasix.  Atrial fibrillation with RVR -Continue carvedilol, 25 mg twice daily -Currently on diltiazem drip at 5 mg an hour -Will request cardiology consult -Not on anticoagulation due to ongoing issues with intermittent uterine bleeding  Acute on chronic systolic and diastolic CHF -Nonischemic cardiomyopathy likely secondary to chemotherapy -Suspect volume overload is triggered by rapid A. Fib -Patient reports that her weight has been unchanged from recent baseline -Given IV Lasix yesterday -Continue IV Lasix today, carvedilol   Morbid obesity  Type 2 diabetes mellitus -Hemoglobin A1c 7.5 -Hypoglycemic yesterday, insulin pump taken off -Continue sliding scale insulin today, will need long-acting insulin started pretty soon -Will request diabetes coordinator input  AKI -Creatinine 1.8 on admission, baseline of 0.9 -Likely cardiorenal, monitor with diuresis  Domenic Polite, MD

## 2019-09-17 NOTE — Progress Notes (Signed)
  Echocardiogram 2D Echocardiogram has been performed.  Sara Walker 09/17/2019, 9:00 AM

## 2019-09-17 NOTE — ED Notes (Signed)
SDU  bfast ordered  

## 2019-09-17 NOTE — ED Notes (Signed)
Phlebotomy at bedside.

## 2019-09-17 NOTE — ED Notes (Signed)
Admitting MD at bedside.

## 2019-09-17 NOTE — ED Notes (Signed)
Lunch Tray Ordered @ 1038.  

## 2019-09-17 NOTE — Telephone Encounter (Signed)
The patient is currently in the ED.  

## 2019-09-17 NOTE — ED Notes (Signed)
Care endorsed to [place], [place]

## 2019-09-17 NOTE — ED Notes (Signed)
Meds given per MAR. Name/DOB verified with pt 

## 2019-09-17 NOTE — H&P (Signed)
History and Physical    BILLY ROCCO GBT:[id number] DOB: 1951-02-27 DOA: 09/16/2019  PCP: Deland Pretty, MD   Patient coming from: Home   Chief Complaint: SOB, chest discomfort  HPI: Sara Walker is a 69 y.o. female with medical history significant for remote breast cancer treated with Adriamycin and radiation, chronic combined CHF with EF 20 to 25%, insulin-dependent diabetes mellitus, and history of polymorphic VT status post ICD, now presenting to the emergency department with shortness of breath, chest discomfort, and atrial fibrillation with rapid rate.  Patient reports that she received the first dose of her Covid vaccination on 09/09/2019 and shortly after developed nausea, general malaise, shortness of breath, orthopnea, palpitations, and chest discomfort.  She received a call from her cardiology clinic reporting that she had prolonged episodes of rapid A. fib detected.  She was encouraged to go to the nearest ED.  She has not noticed much leg swelling but has been sleeping in a recliner the last few days.  She denies any fevers or chills.  Dyspnea is worse with exertion.  No calf tenderness or hemoptysis.    ED Course: Upon arrival to the ED, patient is found to be afebrile, saturating mid 90s on room air, tachypneic in the upper 20s, tachycardic in the 710G, and with systolic blood pressure 269.  EKG features atrial fibrillation with rate 125, and nonspecific IVCD with LAD.  Chest x-ray is concerning for CHF.  Chemistry panel notable for sodium of 128 and creatinine 1.82, up from 0.9 in September 2020.  Serum glucose was 69.  CBC with mild leukocytosis.  High-sensitivity troponin was 39, and then 38 2 hours later.  BNP elevated to 1100.  Patient was started on diltiazem infusion, given a dose of IV Lasix, and screened for COVID-19 in the ED.  ED physician reviewed the case and EKG with cardiology who agreed with medical admission for ongoing rate control and diuresis.  Review of Systems:    All other systems reviewed and apart from HPI, are negative.  Past Medical History:  Diagnosis Date  . Angina   . Anxiety   . Arthritis   . Automatic implantable cardioverter-defibrillator in situ   . Breast cancer ([place])   . Cardiomyopathy secondary to chemotherapy [facility])    a.  doxurubicin;  b. s/p SJM [employer]ra [employer]ura CRT-D, model [id number] Q, Ser# [id number]  . CHF (congestive heart failure) ([place])   . Chronic kidney disease    Stage 3  . Chronic systolic heart failure ([place])   . Coronary artery disease   . Depression   . DM2 (diabetes mellitus, type 2) ([place])   . Goiter   . Gout   . Hyperlipidemia   . Hypertension   . Invasive ductal carcinoma of breast ([place])   . LBBB (left bundle branch block)    evidnece of RBBB fall 2012  . 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 PAF (paroxysmal atrial fibrillation) ([place])   . PONV (postoperative nausea and vomiting)    nausea post ICD placement relieved with Zofran  . Shortness of breath   . Syncope     Past Surgical History:  Procedure Laterality Date  . BI-VENTRICULAR IMPLANTABLE CARDIOVERTER DEFIBRILLATOR N/A 12/12/2011   Procedure: BI-VENTRICULAR IMPLANTABLE CARDIOVERTER DEFIBRILLATOR  (CRT-D);  Surgeon: Deboraha Sprang, MD;  Location: [facility] CATH LAB;  Service: Cardiovascular;  Laterality: N/A;  . BIV ICD GENERATOR CHANGEOUT N/A 02/23/2018   Procedure: BIV ICD GENERATOR CHANGEOUT;  Surgeon: Sanda Klein, MD;  Location:  [place] INVASIVE CV LAB;  Service: Cardiovascular;  Laterality: N/A;  . BREAST SURGERY     Left mastectomy  . CARDIAC DEFIBRILLATOR PLACEMENT     [employer]  . [place]  . DILATION AND CURETTAGE OF UTERUS  1975  . FOOT SURGERY  1994  . HYSTEROSCOPY WITH D & C N/A 04/04/2019   Procedure: DILATATION AND CURETTAGE /HYSTEROSCOPY;  Surgeon: Everlene Farrier, MD;  Location: [place];  Service: Gynecology;  Laterality: N/A;  . INTRAUTERINE DEVICE (IUD) INSERTION N/A 04/04/2019   Procedure:  INTRAUTERINE DEVICE (IUD) INSERTION;  Surgeon: Everlene Farrier, MD;  Location: [place];  Service: Gynecology;  Laterality: N/A;  Dr. Gaetano Net to bring IUD from office  . MASTECTOMY     right  . NM MYOCAR PERF WALL MOTION  12/01/2011   no ischemia; EF 22%  . [place]  . TUNNELED VENOUS CATHETER PLACEMENT     removed     reports that she has quit smoking. She has never used smokeless tobacco. She reports that she does not drink alcohol or use drugs.  Allergies  Allergen Reactions  . Lasix [Furosemide] Other (See Comments)    Causes gout  . Ace Inhibitors Cough  . Codeine Other (See Comments)    Crazy  . Digitalis Other (See Comments)    "saw yellow circles"  . Levaquin [Levofloxacin In D5w] Other (See Comments)    Gout   . Other Other (See Comments)    Nitro spray: reaction unknown  . Statins Other (See Comments)    Reaction unknown  . Xarelto [Rivaroxaban]   . Zyrtec [Cetirizine] Other (See Comments)    Slept for a day then was up for 4 days afterwards    Family History  Problem Relation Age of Onset  . Heart attack Father   . Hypertension Brother   . Diabetes Brother      Prior to Admission medications   Medication Sig Start Date End Date Taking? Authorizing Provider  allopurinol (ZYLOPRIM) 300 MG tablet Take 450 mg by mouth daily.    Yes [provider]  carvedilol (COREG) 25 MG tablet Take 1 tablet (25 mg total) by mouth 2 (two) times daily. 10/10/18  Yes Croitoru, Mihai, MD  Cholecalciferol (VITAMIN D) 2000 units CAPS Take 4,000 Units by mouth daily.    Yes [provider]  colchicine 0.6 MG tablet Take 0.6 mg by mouth daily as needed (for gout).    Yes [provider]  Insulin Human (INSULIN PUMP) SOLN Inject into the skin continuous. Novolin R   Yes [provider]  Lutein 20 MG CAPS Take 20 mg by mouth daily.    Yes [provider]  magnesium oxide (MAG-OX) 400 (241.3 Mg) MG tablet TAKE 2 TABLETS BY MOUTH  ONCE DAILY ON EVEN DAYS AND TAKE 1 TABLET ONCE DAILY ON ODD DAYS. Patient taking differently: Take 400-800 mg by mouth See admin instructions. Take 2 tablets on even days and take 1 tablet on odd days 05/16/19  Yes Croitoru, Mihai, MD  metolazone (ZAROXOLYN) 5 MG tablet TAKE 1 TABLET BY MOUTH ONCE DAILY AS NEEDED (TAKE IF WEIGHT IS OVER 255 POUNDS) Patient taking differently: Take 5 mg by mouth daily as needed (Take if weight over 255 pounds).  05/13/19  Yes Croitoru, Mihai, MD  potassium chloride SA (K-DUR) 20 MEQ tablet Take 2 tablets (40 mEq total) by mouth daily. 01/07/19  Yes Croitoru, Mihai, MD  spironolactone (ALDACTONE) 50 MG  tablet Take 1 tablet by mouth once daily Patient taking differently: Take 50 mg by mouth daily.  06/17/19  Yes Croitoru, Mihai, MD  torsemide (DEMADEX) 100 MG tablet Take 1 tablet by mouth once daily Patient taking differently: Take 50 mg by mouth 2 (two) times daily. At noon and 2 pm 07/17/19  Yes Croitoru, Mihai, MD    Physical Exam: Vitals:   09/17/19 0215 09/17/19 0232 09/17/19 0236 09/17/19 0315  BP: 107/75 (!) 88/73 (!) 87/63 98/65  Pulse: 83 (!) 127 82 85  Resp: 11 19 18  (!) 22  Temp:      TempSrc:      SpO2: 95% 96% 96% 93%    Constitutional: NAD, calm  Eyes: PERTLA, lids and conjunctivae normal ENMT: Mucous membranes are moist. Posterior pharynx clear of any exudate or lesions.   Neck: normal, supple, no masses, no thyromegaly Respiratory: Mild tachypnea, no wheezing. No accessory muscle use.  Cardiovascular: Rate ~100 and irregularly irregular. Trace LE edema bilaterally. Abdomen: No distension, no tenderness, soft. Bowel sounds active.  Musculoskeletal: no clubbing / cyanosis. No joint deformity upper and lower extremities.   Skin: no significant rashes, lesions, ulcers. Warm, dry, well-perfused. Neurologic: no facial asymmetry. Sensation intact. Moving all extremities.  Psychiatric: Alert and oriented, appropriate throughout interview and exam. Very  pleasant and cooperative.    Labs and Imaging on Admission: I have personally reviewed following labs and imaging studies  CBC: Recent Labs  Lab 09/16/19 2349  WBC 11.8*  HGB 13.9  HCT 43.9  MCV 94.0  PLT 939   Basic Metabolic Panel: Recent Labs  Lab 09/16/19 2349  NA 128*  K 4.0  CL 90*  CO2 23  GLUCOSE 69*  BUN 48*  CREATININE 1.82*  CALCIUM 9.7   GFR: CrCl cannot be calculated (Unknown ideal weight.). Liver Function Tests: No results for input(s): AST, ALT, ALKPHOS, BILITOT, PROT, ALBUMIN in the last 168 hours. No results for input(s): LIPASE, AMYLASE in the last 168 hours. No results for input(s): AMMONIA in the last 168 hours. Coagulation Profile: No results for input(s): INR, PROTIME in the last 168 hours. Cardiac Enzymes: No results for input(s): CKTOTAL, CKMB, CKMBINDEX, TROPONINI in the last 168 hours. BNP (last 3 results) No results for input(s): PROBNP in the last 8760 hours. HbA1C: No results for input(s): HGBA1C in the last 72 hours. CBG: Recent Labs  Lab 09/16/19 2354 09/17/19 0112 09/17/19 0224 09/17/19 0302  GLUCAP 69* 107* 118* 105*   Lipid Profile: No results for input(s): CHOL, HDL, LDLCALC, TRIG, CHOLHDL, LDLDIRECT in the last 72 hours. Thyroid Function Tests: No results for input(s): TSH, T4TOTAL, FREET4, T3FREE, THYROIDAB in the last 72 hours. Anemia Panel: No results for input(s): VITAMINB12, FOLATE, FERRITIN, TIBC, IRON, RETICCTPCT in the last 72 hours. Urine analysis:    Component Value Date/Time   COLORURINE YELLOW 08/28/2013 1453   APPEARANCEUR HAZY (A) 08/28/2013 1453   LABSPEC 1.014 08/28/2013 1453   PHURINE 5.5 08/28/2013 1453   GLUCOSEU NEGATIVE 08/28/2013 1453   HGBUR NEGATIVE 08/28/2013 1453   BILIRUBINUR NEGATIVE 08/28/2013 1453   KETONESUR NEGATIVE 08/28/2013 1453   PROTEINUR NEGATIVE 08/28/2013 1453   UROBILINOGEN 0.2 08/28/2013 1453   NITRITE NEGATIVE 08/28/2013 1453   LEUKOCYTESUR NEGATIVE 08/28/2013 1453    Sepsis Labs: @LABRCNTIP (procalcitonin:4,lacticidven:4) )No results found for this or any previous visit (from the past 240 hour(s)).   Radiological Exams on Admission: DG Chest Portable 1 View  Result Date: 09/17/2019 CLINICAL DATA:  Shortness of breath EXAM:  PORTABLE CHEST 1 VIEW COMPARISON:  08/28/2013 FINDINGS: There is a multi lead left-sided pacemaker in place. The heart size is enlarged. There is developing pulmonary edema with small bilateral pleural effusions. There is no pneumothorax. There is significant shift of the trachea to the patient's left which is likely secondary to the patient's known enlarged thyroid gland. IMPRESSION: Findings consistent with congestive heart failure. Electronically Signed   By: Constance Holster M.D.   On: 09/17/2019 00:13    EKG: Independently reviewed. Atrial fibrillation, rate 125, non-specific IVCD with LAD.   Assessment/Plan   1. Atrial fibrillation with RVR  - Presents with chest discomfort and SOB, found to be in AF with RVR, and was started on IV diltiazem infusion in ED  - CHADS-VASc is at least 65 (age, gender, CHF, DM); previously on anticoagulation but patient has been hesitant to resume  - Continue IV diltiazem, titrate as needed for goal rate 65-110    2. Acute on chronic combined CHF  - Presents with SOB and orthopnea, has BNP 1100, and CXR findings consistent with acute CHF  - Likely related to AF RVR  - Continue rate-control, continue diuresis with IV Lasix, follow I/Os and daily wts, update echo   3. Acute kidney injury  - SCr is 1.82 in ED, up from an apparent baseline of 0.9  - She is in acute CHF and being diuresed  - Renally-dose medications, check daily chem panel during diuresis    4. Hyponatremia  - Serum sodium is 128 in ED in setting of hypervolemia  - Monitor closely while diuresing    5. Hypoglycemia; insulin-dependent DM  - Serum glucose was 69 in ED, improved with juice; A1c was 6.8% in September 2020  -  Remove insulin pump for now, check CBGs and use a low-intensity SSI if needed, update A1c     DVT prophylaxis: sq heparin   Code Status: Full  Family Communication: Discussed with patient  Disposition Plan: Likely back home once stable on oral medications  Consults called: ED physician reviewed case and EKG with cardiology  Admission status: Inpatient     Vianne Bulls, MD [facility] Pager: See [email or web address]  If 7AM-7PM, please contact the daytime attending [email or web address]  09/17/2019, 3:39 AM

## 2019-09-17 NOTE — ED Notes (Signed)
PIV initiated, 18G to L hand. IV flushes with 10 cc NS without s/s of infiltration. Positive blood return noted. Labs drawn, labeled with 2 pt identifiers, and sent. Secured with tape and tegaderm

## 2019-09-17 NOTE — ED Notes (Signed)
Pts daughter Jason Coop [phone] would like an update when available

## 2019-09-17 NOTE — ED Notes (Signed)
covid swab collected, labeled with 2 pt identifiers, and brought to lab 

## 2019-09-17 NOTE — Telephone Encounter (Signed)
In AFib with rapid rates and loss of CRT, leading to CHF decompensation. Cardiology will see soon.

## 2019-09-17 NOTE — ED Notes (Deleted)
meds given per [facility]. Name/DOB verified with pt. Insulin drip initiated per [facility] based on endotool instructions and verified with Lovell Sheehan, Therapist, sports.

## 2019-09-17 NOTE — ED Notes (Signed)
Pt requesting something for anxiety. Admitting MD notified

## 2019-09-17 NOTE — ED Notes (Signed)
Echo at bedside

## 2019-09-17 NOTE — ED Notes (Deleted)
Per Dr. Leonides Schanz, ok to start insulin drip

## 2019-09-17 NOTE — ED Notes (Signed)
Dr. Leonides Schanz notified of CBG 69. Juice provided

## 2019-09-17 NOTE — Consult Note (Addendum)
Cardiology Consultation:   Patient ID: Sara Walker MRN: [id number]; DOB: 06-19-1951  Admit date: 09/16/2019 Date of Consult: 09/17/2019  Primary Care Provider: Deland Pretty, MD Primary Cardiologist: Sanda Klein, MD  Primary Electrophysiologist:  None    Patient Profile:   Sara Walker is a 69 y.o. female with a hx of severe nonischemic cardiomyopathy, advanced combined systolic and diastolic heart failure,s/p CRT-D (generator change in August 2019 [place]), history of paroxysmal A. fib and polymorphic VT requiring ICD shocks (the setting of hypokalemia), LBBB, chronic renal insufficiency stage III, hyperlipidemia, remote breast cancer.  Diabetes with insulin pump who is being seen today for the evaluation of atrial fibrillation and heart failure at the request of Dr. Broadus John.  History of Present Illness:   Sara Walker is followed by Dr. Sallyanne Kuster for the above cardiac issues.  He is intolerant to multiple medications including ACE inhibitor, arm, and Entresto.  She is also unable to take statins.  She has declined warfarin in the past cannot perform Eliquis.  Patient was started on Xarelto July 2021.  She said she took it for about 9 days before she noted having painful vaginal bleeding.  Xarelto was therefore discontinued.  Patient underwent D&C and placement of an IUD due to the persistent uterine bleeding which seem to have improved the bleeding.  Patient was last seen 07/02/2019 by Dr. Loletha Grayer.  Weight was down 7 pounds since the beginning of the month.  Euvolemic on exam.  Recent labs showed hemoglobin of 12.9 and A1c 7.6.  Defibrillator test was performed and device function was normal.  Reported that she always had a relatively high LV lead pacing threshold.  She has not had recurrent VT VF therapy since 2016.   The patient received the first dose of COVID vaccine 8 days ago. The first 2 days she felt soar. The third day she started having abdominal pain and felt tired. She  continued to get worse and had nausea and vomiting. She also had sob, worse with exertion. She noted her BG were low, as low as 60 at times since she was unable to eat so she turned off her insulin pump. She has chronic chest pain, but nothing unusual. She received a call from Dr Sallyanne Kuster who reported that the patient had been having long episodes of Afib RVR and some rate controlled Afib. She was recommended to go to the ED but patient refused at that time. Later that night the patient said she almost fainted so she decided to call EMS.  In the ED patient was afebrile, pulse 122, respiratory rate 18, 96% O2.  Systolics around 128.  Labs showed sodium 128, potassium 4.0, glucose 69, creatinine 1.82 (up from baseline 0.9).  BNP 1100. HS troponin 39 > 38.  WBC 11.8, hemoglobin 13.9.  Chest x-ray showed congestive heart failure.  EKG showed A. fib RVR.  Patient was started on diltiazem infusion and given IV Lasix.  Patient was admitted for further work-up.   Heart Pathway Score:     Past Medical History:  Diagnosis Date  . Angina   . Anxiety   . Arthritis   . Automatic implantable cardioverter-defibrillator in situ   . Breast cancer ([place])   . Cardiomyopathy secondary to chemotherapy [facility])    a.  doxurubicin;  b. s/p SJM Quadra [employer]ura CRT-D, model [id number] Q, Ser# [id number]  . CHF (congestive heart failure) ([place])   . Chronic kidney disease    Stage 3  . Chronic systolic  heart failure ([place])   . Coronary artery disease   . Depression   . DM2 (diabetes mellitus, type 2) ([place])   . Goiter   . Gout   . Hyperlipidemia   . Hypertension   . Invasive ductal carcinoma of breast ([place])   . LBBB (left bundle branch block)    evidnece of RBBB fall 2012  . 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 PAF (paroxysmal atrial fibrillation) ([place])   . PONV (postoperative nausea and vomiting)    nausea post ICD placement relieved with Zofran  . Shortness of breath   . Syncope     Past  Surgical History:  Procedure Laterality Date  . BI-VENTRICULAR IMPLANTABLE CARDIOVERTER DEFIBRILLATOR N/A 12/12/2011   Procedure: BI-VENTRICULAR IMPLANTABLE CARDIOVERTER DEFIBRILLATOR  (CRT-D);  Surgeon: Deboraha Sprang, MD;  Location[facility] C[facility] LAB;  Service: Cardiovascular;  Laterality: N/A;  . BIV ICD GENERATOR CHANGEOUT N/A 02/23/2018   Procedure: BIV ICD GENERATOR CHANGEOUT;  Surgeon: Sanda Klein, MD;  Location: [place] CV LAB;  Service: Cardiovascular;  Laterality: N/A;  . BREAST SURGERY     Left mastectomy  . CARDIAC DEFIBRILLATOR PLACEMENT     [employer]  . [place]  . DILATION AND CURETTAGE OF UTERUS  1975  . FOOT SURGERY  1994  . HYSTEROSCOPY WITH D & C N/A 04/04/2019   Procedure: DILATATION AND CURETTAGE /HYSTEROSCOPY;  Surgeon: Everlene Farrier, MD;  Location: [place];  Service: Gynecology;  Laterality: N/A;  . INTRAUTERINE DEVICE (IUD) INSERTION N/A 04/04/2019   Procedure: INTRAUTERINE DEVICE (IUD) INSERTION;  Surgeon: Everlene Farrier, MD;  Location: [place];  Service: Gynecology;  Laterality: N/A;  Dr. Gaetano Net to bring IUD from office  . MASTECTOMY     right  . NM MYOCAR PERF WALL MOTION  12/01/2011   no ischemia; EF 22%  . [place]  . TUNNELED VENOUS CATHETER PLACEMENT     removed     Home Medications:  Prior to Admission medications   Medication Sig Start Date End Date Taking? Authorizing Provider  allopurinol (ZYLOPRIM) 300 MG tablet Take 450 mg by mouth daily.    Yes [provider]  carvedilol (COREG) 25 MG tablet Take 1 tablet (25 mg total) by mouth 2 (two) times daily. 10/10/18  Yes Croitoru, Mihai, MD  Cholecalciferol (VITAMIN D) 2000 units CAPS Take 4,000 Units by mouth daily.    Yes [provider]  colchicine 0.6 MG tablet Take 0.6 mg by mouth daily as needed (for gout).    Yes [provider]  Insulin Human (INSULIN PUMP) SOLN Inject into the skin continuous. Novolin R   Yes [provider]    Lutein 20 MG CAPS Take 20 mg by mouth daily.    Yes [provider]  magnesium oxide (MAG-OX) 400 (241.3 Mg) MG tablet TAKE 2 TABLETS BY MOUTH ONCE DAILY ON EVEN DAYS AND TAKE 1 TABLET ONCE DAILY ON ODD DAYS. Patient taking differently: Take 400-800 mg by mouth See admin instructions. Take 2 tablets on even days and take 1 tablet on odd days 05/16/19  Yes Croitoru, Mihai, MD  metolazone (ZAROXOLYN) 5 MG tablet TAKE 1 TABLET BY MOUTH ONCE DAILY AS NEEDED (TAKE IF WEIGHT IS OVER 255 POUNDS) Patient taking differently: Take 5 mg by mouth daily as needed (Take if weight over 255 pounds).  05/13/19  Yes Croitoru, Mihai, MD  potassium chloride SA (K-DUR) 20 MEQ tablet Take 2 tablets (40 mEq  total) by mouth daily. 01/07/19  Yes Croitoru, Mihai, MD  spironolactone (ALDACTONE) 50 MG tablet Take 1 tablet by mouth once daily Patient taking differently: Take 50 mg by mouth daily.  06/17/19  Yes Croitoru, Mihai, MD  torsemide (DEMADEX) 100 MG tablet Take 1 tablet by mouth once daily Patient taking differently: Take 50 mg by mouth 2 (two) times daily. At noon and 2 pm 07/17/19  Yes Croitoru, Mihai, MD    Inpatient Medications: Scheduled Meds: . allopurinol  450 mg Oral Daily  . carvedilol  25 mg Oral BID  . furosemide  80 mg Intravenous Q12H  . heparin  5,000 Units Subcutaneous Q8H  . insulin aspart  0-6 Units Subcutaneous TID WC  . sodium chloride flush  3 mL Intravenous Q12H  . sodium chloride flush  3 mL Intravenous Q12H   Continuous Infusions: . sodium chloride    . diltiazem (CARDIZEM) infusion 5 mg/hr (09/17/19 0953)   PRN Meds: sodium chloride, acetaminophen **OR** acetaminophen, HYDROcodone-acetaminophen, ondansetron **OR** ondansetron (ZOFRAN) IV, sodium chloride flush  Allergies:    Allergies  Allergen Reactions  . Lasix [Furosemide] Other (See Comments)    Causes gout  . Ace Inhibitors Cough  . Codeine Other (See Comments)    Crazy  . Digitalis Other (See Comments)    "saw  yellow circles"  . Levaquin [Levofloxacin In D5w] Other (See Comments)    Gout   . Other Other (See Comments)    Nitro spray: reaction unknown  . Statins Other (See Comments)    Reaction unknown  . Xarelto [Rivaroxaban]   . Zyrtec [Cetirizine] Other (See Comments)    Slept for a day then was up for 4 days afterwards    Social History:   Social History   Socioeconomic History  . Marital status: Divorced    Spouse name: Not on file  . Number of children: Not on file  . Years of education: Not on file  . Highest education level: Not on file  Occupational History  . Not on file  Tobacco Use  . Smoking status: Former Research scientist (life sciences)  . Smokeless tobacco: Never Used  Substance and Sexual Activity  . Alcohol use: No    Alcohol/week: 0.0 standard drinks  . Drug use: No  . Sexual activity: Not Currently  Other Topics Concern  . Not on file  Social History Narrative  . Not on file   Social Determinants of Health   Financial Resource Strain:   . Difficulty of Paying Living Expenses: Not on file  Food Insecurity:   . Worried About Charity fundraiser in the Last Year: Not on file  . Ran Out of Food in the Last Year: Not on file  Transportation Needs:   . Lack of Transportation (Medical): Not on file  . Lack of Transportation (Non-Medical): Not on file  Physical Activity:   . Days of Exercise per Week: Not on file  . Minutes of Exercise per Session: Not on file  Stress:   . Feeling of Stress : Not on file  Social Connections:   . Frequency of Communication with Friends and Family: Not on file  . Frequency of Social Gatherings with Friends and Family: Not on file  . Attends Religious Services: Not on file  . Active Member of Clubs or Organizations: Not on file  . Attends Archivist Meetings: Not on file  . Marital Status: Not on file  Intimate Partner Violence:   . Fear of Current or Ex-Partner:  Not on file  . Emotionally Abused: Not on file  . Physically Abused: Not  on file  . Sexually Abused: Not on file    Family History:   Family History  Problem Relation Age of Onset  . Heart attack Father   . Hypertension Brother   . Diabetes Brother      ROS:  Please see the history of present illness.  All other ROS reviewed and negative.     Physical Exam/Data:   Vitals:   09/17/19 0930 09/17/19 1000 09/17/19 1100 09/17/19 1204  BP: 108/83 101/65 124/67 (!) 168/119  Pulse: 71 87 72 87  Resp: (!) 21 18 (!) 21 20  Temp:      TempSrc:      SpO2: 93% 96% 94% 98%   No intake or output data in the 24 hours ending 09/17/19 1221 Last 3 Weights 07/02/2019 04/04/2019 04/02/2019  Weight (lbs) 250 lb 9.6 oz 246 lb 14.6 oz 247 lb  Weight (kg) 113.671 kg 112 kg 112.038 kg     There is no height or weight on file to calculate BMI.  General:  Well nourished, well developed, in no acute distress HEENT: normal Lymph: no adenopathy Neck: difficult to assess JVD Endocrine:  No thryomegaly Vascular: No carotid bruits; FA pulses 2+ bilaterally without bruits  Cardiac:  normal S1, S2; RRR; no murmur  Lungs:  Crackles at bases Abd: soft, nontender, no hepatomegaly  Ext: moderate edema Musculoskeletal:  No deformities, BUE and BLE strength normal and equal Skin: warm and dry  Neuro:  CNs 2-12 intact, no focal abnormalities noted Psych:  Normal affect   EKG:  The EKG was personally reviewed and demonstrates:  Afib RVR, 125 bpm, LAD Telemetry:  Telemetry was personally reviewed and demonstrates:  Afib with rates in the 70s, PVCs  Relevant CV Studies:  Echo 09/17/19 1. There is profound septal-lateral dyssynchrony, consistent with loss of  resynchronization pacing. Left ventricular ejection fraction, by  estimation, is 25 to 30%. The left ventricle has severely decreased  function. The left ventricle demonstrates  global hypokinesis. The left ventricular internal cavity size was severely  dilated. Left ventricular diastolic function could not be evaluated. Left   ventricular diastolic function could not be evaluated.  2. Right ventricular systolic function is mildly reduced. The right  ventricular size is mildly enlarged. There is moderately elevated  pulmonary artery systolic pressure.  3. Left atrial size was severely dilated.  4. The mitral valve is grossly normal. Moderate to severe mitral valve  regurgitation.  5. Tricuspid valve regurgitation is mild to moderate.  6. The aortic valve is normal in structure. Aortic valve regurgitation is  not visualized.  7. The inferior vena cava is dilated in size with <50% respiratory  variability, suggesting right atrial pressure of 15 mmHg.   Laboratory Data:  High Sensitivity Troponin:   Recent Labs  Lab 09/16/19 2349 09/17/19 0149  TROPONINIHS 39* 38*     Chemistry Recent Labs  Lab 09/16/19 2349 09/17/19 0359  NA 128* 128*  K 4.0 4.0  CL 90* 87*  CO2 23 22  GLUCOSE 69* 113*  BUN 48* 52*  CREATININE 1.82* 1.88*  CALCIUM 9.7 9.6  GFRNONAA 28* 27*  GFRAA 33* 31*  ANIONGAP 15 19*    No results for input(s): PROT, ALBUMIN, AST, ALT, ALKPHOS, BILITOT in the last 168 hours. Hematology Recent Labs  Lab 09/16/19 2349 09/17/19 0359  WBC 11.8* 11.0*  RBC 4.67 4.35  HGB 13.9 13.1  HCT 43.9 40.8  MCV 94.0 93.8  MCH 29.8 30.1  MCHC 31.7 32.1  RDW 14.7 14.6  PLT 286 258   BNP Recent Labs  Lab 09/16/19 2349  BNP 1,100.9*    DDimer No results for input(s): DDIMER in the last 168 hours.   Radiology/Studies:  DG Chest Portable 1 View  Result Date: 09/17/2019 CLINICAL DATA:  Shortness of breath EXAM: PORTABLE CHEST 1 VIEW COMPARISON:  08/28/2013 FINDINGS: There is a multi lead left-sided pacemaker in place. The heart size is enlarged. There is developing pulmonary edema with small bilateral pleural effusions. There is no pneumothorax. There is significant shift of the trachea to the patient's left which is likely secondary to the patient's known enlarged thyroid gland.  IMPRESSION: Findings consistent with congestive heart failure. Electronically Signed   By: Constance Holster M.D.   On: 09/17/2019 00:13   ECHOCARDIOGRAM COMPLETE  Result Date: 09/17/2019    ECHOCARDIOGRAM REPORT   Patient Name:   Sara Walker Date of Exam: 09/17/2019 Medical Rec #:  [id number]      Height:       67.0 in Accession #:    [id number]     Weight:       250.6 lb Date of Birth:  [birth year]      BSA:          2.226 m Patient Age:    77 years       BP:           116/82 mmHg Patient Gender: F              HR:           76 bpm. Exam Location:  Inpatient Procedure: 2D Echo, Cardiac Doppler, Color Doppler and Intracardiac            Opacification Agent Indications:    I50.40* Unspecified combined systolic (congestive) and diastolic                 (congestive) heart failure  History:        Patient has prior history of Echocardiogram examinations, most                 recent 06/10/2014. Cardiomyopathy and CHF, Abnormal ECG and                 Defibrillator, Arrythmias:LBBB, Signs/Symptoms:Syncope; Risk                 Factors:Hypertension, Dyslipidemia and Diabetes. Torsades.                 Chemotherapy induced cardiomyopathy.  Sonographer:    Roseanna Rainbow RDCS Referring Phys: [id number] TIMOTHY S OPYD  Sonographer Comments: Technically difficult study due to poor echo windows and patient is morbidly obese. Image acquisition challenging due to patient body habitus. IMPRESSIONS  1. There is profound septal-lateral dyssynchrony, consistent with loss of resynchronization pacing. Left ventricular ejection fraction, by estimation, is 25 to 30%. The left ventricle has severely decreased function. The left ventricle demonstrates global hypokinesis. The left ventricular internal cavity size was severely dilated. Left ventricular diastolic function could not be evaluated. Left ventricular diastolic function could not be evaluated.  2. Right ventricular systolic function is mildly reduced. The right ventricular size is  mildly enlarged. There is moderately elevated pulmonary artery systolic pressure.  3. Left atrial size was severely dilated.  4. The mitral valve is grossly normal. Moderate to severe mitral valve regurgitation.  5. Tricuspid valve regurgitation is  mild to moderate.  6. The aortic valve is normal in structure. Aortic valve regurgitation is not visualized.  7. The inferior vena cava is dilated in size with <50% respiratory variability, suggesting right atrial pressure of 15 mmHg. FINDINGS  Left Ventricle: There is profound septal-lateral dyssynchrony, consistent with loss of resynchronization pacing. Left ventricular ejection fraction, by estimation, is 25 to 30%. The left ventricle has severely decreased function. The left ventricle demonstrates global hypokinesis. Definity contrast agent was given IV to delineate the left ventricular endocardial borders. The left ventricular internal cavity size was severely dilated. There is no left ventricular hypertrophy. Abnormal (paradoxical) septal motion, consistent with RV pacemaker. Left ventricular diastolic function could not be evaluated due to atrial fibrillation. Left ventricular diastolic function could not be evaluated. Right Ventricle: The right ventricular size is mildly enlarged. No increase in right ventricular wall thickness. Right ventricular systolic function is mildly reduced. There is moderately elevated pulmonary artery systolic pressure. The tricuspid regurgitant velocity is 3.34 m/s, and with an assumed right atrial pressure of 15 mmHg, the estimated right ventricular systolic pressure is 07.6 mmHg. Left Atrium: Left atrial size was severely dilated. Right Atrium: Right atrial size was normal in size. Pericardium: There is no evidence of pericardial effusion. Mitral Valve: The mitral valve is grossly normal. Moderate to severe mitral valve regurgitation, with centrally-directed jet. Tricuspid Valve: The tricuspid valve is normal in structure. Tricuspid  valve regurgitation is mild to moderate. Aortic Valve: The aortic valve is normal in structure. Aortic valve regurgitation is not visualized. Pulmonic Valve: The pulmonic valve was normal in structure. Pulmonic valve regurgitation is not visualized. Aorta: The aortic root and ascending aorta are structurally normal, with no evidence of dilitation. Venous: The inferior vena cava is dilated in size with less than 50% respiratory variability, suggesting right atrial pressure of 15 mmHg. IAS/Shunts: No atrial level shunt detected by color flow Doppler. Additional Comments: A pacer wire is visualized.  LEFT VENTRICLE PLAX 2D LVIDd:         7.32 cm LVIDs:         6.49 cm LV PW:         1.31 cm LV IVS:        0.73 cm LVOT diam:     1.80 cm LV SV:         55 LV SV Index:   25 LVOT Area:     2.54 cm  RIGHT VENTRICLE         IVC TAPSE (M-mode): 2.2 cm  IVC diam: 3.62 cm LEFT ATRIUM             Index       RIGHT ATRIUM           Index LA diam:        5.60 cm 2.52 cm/m  RA Area:     20.20 cm LA Vol (A2C):   70.0 ml 31.45 ml/m RA Volume:   53.50 ml  24.04 ml/m LA Vol (A4C):   82.4 ml 37.02 ml/m LA Biplane Vol: 78.6 ml 35.32 ml/m  AORTIC VALVE LVOT Vmax:   110.00 cm/s LVOT Vmean:  82.300 cm/s LVOT VTI:    0.216 m  AORTA Ao Root diam: 3.10 cm Ao Asc diam:  3.40 cm MITRAL VALVE                 TRICUSPID VALVE MV Area (PHT): 3.65 cm      TV Peak grad:   27.0 mmHg MV Decel  Time: 208 msec      TV Vmax:        2.60 m/s MR Peak grad:    64.3 mmHg   TR Peak grad:   44.6 mmHg MR Mean grad:    36.0 mmHg   TR Vmax:        334.00 cm/s MR Vmax:         401.00 cm/s MR Vmean:        274.0 cm/s  SHUNTS MR PISA:         4.02 cm    Systemic VTI:  0.22 m MR PISA Eff ROA: 25 mm      Systemic Diam: 1.80 cm MR PISA Radius:  0.80 cm MV E velocity: 118.00 cm/s Mihai Croitoru MD Electronically signed by Sanda Klein MD Signature Date/Time: 09/17/2019/11:09:52 AM    Final     Assessment and Plan:   Acute cute combined systolic and diastolic  heart failure -At home she is taking torsemide 100 mg daily, metolazone 5 mg daily, spironolactone 50 mg, and carvedilol 3 5 mg twice daily. -On admission chest x-ray consistent with CHF and BNP elevated -Started on IV Lasix 80 twice daily -No I's and O's/weights recorded -Creatinine 1.82 > 1.88 - Patient feels breathing is better after lasix. Does have fluid on exam. Would continue diuresis. Might need to add metolazone if response is low. - Daily weights, strict I/Os, monitor creatinine -Has discussed SGLT2 inhibitor patient does not want to add more medications.  Also discussed Jardiance or Farxiga  Cardiomyopathy -Nonischemic>>suspected to be post chemotherapy -LVEF was improved with CRT -Echo today showed EF of 25 to 30% with profound septal-lateral dyssynchrony, consistent with loss of resynchronization, global hypokinesis, mildly reduced RV function, RV size is mildly enlarged, moderately elevated pulmonary artery systolic pressure, left atrium severely dilated, moderate to severe MR, mild to moderate TR - Intolerant to ACE/ARB/Entresto - continue BB  S/P CRT-D - echo read profound  septal-lateral dyssynchrony, consistent with loss of resynchronization - Consult EP  A. Fib -Xarelto stopped with recent vaginal bleeding and patient does not want to take Coumadin anymore. Will not take Eliquis.  -Hemoglobin stable -Recent interrogation showed rate controlled Afib with intermittent RVR -CHA2DS2-VASc equals 5 (age, gender, DM, CHF, HTN). -Presented in A. fib RVR started on IV dilt, currently on 5 mg an hour dosing - Patient is now rate controlled  AKI - 1.8 on admission - Baseline around 0.9 - creatinine slightly worse today  Hypertension -Mostly well-controlled  VT -In 2016 patient had torsades the point is in the setting of major electrolyte abnormality and heart failure exacerbation -No recurrence  Hyperlipidemia -Was prescribed Repatha or Praluent does not appear to be  taking it  Morbid obesity   For questions or updates, please contact [facility] Please consult [email or web address] for contact info under   Signed, Cadence Ninfa Meeker, PA-C  09/17/2019 12:21 PM    The patient was seen, examined and discussed with Cadence Ninfa Meeker, PA-C   and I agree with the above.   69 y.o. 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 Caryl Comes and generator change in August 2019 [place] by Dr. Sallyanne Kuster, history of paroxysmal A. fib and polymorphic VT requiring ICD shocks (the setting of hypokalemia), LBBB, chronic renal insufficiency stage III, hyperlipidemia, remote breast cancer.  Diabetes with insulin pump who is being seen today for the evaluation of atrial fibrillation and  heart failure. Se is intolerant to multiple medications including ACE inhibitor, arm, and Entresto.  She is intolerant to statins and refusing to take anticoagulation secondary to prior history of vaginal bleeding that was [facility] and placement of IUD last year. She was last seen by Dr. Lequita Halt December 2020 when defibrillator test was performed and device function was normal.  Reported that she always had a relatively high LV lead pacing threshold.  She has not had recurrent VT VF therapy since 2016.   Dr. Loletha Grayer was notified on September 13, 2019 that she is in persistent atrial fibrillation, the patient states that she received her first dose of COVID vaccine 8 days ago.  She has noticed palpitations last few days and worsening lower extremity edema, shortness of breath, paroxysmal nocturnal dyspnea and orthopnea, she basically has to sit up at night in order to breathe.    On presentation to ED patient was in atrial fibrillation with rapid ventricular response approximately 120 bpm.  Physical exam reveals morbidly obese lady, unable to evaluate her JVDs, but she has crackles up to mid lungs, she has  irregular rate and rhythm no murmur, she has 1+ lower extremity edema.  She was started on IV Cardizem and her current heart rate is in 70s.  Her labs show sodium 138, potassium 4.0, creatinine 1.8 from baseline 0.9, BNP 1100, troponin in 30s with flat trend, hemoglobin 13.9.  Normal TSH of 3.6.  Her echocardiogram shows very poor LVEF 10 to 15% with severe septum to lateral wall dyssynchrony and paradoxical septal motion, however this was obtained while the patient was in A. fib with RVR and noncapturing, QRS appears widened compared to baseline but again acquired while it was not capturing.  Will repeat once the patient is better rate controlled.  We will start diuretic therapy with Lasix 80 mg IV twice daily see how she responds potentially and metolazone in the morning, will monitor her kidney function closely.  She has a history of poor heart rate control while being in atrial fibrillation, if this is the case again we will have to discuss with her the necessity of anticoagulation for a minimum of 4 weeks and then discontinuation afterwards.  Ena Dawley, MD 09/17/2019

## 2019-09-18 ENCOUNTER — Encounter (HOSPITAL_COMMUNITY): Payer: Self-pay | Admitting: Family Medicine

## 2019-09-18 ENCOUNTER — Other Ambulatory Visit: Payer: Self-pay

## 2019-09-18 DIAGNOSIS — I5023 Acute on chronic systolic (congestive) heart failure: Secondary | ICD-10-CM

## 2019-09-18 LAB — BASIC METABOLIC PANEL
Anion gap: 17 — ABNORMAL HIGH (ref 5–15)
BUN: 67 mg/dL — ABNORMAL HIGH (ref 8–23)
CO2: 22 mmol/L (ref 22–32)
Calcium: 9.2 mg/dL (ref 8.9–10.3)
Chloride: 86 mmol/L — ABNORMAL LOW (ref 98–111)
Creatinine, Ser: 2.11 mg/dL — ABNORMAL HIGH (ref 0.44–1.00)
GFR calc Af Amer: 27 mL/min — ABNORMAL LOW (ref >60)
GFR calc non Af Amer: 23 mL/min — ABNORMAL LOW (ref >60)
Glucose, Bld: 295 mg/dL — ABNORMAL HIGH (ref 70–99)
Potassium: 3.6 mmol/L (ref 3.5–5.1)
Sodium: 125 mmol/L — ABNORMAL LOW (ref 135–145)

## 2019-09-18 LAB — CBC
HCT: 38.9 % (ref 36.0–46.0)
Hemoglobin: 12.7 g/dL (ref 12.0–15.0)
MCH: 29.7 pg (ref 26.0–34.0)
MCHC: 32.6 g/dL (ref 30.0–36.0)
MCV: 91.1 fL (ref 80.0–100.0)
Platelets: 269 10*3/uL (ref 150–400)
RBC: 4.27 MIL/uL (ref 3.87–5.11)
RDW: 14.7 % (ref 11.5–15.5)
WBC: 9.1 10*3/uL (ref 4.0–10.5)
nRBC: 0 % (ref 0.0–0.2)

## 2019-09-18 LAB — URINALYSIS, ROUTINE W REFLEX MICROSCOPIC
Bilirubin Urine: NEGATIVE
Glucose, UA: NEGATIVE mg/dL
Ketones, ur: NEGATIVE mg/dL
Leukocytes,Ua: NEGATIVE
Nitrite: NEGATIVE
Protein, ur: 30 mg/dL — AB
Specific Gravity, Urine: 1.015 (ref 1.005–1.030)
pH: 5 (ref 5.0–8.0)

## 2019-09-18 LAB — GLUCOSE, CAPILLARY
Glucose-Capillary: 263 mg/dL — ABNORMAL HIGH (ref 70–99)
Glucose-Capillary: 246 mg/dL — ABNORMAL HIGH (ref 70–99)
Glucose-Capillary: 218 mg/dL — ABNORMAL HIGH (ref 70–99)
Glucose-Capillary: 175 mg/dL — ABNORMAL HIGH (ref 70–99)

## 2019-09-18 LAB — SODIUM, URINE, RANDOM: Sodium, Ur: <10 mmol/L

## 2019-09-18 LAB — OSMOLALITY, URINE: Osmolality, Ur: 324 mOsm/kg (ref 300–900)

## 2019-09-18 MED ORDER — ALUM & MAG HYDROXIDE-SIMETH 200-200-20 MG/5ML PO SUSP
30.0000 mL | ORAL | Status: DC | PRN
  Administered 2019-09-18 – 2019-09-23 (×7): 30 mL via ORAL
  Filled 2019-09-18 (×7): qty 30

## 2019-09-18 MED ORDER — INSULIN GLARGINE 100 UNIT/ML ~~LOC~~ SOLN
20.0000 [IU] | Freq: Two times a day (BID) | SUBCUTANEOUS | Status: DC
  Administered 2019-09-18 – 2019-09-20 (×3): 20 [IU] via SUBCUTANEOUS
  Filled 2019-09-18 (×6): qty 0.2

## 2019-09-18 MED ORDER — TORSEMIDE 20 MG PO TABS
50.0000 mg | ORAL_TABLET | Freq: Two times a day (BID) | ORAL | Status: DC
  Administered 2019-09-19: 50 mg via ORAL
  Filled 2019-09-18: qty 3

## 2019-09-18 NOTE — Progress Notes (Signed)
Progress Note  Patient Name: Sara Walker Date of Encounter: 09/18/2019  Primary Cardiologist: Sanda Klein, MD   Subjective   The patient denies chest pain or shortness of breath but does not feel well overall, complains of lower extremity edema and inability to sleep.  Inpatient Medications    Scheduled Meds: . allopurinol  450 mg Oral Daily  . carvedilol  25 mg Oral BID  . heparin  5,000 Units Subcutaneous Q8H  . insulin aspart  0-5 Units Subcutaneous QHS  . insulin aspart  0-9 Units Subcutaneous TID WC  . insulin aspart  6 Units Subcutaneous TID WC  . insulin glargine  20 Units Subcutaneous BID  . sodium chloride flush  3 mL Intravenous Q12H  . sodium chloride flush  3 mL Intravenous Q12H   Continuous Infusions: . sodium chloride    . diltiazem (CARDIZEM) infusion 5 mg/hr (09/17/19 2307)   PRN Meds: sodium chloride, acetaminophen **OR** acetaminophen, alum & mag hydroxide-simeth, HYDROcodone-acetaminophen, ondansetron **OR** ondansetron (ZOFRAN) IV, sodium chloride flush, zolpidem   Vital Signs    Vitals:   09/17/19 1601 09/17/19 1800 09/17/19 2037 09/18/19 0407  BP:   109/88 106/78  Pulse:    77  Resp:  17 (!) 21 20  Temp:   98 F (36.7 C) 97.9 F (36.6 C)  TempSrc:   Oral Oral  SpO2:   97% 96%  Weight: 119.3 kg   118.1 kg  Height: 5\' 8"  (1.727 m)       Intake/Output Summary (Last 24 hours) at 09/18/2019 1058 Last data filed at 09/18/2019 0408 Gross per 24 hour  Intake 204.31 ml  Output 1100 ml  Net -895.69 ml   Last 3 Weights 09/18/2019 09/17/2019 07/02/2019  Weight (lbs) 260 lb 6.4 oz 263 lb 1.6 oz 250 lb 9.6 oz  Weight (kg) 118.117 kg 119.341 kg 113.671 kg      Telemetry    Atrial fibrillation, ventricular pacing- Personally Reviewed  ECG    No new tracing- Personally Reviewed  Physical Exam   GEN:  Morbidly obese Neck: No JVD Cardiac: iRRR, no murmurs, rubs, or gallops.  Respiratory:  Minimal crackles bilaterally. GI: Soft,  nontender, morbidly obese and distended  MS:  1+ bilateral edema; No deformity. Neuro:  Nonfocal  Psych: Normal affect   Labs    High Sensitivity Troponin:   Recent Labs  Lab 09/16/19 2349 09/17/19 0149  TROPONINIHS 39* 38*      Chemistry Recent Labs  Lab 09/16/19 2349 09/17/19 0359 09/18/19 0516  NA 128* 128* 125*  K 4.0 4.0 3.6  CL 90* 87* 86*  CO2 23 22 22   GLUCOSE 69* 113* 295*  BUN 48* 52* 67*  CREATININE 1.82* 1.88* 2.11*  CALCIUM 9.7 9.6 9.2  GFRNONAA 28* 27* 23*  GFRAA 33* 31* 27*  ANIONGAP 15 19* 17*     Hematology Recent Labs  Lab 09/16/19 2349 09/17/19 0359 09/18/19 0516  WBC 11.8* 11.0* 9.1  RBC 4.67 4.35 4.27  HGB 13.9 13.1 12.7  HCT 43.9 40.8 38.9  MCV 94.0 93.8 91.1  MCH 29.8 30.1 29.7  MCHC 31.7 32.1 32.6  RDW 14.7 14.6 14.7  PLT 286 258 269    BNP Recent Labs  Lab 09/16/19 2349  BNP 1,100.9*     DDimer No results for input(s): DDIMER in the last 168 hours.   Radiology    DG Chest Portable 1 View  Result Date: 09/17/2019 CLINICAL DATA:  Shortness of breath EXAM: PORTABLE CHEST  1 VIEW COMPARISON:  08/28/2013 FINDINGS: There is a multi lead left-sided pacemaker in place. The heart size is enlarged. There is developing pulmonary edema with small bilateral pleural effusions. There is no pneumothorax. There is significant shift of the trachea to the patient's left which is likely secondary to the patient's known enlarged thyroid gland. IMPRESSION: Findings consistent with congestive heart failure. Electronically Signed   By: Constance Holster M.D.   On: 09/17/2019 00:13   TTE: 09/17/2019   1. There is profound septal-lateral dyssynchrony, consistent with loss of  resynchronization pacing. Left ventricular ejection fraction, by  estimation, is 25 to 30%. The left ventricle has severely decreased  function. The left ventricle demonstrates  global hypokinesis. The left ventricular internal cavity size was severely  dilated. Left  ventricular diastolic function could not be evaluated. Left  ventricular diastolic function could not be evaluated.  2. Right ventricular systolic function is mildly reduced. The right  ventricular size is mildly enlarged. There is moderately elevated  pulmonary artery systolic pressure.  3. Left atrial size was severely dilated.  4. The mitral valve is grossly normal. Moderate to severe mitral valve  regurgitation.  5. Tricuspid valve regurgitation is mild to moderate.  6. The aortic valve is normal in structure. Aortic valve regurgitation is  not visualized.  7. The inferior vena cava is dilated in size with <50% respiratory  variability, suggesting right atrial pressure of 15 mmHg.    Patient Profile     69 y.o. 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 Caryl Comes and generator change in August 2019 [place] by Dr. Sallyanne Kuster, history of paroxysmal A. fib and polymorphic VT requiring ICD shocks (the setting of hypokalemia), LBBB, chronic renal insufficiency stage III, hyperlipidemia, remote breast cancer.  Diabetes with insulin pump who is being seen today for the evaluation of atrial fibrillation and heart failure. Se is intolerant to multiple medications including ACE inhibitor, arm, and Entresto.  She is intolerant to statins and refusing to take anticoagulation secondary to prior history of vaginal bleeding that was addressed by [facility] and placement of IUD last year. She was last seen by Dr. Lequita Halt December 2020 when defibrillator test was performed and device function was normal.  Reported that she always had a relatively high LV lead pacing threshold.  She has not had recurrent VT VF therapy since 2016. Dr. Loletha Grayer was notified on September 13, 2019 that she is in persistent atrial fibrillation, the patient states that she received her first dose of COVID vaccine 8 days ago.  She has noticed  palpitations last few days and worsening lower extremity edema, shortness of breath, paroxysmal nocturnal dyspnea and orthopnea, she basically has to sit up at night in order to breathe.    Assessment & Plan    Acute cute combined systolic and diastolic heart failure Cardiomyopathy S/P CRT-D A. Fib AKI Hypertension VT Hyperlipidemia  Her echocardiogram shows very poor LVEF 10 to 15% with severe septum to lateral wall dyssynchrony and paradoxical septal motion, however this was obtained while the patient was in A. fib with RVR and noncapturing, QRS appears widened compared to baseline but again acquired while it was not capturing.  Will repeat today as her heart rate is now in 70s however she remains in atrial fibrillation but has good ventricular capture.  We will obtain 12-lead EKG.  She has diuresed overnight 900 cc, she is down 1 kg,  however still 5 kg above her baseline in December 2020, we will hold diuretics given the fact that her creatinine continues to rise, from 1.8-2.1, her baseline is 0.9. We will ask nephrology for consult.    For questions or updates, please contact [place] Please consult [email or web address] for contact info under    Signed, Ena Dawley, MD  09/18/2019, 10:58 AM

## 2019-09-18 NOTE — Consult Note (Signed)
[facility]  HISTORY AND PHYSICAL  SANDE PICKERT is an 69 y.o. female.    Chief Complaint: SOB and chest discomfort  HPI: Pt is a 32F with a PMH sig for HTN, HLD, CAD, chronic systolic CHF s/p CRT-D, Afib who is now seen in consultation at the request of Dr. Aileen Fass for evaluation and recommendations surrounding AKI and volume overload.    Pt started experiencing SOB and chest discomfort starting 3/1 after she received the first dose of the [place] vaccine.  She noted increased palpitations and SOB.  She got a call from Dr [employer] office Monday stating that she had multiple episodes of Afib and to head to the nearest ED.  She called EMS and they brought her to [facility].    She was found to be in A fib with RVR and was noted to have pulmonary vascular congestion.  She was given her coreg and IV lasix.  She has been started on dilt gtt for Afib and HR is now in the 70s.    Cr 1.88 on admission (up from baseline of 0.9-1.0 08/2019) and is 2.11 today.  Na on admission 128--> down to 125.  In this setting we are asked to see.  Pt is in NAD upon evaluation today.  She is reporting that palpitations are better but still has some SOB.  Was sleeping in recliner several days PTA.  Still has some leg swelling.  Upset that she was given IV lasix as she is worried it may cause gout flare.  No f/c, n/v.      PMH: Past Medical History:  Diagnosis Date  . Angina   . Anxiety   . Arthritis   . Automatic implantable cardioverter-defibrillator in situ   . Breast cancer ([place])   . Cardiomyopathy secondary to chemotherapy [facility])    a.  doxurubicin;  b. s/p SJM Quadra Assura CRT-D, model [id number] Q, Ser# [id number]  . CHF (congestive heart failure) ([place])   . Chronic kidney disease    Stage 3  . Chronic systolic heart failure ([place])   . Coronary artery disease   . Depression   . DM2 (diabetes mellitus, type 2) ([place])   . Goiter   . Gout   . Hyperlipidemia   . Hypertension   . Invasive  ductal carcinoma of breast ([place])   . LBBB (left bundle branch block)    evidnece of RBBB fall 2012  . 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 PAF (paroxysmal atrial fibrillation) ([place])   . PONV (postoperative nausea and vomiting)    nausea post ICD placement relieved with Zofran  . Shortness of breath   . Syncope    PSH: Past Surgical History:  Procedure Laterality Date  . BI-VENTRICULAR IMPLANTABLE CARDIOVERTER DEFIBRILLATOR N/A 12/12/2011   Procedure: BI-VENTRICULAR IMPLANTABLE CARDIOVERTER DEFIBRILLATOR  (CRT-D);  Surgeon: Deboraha Sprang, MD;  Location: [facility] CATH LAB;  Service: Cardiovascular;  Laterality: N/A;  . BIV ICD GENERATOR CHANGEOUT N/A 02/23/2018   Procedure: BIV ICD GENERATOR CHANGEOUT;  Surgeon: Sanda Klein, MD;  Location: [facility] CV LAB;  Service: Cardiovascular;  Laterality: N/A;  . BREAST SURGERY     Left mastectomy  . CARDIAC DEFIBRILLATOR PLACEMENT     [employer] Quadra  . [place]  . DILATION AND CURETTAGE OF UTERUS  1975  . FOOT SURGERY  1994  . HYSTEROSCOPY WITH D & C N/A 04/04/2019   Procedure: DILATATION AND CURETTAGE /HYSTEROSCOPY;  Surgeon: Gaetano Net,  Jeneen Rinks, MD;  Location: [facility];  Service: Gynecology;  Laterality: N/A;  . INTRAUTERINE DEVICE (IUD) INSERTION N/A 04/04/2019   Procedure: INTRAUTERINE DEVICE (IUD) INSERTION;  Surgeon: Everlene Farrier, MD;  Location: [facility];  Service: Gynecology;  Laterality: N/A;  Dr. Gaetano Net to bring IUD from office  . MASTECTOMY     right  . NM MYOCAR PERF WALL MOTION  12/01/2011   no ischemia; EF 22%  . [place]  . TUNNELED VENOUS CATHETER PLACEMENT     removed    Past Medical History:  Diagnosis Date  . Angina   . Anxiety   . Arthritis   . Automatic implantable cardioverter-defibrillator in situ   . Breast cancer ([place])   . Cardiomyopathy secondary to chemotherapy [facility])    a.  doxurubicin;  b. s/p SJM Quadra Assura CRT-D, model [id number] Q, Ser# [id number]  . CHF  (congestive heart failure) ([place])   . Chronic kidney disease    Stage 3  . Chronic systolic heart failure ([place])   . Coronary artery disease   . Depression   . DM2 (diabetes mellitus, type 2) ([place])   . Goiter   . Gout   . Hyperlipidemia   . Hypertension   . Invasive ductal carcinoma of breast ([place])   . LBBB (left bundle branch block)    evidnece of RBBB fall 2012  . 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 PAF (paroxysmal atrial fibrillation) ([place])   . PONV (postoperative nausea and vomiting)    nausea post ICD placement relieved with Zofran  . Shortness of breath   . Syncope     Medications:   Scheduled: . allopurinol  450 mg Oral Daily  . carvedilol  25 mg Oral BID  . heparin  5,000 Units Subcutaneous Q8H  . insulin aspart  0-5 Units Subcutaneous QHS  . insulin aspart  0-9 Units Subcutaneous TID WC  . insulin aspart  6 Units Subcutaneous TID WC  . insulin glargine  20 Units Subcutaneous BID  . sodium chloride flush  3 mL Intravenous Q12H  . sodium chloride flush  3 mL Intravenous Q12H    Medications Prior to Admission  Medication Sig Dispense Refill  . allopurinol (ZYLOPRIM) 300 MG tablet Take 450 mg by mouth daily.     . carvedilol (COREG) 25 MG tablet Take 1 tablet (25 mg total) by mouth 2 (two) times daily. 180 tablet 3  . Cholecalciferol (VITAMIN D) 2000 units CAPS Take 4,000 Units by mouth daily.     . colchicine 0.6 MG tablet Take 0.6 mg by mouth daily as needed (for gout).     . Insulin Human (INSULIN PUMP) SOLN Inject into the skin continuous. Novolin R    . Lutein 20 MG CAPS Take 20 mg by mouth daily.     . magnesium oxide (MAG-OX) 400 (241.3 Mg) MG tablet TAKE 2 TABLETS BY MOUTH ONCE DAILY ON EVEN DAYS AND TAKE 1 TABLET ONCE DAILY ON ODD DAYS. (Patient taking differently: Take 400-800 mg by mouth See admin instructions. Take 2 tablets on even days and take 1 tablet on odd days) 135 tablet 2  . metolazone (ZAROXOLYN) 5 MG tablet TAKE 1 TABLET  BY MOUTH ONCE DAILY AS NEEDED (TAKE IF WEIGHT IS OVER 255 POUNDS) (Patient taking differently: Take 5 mg by mouth daily as needed (Take if weight over 255 pounds). ) 90 tablet 0  . potassium chloride SA (K-DUR) 20 MEQ tablet Take 2 tablets (40 mEq total)  by mouth daily. 180 tablet 3  . spironolactone (ALDACTONE) 50 MG tablet Take 1 tablet by mouth once daily (Patient taking differently: Take 50 mg by mouth daily. ) 90 tablet 2  . torsemide (DEMADEX) 100 MG tablet Take 1 tablet by mouth once daily (Patient taking differently: Take 50 mg by mouth 2 (two) times daily. At noon and 2 pm) 90 tablet 0    ALLERGIES:   Allergies  Allergen Reactions  . Lasix [Furosemide] Other (See Comments)    Causes gout  . Ace Inhibitors Cough  . Codeine Other (See Comments)    Crazy  . Digitalis Other (See Comments)    "saw yellow circles"  . Levaquin [Levofloxacin In D5w] Other (See Comments)    Gout   . Other Other (See Comments)    Nitro spray: reaction unknown  . Statins Other (See Comments)    Reaction unknown  . Xarelto [Rivaroxaban]   . Zyrtec [Cetirizine] Other (See Comments)    Slept for a day then was up for 4 days afterwards    FAM HX: Family History  Problem Relation Age of Onset  . Heart attack Father   . Hypertension Brother   . Diabetes Brother     Social History:   reports that she has quit smoking. She has never used smokeless tobacco. She reports that she does not drink alcohol or use drugs.  ROS: ROS : all other systems reviewed and are negative as per HPI  Blood pressure 94/67, pulse 70, temperature (!) 97.5 F (36.4 C), temperature source Oral, resp. rate 19, height 5\' 8"  (1.727 m), weight 118.1 kg, SpO2 97 %. PHYSICAL EXAM: Physical Exam  GEN NAD, obese, on BSC HEENT EOMI PERRL  NECK  No JVD upright PULM normal WOB, no O2, diminished bibasilar breath sounds CV RRR no m/r/g ABD soft, obese, nontender EXT 1+ LE edema NEURO AAO x 3 SKIN warm and dry   Results for  orders placed or performed during the hospital encounter of 09/16/19 (from the past 48 hour(s))  CBC     Status: Abnormal   Collection Time: 09/16/19 11:49 PM  Result Value Ref Range   WBC 11.8 (H) 4.0 - 10.5 K/uL   RBC 4.67 3.87 - 5.11 MIL/uL   Hemoglobin 13.9 12.0 - 15.0 g/dL   HCT 43.9 36.0 - 46.0 %   MCV 94.0 80.0 - 100.0 fL   MCH 29.8 26.0 - 34.0 pg   MCHC 31.7 30.0 - 36.0 g/dL   RDW 14.7 11.5 - 15.5 %   Platelets 286 150 - 400 K/uL   nRBC 0.0 0.0 - 0.2 %    Comment: Performed at [facility], [place] [address] [place], [place] [address]  Basic metabolic panel     Status: Abnormal   Collection Time: 09/16/19 11:49 PM  Result Value Ref Range   Sodium 128 (L) 135 - 145 mmol/L   Potassium 4.0 3.5 - 5.1 mmol/L   Chloride 90 (L) 98 - 111 mmol/L   CO2 23 22 - 32 mmol/L   Glucose, Bld 69 (L) 70 - 99 mg/dL    Comment: Glucose reference range applies only to samples taken after fasting for at least 8 hours.   BUN 48 (H) 8 - 23 mg/dL   Creatinine, Ser 1.82 (H) 0.44 - 1.00 mg/dL   Calcium 9.7 8.9 - 10.3 mg/dL   GFR calc non Af Amer 28 (L) >60 mL/min   GFR calc Af Amer 33 (L) >60  mL/min   Anion gap 15 5 - 15    Comment: Performed at [facility] [address] [place], [place] [address]  Brain natriuretic peptide     Status: Abnormal   Collection Time: 09/16/19 11:49 PM  Result Value Ref Range   B Natriuretic Peptide 1,100.9 (H) 0.0 - 100.0 pg/mL    Comment: Performed at [facility] [address], [place], [place] [address]  Troponin I (High Sensitivity)     Status: Abnormal   Collection Time: 09/16/19 11:49 PM  Result Value Ref Range   Troponin I (High Sensitivity) 39 (H) <18 ng/L    Comment: (NOTE) Elevated high sensitivity troponin I (hsTnI) values and significant  changes across serial measurements may suggest ACS but many other  chronic and acute conditions are known to elevate hsTnI results.  Refer to the "Links" section for chest pain algorithms and  additional  guidance. Performed at [facility], [place] [address] [place], [place] [address]   CBG monitoring, ED     Status: Abnormal   Collection Time: 09/16/19 11:54 PM  Result Value Ref Range   Glucose-Capillary 69 (L) 70 - 99 mg/dL    Comment: Glucose reference range applies only to samples taken after fasting for at least 8 hours.  SARS CORONAVIRUS 2 (TAT 6-24 HRS) Nasopharyngeal Nasopharyngeal Swab     Status: None   Collection Time: 09/17/19 12:04 AM   Specimen: Nasopharyngeal Swab  Result Value Ref Range   SARS Coronavirus 2 NEGATIVE NEGATIVE    Comment: (NOTE) SARS-CoV-2 target nucleic acids are NOT DETECTED. The SARS-CoV-2 RNA is generally detectable in upper and lower respiratory specimens during the acute phase of infection. Negative results do not preclude SARS-CoV-2 infection, do not rule out co-infections with other pathogens, and should not be used as the sole basis for treatment or other patient management decisions. Negative results must be combined with clinical observations, patient history, and epidemiological information. The expected result is Negative. Fact Sheet for Patients: SugarRoll.be Fact Sheet for Healthcare Providers: https://www.woods-mathews.com/ This test is not yet approved or cleared by the [place] FDA and  has been authorized for detection and/or diagnosis of SARS-CoV-2 by FDA under an Emergency Use Authorization (EUA). This EUA will remain  in effect (meaning this test can be used) for the duration of the COVID-19 declaration under Section 56 4(b)(1) of the Act, 21 U.S.C. section 360bbb-3(b)(1), unless the authorization is terminated or revoked sooner. Performed at [facility], [place] [address], [place], [place] [address]   CBG monitoring, ED     Status: Abnormal   Collection Time: 09/17/19  1:12 AM  Result Value Ref Range   Glucose-Capillary 107 (H) 70 - 99 mg/dL    Comment:  Glucose reference range applies only to samples taken after fasting for at least 8 hours.  Troponin I (High Sensitivity)     Status: Abnormal   Collection Time: 09/17/19  1:49 AM  Result Value Ref Range   Troponin I (High Sensitivity) 38 (H) <18 ng/L    Comment: (NOTE) Elevated high sensitivity troponin I (hsTnI) values and significant  changes across serial measurements may suggest ACS but many other  chronic and acute conditions are known to elevate hsTnI results.  Refer to the "Links" section for chest pain algorithms and additional  guidance. Performed at [facility], [place] [address], [place], [place] [address]   CBG monitoring, ED     Status: Abnormal  Collection Time: 09/17/19  2:24 AM  Result Value Ref Range   Glucose-Capillary 118 (H) 70 - 99 mg/dL    Comment: Glucose reference range applies only to samples taken after fasting for at least 8 hours.  CBG monitoring, ED     Status: Abnormal   Collection Time: 09/17/19  3:02 AM  Result Value Ref Range   Glucose-Capillary 105 (H) 70 - 99 mg/dL    Comment: Glucose reference range applies only to samples taken after fasting for at least 8 hours.  HIV Antibody (routine testing w rflx)     Status: None   Collection Time: 09/17/19  3:59 AM  Result Value Ref Range   HIV Screen 4th Generation wRfx NON REACTIVE NON REACTIVE    Comment: Performed at [facility], [address], [place], [place] [address]  Hemoglobin A1c     Status: Abnormal   Collection Time: 09/17/19  3:59 AM  Result Value Ref Range   Hgb A1c MFr Bld 7.2 (H) 4.8 - 5.6 %    Comment: (NOTE) Pre diabetes:          5.7%-6.4% Diabetes:              >6.4% Glycemic control for   <7.0% adults with diabetes    Mean Plasma Glucose 159.94 mg/dL    Comment: Performed at [facility] [address] [place], [place] [address]  Basic metabolic panel     Status: Abnormal   Collection Time: 09/17/19  3:59 AM  Result Value Ref Range   Sodium 128 (L) 135 -  145 mmol/L   Potassium 4.0 3.5 - 5.1 mmol/L   Chloride 87 (L) 98 - 111 mmol/L   CO2 22 22 - 32 mmol/L   Glucose, Bld 113 (H) 70 - 99 mg/dL    Comment: Glucose reference range applies only to samples taken after fasting for at least 8 hours.   BUN 52 (H) 8 - 23 mg/dL   Creatinine, Ser 1.88 (H) 0.44 - 1.00 mg/dL   Calcium 9.6 8.9 - 10.3 mg/dL   GFR calc non Af Amer 27 (L) >60 mL/min   GFR calc Af Amer 31 (L) >60 mL/min   Anion gap 19 (H) 5 - 15    Comment: Performed at [facility] [address], [place], [place] [address]  CBC     Status: Abnormal   Collection Time: 09/17/19  3:59 AM  Result Value Ref Range   WBC 11.0 (H) 4.0 - 10.5 K/uL   RBC 4.35 3.87 - 5.11 MIL/uL   Hemoglobin 13.1 12.0 - 15.0 g/dL   HCT 40.8 36.0 - 46.0 %   MCV 93.8 80.0 - 100.0 fL   MCH 30.1 26.0 - 34.0 pg   MCHC 32.1 30.0 - 36.0 g/dL   RDW 14.6 11.5 - 15.5 %   Platelets 258 150 - 400 K/uL   nRBC 0.0 0.0 - 0.2 %    Comment: Performed at [facility], [place] [address], [place], [place] [address]  TSH     Status: None   Collection Time: 09/17/19  3:59 AM  Result Value Ref Range   TSH 3.627 0.350 - 4.500 uIU/mL    Comment: Performed by a 3rd Generation assay with a functional sensitivity of <=0.01 uIU/mL. Performed at [facility], [place] [address], [place], [place] [address]   Magnesium     Status: None   Collection Time: 09/17/19  3:59 AM  Result Value Ref Range   Magnesium 2.3 1.7 - 2.4 mg/dL    Comment: Performed at [facility], [place] [address], [place], [place] [address]  CBG monitoring, ED     Status: Abnormal   Collection Time: 09/17/19  8:05 AM  Result Value Ref Range   Glucose-Capillary 139 (H) 70 - 99 mg/dL    Comment: Glucose reference range applies only to samples taken after fasting for at least 8 hours.  CBG monitoring, ED     Status: Abnormal   Collection Time: 09/17/19 12:01 PM  Result Value Ref Range   Glucose-Capillary 222 (H) 70 - 99 mg/dL    Comment: Glucose  reference range applies only to samples taken after fasting for at least 8 hours.  Glucose, capillary     Status: Abnormal   Collection Time: 09/17/19  4:45 PM  Result Value Ref Range   Glucose-Capillary 229 (H) 70 - 99 mg/dL    Comment: Glucose reference range applies only to samples taken after fasting for at least 8 hours.  Glucose, capillary     Status: Abnormal   Collection Time: 09/17/19  8:44 PM  Result Value Ref Range   Glucose-Capillary 269 (H) 70 - 99 mg/dL    Comment: Glucose reference range applies only to samples taken after fasting for at least 8 hours.  CBC     Status: None   Collection Time: 09/18/19  5:16 AM  Result Value Ref Range   WBC 9.1 4.0 - 10.5 K/uL   RBC 4.27 3.87 - 5.11 MIL/uL   Hemoglobin 12.7 12.0 - 15.0 g/dL   HCT 38.9 36.0 - 46.0 %   MCV 91.1 80.0 - 100.0 fL   MCH 29.7 26.0 - 34.0 pg   MCHC 32.6 30.0 - 36.0 g/dL   RDW 14.7 11.5 - 15.5 %   Platelets 269 150 - 400 K/uL   nRBC 0.0 0.0 - 0.2 %    Comment: Performed at [facility], [place] [address], [place], [place] [address]  Basic metabolic panel     Status: Abnormal   Collection Time: 09/18/19  5:16 AM  Result Value Ref Range   Sodium 125 (L) 135 - 145 mmol/L   Potassium 3.6 3.5 - 5.1 mmol/L   Chloride 86 (L) 98 - 111 mmol/L   CO2 22 22 - 32 mmol/L   Glucose, Bld 295 (H) 70 - 99 mg/dL    Comment: Glucose reference range applies only to samples taken after fasting for at least 8 hours.   BUN 67 (H) 8 - 23 mg/dL   Creatinine, Ser 2.11 (H) 0.44 - 1.00 mg/dL   Calcium 9.2 8.9 - 10.3 mg/dL   GFR calc non Af Amer 23 (L) >60 mL/min   GFR calc Af Amer 27 (L) >60 mL/min   Anion gap 17 (H) 5 - 15    Comment: Performed at [facility] [address] [place], [place] [address]  Glucose, capillary     Status: Abnormal   Collection Time: 09/18/19  7:38 AM  Result Value Ref Range   Glucose-Capillary 263 (H) 70 - 99 mg/dL    Comment: Glucose reference range applies only to samples taken after  fasting for at least 8 hours.   Comment 1 Notify RN    Comment 2 Document in Chart   Glucose, capillary     Status: Abnormal   Collection Time: 09/18/19 11:35 AM  Result Value Ref Range   Glucose-Capillary 246 (  H) 70 - 99 mg/dL    Comment: Glucose reference range applies only to samples taken after fasting for at least 8 hours.   Comment 1 Notify RN    Comment 2 Document in Chart     DG Chest Portable 1 View  Result Date: 09/17/2019 CLINICAL DATA:  Shortness of breath EXAM: PORTABLE CHEST 1 VIEW COMPARISON:  08/28/2013 FINDINGS: There is a multi lead left-sided pacemaker in place. The heart size is enlarged. There is developing pulmonary edema with small bilateral pleural effusions. There is no pneumothorax. There is significant shift of the trachea to the patient's left which is likely secondary to the patient's known enlarged thyroid gland. IMPRESSION: Findings consistent with congestive heart failure. Electronically Signed   By: Constance Holster M.D.   On: 09/17/2019 00:13   ECHOCARDIOGRAM COMPLETE  Result Date: 09/17/2019    ECHOCARDIOGRAM REPORT   Patient Name:   Sara Walker Date of Exam: 09/17/2019 Medical Rec #:  [id number]      Height:       67.0 in Accession #:    [id number]     Weight:       250.6 lb Date of Birth:  1950-09-29      BSA:          2.226 m Patient Age:    40 years       BP:           116/82 mmHg Patient Gender: F              HR:           76 bpm. Exam Location:  Inpatient Procedure: 2D Echo, Cardiac Doppler, Color Doppler and Intracardiac            Opacification Agent Indications:    I50.40* Unspecified combined systolic (congestive) and diastolic                 (congestive) heart failure  History:        Patient has prior history of Echocardiogram examinations, most                 recent 06/10/2014. Cardiomyopathy and CHF, Abnormal ECG and                 Defibrillator, Arrythmias:LBBB, Signs/Symptoms:Syncope; Risk                 Factors:Hypertension, Dyslipidemia and  Diabetes. [place].                 Chemotherapy induced cardiomyopathy.  Sonographer:    Roseanna Rainbow RDCS Referring Phys: [id number] TIMOTHY S OPYD  Sonographer Comments: Technically difficult study due to poor echo windows and patient is morbidly obese. Image acquisition challenging due to patient body habitus. IMPRESSIONS  1. There is profound septal-lateral dyssynchrony, consistent with loss of resynchronization pacing. Left ventricular ejection fraction, by estimation, is 25 to 30%. The left ventricle has severely decreased function. The left ventricle demonstrates global hypokinesis. The left ventricular internal cavity size was severely dilated. Left ventricular diastolic function could not be evaluated. Left ventricular diastolic function could not be evaluated.  2. Right ventricular systolic function is mildly reduced. The right ventricular size is mildly enlarged. There is moderately elevated pulmonary artery systolic pressure.  3. Left atrial size was severely dilated.  4. The mitral valve is grossly normal. Moderate to severe mitral valve regurgitation.  5. Tricuspid valve regurgitation is mild to moderate.  6. The aortic valve is normal in structure.  Aortic valve regurgitation is not visualized.  7. The inferior vena cava is dilated in size with <50% respiratory variability, suggesting right atrial pressure of 15 mmHg. FINDINGS  Left Ventricle: There is profound septal-lateral dyssynchrony, consistent with loss of resynchronization pacing. Left ventricular ejection fraction, by estimation, is 25 to 30%. The left ventricle has severely decreased function. The left ventricle demonstrates global hypokinesis. Definity contrast agent was given IV to delineate the left ventricular endocardial borders. The left ventricular internal cavity size was severely dilated. There is no left ventricular hypertrophy. Abnormal (paradoxical) septal motion, consistent with RV pacemaker. Left ventricular diastolic function could  not be evaluated due to atrial fibrillation. Left ventricular diastolic function could not be evaluated. Right Ventricle: The right ventricular size is mildly enlarged. No increase in right ventricular wall thickness. Right ventricular systolic function is mildly reduced. There is moderately elevated pulmonary artery systolic pressure. The tricuspid regurgitant velocity is 3.34 m/s, and with an assumed right atrial pressure of 15 mmHg, the estimated right ventricular systolic pressure is 96.7 mmHg. Left Atrium: Left atrial size was severely dilated. Right Atrium: Right atrial size was normal in size. Pericardium: There is no evidence of pericardial effusion. Mitral Valve: The mitral valve is grossly normal. Moderate to severe mitral valve regurgitation, with centrally-directed jet. Tricuspid Valve: The tricuspid valve is normal in structure. Tricuspid valve regurgitation is mild to moderate. Aortic Valve: The aortic valve is normal in structure. Aortic valve regurgitation is not visualized. Pulmonic Valve: The pulmonic valve was normal in structure. Pulmonic valve regurgitation is not visualized. Aorta: The aortic root and ascending aorta are structurally normal, with no evidence of dilitation. Venous: The inferior vena cava is dilated in size with less than 50% respiratory variability, suggesting right atrial pressure of 15 mmHg. IAS/Shunts: No atrial level shunt detected by color flow Doppler. Additional Comments: A pacer wire is visualized.  LEFT VENTRICLE PLAX 2D LVIDd:         7.32 cm LVIDs:         6.49 cm LV PW:         1.31 cm LV IVS:        0.73 cm LVOT diam:     1.80 cm LV SV:         55 LV SV Index:   25 LVOT Area:     2.54 cm  RIGHT VENTRICLE         IVC TAPSE (M-mode): 2.2 cm  IVC diam: 3.62 cm LEFT ATRIUM             Index       RIGHT ATRIUM           Index LA diam:        5.60 cm 2.52 cm/m  RA Area:     20.20 cm LA Vol (A2C):   70.0 ml 31.45 ml/m RA Volume:   53.50 ml  24.04 ml/m LA Vol (A4C):    82.4 ml 37.02 ml/m LA Biplane Vol: 78.6 ml 35.32 ml/m  AORTIC VALVE LVOT Vmax:   110.00 cm/s LVOT Vmean:  82.300 cm/s LVOT VTI:    0.216 m  AORTA Ao Root diam: 3.10 cm Ao Asc diam:  3.40 cm MITRAL VALVE                 TRICUSPID VALVE MV Area (PHT): 3.65 cm      TV Peak grad:   27.0 mmHg MV Decel Time: 208 msec      TV Vmax:  2.60 m/s MR Peak grad:    64.3 mmHg   TR Peak grad:   44.6 mmHg MR Mean grad:    36.0 mmHg   TR Vmax:        334.00 cm/s MR Vmax:         401.00 cm/s MR Vmean:        274.0 cm/s  SHUNTS MR PISA:         4.02 cm    Systemic VTI:  0.22 m MR PISA Eff ROA: 25 mm      Systemic Diam: 1.80 cm MR PISA Radius:  0.80 cm MV E velocity: 118.00 cm/s Dani Gobble Croitoru MD Electronically signed by Sanda Klein MD Signature Date/Time: 09/17/2019/11:09:52 AM    Final     Assessment/Plan  1.  AKI: in the setting of prolonged episodes of Afib likely causing poor forward flow and hemodynamic changes.  Check UA.  Cr 1.88-->2.11.  Now HR in the 70s-80s.  Having decent UOP.  Some soft Bps--> would suggest perhaps decreasing dose of coreg while on dilt gtt as well.  Hold aldactone as you are doing.  Will check renal US, will start PO torsemide in AM.  D/w pt that she may need IV diuretics if PO ineffective.    2.  Hyponatremia: Na 128-->125, check urine and serum lytes, suspect hypervolemic hyponatremia, suspect Na 125 appears worse in setting of hyperglycemia.    3.  Afib with RVR: cardiology c/s. On coreg and dilt gtt  4.   Acute on chronic systolic CHF:  EF 25-42% this admit  5.  DM: per primary  6.  Dispo: pending  Madelon Lips 09/18/2019, 2:11 PM

## 2019-09-18 NOTE — Progress Notes (Signed)
[facility] PROGRESS NOTE    Progress Note  [name]  WUJ:[id number] DOB: 1950-11-30 DOA: 09/16/2019 PCP: Deland Pretty, MD     Brief Narrative:   Sara Walker is an 69 y.o. female past medical history of breast cancer treated with Adriamycin and radiation, chronic combined systolic and diastolic heart failure with an EF of 20%, insulin-dependent diabetes mellitus type 2, with a history of polymorphic VT status post AICD presents to the emergency room with shortness of breath chest discomfort and A. fib with RVR.  She received her first dose of SARS-CoV-2 on 09/09/2019 20 after she started developing nausea generalized weakness shortness of breath chest discomfort and palpitation.  In the ED she was found to be afebrile satting greater than 90%, her blood pressure was in the 100s, EKG showed A. fib with RVR with a heart rate of 125, chest x-ray showed pulmonary edema her sodium was 128, creatinine 1.8.  Assessment/Plan:   New onset Atrial fibrillation with RVR (HCC) She was started on IV diltiazem her chads Vascor is greater than 4, was previously on anticoagulation but she was hesitant to take it.  Due to her recent vaginal bleed. Continue titrate diltiazem for heart rate less than 100.  Acute on chronic combined systolic and diastolic heart failure/with a history of nonischemic cardiomyopathy secondary to Adriamycin: With a BNP greater than 1000, chest x-ray with bilateral pulmonary infiltrates, with positive JVD and lower extremity edema. She was started on IV diuresis, 2D echo showed lateral and septal disynchrony and paradoxical septal motion. EF of 25% with global hypokinesia, right ventricle systolic function is mildly reduced with biatrial enlargement.  Suspect this episodes were triggered by her A. fib with RVR. She is currently on Coreg and started on IV Lasix and her creatinine has worsened.  Acute kidney injury: With a baseline creatinine of less than 1, on admission  1.8.  She was started on IV Lasix and her creatinine has continued to trend up, will discuss with cardiology the need to hold IV Lasix and probably resynchronize her.  Mild hyponatremia: Question hypervolemic after starting IV Lasix her sodium has worsened.  Insulin-dependent diabetes mellitus type 2/hyperglycemia: Her insulin pump was discontinued. She was started on long-acting insulin per sliding scale her blood glucose significantly elevated, will increase her long-acting noting that her creatinine is mildly elevated.  VT: In 2016 she was noted to be having torsades in the setting of major electrolyte abnormalities and heart failure exacerbation. Monitoring in telemetry with no events.  Hyperlipidemia: Continue Repatha.  DVT prophylaxis: heparin Family Communication:none Disposition Plan/Barrier to D/C:   Code Status:     Code Status Orders  (From admission, onward)         Start     Ordered   09/17/19 0333  Full code  Continuous     09/17/19 0335        Code Status History    Date Active Date Inactive Code Status Order ID Comments User Context   04/04/2019 0636 04/04/2019 1149 Full Code [id number]  Everlene Farrier, MD Inpatient   02/23/2018 1448 02/23/2018 1904 Full Code [id number]  Sanda Klein, MD Inpatient   08/28/2013 1846 08/30/2013 1413 Full Code [id number]  Geradine Girt, DO Inpatient   Advance Care Planning Activity    Advance Directive Documentation     Most Recent Value  Type of Advance Directive  Healthcare Power of Attorney, Living will  Pre-existing out of facility DNR order (yellow form or pink MOST form)  --  "  MOST" Form in Place?  --        IV Access:    Peripheral IV   Procedures and diagnostic studies:   DG Chest Portable 1 View  Result Date: 09/17/2019 CLINICAL DATA:  Shortness of breath EXAM: PORTABLE CHEST 1 VIEW COMPARISON:  08/28/2013 FINDINGS: There is a multi lead left-sided pacemaker in place. The heart size is enlarged. There is  developing pulmonary edema with small bilateral pleural effusions. There is no pneumothorax. There is significant shift of the trachea to the patient's left which is likely secondary to the patient's known enlarged thyroid gland. IMPRESSION: Findings consistent with congestive heart failure. Electronically Signed   By: Constance Holster M.D.   On: 09/17/2019 00:13   ECHOCARDIOGRAM COMPLETE  Result Date: 09/17/2019    ECHOCARDIOGRAM REPORT   Patient Name:   Sara Walker Date of Exam: 09/17/2019 Medical Rec #:  [id number]      Height:       67.0 in Accession #:    [id number]     Weight:       250.6 lb Date of Birth:  1951/06/26      BSA:          2.226 m Patient Age:    3 years       BP:           116/82 mmHg Patient Gender: F              HR:           76 bpm. Exam Location:  Inpatient Procedure: 2D Echo, Cardiac Doppler, Color Doppler and Intracardiac            Opacification Agent Indications:    I50.40* Unspecified combined systolic (congestive) and diastolic                 (congestive) heart failure  History:        Patient has prior history of Echocardiogram examinations, most                 recent 06/10/2014. Cardiomyopathy and CHF, Abnormal ECG and                 Defibrillator, Arrythmias:LBBB, Signs/Symptoms:Syncope; Risk                 Factors:Hypertension, Dyslipidemia and Diabetes. Torsades.                 Chemotherapy induced cardiomyopathy.  Sonographer:    Roseanna Rainbow RDCS Referring Phys: [id number] TIMOTHY S OPYD  Sonographer Comments: Technically difficult study due to poor echo windows and patient is morbidly obese. Image acquisition challenging due to patient body habitus. IMPRESSIONS  1. There is profound septal-lateral dyssynchrony, consistent with loss of resynchronization pacing. Left ventricular ejection fraction, by estimation, is 25 to 30%. The left ventricle has severely decreased function. The left ventricle demonstrates global hypokinesis. The left ventricular internal cavity size was  severely dilated. Left ventricular diastolic function could not be evaluated. Left ventricular diastolic function could not be evaluated.  2. Right ventricular systolic function is mildly reduced. The right ventricular size is mildly enlarged. There is moderately elevated pulmonary artery systolic pressure.  3. Left atrial size was severely dilated.  4. The mitral valve is grossly normal. Moderate to severe mitral valve regurgitation.  5. Tricuspid valve regurgitation is mild to moderate.  6. The aortic valve is normal in structure. Aortic valve regurgitation is not visualized.  7. The inferior vena cava is  dilated in size with <50% respiratory variability, suggesting right atrial pressure of 15 mmHg. FINDINGS  Left Ventricle: There is profound septal-lateral dyssynchrony, consistent with loss of resynchronization pacing. Left ventricular ejection fraction, by estimation, is 25 to 30%. The left ventricle has severely decreased function. The left ventricle demonstrates global hypokinesis. Definity contrast agent was given IV to delineate the left ventricular endocardial borders. The left ventricular internal cavity size was severely dilated. There is no left ventricular hypertrophy. Abnormal (paradoxical) septal motion, consistent with RV pacemaker. Left ventricular diastolic function could not be evaluated due to atrial fibrillation. Left ventricular diastolic function could not be evaluated. Right Ventricle: The right ventricular size is mildly enlarged. No increase in right ventricular wall thickness. Right ventricular systolic function is mildly reduced. There is moderately elevated pulmonary artery systolic pressure. The tricuspid regurgitant velocity is 3.34 m/s, and with an assumed right atrial pressure of 15 mmHg, the estimated right ventricular systolic pressure is 76.5 mmHg. Left Atrium: Left atrial size was severely dilated. Right Atrium: Right atrial size was normal in size. Pericardium: There is no  evidence of pericardial effusion. Mitral Valve: The mitral valve is grossly normal. Moderate to severe mitral valve regurgitation, with centrally-directed jet. Tricuspid Valve: The tricuspid valve is normal in structure. Tricuspid valve regurgitation is mild to moderate. Aortic Valve: The aortic valve is normal in structure. Aortic valve regurgitation is not visualized. Pulmonic Valve: The pulmonic valve was normal in structure. Pulmonic valve regurgitation is not visualized. Aorta: The aortic root and ascending aorta are structurally normal, with no evidence of dilitation. Venous: The inferior vena cava is dilated in size with less than 50% respiratory variability, suggesting right atrial pressure of 15 mmHg. IAS/Shunts: No atrial level shunt detected by color flow Doppler. Additional Comments: A pacer wire is visualized.  LEFT VENTRICLE PLAX 2D LVIDd:         7.32 cm LVIDs:         6.49 cm LV PW:         1.31 cm LV IVS:        0.73 cm LVOT diam:     1.80 cm LV SV:         55 LV SV Index:   25 LVOT Area:     2.54 cm  RIGHT VENTRICLE         IVC TAPSE (M-mode): 2.2 cm  IVC diam: 3.62 cm LEFT ATRIUM             Index       RIGHT ATRIUM           Index LA diam:        5.60 cm 2.52 cm/m  RA Area:     20.20 cm LA Vol (A2C):   70.0 ml 31.45 ml/m RA Volume:   53.50 ml  24.04 ml/m LA Vol (A4C):   82.4 ml 37.02 ml/m LA Biplane Vol: 78.6 ml 35.32 ml/m  AORTIC VALVE LVOT Vmax:   110.00 cm/s LVOT Vmean:  82.300 cm/s LVOT VTI:    0.216 m  AORTA Ao Root diam: 3.10 cm Ao Asc diam:  3.40 cm MITRAL VALVE                 TRICUSPID VALVE MV Area (PHT): 3.65 cm      TV Peak grad:   27.0 mmHg MV Decel Time: 208 msec      TV Vmax:        2.60 m/s MR Peak grad:  64.3 mmHg   TR Peak grad:   44.6 mmHg MR Mean grad:    36.0 mmHg   TR Vmax:        334.00 cm/s MR Vmax:         401.00 cm/s MR Vmean:        274.0 cm/s  SHUNTS MR PISA:         4.02 cm    Systemic VTI:  0.22 m MR PISA Eff ROA: 25 mm      Systemic Diam: 1.80 cm MR PISA  Radius:  0.80 cm MV E velocity: 118.00 cm/s Dani Gobble Croitoru MD Electronically signed by Sanda Klein MD Signature Date/Time: 09/17/2019/11:09:52 AM    Final      Medical Consultants:    None.  Anti-Infectives:   None  Subjective:    [name] she relates her shortness of breath is better denies any chest pain.  Objective:    Vitals:   09/17/19 1601 09/17/19 1800 09/17/19 2037 09/18/19 0407  BP:   109/88 106/78  Pulse:    77  Resp:  17 (!) 21 20  Temp:   98 F (36.7 C) 97.9 F (36.6 C)  TempSrc:   Oral Oral  SpO2:   97% 96%  Weight: 119.3 kg   118.1 kg  Height: 5\' 8"  (1.727 m)      SpO2: 96 %   Intake/Output Summary (Last 24 hours) at 09/18/2019 0824 Last data filed at 09/18/2019 0408 Gross per 24 hour  Intake 204.31 ml  Output 1100 ml  Net -895.69 ml   Filed Weights   09/17/19 1601 09/18/19 0407  Weight: 119.3 kg 118.1 kg    Exam: General exam: In no acute distress. Respiratory system: Good air movement and clear to auscultation. Cardiovascular system: S1 & S2 heard, RRR. +JVD Gastrointestinal system: Abdomen is nondistended, soft and nontender.  Central nervous system: Alert and oriented. No focal neurological deficits. Extremities: No pedal edema. Skin: No rashes, lesions or ulcers  Data Reviewed:    Labs: Basic Metabolic Panel: Recent Labs  Lab 09/16/19 2349 09/16/19 2349 09/17/19 0359 09/18/19 0516  NA 128*  --  128* 125*  K 4.0   < > 4.0 3.6  CL 90*  --  87* 86*  CO2 23  --  22 22  GLUCOSE 69*  --  113* 295*  BUN 48*  --  52* 67*  CREATININE 1.82*  --  1.88* 2.11*  CALCIUM 9.7  --  9.6 9.2  MG  --   --  2.3  --    < > = values in this interval not displayed.   GFR Estimated Creatinine Clearance: 34.5 mL/min (A) (by C-G formula based on SCr of 2.11 mg/dL (H)). Liver Function Tests: No results for input(s): AST, ALT, ALKPHOS, BILITOT, PROT, ALBUMIN in the last 168 hours. No results for input(s): LIPASE, AMYLASE in the last 168  hours. No results for input(s): AMMONIA in the last 168 hours. Coagulation profile No results for input(s): INR, PROTIME in the last 168 hours. COVID-19 Labs  No results for input(s): DDIMER, FERRITIN, LDH, CRP in the last 72 hours.  Lab Results  Component Value Date   SARSCOV2NAA NEGATIVE 09/17/2019   [place] NEGATIVE 04/02/2019    CBC: Recent Labs  Lab 09/16/19 2349 09/17/19 0359 09/18/19 0516  WBC 11.8* 11.0* 9.1  HGB 13.9 13.1 12.7  HCT 43.9 40.8 38.9  MCV 94.0 93.8 91.1  PLT 286 258 269   Cardiac Enzymes:  No results for input(s): CKTOTAL, CKMB, CKMBINDEX, TROPONINI in the last 168 hours. BNP (last 3 results) No results for input(s): PROBNP in the last 8760 hours. CBG: Recent Labs  Lab 09/17/19 0805 09/17/19 1201 09/17/19 1645 09/17/19 2044 09/18/19 0738  GLUCAP 139* 222* 229* 269* 263*   D-Dimer: No results for input(s): DDIMER in the last 72 hours. Hgb A1c: Recent Labs    09/17/19 0359  HGBA1C 7.2*   Lipid Profile: No results for input(s): CHOL, HDL, LDLCALC, TRIG, CHOLHDL, LDLDIRECT in the last 72 hours. Thyroid function studies: Recent Labs    09/17/19 0359  TSH 3.627   Anemia work up: No results for input(s): VITAMINB12, FOLATE, FERRITIN, TIBC, IRON, RETICCTPCT in the last 72 hours. Sepsis Labs: Recent Labs  Lab 09/16/19 2349 09/17/19 0359 09/18/19 0516  WBC 11.8* 11.0* 9.1   Microbiology Recent Results (from the past 240 hour(s))  SARS CORONAVIRUS 2 (TAT 6-24 HRS) Nasopharyngeal Nasopharyngeal Swab     Status: None   Collection Time: 09/17/19 12:04 AM   Specimen: Nasopharyngeal Swab  Result Value Ref Range Status   SARS Coronavirus 2 NEGATIVE NEGATIVE Final    Comment: (NOTE) SARS-CoV-2 target nucleic acids are NOT DETECTED. The SARS-CoV-2 RNA is generally detectable in upper and lower respiratory specimens during the acute phase of infection. Negative results do not preclude SARS-CoV-2 infection, do not rule  out co-infections with other pathogens, and should not be used as the sole basis for treatment or other patient management decisions. Negative results must be combined with clinical observations, patient history, and epidemiological information. The expected result is Negative. Fact Sheet for Patients: SugarRoll.be Fact Sheet for Healthcare Providers: https://www.woods-mathews.com/ This test is not yet approved or cleared by the [place] FDA and  has been authorized for detection and/or diagnosis of SARS-CoV-2 by FDA under an Emergency Use Authorization (EUA). This EUA will remain  in effect (meaning this test can be used) for the duration of the COVID-19 declaration under Section 56 4(b)(1) of the Act, 21 U.S.C. section 360bbb-3(b)(1), unless the authorization is terminated or revoked sooner. Performed at [facility], [place] [address], [place], [place] [address]      Medications:   . allopurinol  450 mg Oral Daily  . carvedilol  25 mg Oral BID  . furosemide  80 mg Intravenous Q12H  . heparin  5,000 Units Subcutaneous Q8H  . insulin aspart  0-5 Units Subcutaneous QHS  . insulin aspart  0-9 Units Subcutaneous TID WC  . insulin aspart  6 Units Subcutaneous TID WC  . insulin glargine  35 Units Subcutaneous Daily  . sodium chloride flush  3 mL Intravenous Q12H  . sodium chloride flush  3 mL Intravenous Q12H   Continuous Infusions: . sodium chloride    . diltiazem (CARDIZEM) infusion 5 mg/hr (09/17/19 2307)      LOS: 1 day   Charlynne Cousins  [facility]  09/18/2019, 8:24 AM

## 2019-09-18 NOTE — Progress Notes (Signed)
Pt refused osmolality lab draw this afternoon, unable to reach [facility] after hour, RN paged [facility], MD with no response. RN rescheduled lab draw tomorrow AM as pt requested.

## 2019-09-19 ENCOUNTER — Inpatient Hospital Stay (HOSPITAL_COMMUNITY): Payer: Medicare Other

## 2019-09-19 DIAGNOSIS — I340 Nonrheumatic mitral (valve) insufficiency: Secondary | ICD-10-CM

## 2019-09-19 DIAGNOSIS — I361 Nonrheumatic tricuspid (valve) insufficiency: Secondary | ICD-10-CM

## 2019-09-19 LAB — OSMOLALITY: Osmolality: 284 mOsm/kg (ref 275–295)

## 2019-09-19 LAB — PROTIME-INR
INR: 1.4 — ABNORMAL HIGH (ref 0.8–1.2)
Prothrombin Time: 17.4 seconds — ABNORMAL HIGH (ref 11.4–15.2)

## 2019-09-19 LAB — GLUCOSE, CAPILLARY
Glucose-Capillary: 206 mg/dL — ABNORMAL HIGH (ref 70–99)
Glucose-Capillary: 236 mg/dL — ABNORMAL HIGH (ref 70–99)
Glucose-Capillary: 236 mg/dL — ABNORMAL HIGH (ref 70–99)
Glucose-Capillary: 237 mg/dL — ABNORMAL HIGH (ref 70–99)
Glucose-Capillary: 210 mg/dL — ABNORMAL HIGH (ref 70–99)

## 2019-09-19 LAB — ECHOCARDIOGRAM LIMITED
Height: 68 in
Weight: 4214.4 oz

## 2019-09-19 LAB — BASIC METABOLIC PANEL
Anion gap: 14 (ref 5–15)
BUN: 75 mg/dL — ABNORMAL HIGH (ref 8–23)
CO2: 23 mmol/L (ref 22–32)
Calcium: 8.8 mg/dL — ABNORMAL LOW (ref 8.9–10.3)
Chloride: 86 mmol/L — ABNORMAL LOW (ref 98–111)
Creatinine, Ser: 2.35 mg/dL — ABNORMAL HIGH (ref 0.44–1.00)
GFR calc Af Amer: 24 mL/min — ABNORMAL LOW (ref >60)
GFR calc non Af Amer: 21 mL/min — ABNORMAL LOW (ref >60)
Glucose, Bld: 197 mg/dL — ABNORMAL HIGH (ref 70–99)
Potassium: 3.4 mmol/L — ABNORMAL LOW (ref 3.5–5.1)
Sodium: 123 mmol/L — ABNORMAL LOW (ref 135–145)

## 2019-09-19 LAB — MAGNESIUM: Magnesium: 2.7 mg/dL — ABNORMAL HIGH (ref 1.7–2.4)

## 2019-09-19 MED ORDER — APIXABAN 5 MG PO TABS: 5.0000 mg | ORAL_TABLET | Freq: Two times a day (BID) | ORAL | 0 refills | Status: DC

## 2019-09-19 MED ORDER — APIXABAN 5 MG PO TABS
5.0000 mg | ORAL_TABLET | Freq: Two times a day (BID) | ORAL | Status: DC
  Administered 2019-09-19 – 2019-09-26 (×15): 5 mg via ORAL
  Filled 2019-09-19 (×15): qty 1

## 2019-09-19 MED ORDER — PERFLUTREN LIPID MICROSPHERE
1.0000 mL | INTRAVENOUS | Status: AC | PRN
  Administered 2019-09-19: 2 mL via INTRAVENOUS
  Filled 2019-09-19: qty 10

## 2019-09-19 MED ORDER — POLYETHYLENE GLYCOL 3350 17 G PO PACK
17.0000 g | PACK | Freq: Two times a day (BID) | ORAL | Status: AC
  Administered 2019-09-19: 17 g via ORAL
  Filled 2019-09-19 (×2): qty 1

## 2019-09-19 MED ORDER — ALUM & MAG HYDROXIDE-SIMETH 200-200-20 MG/5ML PO SUSP
30.0000 mL | Freq: Once | ORAL | Status: AC
  Administered 2019-09-20: 30 mL via ORAL
  Filled 2019-09-19: qty 30

## 2019-09-19 MED ORDER — SODIUM CHLORIDE 0.9 % IV SOLN: INTRAVENOUS | Status: DC

## 2019-09-19 MED ORDER — CARVEDILOL 12.5 MG PO TABS
12.5000 mg | ORAL_TABLET | Freq: Two times a day (BID) | ORAL | Status: DC
  Administered 2019-09-19 – 2019-09-20 (×2): 12.5 mg via ORAL
  Filled 2019-09-19 (×2): qty 1

## 2019-09-19 MED ORDER — POTASSIUM CHLORIDE CRYS ER 20 MEQ PO TBCR
40.0000 meq | EXTENDED_RELEASE_TABLET | Freq: Every day | ORAL | Status: DC
  Administered 2019-09-21 – 2019-09-23 (×3): 40 meq via ORAL
  Filled 2019-09-19 (×4): qty 2

## 2019-09-19 MED ORDER — POTASSIUM CHLORIDE CRYS ER 20 MEQ PO TBCR
40.0000 meq | EXTENDED_RELEASE_TABLET | Freq: Three times a day (TID) | ORAL | Status: AC
  Administered 2019-09-19 – 2019-09-20 (×3): 40 meq via ORAL
  Filled 2019-09-19 (×3): qty 2

## 2019-09-19 MED ORDER — TORSEMIDE 20 MG PO TABS
50.0000 mg | ORAL_TABLET | Freq: Two times a day (BID) | ORAL | Status: DC
  Administered 2019-09-19 – 2019-09-26 (×13): 50 mg via ORAL
  Filled 2019-09-19 (×14): qty 3

## 2019-09-19 MED FILL — ELIQUIS 5 MG TABLET: 5 | 30 days supply | Qty: 60 | Fill #0

## 2019-09-19 NOTE — Progress Notes (Signed)
Subjective:  At least 700 of UOP -  BP seems OK-  mostly over 100- sodium is trending down along with creatinine trending up Objective Vital signs in last 24 hours: Vitals:   09/18/19 1319 09/18/19 2053 09/19/19 0409 09/19/19 0800  BP: 94/67 103/66 129/65 108/65  Pulse: 70 (!) 58 69 70  Resp: 19 16 (!) 21   Temp: (!) 97.5 F (36.4 C) 97.6 F (36.4 C) 97.9 F (36.6 C) (!) 97.5 F (36.4 C)  TempSrc: Oral Oral Oral Oral  SpO2: 97% 94% 93% 95%  Weight:   119.5 kg   Height:       Weight change: 0.136 kg  Intake/Output Summary (Last 24 hours) at 09/19/2019 0914 Last data filed at 09/19/2019 0700 Gross per 24 hour  Intake 840 ml  Output 700 ml  Net 140 ml    Assessment/ Plan: Pt is a 69 y.o. yo female with HTN, CHF and Afib who was admitted on 09/16/2019 with Afib with RVR and volume overload-  Baseline crt around 1-  Has also developed AKI  Assessment/Plan: 1. AKI-  U/A is bland-  Seeming to be hemodynamic in cause.  Hopefully since we have seen some more consistent hemodynamics will start to see renal function plateau and improve.  The fact that her renal function is normal at baseline is a good thing.  No indication for dialysis 2. HTN/vol-  Volume overloaded with soft BP-- HR is stable on cardizem drip as well as max dose coreg.  In the interest of achieving more kidney perfusion I am going to decrease the coreg 3. Hyponatremia-  Seeming like hypervolemic hyponatremia - urine osm is appropriately low.  Should be treated with loop diuretic.  Likes torsemide, will resume.  She normally takes 100 of torsemide at home daily with occasional metolazone-  And also 50 of aldactone.  Should resume the home dose of torsemide at least.  Given her renal failure this will stunt the response fyi 4.  Afib-  On cardizem dip-  With pacer-  Seems like rate has been OK of late-  Per cards  5,  Hypokalemia-  Give 40 of k dur today-  Probably need daily mag as well     Louis Meckel    Labs: Basic Metabolic Panel: Recent Labs  Lab 09/17/19 0359 09/18/19 0516 09/19/19 0441  NA 128* 125* 123*  K 4.0 3.6 3.4*  CL 87* 86* 86*  CO2 22 22 23   GLUCOSE 113* 295* 197*  BUN 52* 67* 75*  CREATININE 1.88* 2.11* 2.35*  CALCIUM 9.6 9.2 8.8*   Liver Function Tests: No results for input(s): AST, ALT, ALKPHOS, BILITOT, PROT, ALBUMIN in the last 168 hours. No results for input(s): LIPASE, AMYLASE in the last 168 hours. No results for input(s): AMMONIA in the last 168 hours. CBC: Recent Labs  Lab 09/16/19 2349 09/17/19 0359 09/18/19 0516  WBC 11.8* 11.0* 9.1  HGB 13.9 13.1 12.7  HCT 43.9 40.8 38.9  MCV 94.0 93.8 91.1  PLT 286 258 269   Cardiac Enzymes: No results for input(s): CKTOTAL, CKMB, CKMBINDEX, TROPONINI in the last 168 hours. CBG: Recent Labs  Lab 09/18/19 0738 09/18/19 1135 09/18/19 1621 09/18/19 2056 09/19/19 0756  GLUCAP 263* 246* 218* 175* 206*    Iron Studies: No results for input(s): IRON, TIBC, TRANSFERRIN, FERRITIN in the last 72 hours. Studies/Results: No results found. Medications: Infusions: . sodium chloride    . diltiazem (CARDIZEM) infusion 5 mg/hr (09/19/19 0033)  Scheduled Medications: . allopurinol  450 mg Oral Daily  . carvedilol  25 mg Oral BID  . heparin  5,000 Units Subcutaneous Q8H  . insulin aspart  0-5 Units Subcutaneous QHS  . insulin aspart  0-9 Units Subcutaneous TID WC  . insulin aspart  6 Units Subcutaneous TID WC  . insulin glargine  20 Units Subcutaneous BID  . sodium chloride flush  3 mL Intravenous Q12H  . sodium chloride flush  3 mL Intravenous Q12H  . torsemide  50 mg Oral BID    have reviewed scheduled and prn medications.  Physical Exam: General: obese, talkative, sitting up in bedside chair-  Very informed of her health Heart: paced RRR Lungs: mostly clear Abdomen: obese Extremities: pitting edema    09/19/2019,9:14 AM  LOS: 2 days

## 2019-09-19 NOTE — Progress Notes (Signed)
  Echocardiogram 2D Echocardiogram has been performed.  Sara Walker A [name] 09/19/2019, 11:08 AM

## 2019-09-19 NOTE — Progress Notes (Signed)
Progress Note  Patient Name: Sara Walker Date of Encounter: 09/19/2019  Primary Cardiologist: Sanda Klein, MD   Subjective   The patient denies chest pain or shortness of breath but does not feel well overall, complains of lower extremity edema and inability to sleep.  Inpatient Medications    Scheduled Meds: . allopurinol  450 mg Oral Daily  . carvedilol  25 mg Oral BID  . heparin  5,000 Units Subcutaneous Q8H  . insulin aspart  0-5 Units Subcutaneous QHS  . insulin aspart  0-9 Units Subcutaneous TID WC  . insulin aspart  6 Units Subcutaneous TID WC  . insulin glargine  20 Units Subcutaneous BID  . sodium chloride flush  3 mL Intravenous Q12H  . sodium chloride flush  3 mL Intravenous Q12H  . torsemide  50 mg Oral BID   Continuous Infusions: . sodium chloride    . diltiazem (CARDIZEM) infusion 5 mg/hr (09/19/19 0033)   PRN Meds: sodium chloride, acetaminophen **OR** acetaminophen, alum & mag hydroxide-simeth, HYDROcodone-acetaminophen, ondansetron **OR** ondansetron (ZOFRAN) IV, sodium chloride flush, zolpidem   Vital Signs    Vitals:   09/18/19 1319 09/18/19 2053 09/19/19 0409 09/19/19 0800  BP: 94/67 103/66 129/65 108/65  Pulse: 70 (!) 58 69 70  Resp: 19 16 (!) 21   Temp: (!) 97.5 F (36.4 C) 97.6 F (36.4 C) 97.9 F (36.6 C) (!) 97.5 F (36.4 C)  TempSrc: Oral Oral Oral Oral  SpO2: 97% 94% 93% 95%  Weight:   119.5 kg   Height:        Intake/Output Summary (Last 24 hours) at 09/19/2019 0931 Last data filed at 09/19/2019 0700 Gross per 24 hour  Intake 840 ml  Output 700 ml  Net 140 ml   Last 3 Weights 09/19/2019 09/18/2019 09/17/2019  Weight (lbs) 263 lb 6.4 oz 260 lb 6.4 oz 263 lb 1.6 oz  Weight (kg) 119.477 kg 118.117 kg 119.341 kg      Telemetry    Atrial fibrillation, ventricular pacing- Personally Reviewed  ECG    No new tracing- Personally Reviewed  Physical Exam   GEN:  Morbidly obese Neck: No JVD Cardiac: iRRR, no murmurs, rubs,  or gallops.  Respiratory:  Minimal crackles bilaterally. GI: Soft, nontender, morbidly obese and distended  MS:  1+ bilateral edema; No deformity. Neuro:  Nonfocal  Psych: Normal affect   Labs    High Sensitivity Troponin:   Recent Labs  Lab 09/16/19 2349 09/17/19 0149  TROPONINIHS 39* 38*      Chemistry Recent Labs  Lab 09/17/19 0359 09/18/19 0516 09/19/19 0441  NA 128* 125* 123*  K 4.0 3.6 3.4*  CL 87* 86* 86*  CO2 22 22 23   GLUCOSE 113* 295* 197*  BUN 52* 67* 75*  CREATININE 1.88* 2.11* 2.35*  CALCIUM 9.6 9.2 8.8*  GFRNONAA 27* 23* 21*  GFRAA 31* 27* 24*  ANIONGAP 19* 17* 14     Hematology Recent Labs  Lab 09/16/19 2349 09/17/19 0359 09/18/19 0516  WBC 11.8* 11.0* 9.1  RBC 4.67 4.35 4.27  HGB 13.9 13.1 12.7  HCT 43.9 40.8 38.9  MCV 94.0 93.8 91.1  MCH 29.8 30.1 29.7  MCHC 31.7 32.1 32.6  RDW 14.7 14.6 14.7  PLT 286 258 269    BNP Recent Labs  Lab 09/16/19 2349  BNP 1,100.9*     DDimer No results for input(s): DDIMER in the last 168 hours.   Radiology    DG Chest Portable 1 View  Result Date: 09/17/2019 CLINICAL DATA:  Shortness of breath EXAM: PORTABLE CHEST 1 VIEW COMPARISON:  08/28/2013 FINDINGS: There is a multi lead left-sided pacemaker in place. The heart size is enlarged. There is developing pulmonary edema with small bilateral pleural effusions. There is no pneumothorax. There is significant shift of the trachea to the patient's left which is likely secondary to the patient's known enlarged thyroid gland. IMPRESSION: Findings consistent with congestive heart failure. Electronically Signed   By: Constance Holster M.D.   On: 09/17/2019 00:13   TTE: 09/17/2019   1. There is profound septal-lateral dyssynchrony, consistent with loss of  resynchronization pacing. Left ventricular ejection fraction, by  estimation, is 25 to 30%. The left ventricle has severely decreased  function. The left ventricle demonstrates  global hypokinesis. The  left ventricular internal cavity size was severely  dilated. Left ventricular diastolic function could not be evaluated. Left  ventricular diastolic function could not be evaluated.  2. Right ventricular systolic function is mildly reduced. The right  ventricular size is mildly enlarged. There is moderately elevated  pulmonary artery systolic pressure.  3. Left atrial size was severely dilated.  4. The mitral valve is grossly normal. Moderate to severe mitral valve  regurgitation.  5. Tricuspid valve regurgitation is mild to moderate.  6. The aortic valve is normal in structure. Aortic valve regurgitation is  not visualized.  7. The inferior vena cava is dilated in size with <50% respiratory  variability, suggesting right atrial pressure of 15 mmHg.    Patient Profile     69 y.o. 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 Caryl Comes and generator change in August 2019 [place] by Dr. Sallyanne Kuster, history of paroxysmal A. fib and polymorphic VT requiring ICD shocks (the setting of hypokalemia), LBBB, chronic renal insufficiency stage III, hyperlipidemia, remote breast cancer.  Diabetes with insulin pump who is being seen today for the evaluation of atrial fibrillation and heart failure. Se is intolerant to multiple medications including ACE inhibitor, arm, and Entresto.  She is intolerant to statins and refusing to take anticoagulation secondary to prior history of vaginal bleeding that was addressed by [facility] and placement of IUD last year. She was last seen by Dr. Lequita Halt December 2020 when defibrillator test was performed and device function was normal.  Reported that she always had a relatively high LV lead pacing threshold.  She has not had recurrent VT VF therapy since 2016. Dr. Loletha Grayer was notified on September 13, 2019 that she is in persistent atrial fibrillation, the patient states that she  received her first dose of COVID vaccine 8 days ago.  She has noticed palpitations last few days and worsening lower extremity edema, shortness of breath, paroxysmal nocturnal dyspnea and orthopnea, she basically has to sit up at night in order to breathe.    Assessment & Plan    Acute cute combined systolic and diastolic heart failure Cardiomyopathy S/P CRT-D A. Fib AKI Hypertension VT Hyperlipidemia  Her echocardiogram shows very poor LVEF 10 to 15% with severe septum to lateral wall dyssynchrony and paradoxical septal motion, however this was obtained while the patient was in A. fib with RVR and noncapturing, QRS appears widened compared to baseline but again acquired while it was not capturing.  Today's EKG shows atrial fibrillation,  ventricular paced rhythm with excellent capture at 70 bpm and narrow QRS complexes down 134 ms.   We will plan for  TEE with cardioversion for tomorrow as it seems that A. fib has been responsible for her worsening CHF as well as kidney failure. She agrees to 4 weeks of Eliquis, she was previously on Xarelto however experience vaginal bleeding that was treated by [facility] and polypectomy and placement of IUD, however she also had significant gastric reflux and is refusing to take it, Coumadin would not be an ideal drug for her.  We will try Eliquis, will asking case management for assistance with either a free sample or discount card, if these approaches fail we will try to give her free samples from our clinic for 4 weeks.  I have held her torsemide this morning as her creatinine continues to rise.  We appreciate nephrology input, if they want to restart please feel free.  For questions or updates, please contact [place] Please consult [email or web address] for contact info under    Signed, Ena Dawley, MD  09/19/2019, 9:31 AM

## 2019-09-19 NOTE — Discharge Instructions (Signed)

## 2019-09-19 NOTE — Progress Notes (Signed)
[facility] PROGRESS NOTE    Progress Note  Sara Walker  GYI:[id number] DOB: December 15, 1950 DOA: 09/16/2019 PCP: Deland Pretty, MD     Brief Narrative:   Sara Walker is an 69 y.o. female past medical history of breast cancer treated with Adriamycin and radiation, chronic combined systolic and diastolic heart failure with an EF of 20%, insulin-dependent diabetes mellitus type 2, with a history of polymorphic VT status post AICD presents to the emergency room with shortness of breath chest discomfort and A. fib with RVR.  She received her first dose of SARS-CoV-2 on 09/09/2019 20 after she started developing nausea generalized weakness shortness of breath chest discomfort and palpitation.  In the ED she was found to be afebrile satting greater than 90%, her blood pressure was in the 100s, EKG showed A. fib with RVR with a heart rate of 125, chest x-ray showed pulmonary edema her sodium was 128, creatinine 1.8.  Assessment/Plan:   New onset Atrial fibrillation with RVR (HCC) She was started on IV diltiazem her chads VASC2 is greater than 4, was previously on anticoagulation but she was hesitant to take it.  Due to her recent vaginal bleed. Further management per cardiology.  Acute on chronic combined systolic and diastolic heart failure/with a history of nonischemic cardiomyopathy secondary to Adriamycin: With a BNP greater than 1000, chest x-ray with bilateral pulmonary infiltrates, with positive JVD and lower extremity edema. She was started on IV diuresis, 2D echo showed lateral and septal disynchrony and paradoxical septal motion. EF of 25% with global hypokinesia, right ventricle systolic function is mildly reduced with biatrial enlargement.   Cardiology was consulted recommended to repeat her echocardiogram once her heart rate is improved. PT twelve-lead EKG showed a widened QRS. She is is up 1 kg with fair diuresis.  Consulted nephrology for further evaluation.  Acute kidney  injury: With a baseline creatinine of less than 1, on admission 1.8.  She was started on IV Lasix and her creatinine has continued to trend up.  Likely A. fib with septal dyssynchrony causing poor output and hemodynamic changes. Renal recommended to decrease the dose of Coreg, hold Aldactone check a renal ultrasound and start her on oral torsemide in the morning. Also recommended urine lites which the patient has refused.  Hypervolemic hyponatremia: Renal recommended urine lites which is patient has refused. Her sodium this morning is worst.  Insulin-dependent diabetes mellitus type 2/hyperglycemia: Her insulin pump was discontinued. She was started on long-acting insulin per sliding scale her blood glucose significantly elevated, will increase her long-acting noting that her creatinine is mildly elevated.  VT: In 2016 she was noted to be having torsades in the setting of major electrolyte abnormalities and heart failure exacerbation. Monitoring in telemetry with no events.  Hyperlipidemia: Continue Repatha.  Hypokalemia: We will give her single dose orally. Check a magnesium level.  Constipation: She relates her last bowel movement was 2 days ago and it was very hard we will start her on MiraLAX.  DVT prophylaxis: heparin Family Communication:none Disposition Plan/Barrier to D/C: Patient can be discharged once her heart rate and her creatinine has improved.  Code Status:     Code Status Orders  (From admission, onward)         Start     Ordered   09/17/19 0333  Full code  Continuous     09/17/19 0335        Code Status History    Date Active Date Inactive Code Status Order ID  Comments User Context   04/04/2019 0636 04/04/2019 1149 Full Code [id number]  Everlene Farrier, MD Inpatient   02/23/2018 1448 02/23/2018 1904 Full Code [id number]  Sanda Klein, MD Inpatient   08/28/2013 1846 08/30/2013 1413 Full Code [id number]  Geradine Girt, DO Inpatient   Advance Care Planning  Activity    Advance Directive Documentation     Most Recent Value  Type of Advance Directive  Healthcare Power of Attorney, Living will  Pre-existing out of facility DNR order (yellow form or pink MOST form)  --  "MOST" Form in Place?  --        IV Access:    Peripheral IV   Procedures and diagnostic studies:   No results found.   Medical Consultants:    None.  Anti-Infectives:   None  Subjective:    Sara Walker relates her breathing is unchanged from yesterday, she denies any chest pain.  Objective:    Vitals:   09/18/19 1319 09/18/19 2053 09/19/19 0409 09/19/19 0800  BP: 94/67 103/66 129/65 108/65  Pulse: 70 (!) 58 69 70  Resp: 19 16 (!) 21   Temp: (!) 97.5 F (36.4 C) 97.6 F (36.4 C) 97.9 F (36.6 C) (!) 97.5 F (36.4 C)  TempSrc: Oral Oral Oral Oral  SpO2: 97% 94% 93% 95%  Weight:   119.5 kg   Height:       SpO2: 95 %   Intake/Output Summary (Last 24 hours) at 09/19/2019 0919 Last data filed at 09/19/2019 0700 Gross per 24 hour  Intake 840 ml  Output 700 ml  Net 140 ml   Filed Weights   09/17/19 1601 09/18/19 0407 09/19/19 0409  Weight: 119.3 kg 118.1 kg 119.5 kg    Exam: General exam: In no acute distress. Respiratory system: Good air movement and clear to auscultation. Cardiovascular system: S1 & S2 heard, RRR.  Positive JVD Gastrointestinal system: Abdomen is nondistended, soft and nontender.  Extremities: 2+ edema. Skin: No rashes, lesions or ulcers Psychiatry: Judgement and insight appear normal.   Data Reviewed:    Labs: Basic Metabolic Panel: Recent Labs  Lab 09/16/19 2349 09/16/19 2349 09/17/19 0359 09/17/19 0359 09/18/19 0516 09/19/19 0441  NA 128*  --  128*  --  125* 123*  K 4.0   < > 4.0   < > 3.6 3.4*  CL 90*  --  87*  --  86* 86*  CO2 23  --  22  --  22 23  GLUCOSE 69*  --  113*  --  295* 197*  BUN 48*  --  52*  --  67* 75*  CREATININE 1.82*  --  1.88*  --  2.11* 2.35*  CALCIUM 9.7  --  9.6  --  9.2  8.8*  MG  --   --  2.3  --   --   --    < > = values in this interval not displayed.   GFR Estimated Creatinine Clearance: 31.1 mL/min (A) (by C-G formula based on SCr of 2.35 mg/dL (H)). Liver Function Tests: No results for input(s): AST, ALT, ALKPHOS, BILITOT, PROT, ALBUMIN in the last 168 hours. No results for input(s): LIPASE, AMYLASE in the last 168 hours. No results for input(s): AMMONIA in the last 168 hours. Coagulation profile No results for input(s): INR, PROTIME in the last 168 hours. COVID-19 Labs  No results for input(s): DDIMER, FERRITIN, LDH, CRP in the last 72 hours.  Lab Results  Component Value Date  [place] NEGATIVE 09/17/2019   [place] NEGATIVE 04/02/2019    CBC: Recent Labs  Lab 09/16/19 2349 09/17/19 0359 09/18/19 0516  WBC 11.8* 11.0* 9.1  HGB 13.9 13.1 12.7  HCT 43.9 40.8 38.9  MCV 94.0 93.8 91.1  PLT 286 258 269   Cardiac Enzymes: No results for input(s): CKTOTAL, CKMB, CKMBINDEX, TROPONINI in the last 168 hours. BNP (last 3 results) No results for input(s): PROBNP in the last 8760 hours. CBG: Recent Labs  Lab 09/18/19 0738 09/18/19 1135 09/18/19 1621 09/18/19 2056 09/19/19 0756  GLUCAP 263* 246* 218* 175* 206*   D-Dimer: No results for input(s): DDIMER in the last 72 hours. Hgb A1c: Recent Labs    09/17/19 0359  HGBA1C 7.2*   Lipid Profile: No results for input(s): CHOL, HDL, LDLCALC, TRIG, CHOLHDL, LDLDIRECT in the last 72 hours. Thyroid function studies: Recent Labs    09/17/19 0359  TSH 3.627   Anemia work up: No results for input(s): VITAMINB12, FOLATE, FERRITIN, TIBC, IRON, RETICCTPCT in the last 72 hours. Sepsis Labs: Recent Labs  Lab 09/16/19 2349 09/17/19 0359 09/18/19 0516  WBC 11.8* 11.0* 9.1   Microbiology Recent Results (from the past 240 hour(s))  SARS CORONAVIRUS 2 (TAT 6-24 HRS) Nasopharyngeal Nasopharyngeal Swab     Status: None   Collection Time: 09/17/19 12:04 AM   Specimen:  Nasopharyngeal Swab  Result Value Ref Range Status   SARS Coronavirus 2 NEGATIVE NEGATIVE Final    Comment: (NOTE) SARS-CoV-2 target nucleic acids are NOT DETECTED. The SARS-CoV-2 RNA is generally detectable in upper and lower respiratory specimens during the acute phase of infection. Negative results do not preclude SARS-CoV-2 infection, do not rule out co-infections with other pathogens, and should not be used as the sole basis for treatment or other patient management decisions. Negative results must be combined with clinical observations, patient history, and epidemiological information. The expected result is Negative. Fact Sheet for Patients: SugarRoll.be Fact Sheet for Healthcare Providers: https://www.woods-mathews.com/ This test is not yet approved or cleared by the [place] FDA and  has been authorized for detection and/or diagnosis of SARS-CoV-2 by FDA under an Emergency Use Authorization (EUA). This EUA will remain  in effect (meaning this test can be used) for the duration of the COVID-19 declaration under Section 56 4(b)(1) of the Act, 21 U.S.C. section 360bbb-3(b)(1), unless the authorization is terminated or revoked sooner. Performed at [facility], [place] [address] [place], [place] [address]      Medications:   . allopurinol  450 mg Oral Daily  . carvedilol  25 mg Oral BID  . heparin  5,000 Units Subcutaneous Q8H  . insulin aspart  0-5 Units Subcutaneous QHS  . insulin aspart  0-9 Units Subcutaneous TID WC  . insulin aspart  6 Units Subcutaneous TID WC  . insulin glargine  20 Units Subcutaneous BID  . sodium chloride flush  3 mL Intravenous Q12H  . sodium chloride flush  3 mL Intravenous Q12H  . torsemide  50 mg Oral BID   Continuous Infusions: . sodium chloride    . diltiazem (CARDIZEM) infusion 5 mg/hr (09/19/19 0033)      LOS: 2 days   Charlynne Cousins  [facility]  09/19/2019, 9:19  AM

## 2019-09-19 NOTE — Progress Notes (Deleted)
Entered in error

## 2019-09-19 NOTE — Progress Notes (Signed)
Sara Walker for apixaban Indication: atrial fibrillation  Allergies  Allergen Reactions  . Lasix [Furosemide] Other (See Comments)    Causes gout  . Ace Inhibitors Cough  . Codeine Other (See Comments)    Crazy  . Digitalis Other (See Comments)    "saw yellow circles"  . Levaquin [Levofloxacin In D5w] Other (See Comments)    Gout   . Other Other (See Comments)    Nitro spray: reaction unknown  . Statins Other (See Comments)    Reaction unknown  . Xarelto [Rivaroxaban]   . Zyrtec [Cetirizine] Other (See Comments)    Slept for a day then was up for 4 days afterwards    Patient Measurements: Height: 5\' 8"  (172.7 cm) Weight: 263 lb 6.4 oz (119.5 kg) IBW/kg (Calculated) : 63.9   Vital Signs: Temp: 97.5 F (36.4 C) (03/11 0800) Temp Source: Oral (03/11 0800) BP: 108/65 (03/11 0800) Pulse Rate: 70 (03/11 0800)  Labs: Recent Labs    09/16/19 2349 09/16/19 2349 09/17/19 0149 09/17/19 0359 09/18/19 0516 09/19/19 0441  HGB 13.9   < >  --  13.1 12.7  --   HCT 43.9  --   --  40.8 38.9  --   PLT 286  --   --  258 269  --   CREATININE 1.82*   < >  --  1.88* 2.11* 2.35*  TROPONINIHS 39*  --  38*  --   --   --    < > = values in this interval not displayed.    Estimated Creatinine Clearance: 31.1 mL/min (A) (by C-G formula based on SCr of 2.35 mg/dL (H)).   Medical History: Past Medical History:  Diagnosis Date  . Angina   . Anxiety   . Arthritis   . Atrial fibrillation ([place])   . Automatic implantable cardioverter-defibrillator in situ   . Breast cancer ([place])   . Cardiomyopathy secondary to chemotherapy [facility])    a.  doxurubicin;  b. s/p SJM Quadra Assura CRT-D, model [id number] Q, Ser# [id number]  . CHF (congestive heart failure) ([place])   . Chronic kidney disease    Stage 3  . Chronic systolic heart failure ([place])   . Coronary artery disease   . Depression   . DM2 (diabetes mellitus, type 2) ([place])   . Goiter   . Gout   .  Hyperlipidemia   . Hypertension   . Invasive ductal carcinoma of breast ([place])   . LBBB (left bundle branch block)    evidnece of RBBB fall 2012  . 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 PAF (paroxysmal atrial fibrillation) ([place])   . PONV (postoperative nausea and vomiting)    nausea post ICD placement relieved with Zofran  . Shortness of breath   . Syncope     Medications:  Medications Prior to Admission  Medication Sig Dispense Refill Last Dose  . allopurinol (ZYLOPRIM) 300 MG tablet Take 450 mg by mouth daily.    09/16/2019 at Unknown time  . carvedilol (COREG) 25 MG tablet Take 1 tablet (25 mg total) by mouth 2 (two) times daily. 180 tablet 3 09/16/2019 at 1800  . Cholecalciferol (VITAMIN D) 2000 units CAPS Take 4,000 Units by mouth daily.    09/16/2019 at Unknown time  . colchicine 0.6 MG tablet Take 0.6 mg by mouth daily as needed (for gout).    Past Month at Unknown time  . Insulin Human (INSULIN PUMP) SOLN Inject into the skin  continuous. Novolin R   09/17/2019 at Unknown time  . Lutein 20 MG CAPS Take 20 mg by mouth daily.    09/16/2019 at Unknown time  . magnesium oxide (MAG-OX) 400 (241.3 Mg) MG tablet TAKE 2 TABLETS BY MOUTH ONCE DAILY ON EVEN DAYS AND TAKE 1 TABLET ONCE DAILY ON ODD DAYS. (Patient taking differently: Take 400-800 mg by mouth See admin instructions. Take 2 tablets on even days and take 1 tablet on odd days) 135 tablet 2 09/16/2019 at Unknown time  . metolazone (ZAROXOLYN) 5 MG tablet TAKE 1 TABLET BY MOUTH ONCE DAILY AS NEEDED (TAKE IF WEIGHT IS OVER 255 POUNDS) (Patient taking differently: Take 5 mg by mouth daily as needed (Take if weight over 255 pounds). ) 90 tablet 0 09/15/2019 at Unknown time  . potassium chloride SA (K-DUR) 20 MEQ tablet Take 2 tablets (40 mEq total) by mouth daily. 180 tablet 3 09/16/2019 at Unknown time  . spironolactone (ALDACTONE) 50 MG tablet Take 1 tablet by mouth once daily (Patient taking differently: Take 50 mg by mouth  daily. ) 90 tablet 2 09/16/2019 at Unknown time  . torsemide (DEMADEX) 100 MG tablet Take 1 tablet by mouth once daily (Patient taking differently: Take 50 mg by mouth 2 (two) times daily. At noon and 2 pm) 90 tablet 0 09/16/2019 at Unknown time   Scheduled:  . allopurinol  450 mg Oral Daily  . apixaban  5 mg Oral BID  . carvedilol  12.5 mg Oral BID  . insulin aspart  0-5 Units Subcutaneous QHS  . insulin aspart  0-9 Units Subcutaneous TID WC  . insulin aspart  6 Units Subcutaneous TID WC  . insulin glargine  20 Units Subcutaneous BID  . polyethylene glycol  17 g Oral BID  . [START ON 09/20/2019] potassium chloride  40 mEq Oral Daily  . potassium chloride  40 mEq Oral TID  . sodium chloride flush  3 mL Intravenous Q12H  . sodium chloride flush  3 mL Intravenous Q12H  . torsemide  50 mg Oral BID    Assessment: 69 yo female with new onset afib/RVR. Pharmacy consulted to dose apixaban. Plans for TEE/DCCV on 3/12 -Wt= 119kg, SCr= 2.35 (trend up; baseline ~ 1.0 in 2020)  Spoke with Dr. Meda Coffee: anticipate improvement of SCr following DCCV. Plans are to watch SCr trend closely  Goal of Therapy:  Monitor platelets by anticoagulation protocol: Yes   Plan:  -apixaban 5mg  po bid -Watch SCr -Will provide patient education  Hildred Laser, PharmD Clinical Pharmacist **Pharmacist phone directory can now be found on [email or web address] ([email or web address]).  Listed under [place].

## 2019-09-19 NOTE — Care Management (Addendum)
09-19-19 Benefits check submitted for Eliquis. Case Manager will provide the patient with the 30 day free voucher. No further needs from Case Manager at this time. Bethena Roys, RN,BSN    [place] delivered the medications to the patient's room. Co pay after one month free will be $47.00- patient states she will not be able to pay for this cost. Patient is aware of the patient assistance application online and the tun-around time. Bethena Roys, RN,BSN Case Manager

## 2019-09-19 NOTE — Progress Notes (Signed)
Spoke with patient at the bedside. States that she was diagnosed with diabetes in 1994. Has been on Lantus before, but states that "it doesn't do anything." Patient is on a [employer]tr[employer] pump at home and using Novolin Regular insulin due to cost. Patient states that she took the [place] vaccine last Monday on 3/1 and had a severe reaction and began having low blood sugars over the next few days. She also lost her appetite and has not been able to eat much until today. Her physician told her that she should not get the second vaccine.  She states that her pump is set at : 4.9 units/hr 8 am-8 pm                                                           4.0 units/hr 8 pm-8 am   Total: 106.8 units/ 24 hours She does bolus for her CHO intake, but did not know the CHO coverage per pump.   She has been refusing some of the Lantus due to the fact that she believes it doesn't do anything for her blood sugars. She is just glad that her blood sugars are not low. She seems to know her blood sugar responses very well.  Will continue to monitor blood sugars while in the hospital and will give recommendations if needed.   Harvel Ricks RN BSN CDE Diabetes Coordinator Pager: [phone]  8am-5pm

## 2019-09-19 NOTE — Progress Notes (Signed)
Pt complaining of nausea and indigestion- Gave Zofran with some relief. Now complaining of weakness-and SOB however her O2 sats are 97% on RA- (she wanted to try some O2 to see if that helped) and cp- EKG done and I paged [facility]) with this information. Will continue to monitor closely and await orders.

## 2019-09-20 ENCOUNTER — Inpatient Hospital Stay (HOSPITAL_COMMUNITY): Payer: Medicare Other | Admitting: Anesthesiology

## 2019-09-20 ENCOUNTER — Encounter (HOSPITAL_COMMUNITY): Payer: Self-pay | Admitting: Family Medicine

## 2019-09-20 ENCOUNTER — Inpatient Hospital Stay (HOSPITAL_COMMUNITY): Payer: Medicare Other

## 2019-09-20 ENCOUNTER — Encounter (HOSPITAL_COMMUNITY)
Admission: EM | Disposition: A | Discharge status: Home Health | Payer: Self-pay | Source: Home / Self Care | Attending: Internal Medicine

## 2019-09-20 DIAGNOSIS — I340 Nonrheumatic mitral (valve) insufficiency: Secondary | ICD-10-CM

## 2019-09-20 DIAGNOSIS — Z950 Presence of cardiac pacemaker: Secondary | ICD-10-CM

## 2019-09-20 DIAGNOSIS — I361 Nonrheumatic tricuspid (valve) insufficiency: Secondary | ICD-10-CM

## 2019-09-20 DIAGNOSIS — I480 Paroxysmal atrial fibrillation: Secondary | ICD-10-CM

## 2019-09-20 HISTORY — PX: BUBBLE STUDY: SHX6837

## 2019-09-20 HISTORY — PX: TEE WITHOUT CARDIOVERSION: SHX5443

## 2019-09-20 LAB — COMPREHENSIVE METABOLIC PANEL
ALT: 157 U/L — ABNORMAL HIGH (ref 0–44)
AST: 70 U/L — ABNORMAL HIGH (ref 15–41)
Albumin: 3.4 g/dL — ABNORMAL LOW (ref 3.5–5.0)
Alkaline Phosphatase: 64 U/L (ref 38–126)
Anion gap: 14 (ref 5–15)
BUN: 78 mg/dL — ABNORMAL HIGH (ref 8–23)
CO2: 26 mmol/L (ref 22–32)
Calcium: 9.1 mg/dL (ref 8.9–10.3)
Chloride: 83 mmol/L — ABNORMAL LOW (ref 98–111)
Creatinine, Ser: 2.63 mg/dL — ABNORMAL HIGH (ref 0.44–1.00)
GFR calc Af Amer: 21 mL/min — ABNORMAL LOW (ref >60)
GFR calc non Af Amer: 18 mL/min — ABNORMAL LOW (ref >60)
Glucose, Bld: 180 mg/dL — ABNORMAL HIGH (ref 70–99)
Potassium: 4.4 mmol/L (ref 3.5–5.1)
Sodium: 123 mmol/L — ABNORMAL LOW (ref 135–145)
Total Bilirubin: 1.1 mg/dL (ref 0.3–1.2)
Total Protein: 5.8 g/dL — ABNORMAL LOW (ref 6.5–8.1)

## 2019-09-20 LAB — GLUCOSE, CAPILLARY
Glucose-Capillary: 167 mg/dL — ABNORMAL HIGH (ref 70–99)
Glucose-Capillary: 186 mg/dL — ABNORMAL HIGH (ref 70–99)
Glucose-Capillary: 234 mg/dL — ABNORMAL HIGH (ref 70–99)
Glucose-Capillary: 248 mg/dL — ABNORMAL HIGH (ref 70–99)

## 2019-09-20 LAB — MAGNESIUM: Magnesium: 3 mg/dL — ABNORMAL HIGH (ref 1.7–2.4)

## 2019-09-20 SURGERY — ECHOCARDIOGRAM, TRANSESOPHAGEAL
Anesthesia: Monitor Anesthesia Care

## 2019-09-20 MED ORDER — PROPOFOL 500 MG/50ML IV EMUL
INTRAVENOUS | Status: DC | PRN
  Administered 2019-09-20: 100 ug/kg/min via INTRAVENOUS

## 2019-09-20 MED ORDER — PROPOFOL 10 MG/ML IV BOLUS
INTRAVENOUS | Status: DC | PRN
  Administered 2019-09-20 (×2): 10 mg via INTRAVENOUS

## 2019-09-20 MED ORDER — CARVEDILOL 25 MG PO TABS
25.0000 mg | ORAL_TABLET | Freq: Two times a day (BID) | ORAL | Status: DC
  Administered 2019-09-20 – 2019-09-26 (×12): 25 mg via ORAL
  Filled 2019-09-20 (×12): qty 1

## 2019-09-20 MED ORDER — MELATONIN 3 MG PO TABS
6.0000 mg | ORAL_TABLET | Freq: Once | ORAL | Status: DC
  Filled 2019-09-20: qty 2

## 2019-09-20 MED ORDER — SODIUM CHLORIDE BACTERIOSTATIC 0.9 % IJ SOLN
INTRAMUSCULAR | Status: DC | PRN
  Administered 2019-09-20: 9 mL via INTRAVENOUS

## 2019-09-20 MED ORDER — NON FORMULARY: 6.0000 mg | Freq: Once | Status: DC

## 2019-09-20 MED ORDER — INSULIN GLARGINE 100 UNIT/ML ~~LOC~~ SOLN
22.0000 [IU] | Freq: Two times a day (BID) | SUBCUTANEOUS | Status: DC
  Administered 2019-09-20 – 2019-09-26 (×11): 22 [IU] via SUBCUTANEOUS
  Filled 2019-09-20 (×13): qty 0.22

## 2019-09-20 MED ORDER — SODIUM CHLORIDE 0.9 % IV SOLN: INTRAVENOUS | Status: DC | PRN

## 2019-09-20 NOTE — Interval H&P Note (Signed)
History and Physical Interval Note:  09/20/2019 7:31 AM  Sara Walker  has presented today for surgery, with the diagnosis of A-FIB.  The various methods of treatment have been discussed with the patient and family. After consideration of risks, benefits and other options for treatment, the patient has consented to  Procedure(s): TRANSESOPHAGEAL ECHOCARDIOGRAM (TEE) (N/A) CARDIOVERSION (N/A) as a surgical intervention.  The patient's history has been reviewed, patient examined, no change in status, stable for surgery.  I have reviewed the patient's chart and labs.  Questions were answered to the patient's satisfaction.     Fransico Him

## 2019-09-20 NOTE — Progress Notes (Signed)
  Echocardiogram Echocardiogram Transesophageal has been performed.  Geoffery Lyons Swaim 09/20/2019, 9:00 AM

## 2019-09-20 NOTE — Anesthesia Procedure Notes (Signed)
Procedure Name: MAC Date/Time: 09/20/2019 8:20 AM Performed by: Harden Mo, CRNA Pre-anesthesia Checklist: Patient identified, Emergency Drugs available, Suction available, Patient being monitored and Timeout performed Patient Re-evaluated:Patient Re-evaluated prior to induction Oxygen Delivery Method: Nasal cannula Preoxygenation: Pre-oxygenation with 100% oxygen Induction Type: IV induction Placement Confirmation: positive ETCO2 and breath sounds checked- equal and bilateral Dental Injury: Teeth and Oropharynx as per pre-operative assessment

## 2019-09-20 NOTE — Progress Notes (Signed)
[facility] PROGRESS NOTE    Progress Note  Sara Walker  HBZ:[id number] DOB: 1950-09-17 DOA: 09/16/2019 PCP: Deland Pretty, MD     Brief Narrative:   Sara Walker is an 69 y.o. female past medical history of breast cancer treated with Adriamycin and radiation, chronic combined systolic and diastolic heart failure with an EF of 20%, insulin-dependent diabetes mellitus type 2, with a history of polymorphic VT status post AICD presents to the emergency room with shortness of breath chest discomfort and A. fib with RVR.  She received her first dose of SARS-CoV-2 on 09/09/2019 20 after she started developing nausea generalized weakness shortness of breath chest discomfort and palpitation.  In the ED she was found to be afebrile satting greater than 90%, her blood pressure was in the 100s, EKG showed A. fib with RVR with a heart rate of 125, chest x-ray showed pulmonary edema her sodium was 128, creatinine 1.8.  Assessment/Plan:   New onset Atrial fibrillation with RVR (HCC) She was started on IV diltiazem her chads VASC2 is greater than 4, was previously on anticoagulation but she was hesitant to take it.  Due to her recent vaginal bleed. TEE performed on 09/20/2019 showed a severely dilated left ventricle with an EF of 20 to 25% severely dilated left atrium with a thrombus in the appendage.  Acute on chronic combined systolic and diastolic heart failure/with a history of nonischemic cardiomyopathy secondary to Adriamycin: With a BNP greater than 1000, chest x-ray with bilateral pulmonary infiltrates, with positive JVD and lower extremity edema. She was started on IV diuresis, 2D echo showed lateral and septal disynchrony and paradoxical septal motion. EF of 25% with global hypokinesia, right ventricle systolic function is mildly reduced with biatrial enlargement.  TEE showed a left atrial thrombus in the appendage cardioversion cannot be performed. She remains fluid overloaded on physical  exam.  Acute kidney injury: With a baseline creatinine of less than 1, on admission 1.8.  She was started on IV Lasix and her creatinine has continued to trend up.  Renal recommended to restart her home dose of torsemide, further management of diuretic per renal. Cr is worsening.  Hypervolemic hyponatremia: Renal recommended urine lites which is patient has refused. Her sodium this morning is worst.  Insulin-dependent diabetes mellitus type 2/hyperglycemia: Her insulin pump was discontinued. She was started on long-acting insulin per sliding scale her blood glucose significantly elevated, will increase her long-acting noting that her creatinine is mildly elevated.  VT: In 2016 she was noted to be having torsades in the setting of major electrolyte abnormalities and heart failure exacerbation. Monitoring in telemetry with no events.  Hyperlipidemia: Continue Repatha.  Hypokalemia: We will give her single dose orally. Check a magnesium level.  Constipation: She relates her last bowel movement was 2 days ago and it was very hard we will start her on MiraLAX.  DVT prophylaxis: heparin Family Communication:none Disposition Plan/Barrier to D/C: Patient can be discharged once her heart rate and her creatinine has improved.  Code Status:     Code Status Orders  (From admission, onward)         Start     Ordered   09/17/19 0333  Full code  Continuous     09/17/19 0335        Code Status History    Date Active Date Inactive Code Status Order ID Comments User Context   04/04/2019 0636 04/04/2019 1149 Full Code [id number]  Everlene Farrier, MD Inpatient   02/23/2018  1448 02/23/2018 1904 Full Code [id number]  Sanda Klein, MD Inpatient   08/28/2013 1846 08/30/2013 1413 Full Code [id number]  Geradine Girt, DO Inpatient   Advance Care Planning Activity    Advance Directive Documentation     Most Recent Value  Type of Advance Directive  Healthcare Power of [place], Living will    Pre-existing out of facility DNR order (yellow form or pink MOST form)  --  "MOST" Form in Place?  --        IV Access:    Peripheral IV   Procedures and diagnostic studies:   ECHOCARDIOGRAM LIMITED  Result Date: 09/19/2019    ECHOCARDIOGRAM LIMITED REPORT   Patient Name:   Sara Walker Date of Exam: 09/19/2019 Medical Rec #:  [id number]      Height:       68.0 in Accession #:    [id number]     Weight:       263.4 lb Date of Birth:  1951-02-21      BSA:          2.297 m Patient Age:    72 years       BP:           108/65 mmHg Patient Gender: F              HR:           70 bpm. Exam Location:  Inpatient Procedure: Limited Echo and Intracardiac Opacification Agent Indications:    Reason for exam-Echo Cardiomyopathy-Unspecified 425.9 / I42.9  History:        Patient has prior history of Echocardiogram examinations, most                 recent 09/17/2019. CHF and Cardiomyopathy, Biventricular ICD                 (implantable cardioverter-defibrillator) in place,                 Arrythmias:Atrial Fibrillation and LBBB; Risk                 Factors:Hypertension, Dyslipidemia, Diabetes and Obesity.  Sonographer:    Vikki Ports Turrentine Referring Phys: [id number] [place]  Sonographer Comments: Image acquisition challenging due to patient body habitus. IMPRESSIONS  1. Left ventricular ejection fraction, by estimation, is 25 to 30%. The left ventricle has severely decreased function. The left ventricle demonstrates global hypokinesis. The left ventricular internal cavity size was severely dilated. There is mild concentric left ventricular hypertrophy. Left ventricular diastolic parameters are indeterminate.  2. Mild mitral valve regurgitation.  3. The aortic valve is tricuspid.  4. There is moderately elevated pulmonary artery systolic pressure.  5. The inferior vena cava is dilated in size with <50% respiratory variability, suggesting right atrial pressure of 15 mmHg. FINDINGS  Left Ventricle: Global  hypokinesis with akinesis of the inferoseptal and inferior myocardium. Left ventricular ejection fraction, by estimation, is 25 to 30%. The left ventricle has severely decreased function. The left ventricle demonstrates global hypokinesis. Definity contrast agent was given IV to delineate the left ventricular endocardial borders. The left ventricular internal cavity size was severely dilated. There is mild concentric left ventricular hypertrophy. Right Ventricle: There is moderately elevated pulmonary artery systolic pressure. The tricuspid regurgitant velocity is 2.75 m/s, and with an assumed right atrial pressure of 15 mmHg, the estimated right ventricular systolic pressure is 93.2 mmHg. Pericardium: Trivial pericardial effusion is present. Mitral Valve: Mild mitral valve regurgitation. Tricuspid Valve:  Tricuspid valve regurgitation is mild. Aortic Valve: The aortic valve is tricuspid. Pulmonic Valve: Pulmonic valve regurgitation is trivial. Venous: The inferior vena cava is dilated in size with less than 50% respiratory variability, suggesting right atrial pressure of 15 mmHg. Additional Comments: A pacer wire is visualized.  LEFT VENTRICLE PLAX 2D LVIDd:         7.10 cm LVIDs:         6.22 cm LV PW:         1.04 cm LV IVS:        1.10 cm  MITRAL VALVE                TRICUSPID VALVE MV Area (PHT): 4.60 cm     TR Peak grad:   30.2 mmHg MV Decel Time: 165 msec     TR Vmax:        275.00 cm/s MV E velocity: 117.00 cm/s MV A velocity: 42.20 cm/s MV E/A ratio:  2.77 Skeet Latch MD Electronically signed by Skeet Latch MD Signature Date/Time: 09/19/2019/3:36:30 PM    Final      Medical Consultants:    None.  Anti-Infectives:   None  Subjective:    ATHELENE HURSEY no complains  Objective:    Vitals:   09/20/19 0730 09/20/19 0859 09/20/19 0918 09/20/19 0950  BP: (!) 141/85 (!) 122/59 137/89 130/68  Pulse:  (!) 44 68 72  Resp: 19 (!) 25 (!) 25 20  Temp: (!) 97.1 F (36.2 C) 97.6 F (36.4  C)  98 F (36.7 C)  TempSrc: Oral Axillary  Oral  SpO2: 98% 99% 97%   Weight: 119.5 kg     Height: 5\' 8"  (1.727 m)      SpO2: 97 % O2 Flow Rate (L/min): 6 L/min   Intake/Output Summary (Last 24 hours) at 09/20/2019 1012 Last data filed at 09/19/2019 1700 Gross per 24 hour  Intake 240 ml  Output 900 ml  Net -660 ml   Filed Weights   09/18/19 0407 09/19/19 0409 09/20/19 0730  Weight: 118.1 kg 119.5 kg 119.5 kg    Exam: General exam: In no acute distress. Respiratory system: Good air movement and clear to auscultation. Cardiovascular system: S1 & S2 heard, RRR. No JVD.  Gastrointestinal system: Abdomen is nondistended, soft and nontender.  Central nervous system: Alert and oriented. No focal neurological deficits. Extremities: 2+ edema. Skin: No rashes, lesions or ulcers Psychiatry: Judgement and insight appear normal. Mood & affect appropriate.    Data Reviewed:    Labs: Basic Metabolic Panel: Recent Labs  Lab 09/16/19 2349 09/16/19 2349 09/17/19 0359 09/17/19 0359 09/18/19 0516 09/18/19 0516 09/19/19 0441 09/19/19 1013 09/20/19 0004  NA 128*  --  128*  --  125*  --  123*  --  123*  K 4.0   < > 4.0   < > 3.6   < > 3.4*  --  4.4  CL 90*  --  87*  --  86*  --  86*  --  83*  CO2 23  --  22  --  22  --  23  --  26  GLUCOSE 69*  --  113*  --  295*  --  197*  --  180*  BUN 48*  --  52*  --  67*  --  75*  --  78*  CREATININE 1.82*  --  1.88*  --  2.11*  --  2.35*  --  2.63*  CALCIUM 9.7  --  9.6  --  9.2  --  8.8*  --  9.1  MG  --   --  2.3  --   --   --   --  2.7* 3.0*   < > = values in this interval not displayed.   GFR Estimated Creatinine Clearance: 27.8 mL/min (A) (by C-G formula based on SCr of 2.63 mg/dL (H)). Liver Function Tests: Recent Labs  Lab 09/20/19 0004  AST 70*  ALT 157*  ALKPHOS 64  BILITOT 1.1  PROT 5.8*  ALBUMIN 3.4*   No results for input(s): LIPASE, AMYLASE in the last 168 hours. No results for input(s): AMMONIA in the last 168  hours. Coagulation profile Recent Labs  Lab 09/19/19 1601  INR 1.4*   COVID-19 Labs  No results for input(s): DDIMER, FERRITIN, LDH, CRP in the last 72 hours.  Lab Results  Component Value Date   SARSCOV2NAA NEGATIVE 09/17/2019   [place] NEGATIVE 04/02/2019    CBC: Recent Labs  Lab 09/16/19 2349 09/17/19 0359 09/18/19 0516  WBC 11.8* 11.0* 9.1  HGB 13.9 13.1 12.7  HCT 43.9 40.8 38.9  MCV 94.0 93.8 91.1  PLT 286 258 269   Cardiac Enzymes: No results for input(s): CKTOTAL, CKMB, CKMBINDEX, TROPONINI in the last 168 hours. BNP (last 3 results) No results for input(s): PROBNP in the last 8760 hours. CBG: Recent Labs  Lab 09/19/19 1609 09/19/19 1815 09/19/19 2018 09/20/19 0602 09/20/19 0946  GLUCAP 236* 237* 210* 167* 186*   D-Dimer: No results for input(s): DDIMER in the last 72 hours. Hgb A1c: No results for input(s): HGBA1C in the last 72 hours. Lipid Profile: No results for input(s): CHOL, HDL, LDLCALC, TRIG, CHOLHDL, LDLDIRECT in the last 72 hours. Thyroid function studies: No results for input(s): TSH, T4TOTAL, T3FREE, THYROIDAB in the last 72 hours.  Invalid input(s): FREET3 Anemia work up: No results for input(s): VITAMINB12, FOLATE, FERRITIN, TIBC, IRON, RETICCTPCT in the last 72 hours. Sepsis Labs: Recent Labs  Lab 09/16/19 2349 09/17/19 0359 09/18/19 0516  WBC 11.8* 11.0* 9.1   Microbiology Recent Results (from the past 240 hour(s))  SARS CORONAVIRUS 2 (TAT 6-24 HRS) Nasopharyngeal Nasopharyngeal Swab     Status: None   Collection Time: 09/17/19 12:04 AM   Specimen: Nasopharyngeal Swab  Result Value Ref Range Status   SARS Coronavirus 2 NEGATIVE NEGATIVE Final    Comment: (NOTE) SARS-CoV-2 target nucleic acids are NOT DETECTED. The SARS-CoV-2 RNA is generally detectable in upper and lower respiratory specimens during the acute phase of infection. Negative results do not preclude SARS-CoV-2 infection, do not rule out co-infections  with other pathogens, and should not be used as the sole basis for treatment or other patient management decisions. Negative results must be combined with clinical observations, patient history, and epidemiological information. The expected result is Negative. Fact Sheet for Patients: SugarRoll.be Fact Sheet for Healthcare Providers: https://www.woods-mathews.com/ This test is not yet approved or cleared by the [place] FDA and  has been authorized for detection and/or diagnosis of SARS-CoV-2 by FDA under an Emergency Use Authorization (EUA). This EUA will remain  in effect (meaning this test can be used) for the duration of the COVID-19 declaration under Section 56 4(b)(1) of the Act, 21 U.S.C. section 360bbb-3(b)(1), unless the authorization is terminated or revoked sooner. Performed at [facility], [place] [address], [place], [place] [address]      Medications:   . allopurinol  450 mg Oral Daily  . apixaban  5 mg  Oral BID  . carvedilol  12.5 mg Oral BID  . insulin aspart  0-5 Units Subcutaneous QHS  . insulin aspart  0-9 Units Subcutaneous TID WC  . insulin aspart  6 Units Subcutaneous TID WC  . insulin glargine  20 Units Subcutaneous BID  . polyethylene glycol  17 g Oral BID  . potassium chloride  40 mEq Oral Daily  . sodium chloride flush  3 mL Intravenous Q12H  . sodium chloride flush  3 mL Intravenous Q12H  . torsemide  50 mg Oral BID   Continuous Infusions: . sodium chloride    . diltiazem (CARDIZEM) infusion 5 mg/hr (09/20/19 0605)      LOS: 3 days   Charlynne Cousins  [facility]  09/20/2019, 10:12 AM

## 2019-09-20 NOTE — Progress Notes (Signed)
Subjective:  At least 900 of UOP weight stable -  BP seems OK-  mostly over 100- crt up just a bit-  sodium stable.  Had TEE but couldn't be cardioverted b/c thrombus in appendage.  Frustrated and tired at present   Objective Vital signs in last 24 hours: Vitals:   09/20/19 0730 09/20/19 0859 09/20/19 0918 09/20/19 0950  BP: (!) 141/85 (!) 122/59 137/89 130/68  Pulse:  (!) 44 68 72  Resp: 19 (!) 25 (!) 25 20  Temp: (!) 97.1 F (36.2 C) 97.6 F (36.4 C)  98 F (36.7 C)  TempSrc: Oral Axillary  Oral  SpO2: 98% 99% 97%   Weight: 119.5 kg     Height: 5\' 8"  (1.727 m)      Weight change:   Intake/Output Summary (Last 24 hours) at 09/20/2019 1227 Last data filed at 09/20/2019 1100 Gross per 24 hour  Intake 240 ml  Output 900 ml  Net -660 ml    Assessment/ Plan: Pt is a 69 y.o. yo female with HTN, CHF and Afib who was admitted on 09/16/2019 with Afib with RVR and volume overload-  Baseline crt around 1-  Has also developed AKI  Assessment/Plan: 1. AKI-  U/A is bland-  Seeming to be hemodynamic in cause.  Hopefully since we have seen some more consistent hemodynamics will start to see renal function plateau and improve.  The fact that her renal function is normal at baseline is a good thing.  No indication for dialysis today 2. HTN/vol-  Volume overloaded with soft BP-- HR seems stable on cardizem drip as well as max dose coreg.  In the interest of achieving more kidney perfusion I  decreased the coreg (it was taken back up) still on cardizem 3. Hyponatremia-  Seeming like hypervolemic hyponatremia - urine osm is appropriately low.  Should be treated with loop diuretic.  Likes torsemide, will resume.  She normally takes 100 of torsemide at home daily with occasional metolazone-  And also 50 of aldactone.  Should resume the home dose of torsemide at least.  Seems to be having reasonable UOP-  Sodium stable  4.  Afib-  On cardizem dip-  With pacer-  Seems like rate has been OK of late-  Per cards   5,  Hypokalemia-  Give 40 of k dur today- ended up getting 120 and then 40 daily-  Need to be careful with crt going up.  Magnesium high     Louis Meckel    Labs: Basic Metabolic Panel: Recent Labs  Lab 09/18/19 0516 09/19/19 0441 09/20/19 0004  NA 125* 123* 123*  K 3.6 3.4* 4.4  CL 86* 86* 83*  CO2 22 23 26   GLUCOSE 295* 197* 180*  BUN 67* 75* 78*  CREATININE 2.11* 2.35* 2.63*  CALCIUM 9.2 8.8* 9.1   Liver Function Tests: Recent Labs  Lab 09/20/19 0004  AST 70*  ALT 157*  ALKPHOS 64  BILITOT 1.1  PROT 5.8*  ALBUMIN 3.4*   No results for input(s): LIPASE, AMYLASE in the last 168 hours. No results for input(s): AMMONIA in the last 168 hours. CBC: Recent Labs  Lab 09/16/19 2349 09/17/19 0359 09/18/19 0516  WBC 11.8* 11.0* 9.1  HGB 13.9 13.1 12.7  HCT 43.9 40.8 38.9  MCV 94.0 93.8 91.1  PLT 286 258 269   Cardiac Enzymes: No results for input(s): CKTOTAL, CKMB, CKMBINDEX, TROPONINI in the last 168 hours. CBG: Recent Labs  Lab 09/19/19 1609 09/19/19 1815 09/19/19  2018 09/20/19 0602 09/20/19 0946  GLUCAP 236* 237* 210* 167* 186*    Iron Studies: No results for input(s): IRON, TIBC, TRANSFERRIN, FERRITIN in the last 72 hours. Studies/Results: ECHOCARDIOGRAM LIMITED  Result Date: 09/19/2019    ECHOCARDIOGRAM LIMITED REPORT   Patient Name:   Sara Walker Date of Exam: 09/19/2019 Medical Rec #:  [id number]      Height:       68.0 in Accession #:    [id number]     Weight:       263.4 lb Date of Birth:  01-31-1951      BSA:          2.297 m Patient Age:    69 years       BP:           108/65 mmHg Patient Gender: F              HR:           70 bpm. Exam Location:  Inpatient Procedure: Limited Echo and Intracardiac Opacification Agent Indications:    Reason for exam-Echo Cardiomyopathy-Unspecified 425.9 / I42.9  History:        Patient has prior history of Echocardiogram examinations, most                 recent 09/17/2019. CHF and Cardiomyopathy,  Biventricular ICD                 (implantable cardioverter-defibrillator) in place,                 Arrythmias:Atrial Fibrillation and LBBB; Risk                 Factors:Hypertension, Dyslipidemia, Diabetes and Obesity.  Sonographer:    Vikki Ports Turrentine Referring Phys: [id number] [place]  Sonographer Comments: Image acquisition challenging due to patient body habitus. IMPRESSIONS  1. Left ventricular ejection fraction, by estimation, is 25 to 30%. The left ventricle has severely decreased function. The left ventricle demonstrates global hypokinesis. The left ventricular internal cavity size was severely dilated. There is mild concentric left ventricular hypertrophy. Left ventricular diastolic parameters are indeterminate.  2. Mild mitral valve regurgitation.  3. The aortic valve is tricuspid.  4. There is moderately elevated pulmonary artery systolic pressure.  5. The inferior vena cava is dilated in size with <50% respiratory variability, suggesting right atrial pressure of 15 mmHg. FINDINGS  Left Ventricle: Global hypokinesis with akinesis of the inferoseptal and inferior myocardium. Left ventricular ejection fraction, by estimation, is 25 to 30%. The left ventricle has severely decreased function. The left ventricle demonstrates global hypokinesis. Definity contrast agent was given IV to delineate the left ventricular endocardial borders. The left ventricular internal cavity size was severely dilated. There is mild concentric left ventricular hypertrophy. Right Ventricle: There is moderately elevated pulmonary artery systolic pressure. The tricuspid regurgitant velocity is 2.75 m/s, and with an assumed right atrial pressure of 15 mmHg, the estimated right ventricular systolic pressure is 33.3 mmHg. Pericardium: Trivial pericardial effusion is present. Mitral Valve: Mild mitral valve regurgitation. Tricuspid Valve: Tricuspid valve regurgitation is mild. Aortic Valve: The aortic valve is tricuspid. Pulmonic  Valve: Pulmonic valve regurgitation is trivial. Venous: The inferior vena cava is dilated in size with less than 50% respiratory variability, suggesting right atrial pressure of 15 mmHg. Additional Comments: A pacer wire is visualized.  LEFT VENTRICLE PLAX 2D LVIDd:         7.10 cm LVIDs:  6.22 cm LV PW:         1.04 cm LV IVS:        1.10 cm  MITRAL VALVE                TRICUSPID VALVE MV Area (PHT): 4.60 cm     TR Peak grad:   30.2 mmHg MV Decel Time: 165 msec     TR Vmax:        275.00 cm/s MV E velocity: 117.00 cm/s MV A velocity: 42.20 cm/s MV E/A ratio:  2.77 Skeet Latch MD Electronically signed by Skeet Latch MD Signature Date/Time: 09/19/2019/3:36:30 PM    Final    Medications: Infusions:   Scheduled Medications: . allopurinol  450 mg Oral Daily  . apixaban  5 mg Oral BID  . carvedilol  25 mg Oral BID  . insulin aspart  0-5 Units Subcutaneous QHS  . insulin aspart  0-9 Units Subcutaneous TID WC  . insulin aspart  6 Units Subcutaneous TID WC  . insulin glargine  22 Units Subcutaneous BID  . polyethylene glycol  17 g Oral BID  . potassium chloride  40 mEq Oral Daily  . sodium chloride flush  3 mL Intravenous Q12H  . torsemide  50 mg Oral BID    have reviewed scheduled and prn medications.  Physical Exam: General: in bed depressed they could not do cardioversion  Heart: paced RRR Lungs: mostly clear Abdomen: obese Extremities: pitting edema    09/20/2019,12:27 PM  LOS: 3 days

## 2019-09-20 NOTE — Transfer of Care (Signed)
Immediate Anesthesia Transfer of Care Note  Patient: Sara Walker  Procedure(s) Performed: TRANSESOPHAGEAL ECHOCARDIOGRAM (TEE) (N/A ) BUBBLE STUDY  Patient Location: Endoscopy Unit  Anesthesia Type:MAC  Level of Consciousness: awake and drowsy  Airway & Oxygen Therapy: Patient Spontanous Breathing and Patient connected to nasal cannula oxygen  Post-op Assessment: Report given to RN and Post -op Vital signs reviewed and stable  Post vital signs: Reviewed and stable  Last Vitals:  Vitals Value Taken Time  BP    Temp    Pulse 44 09/20/19 0859  Resp 25 09/20/19 0859  SpO2 99 % 09/20/19 0859    Last Pain:  Vitals:   09/20/19 0730  TempSrc: Oral  PainSc: 0-No pain      Patients Stated Pain Goal: 0 (43/60/67 7034)  Complications: No apparent anesthesia complications

## 2019-09-20 NOTE — Progress Notes (Addendum)
Progress Note  Patient Name: Sara Walker Date of Encounter: 09/20/2019  Primary Cardiologist: Sanda Klein, MD   Subjective   The patient underwent TEE this am, she wasn't cardioverted as there was a thrombus in her appendage.   Inpatient Medications    Scheduled Meds: . allopurinol  450 mg Oral Daily  . apixaban  5 mg Oral BID  . carvedilol  12.5 mg Oral BID  . insulin aspart  0-5 Units Subcutaneous QHS  . insulin aspart  0-9 Units Subcutaneous TID WC  . insulin aspart  6 Units Subcutaneous TID WC  . insulin glargine  22 Units Subcutaneous BID  . polyethylene glycol  17 g Oral BID  . potassium chloride  40 mEq Oral Daily  . sodium chloride flush  3 mL Intravenous Q12H  . sodium chloride flush  3 mL Intravenous Q12H  . torsemide  50 mg Oral BID   Continuous Infusions: . sodium chloride    . diltiazem (CARDIZEM) infusion 5 mg/hr (09/20/19 0605)   PRN Meds: sodium chloride, acetaminophen **OR** acetaminophen, alum & mag hydroxide-simeth, HYDROcodone-acetaminophen, ondansetron **OR** ondansetron (ZOFRAN) IV, sodium chloride flush, zolpidem   Vital Signs    Vitals:   09/20/19 0730 09/20/19 0859 09/20/19 0918 09/20/19 0950  BP: (!) 141/85 (!) 122/59 137/89 130/68  Pulse:  (!) 44 68 72  Resp: 19 (!) 25 (!) 25 20  Temp: (!) 97.1 F (36.2 C) 97.6 F (36.4 C)  98 F (36.7 C)  TempSrc: Oral Axillary  Oral  SpO2: 98% 99% 97%   Weight: 119.5 kg     Height: 5\' 8"  (1.727 m)       Intake/Output Summary (Last 24 hours) at 09/20/2019 1101 Last data filed at 09/19/2019 1700 Gross per 24 hour  Intake 240 ml  Output 900 ml  Net -660 ml   Last 3 Weights 09/20/2019 09/19/2019 09/18/2019  Weight (lbs) 263 lb 6.4 oz 263 lb 6.4 oz 260 lb 6.4 oz  Weight (kg) 119.477 kg 119.477 kg 118.117 kg      Telemetry    Atrial fibrillation, ventricular pacing- Personally Reviewed  ECG    No new tracing- Personally Reviewed  Physical Exam   GEN:  Morbidly obese Neck: No JVD  Cardiac: iRRR, no murmurs, rubs, or gallops.  Respiratory:  Minimal crackles bilaterally. GI: Soft, nontender, morbidly obese and distended  MS:  1+ bilateral edema; No deformity. Neuro:  Nonfocal  Psych: Normal affect   Labs    High Sensitivity Troponin:   Recent Labs  Lab 09/16/19 2349 09/17/19 0149  TROPONINIHS 39* 38*      Chemistry Recent Labs  Lab 09/18/19 0516 09/19/19 0441 09/20/19 0004  NA 125* 123* 123*  K 3.6 3.4* 4.4  CL 86* 86* 83*  CO2 22 23 26   GLUCOSE 295* 197* 180*  BUN 67* 75* 78*  CREATININE 2.11* 2.35* 2.63*  CALCIUM 9.2 8.8* 9.1  PROT  --   --  5.8*  ALBUMIN  --   --  3.4*  AST  --   --  70*  ALT  --   --  157*  ALKPHOS  --   --  64  BILITOT  --   --  1.1  GFRNONAA 23* 21* 18*  GFRAA 27* 24* 21*  ANIONGAP 17* 14 14     Hematology Recent Labs  Lab 09/16/19 2349 09/17/19 0359 09/18/19 0516  WBC 11.8* 11.0* 9.1  RBC 4.67 4.35 4.27  HGB 13.9 13.1 12.7  HCT 43.9 40.8 38.9  MCV 94.0 93.8 91.1  MCH 29.8 30.1 29.7  MCHC 31.7 32.1 32.6  RDW 14.7 14.6 14.7  PLT 286 258 269    BNP Recent Labs  Lab 09/16/19 2349  BNP 1,100.9*     DDimer No results for input(s): DDIMER in the last 168 hours.   Radiology    DG Chest Portable 1 View  Result Date: 09/17/2019 CLINICAL DATA:  Shortness of breath EXAM: PORTABLE CHEST 1 VIEW COMPARISON:  08/28/2013 FINDINGS: There is a multi lead left-sided pacemaker in place. The heart size is enlarged. There is developing pulmonary edema with small bilateral pleural effusions. There is no pneumothorax. There is significant shift of the trachea to the patient's left which is likely secondary to the patient's known enlarged thyroid gland. IMPRESSION: Findings consistent with congestive heart failure. Electronically Signed   By: Constance Holster M.D.   On: 09/17/2019 00:13   TTE: 09/17/2019   1. There is profound septal-lateral dyssynchrony, consistent with loss of  resynchronization pacing. Left  ventricular ejection fraction, by  estimation, is 25 to 30%. The left ventricle has severely decreased  function. The left ventricle demonstrates  global hypokinesis. The left ventricular internal cavity size was severely  dilated. Left ventricular diastolic function could not be evaluated. Left  ventricular diastolic function could not be evaluated.  2. Right ventricular systolic function is mildly reduced. The right  ventricular size is mildly enlarged. There is moderately elevated  pulmonary artery systolic pressure.  3. Left atrial size was severely dilated.  4. The mitral valve is grossly normal. Moderate to severe mitral valve  regurgitation.  5. Tricuspid valve regurgitation is mild to moderate.  6. The aortic valve is normal in structure. Aortic valve regurgitation is  not visualized.  7. The inferior vena cava is dilated in size with <50% respiratory  variability, suggesting right atrial pressure of 15 mmHg.    Patient Profile     69 y.o. 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 Caryl Comes and generator change in August 2019 [place] by Dr. Sallyanne Kuster, history of paroxysmal A. fib and polymorphic VT requiring ICD shocks (the setting of hypokalemia), LBBB, chronic renal insufficiency stage III, hyperlipidemia, remote breast cancer.  Diabetes with insulin pump who is being seen today for the evaluation of atrial fibrillation and heart failure. Se is intolerant to multiple medications including ACE inhibitor, arm, and Entresto.  She is intolerant to statins and refusing to take anticoagulation secondary to prior history of vaginal bleeding that was addressed by [facility] and placement of IUD last year. She was last seen by Dr. Lequita Halt December 2020 when defibrillator test was performed and device function was normal.  Reported that she always had a relatively high LV lead pacing  threshold.  She has not had recurrent VT VF therapy since 2016. Dr. Loletha Grayer was notified on September 13, 2019 that she is in persistent atrial fibrillation, the patient states that she received her first dose of COVID vaccine 8 days ago.  She has noticed palpitations last few days and worsening lower extremity edema, shortness of breath, paroxysmal nocturnal dyspnea and orthopnea, she basically has to sit up at night in order to breathe.    Assessment & Plan    Acute cute combined systolic and diastolic heart failure Cardiomyopathy S/P CRT-D A. Fib AKI Hypertension VT Hyperlipidemia  Her echocardiogram shows very poor LVEF 10 to 15%  with severe septum to lateral wall dyssynchrony and paradoxical septal motion, however this was obtained while the patient was in A. fib with RVR and noncapturing, QRS appears widened compared to baseline but again acquired while it was not capturing.  Today's EKG shows atrial fibrillation,  ventricular paced rhythm with excellent capture at 70 bpm and narrow QRS complexes down 134 ms.    She underwent TEE this am with findings of a thrombus in the LAA, Severely dilated LV with severe LV dysfunction with EF 25-30%, Mildly dilated RV with mild RV dysfunction.  Severely dilated LA with intermittent spontaneous echo contrast.  The LA appendage appears to have a loosely formed thrombus in the body of the appendage. Normal TV with moderate to severe TR, moderate to severe mitral regurgitation likely related to annular dilatation.   The patient is now agreeable to anticoagulation, she was started on Eliquis 5 mg po BID, case management and pharmacy gave her free 1 month supply, they also applied for an assistance program.She was previously on Xarelto however experience vaginal bleeding that was treated by [facility] and polypectomy and placement of IUD, however she also had significant gastric reflux and is refusing to take it, Coumadin would not be an ideal drug for her.    We will plan  for an early follow up after the discharge, we will arrange for another TEE/cardioversion in 4 weeks, we will then re-evaluate degree of mitral regurgitation once she is in SR. She would be a poor candidate for open heart surgery, but might be a candidate for Mitraclip.  For HR control, I will d/c iv Cardizem and increase carvedilol 12.5 mg po BID.   Her kidney function is deteriorating, this is most probably related to low EF and a-fib with RVR. Nephrology follow, they restarted her torsemide at 50 mg PO BID. Baseline Crea 0.92, Now 1.8->2.11->2.35->2.63.   We will place consult for cardiac rehab, she is quite deconditioned.    For questions or updates, please contact [facility] Please consult [email or web address] for contact info under    Signed, Ena Dawley, MD  09/20/2019, 11:01 AM

## 2019-09-20 NOTE — Anesthesia Postprocedure Evaluation (Signed)
Anesthesia Post Note  Patient: Sara Walker  Procedure(s) Performed: TRANSESOPHAGEAL ECHOCARDIOGRAM (TEE) (N/A ) BUBBLE STUDY     Patient location during evaluation: Endoscopy Anesthesia Type: MAC Level of consciousness: awake and alert Pain management: pain level controlled Vital Signs Assessment: post-procedure vital signs reviewed and stable Respiratory status: spontaneous breathing, nonlabored ventilation and respiratory function stable Cardiovascular status: stable and blood pressure returned to baseline Postop Assessment: no apparent nausea or vomiting Anesthetic complications: no    Last Vitals:  Vitals:   09/20/19 0859 09/20/19 0918  BP: (!) 122/59 137/89  Pulse: (!) 44 68  Resp: (!) 25 (!) 25  Temp: 36.4 C   SpO2: 99% 97%    Last Pain:  Vitals:   09/20/19 0918  TempSrc:   PainSc: 0-No pain                 Riyad Keena,W. EDMOND

## 2019-09-20 NOTE — Evaluation (Signed)
Physical Therapy Evaluation Patient Details Name: Sara Walker MRN: [id number] DOB: [birth year] Today's Date: 09/20/2019   History of Present Illness  Patient is a 69 y/o female who presents with SOB and chest pain. Admitted with new onset A-fib with RVR. s/p TEE showing thrombus in LAA. PMH includes severe nonischemic cardiomyopathy, LBBB, HTN, HLD, breast ca, defibrillator, depression, CAD, CKD,  DM, COPD, CHF,  Clinical Impression  Patient presents with generalized weakness, pitting edema BLEs/feet, decreased activity tolerance, impaired balance and impaired mobility s/p above. Pt lives alone and reports being independent for ADLs/walking. Uses SPC vs w/c for longer distances. Pt has children that help with IADLs/grocery shopping. Today, pt tolerated short distance ambulation with use of RW for safety. Pt self limiting distance due to being cold and not wanting to walk into hall, "but I can." VSS on RA. Encouraged ankle pumps, elevating LEs and movement to help with swelling. Will follow acutely to maximize independence and mobility prior to return home.    Follow Up Recommendations No PT follow up    Equipment Recommendations  Rolling walker with 5" wheels(pending progress)    Recommendations for Other Services       Precautions / Restrictions Precautions Precautions: Fall Restrictions Weight Bearing Restrictions: No      Mobility  Bed Mobility               General bed mobility comments: Up in chair upon PT arrival.  Transfers Overall transfer level: Modified independent Equipment used: Rolling walker (2 wheeled)             General transfer comment: Stood from chair x1, no assist needed.  Ambulation/Gait Ambulation/Gait assistance: Min guard Gait Distance (Feet): 40 Feet Assistive device: Rolling walker (2 wheeled) Gait Pattern/deviations: Step-through pattern;Decreased stride length Gait velocity: decreased Gait velocity interpretation: 1.31 - 2.62  ft/sec, indicative of limited community ambulator General Gait Details: SLow, steady gait with RW for support; VSS with HR in 70s and Sp02 >91% on RA. Reports SOB is better. Declines further distance due to it "being cold in the hallway."  Stairs            Wheelchair Mobility    Modified Rankin (Stroke Patients Only)       Balance Overall balance assessment: Needs assistance Sitting-balance support: Feet supported;No upper extremity supported Sitting balance-Leahy Scale: Good Sitting balance - Comments: supervision   Standing balance support: During functional activity Standing balance-Leahy Scale: Fair                               Pertinent Vitals/Pain Pain Assessment: No/denies pain    Home Living Family/patient expects to be discharged to:: Private residence Living Arrangements: Alone Available Help at Discharge: Family;Available PRN/intermittently Type of Home: House Home Access: Ramped entrance   Entrance Stairs-Number of Steps: ramp on back Home Layout: One level Home Equipment: Wheelchair - manual;Cane - single point;Shower seat - built in;Bedside commode      Prior Function Level of Independence: Independent with assistive device(s)         Comments: Uses SPC and wc depending on long distances. Does her own ADLs. Drives. Family lives close by     Hand Dominance        Extremity/Trunk Assessment   Upper Extremity Assessment Upper Extremity Assessment: Defer to OT evaluation    Lower Extremity Assessment Lower Extremity Assessment: Generalized weakness(pitting edema present BLEs and into feet)    Cervical /  Trunk Assessment Cervical / Trunk Assessment: Normal  Communication   Communication: No difficulties  Cognition Arousal/Alertness: Awake/alert Behavior During Therapy: WFL for tasks assessed/performed Overall Cognitive Status: Within Functional Limits for tasks assessed                                  General Comments: irritated about current condition ("i should have never got that [place] vaccine"), inefficiency of hospital, the hospital food and not being able to sleep etc.      General Comments General comments (skin integrity, edema, etc.): VSS on RA.    Exercises     Assessment/Plan    PT Assessment Patient needs continued PT services  PT Problem List Decreased strength;Decreased mobility;Obesity;Decreased activity tolerance;Cardiopulmonary status limiting activity;Decreased balance       PT Treatment Interventions Therapeutic activities;Gait training;Therapeutic exercise;Patient/family education;Balance training;Functional mobility training    PT Goals (Current goals can be found in the Care Plan section)  Acute Rehab PT Goals Patient Stated Goal: to get some sleep and better food PT Goal Formulation: With patient Time For Goal Achievement: 10/04/19 Potential to Achieve Goals: Good    Frequency Min 3X/week   Barriers to discharge Decreased caregiver support lives alone    Co-evaluation               AM-PAC PT "6 Clicks" Mobility  Outcome Measure Help needed turning from your back to your side while in a flat bed without using bedrails?: A Little Help needed moving from lying on your back to sitting on the side of a flat bed without using bedrails?: A Little Help needed moving to and from a bed to a chair (including a wheelchair)?: None Help needed standing up from a chair using your arms (e.g., wheelchair or bedside chair)?: None Help needed to walk in hospital room?: A Little Help needed climbing 3-5 steps with a railing? : A Little 6 Click Score: 20    End of Session   Activity Tolerance: Patient tolerated treatment well Patient left: in chair;with call bell/phone within reach Nurse Communication: Mobility status PT Visit Diagnosis: Muscle weakness (generalized) (M62.81);Difficulty in walking, not elsewhere classified (R26.2)    Time:  5329-9242 PT Time Calculation (min) (ACUTE ONLY): 23 min   Charges:   PT Evaluation $PT Eval Moderate Complexity: 1 Mod PT Treatments $Therapeutic Activity: 8-22 mins        Marisa Severin, PT, DPT Acute Rehabilitation Services Pager [phone] Office [phone]      Marguarite Arbour A Sabra Heck 09/20/2019, 3:45 PM

## 2019-09-20 NOTE — Anesthesia Preprocedure Evaluation (Addendum)
Anesthesia Evaluation  Patient identified by MRN, date of birth, ID band Patient awake    Reviewed: Allergy & Precautions, H&P , NPO status , Patient's Chart, lab work & pertinent test results, reviewed documented beta blocker date and time   History of Anesthesia Complications (+) PONV  Airway Mallampati: III  TM Distance: >3 FB Neck ROM: Full    Dental no notable dental hx. (+) Teeth Intact, Dental Advisory Given   Pulmonary neg pulmonary ROS, former smoker,    Pulmonary exam normal breath sounds clear to auscultation       Cardiovascular hypertension, Pt. on medications and Pt. on home beta blockers + CAD and +CHF  + dysrhythmias Atrial Fibrillation + Cardiac Defibrillator  Rhythm:Regular Rate:Normal     Neuro/Psych Anxiety Depression negative neurological ROS     GI/Hepatic negative GI ROS, Neg liver ROS,   Endo/Other  diabetes, Insulin DependentMorbid obesity  Renal/GU Renal InsufficiencyRenal disease  negative genitourinary   Musculoskeletal  (+) Arthritis , Osteoarthritis,    Abdominal   Peds  Hematology negative hematology ROS (+)   Anesthesia Other Findings   Reproductive/Obstetrics negative OB ROS                            Anesthesia Physical Anesthesia Plan  ASA: III  Anesthesia Plan: MAC   Post-op Pain Management:    Induction: Intravenous  PONV Risk Score and Plan: 3 and Propofol infusion and Treatment may vary due to age or medical condition  Airway Management Planned: Nasal Cannula  Additional Equipment:   Intra-op Plan:   Post-operative Plan:   Informed Consent: I have reviewed the patients History and Physical, chart, labs and discussed the procedure including the risks, benefits and alternatives for the proposed anesthesia with the patient or authorized representative who has indicated his/her understanding and acceptance.     Dental advisory  given  Plan Discussed with: CRNA  Anesthesia Plan Comments:        Anesthesia Quick Evaluation

## 2019-09-20 NOTE — CV Procedure (Signed)
    PROCEDURE NOTE:  Procedure:  Transesophageal echocardiogram Operator:  Fransico Him, MD Indications:  Atrial Fibrillation Complications: None  During this procedure the patient is administered a total of Propofol 306 mg to achieve and maintain moderate conscious sedation.  The patient's heart rate, blood pressure, and oxygen saturation are monitored continuously during the procedure.   Results: Severely dilated LV with severe LV dysfunction with EF 25-30% Mildly dilated RV with mild RV dysfunction.  Pacer wire present.  Mildly dilated RA with pacer wire present. Severely dilated LA with intermittent spontaneous echo contrast.  The LA appendage appears to have a loosely formed thrombus in the body of the appendage. Normal TV with moderate to severe TR Poorly visualized PV Normal MV with moderate to severe mitral regurgitation likely related to annular dilatation.  The MR wraps around the LA.  There does not appear to be flow into the pulmonary vein on color but appears to be intermittent Pulmonary vein flow reversal but PW doppler.  Trileaflet AV with mildly thickened leaflets. Hypermobile interatrial septum with PFO noted both on colorflow and microcavitation saline study Mild to moderate atherosclerosis of the thoracic and ascending aorta.  Cardioversion not performed due to early forming thrombus in LA appendage.  The patient tolerated the procedure well and was transferred back to their room in stable condition.   Signed: Fransico Him, MD [facility]

## 2019-09-21 ENCOUNTER — Inpatient Hospital Stay: Payer: Self-pay

## 2019-09-21 LAB — BASIC METABOLIC PANEL
Anion gap: 14 (ref 5–15)
BUN: 79 mg/dL — ABNORMAL HIGH (ref 8–23)
CO2: 26 mmol/L (ref 22–32)
Calcium: 9 mg/dL (ref 8.9–10.3)
Chloride: 85 mmol/L — ABNORMAL LOW (ref 98–111)
Creatinine, Ser: 2.4 mg/dL — ABNORMAL HIGH (ref 0.44–1.00)
GFR calc Af Amer: 23 mL/min — ABNORMAL LOW (ref >60)
GFR calc non Af Amer: 20 mL/min — ABNORMAL LOW (ref >60)
Glucose, Bld: 130 mg/dL — ABNORMAL HIGH (ref 70–99)
Potassium: 4.5 mmol/L (ref 3.5–5.1)
Sodium: 125 mmol/L — ABNORMAL LOW (ref 135–145)

## 2019-09-21 LAB — GLUCOSE, CAPILLARY
Glucose-Capillary: 133 mg/dL — ABNORMAL HIGH (ref 70–99)
Glucose-Capillary: 200 mg/dL — ABNORMAL HIGH (ref 70–99)
Glucose-Capillary: 129 mg/dL — ABNORMAL HIGH (ref 70–99)
Glucose-Capillary: 133 mg/dL — ABNORMAL HIGH (ref 70–99)

## 2019-09-21 LAB — COOXEMETRY PANEL
Carboxyhemoglobin: 1.3 % (ref 0.5–1.5)
Methemoglobin: 0.5 % (ref 0.0–1.5)
O2 Saturation: 75.8 %
Total hemoglobin: 13.7 g/dL (ref 12.0–16.0)

## 2019-09-21 LAB — MAGNESIUM: Magnesium: 3 mg/dL — ABNORMAL HIGH (ref 1.7–2.4)

## 2019-09-21 MED ORDER — METOLAZONE 5 MG PO TABS
2.5000 mg | ORAL_TABLET | Freq: Once | ORAL | Status: AC
  Administered 2019-09-21: 2.5 mg via ORAL
  Filled 2019-09-21: qty 1

## 2019-09-21 MED ORDER — BISACODYL 5 MG PO TBEC
5.0000 mg | DELAYED_RELEASE_TABLET | Freq: Every day | ORAL | Status: DC | PRN
  Administered 2019-09-21 – 2019-09-23 (×2): 5 mg via ORAL
  Filled 2019-09-21 (×2): qty 1

## 2019-09-21 NOTE — Progress Notes (Signed)
Received order for PICC  

## 2019-09-21 NOTE — Progress Notes (Signed)
Subjective:  At least 950 of UOP weight up but sodium better -  BP seems OK-  mostly over 100-  weak Objective Vital signs in last 24 hours: Vitals:   09/20/19 2038 09/20/19 2103 09/21/19 0623 09/21/19 0849  BP: (!) 129/108 97/62 103/70 106/78  Pulse: 80  90 84  Resp:   19 19  Temp: (!) 97.5 F (36.4 C)  98.1 F (36.7 C) 98 F (36.7 C)  TempSrc: Oral  Oral Oral  SpO2: 96%  93% 97%  Weight:   120.4 kg   Height:       Weight change:   Intake/Output Summary (Last 24 hours) at 09/21/2019 1015 Last data filed at 09/20/2019 2321 Gross per 24 hour  Intake 720 ml  Output 950 ml  Net -230 ml    Assessment/ Plan: Pt is a 69 y.o. yo female with HTN, CHF and Afib who was admitted on 09/16/2019 with Afib with RVR and volume overload-  Baseline crt around 1-  Has developed AKI  Assessment/Plan: 1. AKI-  U/A is bland-  Seeming to be hemodynamic in cause.  Hopefully since we have seen some more consistent hemodynamics will start to see renal function plateau and improve- maybe better today.  The fact that her renal function is normal at baseline is a good thing.  No indication for dialysis today 2. HTN/vol-  Volume overloaded with soft BP-- HR seems stable on cardizem drip as well as max dose coreg.  In the interest of achieving more kidney perfusion I  decreased the coreg (it was taken back up) still on cardizem 3. Hyponatremia-  Seeming like hypervolemic hyponatremia - urine osm is appropriately low.  Should be treated with loop diuretic.  Likes torsemide, have resumed.  She normally takes 100 of torsemide at home daily with occasional metolazone-  and also 50 of aldactone.  Have resumed the home dose of torsemide-  UOP not robust but sodium a little better- weight up.  Will try a little metolazone today  4.  Afib-  On cardizem dip-  With pacer-  Seems like rate has been OK of late-  Per cards  5,  Hypokalemia-  Giving 40 of k dur daily  Magnesium high     Erasmo Vertz A Nester Bachus    Labs: Basic  Metabolic Panel: Recent Labs  Lab 09/19/19 0441 09/20/19 0004 09/21/19 0259  NA 123* 123* 125*  K 3.4* 4.4 4.5  CL 86* 83* 85*  CO2 23 26 26   GLUCOSE 197* 180* 130*  BUN 75* 78* 79*  CREATININE 2.35* 2.63* 2.40*  CALCIUM 8.8* 9.1 9.0   Liver Function Tests: Recent Labs  Lab 09/20/19 0004  AST 70*  ALT 157*  ALKPHOS 64  BILITOT 1.1  PROT 5.8*  ALBUMIN 3.4*   No results for input(s): LIPASE, AMYLASE in the last 168 hours. No results for input(s): AMMONIA in the last 168 hours. CBC: Recent Labs  Lab 09/16/19 2349 09/17/19 0359 09/18/19 0516  WBC 11.8* 11.0* 9.1  HGB 13.9 13.1 12.7  HCT 43.9 40.8 38.9  MCV 94.0 93.8 91.1  PLT 286 258 269   Cardiac Enzymes: No results for input(s): CKTOTAL, CKMB, CKMBINDEX, TROPONINI in the last 168 hours. CBG: Recent Labs  Lab 09/20/19 0602 09/20/19 0946 09/20/19 1713 09/20/19 2058 09/21/19 0817  GLUCAP 167* 186* 234* 248* 133*    Iron Studies: No results for input(s): IRON, TIBC, TRANSFERRIN, FERRITIN in the last 72 hours. Studies/Results: ECHO TEE  Result Date: 09/20/2019  TRANSESOPHOGEAL ECHO REPORT   Patient Name:   Sara Walker Date of Exam: 09/20/2019 Medical Rec #:  [id number]      Height:       68.0 in Accession #:    [id number]     Weight:       263.4 lb Date of Birth:  1951/04/16      BSA:          2.297 m Patient Age:    73 years       BP:           110/67 mmHg Patient Gender: F              HR:           68 bpm. Exam Location:  Inpatient Procedure: Transesophageal Echo, Cardiac Doppler, Color Doppler and Saline            Contrast Bubble Study Indications:     Atrial Fibrillation  History:         Patient has prior history of Echocardiogram examinations, most                  recent 09/19/2019. Cardiomyopathy and CHF, Arrythmias:Atrial                  Fibrillation and LBBB, Signs/Symptoms:Syncope; Risk                  Factors:Hypertension, Dyslipidemia and Former Smoker. VT.  Sonographer:     Vickie Epley RDCS  Referring Phys:  [id number] [place] NELSON Diagnosing Phys: Fransico Him MD PROCEDURE: The transesophogeal probe was passed without difficulty through the esophogus of the patient. Sedation performed by different physician. The patient was monitored while under deep sedation. Anesthestetic sedation was provided intravenously by Anesthesiology: 285.89mg  of Propofol. The patient developed no complications during the procedure. IMPRESSIONS  1. Left ventricular ejection fraction, by estimation, is 25 to 30%. The left ventricle has severely decreased function. The left ventricle demonstrates global hypokinesis. The left ventricular internal cavity size was severely dilated.  2. Right ventricular systolic function is mildly reduced. The right ventricular size is mildly enlarged.  3. There was a loosely formed thrombus in the body of the LA appendage. Left atrial size was severely dilated.  4. Right atrial size was mildly dilated.  5. Tricuspid valve regurgitation is moderate to severe.  6. The mitral valve is normal in structure. Moderate to severe mitral valve regurgitation likley related to MV leaflet teathering and annular dilatation. THe MR wraps around the LA but does not appear to enter the pulmonary vein by colorflow doppler but  there is intermittent pulmonary vein flow reversal with PW doppler.  7. The aortic valve is tricuspid. Aortic valve regurgitation is not visualized. Mild aortic valve sclerosis is present, with no evidence of aortic valve stenosis.  8. Evidence of atrial level shunting detected by color flow Doppler. Agitated saline contrast bubble study was positive with shunting observed within 3-6 cardiac cycles suggestive of interatrial shunt. There is a small patent foramen ovale with bidirectional shunting across atrial septum.  9. Mild to moderate aortic atherosclerosis is present in the ascending and descending aorta. aorta. FINDINGS  Left Ventricle: Left ventricular ejection fraction, by  estimation, is 25 to 30%. The left ventricle has severely decreased function. The left ventricle demonstrates global hypokinesis. The left ventricular internal cavity size was severely dilated. There is no left ventricular hypertrophy. Right Ventricle: The right ventricular size is mildly enlarged. No increase  in right ventricular wall thickness. Right ventricular systolic function is mildly reduced. Left Atrium: There was a loosely formed thrombus in the body of the LA appendage. Left atrial size was severely dilated. Spontaneous echo contrast was present in the left atrial appendage and left atrium. A left atrial/left atrial appendage thrombus was detected. Right Atrium: Right atrial size was mildly dilated. Pericardium: There is no evidence of pericardial effusion. Mitral Valve: The mitral valve is normal in structure. Moderate to severe mitral valve regurgitation. Tricuspid Valve: The tricuspid valve is normal in structure. Tricuspid valve regurgitation is moderate to severe. Aortic Valve: The aortic valve is tricuspid. . There is mild thickening of the aortic valve. Aortic valve regurgitation is not visualized. Mild aortic valve sclerosis is present, with no evidence of aortic valve stenosis. There is mild thickening of the aortic valve. Pulmonic Valve: The pulmonic valve was not well visualized. Pulmonic valve regurgitation is not visualized. Aorta: Mild to moderate aortic atherosclerosis is present in the ascending and descending aorta. Mild to moderate aortic atherosclerosis is present in the ascending and descending aorta. IAS/Shunts: There is redundancy of the interatrial septum. Evidence of atrial level shunting detected by color flow Doppler. Agitated saline contrast was given intravenously to evaluate for intracardiac shunting. Agitated saline contrast bubble study was  positive with shunting observed within 3-6 cardiac cycles suggestive of interatrial shunt. A small patent foramen ovale is detected  with bidirectional shunting across atrial septum. Additional Comments: A pacer wire is visualized.  MR Peak grad:    84.3 mmHg MR Mean grad:    48.0 mmHg MR Vmax:         459.00 cm/s MR Vmean:        321.0 cm/s MR PISA:         1.57 cm MR PISA Eff ROA: 12 mm MR PISA Radius:  0.50 cm Fransico Him MD Electronically signed by Fransico Him MD Signature Date/Time: 09/20/2019/5:24:20 PM    Final    ECHOCARDIOGRAM LIMITED  Result Date: 09/19/2019    ECHOCARDIOGRAM LIMITED REPORT   Patient Name:   Sara Walker Date of Exam: 09/19/2019 Medical Rec #:  [id number]      Height:       68.0 in Accession #:    [id number]     Weight:       263.4 lb Date of Birth:  Nov 17, 1950      BSA:          2.297 m Patient Age:    82 years       BP:           108/65 mmHg Patient Gender: F              HR:           70 bpm. Exam Location:  Inpatient Procedure: Limited Echo and Intracardiac Opacification Agent Indications:    Reason for exam-Echo Cardiomyopathy-Unspecified 425.9 / I42.9  History:        Patient has prior history of Echocardiogram examinations, most                 recent 09/17/2019. CHF and Cardiomyopathy, Biventricular ICD                 (implantable cardioverter-defibrillator) in place,                 Arrythmias:Atrial Fibrillation and LBBB; Risk                 Factors:Hypertension, Dyslipidemia,  Diabetes and Obesity.  Sonographer:    Vikki Ports Turrentine Referring Phys: [id number] [place]  Sonographer Comments: Image acquisition challenging due to patient body habitus. IMPRESSIONS  1. Left ventricular ejection fraction, by estimation, is 25 to 30%. The left ventricle has severely decreased function. The left ventricle demonstrates global hypokinesis. The left ventricular internal cavity size was severely dilated. There is mild concentric left ventricular hypertrophy. Left ventricular diastolic parameters are indeterminate.  2. Mild mitral valve regurgitation.  3. The aortic valve is tricuspid.  4. There is moderately  elevated pulmonary artery systolic pressure.  5. The inferior vena cava is dilated in size with <50% respiratory variability, suggesting right atrial pressure of 15 mmHg. FINDINGS  Left Ventricle: Global hypokinesis with akinesis of the inferoseptal and inferior myocardium. Left ventricular ejection fraction, by estimation, is 25 to 30%. The left ventricle has severely decreased function. The left ventricle demonstrates global hypokinesis. Definity contrast agent was given IV to delineate the left ventricular endocardial borders. The left ventricular internal cavity size was severely dilated. There is mild concentric left ventricular hypertrophy. Right Ventricle: There is moderately elevated pulmonary artery systolic pressure. The tricuspid regurgitant velocity is 2.75 m/s, and with an assumed right atrial pressure of 15 mmHg, the estimated right ventricular systolic pressure is 27.0 mmHg. Pericardium: Trivial pericardial effusion is present. Mitral Valve: Mild mitral valve regurgitation. Tricuspid Valve: Tricuspid valve regurgitation is mild. Aortic Valve: The aortic valve is tricuspid. Pulmonic Valve: Pulmonic valve regurgitation is trivial. Venous: The inferior vena cava is dilated in size with less than 50% respiratory variability, suggesting right atrial pressure of 15 mmHg. Additional Comments: A pacer wire is visualized.  LEFT VENTRICLE PLAX 2D LVIDd:         7.10 cm LVIDs:         6.22 cm LV PW:         1.04 cm LV IVS:        1.10 cm  MITRAL VALVE                TRICUSPID VALVE MV Area (PHT): 4.60 cm     TR Peak grad:   30.2 mmHg MV Decel Time: 165 msec     TR Vmax:        275.00 cm/s MV E velocity: 117.00 cm/s MV A velocity: 42.20 cm/s MV E/A ratio:  2.77 Skeet Latch MD Electronically signed by Skeet Latch MD Signature Date/Time: 09/19/2019/3:36:30 PM    Final    Medications: Infusions:   Scheduled Medications: . allopurinol  450 mg Oral Daily  . apixaban  5 mg Oral BID  . carvedilol  25  mg Oral BID  . insulin aspart  0-5 Units Subcutaneous QHS  . insulin aspart  0-9 Units Subcutaneous TID WC  . insulin aspart  6 Units Subcutaneous TID WC  . insulin glargine  22 Units Subcutaneous BID  . Melatonin  6 mg Oral Once  . potassium chloride  40 mEq Oral Daily  . sodium chloride flush  3 mL Intravenous Q12H  . torsemide  50 mg Oral BID    have reviewed scheduled and prn medications.  Physical Exam: General: in bed depressed they could not do cardioversion  Heart: paced RRR Lungs: mostly clear Abdomen: obese Extremities: pitting edema    09/21/2019,10:15 AM  LOS: 4 days

## 2019-09-21 NOTE — Progress Notes (Signed)
Secure chat with Dr Moshe Cipro states ok to place PICC line.

## 2019-09-21 NOTE — Progress Notes (Signed)
Pt will need central line in order to draw coox.  Midline cannot be used to draw coox.  Cardiology made aware.  Idolina Primer, RN

## 2019-09-21 NOTE — Progress Notes (Signed)
Spoke with Dr Caryl Comes.  States he will notify Dr Meda Coffee that pt hx of bil mastectomy and left ICD, unable for VAS Team to place PICC.  Yoko RN notified, pt aware upon this RN leaving the room.

## 2019-09-21 NOTE — Progress Notes (Signed)
[facility] PROGRESS NOTE    Progress Note  Sara Walker  SPQ:[id number] DOB: 30-May-1951 DOA: 09/16/2019 PCP: Deland Pretty, MD     Brief Narrative:   Sara Walker is an 69 y.o. female past medical history of breast cancer treated with Adriamycin and radiation, chronic combined systolic and diastolic heart failure with an EF of 20%, insulin-dependent diabetes mellitus type 2, with a history of polymorphic VT status post AICD presents to the emergency room with shortness of breath chest discomfort and A. fib with RVR.  She received her first dose of SARS-CoV-2 on 09/09/2019 20 after she started developing nausea generalized weakness shortness of breath chest discomfort and palpitation.  In the ED she was found to be afebrile satting greater than 90%, her blood pressure was in the 100s, EKG showed A. fib with RVR with a heart rate of 125, chest x-ray showed pulmonary edema her sodium was 128, creatinine 1.8.  Assessment/Plan:   New onset Atrial fibrillation with RVR (HCC) She was started on IV diltiazem her chads VASC2 is greater than 4, was previously on anticoagulation but she was hesitant to take it as an outpatient, due to her recent vaginal bleed. Her TTE showed very poor ejection fraction of 15% with severe septum and lateral wall dyssynchrony and paradoxical septal movement. She underwent TEE when her heart rate was controlled which showed a thrombus in the left atrial appendage severely dilated left ventricular function and an EF of 25% mildly dilated RV and right ventricular dysfunction, severely dilated left atrium.  And severe tricuspid regurgitation with severe mitral regurgitation likely related to her annular dilation. Patient is now agreeable to anticoagulation she is currently on Eliquis. Cardiology will attempt a TEE cardioversion in 4 weeks and reevaluate her degree of mitral regurgitation once in sinus rhythm. He is a poor candidate for open heart surgery, he might  benefit from MitraClip. He has DCD IV Cardizem and now on Coreg 12.5 p.o. twice daily.  Acute on chronic combined systolic and diastolic heart failure/with a history of nonischemic cardiomyopathy secondary to Adriamycin: With a BNP greater than 1000, chest x-ray with bilateral pulmonary infiltrates, with positive JVD and lower extremity edema. She was started on IV diuresis, 2D echo showed lateral and septal disynchrony and paradoxical septal motion. EF of 25% with global hypokinesia, right ventricle systolic function is mildly reduced with biatrial enlargement.  TEE showed a left atrial thrombus in the appendage cardioversion cannot be performed. She remains fluid overloaded on physical exam.  Acute kidney injury: With a baseline creatinine of less than 1, on admission 1.8.  She was started on IV Lasix and her creatinine has continued to trend up.  Renal recommended to restart her home dose of torsemide, hemodynamically stable and her renal function has improved today.  Continue diuresis per renal.  Hypervolemic hyponatremia: She was started on torsemide as her hemodynamics have improved (she is not in RVR) her sodium is slowly improving.  Insulin-dependent diabetes mellitus type 2/hyperglycemia: Her insulin pump was discontinued. She was started on long-acting insulin per sliding scale her blood we have been titrating her long-acting insulin her blood glucose this morning is improved, will continue current regimen plus sliding scale.  VT: In 2016 she was noted to be having torsades in the setting of major electrolyte abnormalities and heart failure exacerbation. Monitoring in telemetry with no events.  Hyperlipidemia: Continue Repatha.  Hypokalemia: We will give her single dose orally. Check a magnesium level.  Constipation: She relates her last  bowel movement was 2 days ago and it was very hard we will start her on MiraLAX.  DVT prophylaxis: heparin Family  Communication:none Disposition Plan/Barrier to D/C: Patient can be discharged once her heart rate and her creatinine has improved.  Code Status:     Code Status Orders  (From admission, onward)         Start     Ordered   09/17/19 0333  Full code  Continuous     09/17/19 0335        Code Status History    Date Active Date Inactive Code Status Order ID Comments User Context   04/04/2019 0636 04/04/2019 1149 Full Code [id number]  Everlene Farrier, MD Inpatient   02/23/2018 1448 02/23/2018 1904 Full Code [id number]  Sara Klein, MD Inpatient   08/28/2013 1846 08/30/2013 1413 Full Code [id number]  Geradine Girt, DO Inpatient   Advance Care Planning Activity    Advance Directive Documentation     Most Recent Value  Type of Advance Directive  Healthcare Power of [place], Living will  Pre-existing out of facility DNR order (yellow form or pink MOST form)  --  "MOST" Form in Place?  --        IV Access:    Peripheral IV   Procedures and diagnostic studies:   ECHO TEE  Result Date: 09/20/2019    TRANSESOPHOGEAL ECHO REPORT   Patient Name:   LAPORSCHA Walker Date of Exam: 09/20/2019 Medical Rec #:  [id number]      Height:       68.0 in Accession #:    [id number]     Weight:       263.4 lb Date of Birth:  09-Mar-1951      BSA:          2.297 m Patient Age:    69 years       BP:           110/67 mmHg Patient Gender: F              HR:           68 bpm. Exam Location:  Inpatient Procedure: Transesophageal Echo, Cardiac Doppler, Color Doppler and Saline            Contrast Bubble Study Indications:     Atrial Fibrillation  History:         Patient has prior history of Echocardiogram examinations, most                  recent 09/19/2019. Cardiomyopathy and CHF, Arrythmias:Atrial                  Fibrillation and LBBB, Signs/Symptoms:Syncope; Risk                  Factors:Hypertension, Dyslipidemia and Former Smoker. VT.  Sonographer:     Vickie Epley RDCS Referring Phys:  [id number] Jamse [place] NELSON  Diagnosing Phys: Fransico Him MD PROCEDURE: The transesophogeal probe was passed without difficulty through the esophogus of the patient. Sedation performed by different physician. The patient was monitored while under deep sedation. Anesthestetic sedation was provided intravenously by Anesthesiology: 285.89mg  of Propofol. The patient developed no complications during the procedure. IMPRESSIONS  1. Left ventricular ejection fraction, by estimation, is 25 to 30%. The left ventricle has severely decreased function. The left ventricle demonstrates global hypokinesis. The left ventricular internal cavity size was severely dilated.  2. Right ventricular systolic function is mildly reduced. The right ventricular  size is mildly enlarged.  3. There was a loosely formed thrombus in the body of the LA appendage. Left atrial size was severely dilated.  4. Right atrial size was mildly dilated.  5. Tricuspid valve regurgitation is moderate to severe.  6. The mitral valve is normal in structure. Moderate to severe mitral valve regurgitation likley related to MV leaflet teathering and annular dilatation. THe MR wraps around the LA but does not appear to enter the pulmonary vein by colorflow doppler but  there is intermittent pulmonary vein flow reversal with PW doppler.  7. The aortic valve is tricuspid. Aortic valve regurgitation is not visualized. Mild aortic valve sclerosis is present, with no evidence of aortic valve stenosis.  8. Evidence of atrial level shunting detected by color flow Doppler. Agitated saline contrast bubble study was positive with shunting observed within 3-6 cardiac cycles suggestive of interatrial shunt. There is a small patent foramen ovale with bidirectional shunting across atrial septum.  9. Mild to moderate aortic atherosclerosis is present in the ascending and descending aorta. aorta. FINDINGS  Left Ventricle: Left ventricular ejection fraction, by estimation, is 25 to 30%. The left ventricle has  severely decreased function. The left ventricle demonstrates global hypokinesis. The left ventricular internal cavity size was severely dilated. There is no left ventricular hypertrophy. Right Ventricle: The right ventricular size is mildly enlarged. No increase in right ventricular wall thickness. Right ventricular systolic function is mildly reduced. Left Atrium: There was a loosely formed thrombus in the body of the LA appendage. Left atrial size was severely dilated. Spontaneous echo contrast was present in the left atrial appendage and left atrium. A left atrial/left atrial appendage thrombus was detected. Right Atrium: Right atrial size was mildly dilated. Pericardium: There is no evidence of pericardial effusion. Mitral Valve: The mitral valve is normal in structure. Moderate to severe mitral valve regurgitation. Tricuspid Valve: The tricuspid valve is normal in structure. Tricuspid valve regurgitation is moderate to severe. Aortic Valve: The aortic valve is tricuspid. . There is mild thickening of the aortic valve. Aortic valve regurgitation is not visualized. Mild aortic valve sclerosis is present, with no evidence of aortic valve stenosis. There is mild thickening of the aortic valve. Pulmonic Valve: The pulmonic valve was not well visualized. Pulmonic valve regurgitation is not visualized. Aorta: Mild to moderate aortic atherosclerosis is present in the ascending and descending aorta. Mild to moderate aortic atherosclerosis is present in the ascending and descending aorta. IAS/Shunts: There is redundancy of the interatrial septum. Evidence of atrial level shunting detected by color flow Doppler. Agitated saline contrast was given intravenously to evaluate for intracardiac shunting. Agitated saline contrast bubble study was  positive with shunting observed within 3-6 cardiac cycles suggestive of interatrial shunt. A small patent foramen ovale is detected with bidirectional shunting across atrial septum.  Additional Comments: A pacer wire is visualized.  MR Peak grad:    84.3 mmHg MR Mean grad:    48.0 mmHg MR Vmax:         459.00 cm/s MR Vmean:        321.0 cm/s MR PISA:         1.57 cm MR PISA Eff ROA: 12 mm MR PISA Radius:  0.50 cm Fransico Him MD Electronically signed by Fransico Him MD Signature Date/Time: 09/20/2019/5:24:20 PM    Final    ECHOCARDIOGRAM LIMITED  Result Date: 09/19/2019    ECHOCARDIOGRAM LIMITED REPORT   Patient Name:   EMERALD SHOR Date of Exam: 09/19/2019 Medical  Rec #:  [id number]      Height:       68.0 in Accession #:    [id number]     Weight:       263.4 lb Date of Birth:  06/27/1951      BSA:          2.297 m Patient Age:    80 years       BP:           108/65 mmHg Patient Gender: F              HR:           70 bpm. Exam Location:  Inpatient Procedure: Limited Echo and Intracardiac Opacification Agent Indications:    Reason for exam-Echo Cardiomyopathy-Unspecified 425.9 / I42.9  History:        Patient has prior history of Echocardiogram examinations, most                 recent 09/17/2019. CHF and Cardiomyopathy, Biventricular ICD                 (implantable cardioverter-defibrillator) in place,                 Arrythmias:Atrial Fibrillation and LBBB; Risk                 Factors:Hypertension, Dyslipidemia, Diabetes and Obesity.  Sonographer:    Vikki Ports Turrentine Referring Phys: [id number] [place]  Sonographer Comments: Image acquisition challenging due to patient body habitus. IMPRESSIONS  1. Left ventricular ejection fraction, by estimation, is 25 to 30%. The left ventricle has severely decreased function. The left ventricle demonstrates global hypokinesis. The left ventricular internal cavity size was severely dilated. There is mild concentric left ventricular hypertrophy. Left ventricular diastolic parameters are indeterminate.  2. Mild mitral valve regurgitation.  3. The aortic valve is tricuspid.  4. There is moderately elevated pulmonary artery systolic pressure.  5.  The inferior vena cava is dilated in size with <50% respiratory variability, suggesting right atrial pressure of 15 mmHg. FINDINGS  Left Ventricle: Global hypokinesis with akinesis of the inferoseptal and inferior myocardium. Left ventricular ejection fraction, by estimation, is 25 to 30%. The left ventricle has severely decreased function. The left ventricle demonstrates global hypokinesis. Definity contrast agent was given IV to delineate the left ventricular endocardial borders. The left ventricular internal cavity size was severely dilated. There is mild concentric left ventricular hypertrophy. Right Ventricle: There is moderately elevated pulmonary artery systolic pressure. The tricuspid regurgitant velocity is 2.75 m/s, and with an assumed right atrial pressure of 15 mmHg, the estimated right ventricular systolic pressure is 17.6 mmHg. Pericardium: Trivial pericardial effusion is present. Mitral Valve: Mild mitral valve regurgitation. Tricuspid Valve: Tricuspid valve regurgitation is mild. Aortic Valve: The aortic valve is tricuspid. Pulmonic Valve: Pulmonic valve regurgitation is trivial. Venous: The inferior vena cava is dilated in size with less than 50% respiratory variability, suggesting right atrial pressure of 15 mmHg. Additional Comments: A pacer wire is visualized.  LEFT VENTRICLE PLAX 2D LVIDd:         7.10 cm LVIDs:         6.22 cm LV PW:         1.04 cm LV IVS:        1.10 cm  MITRAL VALVE                TRICUSPID VALVE MV Area (PHT): 4.60 cm     TR Peak  grad:   30.2 mmHg MV Decel Time: 165 msec     TR Vmax:        275.00 cm/s MV E velocity: 117.00 cm/s MV A velocity: 42.20 cm/s MV E/A ratio:  2.77 Skeet Latch MD Electronically signed by Skeet Latch MD Signature Date/Time: 09/19/2019/3:36:30 PM    Final      Medical Consultants:    None.  Anti-Infectives:   None  Subjective:    KADINCE BOXLEY is complaining of shortness of breath feels tired and fatigued.  Objective:     Vitals:   09/20/19 2038 09/20/19 2103 09/21/19 0623 09/21/19 0849  BP: (!) 129/108 97/62 103/70 106/78  Pulse: 80  90 84  Resp:   19 19  Temp: (!) 97.5 F (36.4 C)  98.1 F (36.7 C) 98 F (36.7 C)  TempSrc: Oral  Oral Oral  SpO2: 96%  93% 97%  Weight:   120.4 kg   Height:       SpO2: 97 % O2 Flow Rate (L/min): 2 L/min   Intake/Output Summary (Last 24 hours) at 09/21/2019 0943 Last data filed at 09/20/2019 2321 Gross per 24 hour  Intake 720 ml  Output 950 ml  Net -230 ml   Filed Weights   09/19/19 0409 09/20/19 0730 09/21/19 0623  Weight: 119.5 kg 119.5 kg 120.4 kg    Exam: General exam: In no acute distress. Respiratory system: Good air movement and clear to auscultation. Cardiovascular system: S1 & S2 heard, RRR. No JVD. Gastrointestinal system: Abdomen is nondistended, soft and nontender.  Extremities: 3+ edema Skin: No rashes, lesions or ulcers Psychiatry: Judgement and insight appear normal. Mood & affect appropriate.   Data Reviewed:    Labs: Basic Metabolic Panel: Recent Labs  Lab 09/17/19 0359 09/17/19 0359 09/18/19 0516 09/18/19 0516 09/19/19 0441 09/19/19 0441 09/19/19 1013 09/20/19 0004 09/21/19 0259  NA 128*  --  125*  --  123*  --   --  123* 125*  K 4.0   < > 3.6   < > 3.4*   < >  --  4.4 4.5  CL 87*  --  86*  --  86*  --   --  83* 85*  CO2 22  --  22  --  23  --   --  26 26  GLUCOSE 113*  --  295*  --  197*  --   --  180* 130*  BUN 52*  --  67*  --  75*  --   --  78* 79*  CREATININE 1.88*  --  2.11*  --  2.35*  --   --  2.63* 2.40*  CALCIUM 9.6  --  9.2  --  8.8*  --   --  9.1 9.0  MG 2.3  --   --   --   --   --  2.7* 3.0* 3.0*   < > = values in this interval not displayed.   GFR Estimated Creatinine Clearance: 30.6 mL/min (A) (by C-G formula based on SCr of 2.4 mg/dL (H)). Liver Function Tests: Recent Labs  Lab 09/20/19 0004  AST 70*  ALT 157*  ALKPHOS 64  BILITOT 1.1  PROT 5.8*  ALBUMIN 3.4*   No results for input(s):  LIPASE, AMYLASE in the last 168 hours. No results for input(s): AMMONIA in the last 168 hours. Coagulation profile Recent Labs  Lab 09/19/19 1601  INR 1.4*   COVID-19 Labs  No results for input(s): DDIMER, FERRITIN, LDH,  CRP in the last 72 hours.  Lab Results  Component Value Date   SARSCOV2NAA NEGATIVE 09/17/2019   [place] NEGATIVE 04/02/2019    CBC: Recent Labs  Lab 09/16/19 2349 09/17/19 0359 09/18/19 0516  WBC 11.8* 11.0* 9.1  HGB 13.9 13.1 12.7  HCT 43.9 40.8 38.9  MCV 94.0 93.8 91.1  PLT 286 258 269   Cardiac Enzymes: No results for input(s): CKTOTAL, CKMB, CKMBINDEX, TROPONINI in the last 168 hours. BNP (last 3 results) No results for input(s): PROBNP in the last 8760 hours. CBG: Recent Labs  Lab 09/20/19 0602 09/20/19 0946 09/20/19 1713 09/20/19 2058 09/21/19 0817  GLUCAP 167* 186* 234* 248* 133*   D-Dimer: No results for input(s): DDIMER in the last 72 hours. Hgb A1c: No results for input(s): HGBA1C in the last 72 hours. Lipid Profile: No results for input(s): CHOL, HDL, LDLCALC, TRIG, CHOLHDL, LDLDIRECT in the last 72 hours. Thyroid function studies: No results for input(s): TSH, T4TOTAL, T3FREE, THYROIDAB in the last 72 hours.  Invalid input(s): FREET3 Anemia work up: No results for input(s): VITAMINB12, FOLATE, FERRITIN, TIBC, IRON, RETICCTPCT in the last 72 hours. Sepsis Labs: Recent Labs  Lab 09/16/19 2349 09/17/19 0359 09/18/19 0516  WBC 11.8* 11.0* 9.1   Microbiology Recent Results (from the past 240 hour(s))  SARS CORONAVIRUS 2 (TAT 6-24 HRS) Nasopharyngeal Nasopharyngeal Swab     Status: None   Collection Time: 09/17/19 12:04 AM   Specimen: Nasopharyngeal Swab  Result Value Ref Range Status   SARS Coronavirus 2 NEGATIVE NEGATIVE Final    Comment: (NOTE) SARS-CoV-2 target nucleic acids are NOT DETECTED. The SARS-CoV-2 RNA is generally detectable in upper and lower respiratory specimens during the acute phase of infection.  Negative results do not preclude SARS-CoV-2 infection, do not rule out co-infections with other pathogens, and should not be used as the sole basis for treatment or other patient management decisions. Negative results must be combined with clinical observations, patient history, and epidemiological information. The expected result is Negative. Fact Sheet for Patients: SugarRoll.be Fact Sheet for Healthcare Providers: https://www.woods-mathews.com/ This test is not yet approved or cleared by the [place] FDA and  has been authorized for detection and/or diagnosis of SARS-CoV-2 by FDA under an Emergency Use Authorization (EUA). This EUA will remain  in effect (meaning this test can be used) for the duration of the COVID-19 declaration under Section 56 4(b)(1) of the Act, 21 U.S.C. section 360bbb-3(b)(1), unless the authorization is terminated or revoked sooner. Performed at [facility], [place] [address] [place], [place] [address]      Medications:    allopurinol  450 mg Oral Daily   apixaban  5 mg Oral BID   carvedilol  25 mg Oral BID   insulin aspart  0-5 Units Subcutaneous QHS   insulin aspart  0-9 Units Subcutaneous TID WC   insulin aspart  6 Units Subcutaneous TID WC   insulin glargine  22 Units Subcutaneous BID   Melatonin  6 mg Oral Once   potassium chloride  40 mEq Oral Daily   sodium chloride flush  3 mL Intravenous Q12H   torsemide  50 mg Oral BID   Continuous Infusions:     LOS: 4 days   Charlynne Cousins  [facility]  09/21/2019, 9:43 AM

## 2019-09-21 NOTE — Progress Notes (Signed)
Progress Note  Patient Name: Sara Walker Date of Encounter: 09/21/2019  Primary Cardiologist: Sanda Klein, MD   Subjective   She is not feeling well, she is profoundly tired, with distended abdomen and SOB.   Inpatient Medications    Scheduled Meds: . allopurinol  450 mg Oral Daily  . apixaban  5 mg Oral BID  . carvedilol  25 mg Oral BID  . insulin aspart  0-5 Units Subcutaneous QHS  . insulin aspart  0-9 Units Subcutaneous TID WC  . insulin aspart  6 Units Subcutaneous TID WC  . insulin glargine  22 Units Subcutaneous BID  . Melatonin  6 mg Oral Once  . metolazone  2.5 mg Oral Once  . potassium chloride  40 mEq Oral Daily  . sodium chloride flush  3 mL Intravenous Q12H  . torsemide  50 mg Oral BID   Continuous Infusions:  PRN Meds: acetaminophen **OR** acetaminophen, alum & mag hydroxide-simeth, HYDROcodone-acetaminophen, ondansetron **OR** ondansetron (ZOFRAN) IV, zolpidem   Vital Signs    Vitals:   09/20/19 2038 09/20/19 2103 09/21/19 0623 09/21/19 0849  BP: (!) 129/108 97/62 103/70 106/78  Pulse: 80  90 84  Resp:   19 19  Temp: (!) 97.5 F (36.4 C)  98.1 F (36.7 C) 98 F (36.7 C)  TempSrc: Oral  Oral Oral  SpO2: 96%  93% 97%  Weight:   120.4 kg   Height:        Intake/Output Summary (Last 24 hours) at 09/21/2019 1033 Last data filed at 09/20/2019 2321 Gross per 24 hour  Intake 720 ml  Output 950 ml  Net -230 ml   Last 3 Weights 09/21/2019 09/20/2019 09/19/2019  Weight (lbs) 265 lb 6.4 oz 263 lb 6.4 oz 263 lb 6.4 oz  Weight (kg) 120.385 kg 119.477 kg 119.477 kg      Telemetry    Atrial fibrillation, ventricular pacing- Personally Reviewed  ECG    No new tracing- Personally Reviewed  Physical Exam   GEN:  Morbidly obese Neck: No JVD Cardiac: iRRR, no murmurs, rubs, or gallops.  Respiratory:  Minimal crackles bilaterally. GI: Soft, nontender, morbidly obese and distended  MS:  1+ bilateral edema; No deformity. Neuro:  Nonfocal    Psych: Normal affect   Labs    High Sensitivity Troponin:   Recent Labs  Lab 09/16/19 2349 09/17/19 0149  TROPONINIHS 39* 38*      Chemistry Recent Labs  Lab 09/19/19 0441 09/20/19 0004 09/21/19 0259  NA 123* 123* 125*  K 3.4* 4.4 4.5  CL 86* 83* 85*  CO2 23 26 26   GLUCOSE 197* 180* 130*  BUN 75* 78* 79*  CREATININE 2.35* 2.63* 2.40*  CALCIUM 8.8* 9.1 9.0  PROT  --  5.8*  --   ALBUMIN  --  3.4*  --   AST  --  70*  --   ALT  --  157*  --   ALKPHOS  --  64  --   BILITOT  --  1.1  --   GFRNONAA 21* 18* 20*  GFRAA 24* 21* 23*  ANIONGAP 14 14 14      Hematology Recent Labs  Lab 09/16/19 2349 09/17/19 0359 09/18/19 0516  WBC 11.8* 11.0* 9.1  RBC 4.67 4.35 4.27  HGB 13.9 13.1 12.7  HCT 43.9 40.8 38.9  MCV 94.0 93.8 91.1  MCH 29.8 30.1 29.7  MCHC 31.7 32.1 32.6  RDW 14.7 14.6 14.7  PLT 286 258 269  BNP Recent Labs  Lab 09/16/19 2349  BNP 1,100.9*     DDimer No results for input(s): DDIMER in the last 168 hours.   Radiology    DG Chest Portable 1 View  Result Date: 09/17/2019 CLINICAL DATA:  Shortness of breath EXAM: PORTABLE CHEST 1 VIEW COMPARISON:  08/28/2013 FINDINGS: There is a multi lead left-sided pacemaker in place. The heart size is enlarged. There is developing pulmonary edema with small bilateral pleural effusions. There is no pneumothorax. There is significant shift of the trachea to the patient's left which is likely secondary to the patient's known enlarged thyroid gland. IMPRESSION: Findings consistent with congestive heart failure. Electronically Signed   By: Constance Holster M.D.   On: 09/17/2019 00:13   TTE: 09/17/2019   1. There is profound septal-lateral dyssynchrony, consistent with loss of  resynchronization pacing. Left ventricular ejection fraction, by  estimation, is 25 to 30%. The left ventricle has severely decreased  function. The left ventricle demonstrates  global hypokinesis. The left ventricular internal cavity size  was severely  dilated. Left ventricular diastolic function could not be evaluated. Left  ventricular diastolic function could not be evaluated.  2. Right ventricular systolic function is mildly reduced. The right  ventricular size is mildly enlarged. There is moderately elevated  pulmonary artery systolic pressure.  3. Left atrial size was severely dilated.  4. The mitral valve is grossly normal. Moderate to severe mitral valve  regurgitation.  5. Tricuspid valve regurgitation is mild to moderate.  6. The aortic valve is normal in structure. Aortic valve regurgitation is  not visualized.  7. The inferior vena cava is dilated in size with <50% respiratory  variability, suggesting right atrial pressure of 15 mmHg.    Patient Profile     69 y.o. 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 Caryl Comes and generator change in August 2019 [place] by Dr. Sallyanne Kuster, history of paroxysmal A. fib and polymorphic VT requiring ICD shocks (the setting of hypokalemia), LBBB, chronic renal insufficiency stage III, hyperlipidemia, remote breast cancer.  Diabetes with insulin pump who is being seen today for the evaluation of atrial fibrillation and heart failure. Se is intolerant to multiple medications including ACE inhibitor, arm, and Entresto.  She is intolerant to statins and refusing to take anticoagulation secondary to prior history of vaginal bleeding that was addressed by [facility] and placement of IUD last year. She was last seen by Dr. Lequita Halt December 2020 when defibrillator test was performed and device function was normal.  Reported that she always had a relatively high LV lead pacing threshold.  She has not had recurrent VT VF therapy since 2016. Dr. Loletha Grayer was notified on September 13, 2019 that she is in persistent atrial fibrillation, the patient states that she received her first dose of COVID vaccine 8  days ago.  She has noticed palpitations last few days and worsening lower extremity edema, shortness of breath, paroxysmal nocturnal dyspnea and orthopnea, she basically has to sit up at night in order to breathe.    Assessment & Plan    Acute cute combined systolic and diastolic heart failure Cardiomyopathy S/P CRT-D A. Fib AKI Hypertension VT Hyperlipidemia  Her echocardiogram shows very poor LVEF 10 to 15% with severe septum to lateral wall dyssynchrony and paradoxical septal motion, however this was obtained while the patient was in A. fib with RVR and noncapturing, QRS appears widened compared to baseline  but again acquired while it was not capturing.  Today's EKG shows atrial fibrillation,  ventricular paced rhythm with excellent capture at 70 bpm and narrow QRS complexes down 134 ms.    She underwent TEE this am with findings of a thrombus in the LAA, Severely dilated LV with severe LV dysfunction with EF 25-30%, Mildly dilated RV with mild RV dysfunction.  Severely dilated LA with intermittent spontaneous echo contrast.  The LA appendage appears to have a loosely formed thrombus in the body of the appendage. Normal TV with moderate to severe TR, moderate to severe mitral regurgitation likely related to annular dilatation.   The patient is now agreeable to anticoagulation, she was started on Eliquis 5 mg po BID, case management and pharmacy gave her free 1 month supply, they also applied for an assistance program.She was previously on Xarelto however experience vaginal bleeding that was treated by [facility] and polypectomy and placement of IUD, however she also had significant gastric reflux and is refusing to take it, Coumadin would not be an ideal drug for her.    We will plan for an early follow up after the discharge, we will arrange for another TEE/cardioversion in 4 weeks, we will then re-evaluate degree of mitral regurgitation once she is in SR. She would be a poor candidate for open  heart surgery, but might be a candidate for Mitraclip.  For HR control, I will d/c iv Cardizem and increase carvedilol 12.5 mg po BID.   Her kidney function is deteriorating, this is most probably related to low EF and a-fib with RVR. Nephrology follow, they restarted her torsemide at 50 mg PO BID. Baseline Crea 0.92, Now 1.8->2.11->2.35->2.63-->, improved today, nephrology added metolazone 2.5 mg po daily today.  I will start a PICC line and order COOX to seeif we need to add inotropes/milrinone.   We will place consult for cardiac rehab, she is quite deconditioned.   For questions or updates, please contact [place] Please consult [email or web address] for contact info under    Signed, Ena Dawley, MD  09/21/2019, 10:33 AM

## 2019-09-22 ENCOUNTER — Encounter: Payer: Self-pay | Admitting: *Deleted

## 2019-09-22 DIAGNOSIS — I071 Rheumatic tricuspid insufficiency: Secondary | ICD-10-CM

## 2019-09-22 DIAGNOSIS — I340 Nonrheumatic mitral (valve) insufficiency: Secondary | ICD-10-CM

## 2019-09-22 LAB — RENAL FUNCTION PANEL
Albumin: 3.3 g/dL — ABNORMAL LOW (ref 3.5–5.0)
Anion gap: 14 (ref 5–15)
BUN: 76 mg/dL — ABNORMAL HIGH (ref 8–23)
CO2: 27 mmol/L (ref 22–32)
Calcium: 9 mg/dL (ref 8.9–10.3)
Chloride: 84 mmol/L — ABNORMAL LOW (ref 98–111)
Creatinine, Ser: 2.01 mg/dL — ABNORMAL HIGH (ref 0.44–1.00)
GFR calc Af Amer: 29 mL/min — ABNORMAL LOW (ref >60)
GFR calc non Af Amer: 25 mL/min — ABNORMAL LOW (ref >60)
Glucose, Bld: 144 mg/dL — ABNORMAL HIGH (ref 70–99)
Phosphorus: 3.9 mg/dL (ref 2.5–4.6)
Potassium: 3.7 mmol/L (ref 3.5–5.1)
Sodium: 125 mmol/L — ABNORMAL LOW (ref 135–145)

## 2019-09-22 LAB — GLUCOSE, CAPILLARY
Glucose-Capillary: 131 mg/dL — ABNORMAL HIGH (ref 70–99)
Glucose-Capillary: 136 mg/dL — ABNORMAL HIGH (ref 70–99)
Glucose-Capillary: 207 mg/dL — ABNORMAL HIGH (ref 70–99)
Glucose-Capillary: 170 mg/dL — ABNORMAL HIGH (ref 70–99)

## 2019-09-22 LAB — MAGNESIUM: Magnesium: 2.5 mg/dL — ABNORMAL HIGH (ref 1.7–2.4)

## 2019-09-22 NOTE — Progress Notes (Signed)
Patient c/o feeling like she is floating and extreme fatigue.  States she does not feel she can get up to [facility]   BP 89/58, HR 80s.  Dr. Aileen Fass notified.  Order to recheck BP manually and hold tonight's demadex dose if SBP less than 100, manual recheck BP 94/50, demadex held.

## 2019-09-22 NOTE — Progress Notes (Signed)
Subjective:  2550 UOP - negative 1500.   BP seems OK, weight down and sodium stable-  BP's mostly over 100-  Weak  Objective Vital signs in last 24 hours: Vitals:   09/21/19 1512 09/21/19 2152 09/22/19 0629 09/22/19 0837  BP: 109/80 112/67 102/69 110/72  Pulse: 84  75 91  Resp:   19   Temp: 97.8 F (36.6 C)  97.8 F (36.6 C)   TempSrc: Oral  Oral   SpO2: 96%  97%   Weight:   118.6 kg   Height:       Weight change: -0.907 kg  Intake/Output Summary (Last 24 hours) at 09/22/2019 1012 Last data filed at 09/22/2019 0940 Gross per 24 hour  Intake 966 ml  Output 2550 ml  Net -1584 ml    Assessment/ Plan: Pt is a 69 y.o. yo female with HTN, CHF and Afib who was admitted on 09/16/2019 with Afib with RVR and volume overload-  Baseline crt around 1-  Has developed AKI  Assessment/Plan: 1. AKI-  U/A is bland-  Seeming to be hemodynamic in cause.  Hopefully since we have seen some more consistent hemodynamics will start to see renal function plateau and improve-  better again today, crt 2.4 to 2.0.  The fact that her renal function is normal at baseline is a good thing.   2. HTN/vol-  Volume overloaded with soft BP-- HR seems stable on max dose coreg- off cardizem.   3. Hyponatremia-  Seeming like hypervolemic hyponatremia - urine osm is appropriately low.  Should be treated with loop diuretic.  Likes torsemide, have resumed.  She normally takes 100 of torsemide at home daily with occasional metolazone-  and also 50 of aldactone.  Have resumed the home dose of torsemide-  Gave 2.5 of metolazone 3/13 with good results.  Will not redose today -  Hope that with crt improved she may autodiurese some.  Cont the torsemide. Has a lot of fluid on board   4.  Afib-  On cardizem dip-  With pacer-  Seems like rate has been OK of late-  Per cards.  Now off cardizem  5,  Hypokalemia-  Giving 40 of k dur daily- OK to continue.    Magnesium high     Louis Meckel    Labs: Basic Metabolic  Panel: Recent Labs  Lab 09/20/19 0004 09/21/19 0259 09/22/19 0434  NA 123* 125* 125*  K 4.4 4.5 3.7  CL 83* 85* 84*  CO2 26 26 27   GLUCOSE 180* 130* 144*  BUN 78* 79* 76*  CREATININE 2.63* 2.40* 2.01*  CALCIUM 9.1 9.0 9.0  PHOS  --   --  3.9   Liver Function Tests: Recent Labs  Lab 09/20/19 0004 09/22/19 0434  AST 70*  --   ALT 157*  --   ALKPHOS 64  --   BILITOT 1.1  --   PROT 5.8*  --   ALBUMIN 3.4* 3.3*   No results for input(s): LIPASE, AMYLASE in the last 168 hours. No results for input(s): AMMONIA in the last 168 hours. CBC: Recent Labs  Lab 09/16/19 2349 09/17/19 0359 09/18/19 0516  WBC 11.8* 11.0* 9.1  HGB 13.9 13.1 12.7  HCT 43.9 40.8 38.9  MCV 94.0 93.8 91.1  PLT 286 258 269   Cardiac Enzymes: No results for input(s): CKTOTAL, CKMB, CKMBINDEX, TROPONINI in the last 168 hours. CBG: Recent Labs  Lab 09/21/19 0817 09/21/19 1121 09/21/19 1724 09/21/19 2120 09/22/19 0751  GLUCAP  133* 200* 129* 133* 131*    Iron Studies: No results for input(s): IRON, TIBC, TRANSFERRIN, FERRITIN in the last 72 hours. Studies/Results: [place] EKG SITE RITE  Result Date: 09/21/2019 If Site Rite image not attached, placement could not be confirmed due to current cardiac rhythm.  Medications: Infusions:   Scheduled Medications: . allopurinol  450 mg Oral Daily  . apixaban  5 mg Oral BID  . carvedilol  25 mg Oral BID  . insulin aspart  0-5 Units Subcutaneous QHS  . insulin aspart  0-9 Units Subcutaneous TID WC  . insulin aspart  6 Units Subcutaneous TID WC  . insulin glargine  22 Units Subcutaneous BID  . Melatonin  6 mg Oral Once  . potassium chloride  40 mEq Oral Daily  . sodium chloride flush  3 mL Intravenous Q12H  . torsemide  50 mg Oral BID    have reviewed scheduled and prn medications.  Physical Exam: General: sitting up-  weak Heart: paced RRR Lungs: mostly clear Abdomen: obese Extremities: pitting edema    09/22/2019,10:12 AM  LOS: 5 days

## 2019-09-22 NOTE — Progress Notes (Signed)
ANTICOAGULATION CONSULT NOTE - Follow Up Consult  Pharmacy Consult for Eliquis Indication: atrial fibrillation  Allergies  Allergen Reactions  . Lasix [Furosemide] Other (See Comments)    Causes gout  . Ace Inhibitors Cough  . Codeine Other (See Comments)    Crazy  . Digitalis Other (See Comments)    "saw yellow circles"  . Levaquin [Levofloxacin In D5w] Other (See Comments)    Gout   . Other Other (See Comments)    Nitro spray: reaction unknown  . Statins Other (See Comments)    Reaction unknown  . Xarelto [Rivaroxaban]   . Zyrtec [Cetirizine] Other (See Comments)    Slept for a day then was up for 4 days afterwards    Patient Measurements: Height: 5\' 8"  (172.7 cm) Weight: 261 lb 6.4 oz (118.6 kg) IBW/kg (Calculated) : 63.9 Heparin Dosing Weight:    Vital Signs: Temp: 97.8 F (36.6 C) (03/14 0629) Temp Source: Oral (03/14 0629) BP: 102/69 (03/14 0629) Pulse Rate: 75 (03/14 0629)  Labs: Recent Labs    09/19/19 1601 09/20/19 0004 09/21/19 0259 09/22/19 0434  LABPROT 17.4*  --   --   --   INR 1.4*  --   --   --   CREATININE  --  2.63* 2.40* 2.01*    Estimated Creatinine Clearance: 36.3 mL/min (A) (by C-G formula based on SCr of 2.01 mg/dL (H)).  Assessment: Anticoag: new apix for persistent afib. Early forming thrombus in LA appendage and PFO mentioned in echo. DCCV cancelled due to thrombus. Pt prefers not to take coumadin and cards also concerned for pt compliance. Scr 2 down. Hgb 12.7 stable. Plts WNL  Goal of Therapy:  Therapeutic oral anticoagulation   Plan:  Apix 5mg  po bid   Tabias Swayze S. Alford Highland, PharmD, BCPS Clinical Staff Pharmacist [email or web address] Alford Highland, Aamya Orellana Stillinger 09/22/2019,7:42 AM

## 2019-09-22 NOTE — Progress Notes (Addendum)
Progress Note  Patient Name: Sara Walker Date of Encounter: 09/22/2019  Primary Cardiologist: Sanda Klein, MD   Subjective   Fatigued, but breathing better  Inpatient Medications    Scheduled Meds: . allopurinol  450 mg Oral Daily  . apixaban  5 mg Oral BID  . carvedilol  25 mg Oral BID  . insulin aspart  0-5 Units Subcutaneous QHS  . insulin aspart  0-9 Units Subcutaneous TID WC  . insulin aspart  6 Units Subcutaneous TID WC  . insulin glargine  22 Units Subcutaneous BID  . Melatonin  6 mg Oral Once  . potassium chloride  40 mEq Oral Daily  . sodium chloride flush  3 mL Intravenous Q12H  . torsemide  50 mg Oral BID   Continuous Infusions:  PRN Meds: acetaminophen **OR** acetaminophen, alum & mag hydroxide-simeth, bisacodyl, HYDROcodone-acetaminophen, ondansetron **OR** ondansetron (ZOFRAN) IV, zolpidem   Vital Signs    Vitals:   09/21/19 0849 09/21/19 1512 09/21/19 2152 09/22/19 0629  BP: 106/78 109/80 112/67 102/69  Pulse: 84 84  75  Resp: 19   19  Temp: 98 F (36.7 C) 97.8 F (36.6 C)  97.8 F (36.6 C)  TempSrc: Oral Oral  Oral  SpO2: 97% 96%  97%  Weight:    118.6 kg  Height:        Intake/Output Summary (Last 24 hours) at 09/22/2019 0803 Last data filed at 09/22/2019 0643 Gross per 24 hour  Intake 843 ml  Output 2550 ml  Net -1707 ml   Last 3 Weights 09/22/2019 09/21/2019 09/20/2019  Weight (lbs) 261 lb 6.4 oz 265 lb 6.4 oz 263 lb 6.4 oz  Weight (kg) 118.57 kg 120.385 kg 119.477 kg      Telemetry    Atrial fibrillation, rate controlled.  - Personally Reviewed  ECG    Atrial fibrillation with LBBB - Personally Reviewed  Physical Exam   GEN: No acute distress.   Neck: No JVD Cardiac: irregularly irregular, no murmurs, rubs, or gallops.  Respiratory: Clear to auscultation bilaterally. GI: Soft, nontender, non-distended  MS: 2-3+ edema; No deformity. Neuro:  Nonfocal  Psych: Normal affect   Labs    High Sensitivity Troponin:     Recent Labs  Lab 09/16/19 2349 09/17/19 0149  TROPONINIHS 39* 38*      Chemistry Recent Labs  Lab 09/20/19 0004 09/21/19 0259 09/22/19 0434  NA 123* 125* 125*  K 4.4 4.5 3.7  CL 83* 85* 84*  CO2 26 26 27   GLUCOSE 180* 130* 144*  BUN 78* 79* 76*  CREATININE 2.63* 2.40* 2.01*  CALCIUM 9.1 9.0 9.0  PROT 5.8*  --   --   ALBUMIN 3.4*  --  3.3*  AST 70*  --   --   ALT 157*  --   --   ALKPHOS 64  --   --   BILITOT 1.1  --   --   GFRNONAA 18* 20* 25*  GFRAA 21* 23* 29*  ANIONGAP 14 14 14      Hematology Recent Labs  Lab 09/16/19 2349 09/17/19 0359 09/18/19 0516  WBC 11.8* 11.0* 9.1  RBC 4.67 4.35 4.27  HGB 13.9 13.1 12.7  HCT 43.9 40.8 38.9  MCV 94.0 93.8 91.1  MCH 29.8 30.1 29.7  MCHC 31.7 32.1 32.6  RDW 14.7 14.6 14.7  PLT 286 258 269    BNP Recent Labs  Lab 09/16/19 2349  BNP 1,100.9*     DDimer No results for input(s): DDIMER  in the last 168 hours.   Radiology    ECHO TEE  Result Date: 09/20/2019    TRANSESOPHOGEAL ECHO REPORT   Patient Name:   Sara Walker Date of Exam: 09/20/2019 Medical Rec #:  [id number]      Height:       68.0 in Accession #:    [id number]     Weight:       263.4 lb Date of Birth:  1951-03-03      BSA:          2.297 m Patient Age:    51 years       BP:           110/67 mmHg Patient Gender: F              HR:           68 bpm. Exam Location:  Inpatient Procedure: Transesophageal Echo, Cardiac Doppler, Color Doppler and Saline            Contrast Bubble Study Indications:     Atrial Fibrillation  History:         Patient has prior history of Echocardiogram examinations, most                  recent 09/19/2019. Cardiomyopathy and CHF, Arrythmias:Atrial                  Fibrillation and LBBB, Signs/Symptoms:Syncope; Risk                  Factors:Hypertension, Dyslipidemia and Former Smoker. VT.  Sonographer:     Vickie Epley RDCS Referring Phys:  [id number] Jamse [place] Kierstin January Diagnosing Phys: Fransico Him MD PROCEDURE: The transesophogeal probe  was passed without difficulty through the esophogus of the patient. Sedation performed by different physician. The patient was monitored while under deep sedation. Anesthestetic sedation was provided intravenously by Anesthesiology: 285.89mg  of Propofol. The patient developed no complications during the procedure. IMPRESSIONS  1. Left ventricular ejection fraction, by estimation, is 25 to 30%. The left ventricle has severely decreased function. The left ventricle demonstrates global hypokinesis. The left ventricular internal cavity size was severely dilated.  2. Right ventricular systolic function is mildly reduced. The right ventricular size is mildly enlarged.  3. There was a loosely formed thrombus in the body of the LA appendage. Left atrial size was severely dilated.  4. Right atrial size was mildly dilated.  5. Tricuspid valve regurgitation is moderate to severe.  6. The mitral valve is normal in structure. Moderate to severe mitral valve regurgitation likley related to MV leaflet teathering and annular dilatation. THe MR wraps around the LA but does not appear to enter the pulmonary vein by colorflow doppler but  there is intermittent pulmonary vein flow reversal with PW doppler.  7. The aortic valve is tricuspid. Aortic valve regurgitation is not visualized. Mild aortic valve sclerosis is present, with no evidence of aortic valve stenosis.  8. Evidence of atrial level shunting detected by color flow Doppler. Agitated saline contrast bubble study was positive with shunting observed within 3-6 cardiac cycles suggestive of interatrial shunt. There is a small patent foramen ovale with bidirectional shunting across atrial septum.  9. Mild to moderate aortic atherosclerosis is present in the ascending and descending aorta. aorta. FINDINGS  Left Ventricle: Left ventricular ejection fraction, by estimation, is 25 to 30%. The left ventricle has severely decreased function. The left ventricle demonstrates global  hypokinesis. The left ventricular internal cavity size  was severely dilated. There is no left ventricular hypertrophy. Right Ventricle: The right ventricular size is mildly enlarged. No increase in right ventricular wall thickness. Right ventricular systolic function is mildly reduced. Left Atrium: There was a loosely formed thrombus in the body of the LA appendage. Left atrial size was severely dilated. Spontaneous echo contrast was present in the left atrial appendage and left atrium. A left atrial/left atrial appendage thrombus was detected. Right Atrium: Right atrial size was mildly dilated. Pericardium: There is no evidence of pericardial effusion. Mitral Valve: The mitral valve is normal in structure. Moderate to severe mitral valve regurgitation. Tricuspid Valve: The tricuspid valve is normal in structure. Tricuspid valve regurgitation is moderate to severe. Aortic Valve: The aortic valve is tricuspid. . There is mild thickening of the aortic valve. Aortic valve regurgitation is not visualized. Mild aortic valve sclerosis is present, with no evidence of aortic valve stenosis. There is mild thickening of the aortic valve. Pulmonic Valve: The pulmonic valve was not well visualized. Pulmonic valve regurgitation is not visualized. Aorta: Mild to moderate aortic atherosclerosis is present in the ascending and descending aorta. Mild to moderate aortic atherosclerosis is present in the ascending and descending aorta. IAS/Shunts: There is redundancy of the interatrial septum. Evidence of atrial level shunting detected by color flow Doppler. Agitated saline contrast was given intravenously to evaluate for intracardiac shunting. Agitated saline contrast bubble study was  positive with shunting observed within 3-6 cardiac cycles suggestive of interatrial shunt. A small patent foramen ovale is detected with bidirectional shunting across atrial septum. Additional Comments: A pacer wire is visualized.  MR Peak grad:     84.3 mmHg MR Mean grad:    48.0 mmHg MR Vmax:         459.00 cm/s MR Vmean:        321.0 cm/s MR PISA:         1.57 cm MR PISA Eff ROA: 12 mm MR PISA Radius:  0.50 cm Fransico Him MD Electronically signed by Fransico Him MD Signature Date/Time: 09/20/2019/5:24:20 PM    Final    [place] EKG SITE RITE  Result Date: 09/21/2019 If Site Rite image not attached, placement could not be confirmed due to current cardiac rhythm.   Cardiac Studies   TEE 09/20/2019 IMPRESSIONS    1. Left ventricular ejection fraction, by estimation, is 25 to 30%. The  left ventricle has severely decreased function. The left ventricle  demonstrates global hypokinesis. The left ventricular internal cavity size  was severely dilated.  2. Right ventricular systolic function is mildly reduced. The right  ventricular size is mildly enlarged.  3. There was a loosely formed thrombus in the body of the LA appendage.  Left atrial size was severely dilated.  4. Right atrial size was mildly dilated.  5. Tricuspid valve regurgitation is moderate to severe.  6. The mitral valve is normal in structure. Moderate to severe mitral  valve regurgitation likley related to MV leaflet teathering and annular  dilatation. THe MR wraps around the LA but does not appear to enter the  pulmonary vein by colorflow doppler but  there is intermittent pulmonary vein flow reversal with PW doppler.  7. The aortic valve is tricuspid. Aortic valve regurgitation is not  visualized. Mild aortic valve sclerosis is present, with no evidence of  aortic valve stenosis.  8. Evidence of atrial level shunting detected by color flow Doppler.  Agitated saline contrast bubble study was positive with shunting observed  within 3-6 cardiac cycles  suggestive of interatrial shunt. There is a  small patent foramen ovale with  bidirectional shunting across atrial septum.  9. Mild to moderate aortic atherosclerosis is present in the ascending  and descending  aorta. aorta.   Patient Profile     69 y.o. female with PMH of severe NICM, combined systolic and diastolic CHF s/p CRT-D in Aug 2019, PAF, polymorphic VT requiring ICD shocks in the setting of hypokalemia, LBBB, CKD stage III, HLD, IDDM and breast CA presented with atrial fibrillation and CHF. She is intolerant of ACEI, ARB and Entresto, also intolerant of statins and refuse anticoagulation due to vaginal bleeding.   Assessment & Plan    1. Acute on chronic combined systolic and diastolic heart failure  - Echo 09/17/2019 showed EF 25-30%, moderate to severe MR  - TEE 09/20/2019 showed EF 25-30%, loosely formed thrombus in the body of LAA, moderate to severe MR likely related to MV leaflet teathering and annular dilatation, moderate to severe TR, small patent foramen ovale with bidirectional shunting  - unable to place PICC line yesterday due to h/o mastectomy and L ICD, however she is responding to metolazone and renal function is improving, I/O -2.6 Liters, not sure if necessary have central line at this point esp since diuresis and picked up and renal function improving suggest pressor is probably not needed at this time. Would continue diuresis. Consider add another dose of 2.5mg  metolazone today  2. Persistent atrial fibrillation: previous refused anticoagulation, however agreeable to try Eliquis during this admission  - TEE demonstrated LAA thrombus. Plan for TEE DCCV in 4 weeks  3. NICM s/p CRT-D  4. Moderate to severe MR: seen on echo, felt to be related to annular dilatation. Reassess once she is sinus rhythm. May be a candidate for mitraclip if MR still severe  5. HTN: stable on coreg.  6. HLD  7. H/o VT: no significant VT episode on telemetry  8. AKI: in the setting of afib with RVR and low EF  For questions or updates, please contact [place] Please consult [email or web address] for contact info under    Signed, Almyra Deforest, [place]  09/22/2019, 8:03 AM     The patient was seen,  examined and discussed with Almyra Deforest, PA-C and I agree with the above.   The patient is slowly improving, her kidney function is now improving second day in a row, creatinine down from 2.4-2.0, she is finally diuresing -1.7 L overnight, she remains in atrial fibrillation however her heart rates are much better controlled in 70s and 80s, she has intermittent ventricular pacing on telemetry.  She is severely deconditioned and fatigue, physical therapy has started to work with her.  We will continue current medical management.  Ena Dawley, MD 09/22/2019

## 2019-09-22 NOTE — Progress Notes (Signed)
[facility] PROGRESS NOTE    Progress Note  Sara Walker  JIR:[id number] DOB: Nov 30, 1950 DOA: 09/16/2019 PCP: Deland Pretty, MD     Brief Narrative:   Sara Walker is an 69 y.o. female past medical history of breast cancer treated with Adriamycin and radiation, chronic combined systolic and diastolic heart failure with an EF of 20%, insulin-dependent diabetes mellitus type 2, with a history of polymorphic VT status post AICD presents to the emergency room with shortness of breath chest discomfort and A. fib with RVR.  She received her first dose of SARS-CoV-2 on 09/09/2019 20 after she started developing nausea generalized weakness shortness of breath chest discomfort and palpitation.  In the ED she was found to be afebrile satting greater than 90%, her blood pressure was in the 100s, EKG showed A. fib with RVR with a heart rate of 125, chest x-ray showed pulmonary edema her sodium was 128, creatinine 1.8.  Assessment/Plan:   New onset Atrial fibrillation with RVR (HCC) She was started on IV diltiazem her chads VASC2 is greater than 4, was previously on anticoagulation but she was hesitant to take it as an outpatient, due to her recent vaginal bleed. Her TTE showed very poor ejection fraction of 15% with severe septum and lateral wall dyssynchrony and paradoxical septal movement. She underwent TEE when her heart rate was controlled which showed a thrombus in the left atrial appendage severely dilated left ventricular function and an EF of 25% mildly dilated RV and right ventricular dysfunction, severely dilated left atrium.  And severe tricuspid regurgitation with severe mitral regurgitation likely related to her annular dilation. Patient is now agreeable to anticoagulation she is currently on Eliquis. Cardiology will attempt a TEE cardioversion in 4 weeks and reevaluate her degree of mitral regurgitation once in sinus rhythm. PICC line was inserted on 09/21/2019, which shows a co-ox of  75. He is a poor candidate for open heart surgery, he might benefit from MitraClip. He has DCD IV Cardizem and now on Coreg 12.5 p.o. twice daily.  Acute on chronic combined systolic and diastolic heart failure/with a history of nonischemic cardiomyopathy secondary to Adriamycin: With a BNP greater than 1000, chest x-ray with bilateral pulmonary infiltrates, with positive JVD and lower extremity edema. She was started on IV diuresis, 2D echo showed lateral and septal disynchrony and paradoxical septal motion. EF of 25% with global hypokinesia, right ventricle systolic function is mildly reduced with biatrial enlargement.  TEE showed a left atrial thrombus in the appendage cardioversion cannot be performed. She continues to be fluid overloaded on physical exam, nephrology added 2.5 of metolazone and and her diuresis is improved.  Acute kidney injury: With a baseline creatinine of less than 1, on admission 1.8.  She was started on IV Lasix and her creatinine has continued to trend up.  Once her hemodynamic specially her heart rate improved. Renal recommended to restart her home dose of torsemide and metolazone and her creatinine continues to improve, her urine output has significantly picked up with oral metolazone and torsemide.  Hypervolemic hyponatremia: She was started on torsemide and metolazone as her hemodynamics have improved (she is not in RVR) her sodium has remained flat at 125.  Insulin-dependent diabetes mellitus type 2/hyperglycemia: Her insulin pump was discontinued. She was started on long-acting insulin per sliding scale her blood we have been titrating her long-acting insulin her blood glucose this morning is improved, will continue current regimen plus sliding scale.  VT: In 2016 she was noted to  be having torsades in the setting of major electrolyte abnormalities and heart failure exacerbation. Monitoring in telemetry with no events.  Hyperlipidemia: Continue  Repatha.  Hypokalemia: We will give her single dose orally. Check a magnesium level.  Constipation: She has had several BM , now resolved.  DVT prophylaxis: heparin Family Communication:none Disposition Plan/Barrier to D/C: Patient can be discharged once her heart rate and her creatinine has improved.  Code Status:     Code Status Orders  (From admission, onward)         Start     Ordered   09/17/19 0333  Full code  Continuous     09/17/19 0335        Code Status History    Date Active Date Inactive Code Status Order ID Comments User Context   04/04/2019 0636 04/04/2019 1149 Full Code [id number]  Everlene Farrier, MD Inpatient   02/23/2018 1448 02/23/2018 1904 Full Code [id number]  Sanda Klein, MD Inpatient   08/28/2013 1846 08/30/2013 1413 Full Code [id number]  Geradine Girt, DO Inpatient   Advance Care Planning Activity    Advance Directive Documentation     Most Recent Value  Type of Advance Directive  Healthcare Power of [place], Living will  Pre-existing out of facility DNR order (yellow form or pink MOST form)  --  "MOST" Form in Place?  --        IV Access:    Peripheral IV   Procedures and diagnostic studies:   ECHO TEE  Result Date: 09/20/2019    TRANSESOPHOGEAL ECHO REPORT   Patient Name:   Sara Walker Date of Exam: 09/20/2019 Medical Rec #:  [id number]      Height:       68.0 in Accession #:    [id number]     Weight:       263.4 lb Date of Birth:  July 21, 1950      BSA:          2.297 m Patient Age:    89 years       BP:           110/67 mmHg Patient Gender: F              HR:           68 bpm. Exam Location:  Inpatient Procedure: Transesophageal Echo, Cardiac Doppler, Color Doppler and Saline            Contrast Bubble Study Indications:     Atrial Fibrillation  History:         Patient has prior history of Echocardiogram examinations, most                  recent 09/19/2019. Cardiomyopathy and CHF, Arrythmias:Atrial                  Fibrillation and LBBB,  Signs/Symptoms:Syncope; Risk                  Factors:Hypertension, Dyslipidemia and Former Smoker. VT.  Sonographer:     Vickie Epley RDCS Referring Phys:  [id number] Jamse [place] NELSON Diagnosing Phys: Fransico Him MD PROCEDURE: The transesophogeal probe was passed without difficulty through the esophogus of the patient. Sedation performed by different physician. The patient was monitored while under deep sedation. Anesthestetic sedation was provided intravenously by Anesthesiology: 285.89mg  of Propofol. The patient developed no complications during the procedure. IMPRESSIONS  1. Left ventricular ejection fraction, by estimation, is 25 to 30%. The left ventricle has  severely decreased function. The left ventricle demonstrates global hypokinesis. The left ventricular internal cavity size was severely dilated.  2. Right ventricular systolic function is mildly reduced. The right ventricular size is mildly enlarged.  3. There was a loosely formed thrombus in the body of the LA appendage. Left atrial size was severely dilated.  4. Right atrial size was mildly dilated.  5. Tricuspid valve regurgitation is moderate to severe.  6. The mitral valve is normal in structure. Moderate to severe mitral valve regurgitation likley related to MV leaflet teathering and annular dilatation. THe MR wraps around the LA but does not appear to enter the pulmonary vein by colorflow doppler but  there is intermittent pulmonary vein flow reversal with PW doppler.  7. The aortic valve is tricuspid. Aortic valve regurgitation is not visualized. Mild aortic valve sclerosis is present, with no evidence of aortic valve stenosis.  8. Evidence of atrial level shunting detected by color flow Doppler. Agitated saline contrast bubble study was positive with shunting observed within 3-6 cardiac cycles suggestive of interatrial shunt. There is a small patent foramen ovale with bidirectional shunting across atrial septum.  9. Mild to moderate aortic  atherosclerosis is present in the ascending and descending aorta. aorta. FINDINGS  Left Ventricle: Left ventricular ejection fraction, by estimation, is 25 to 30%. The left ventricle has severely decreased function. The left ventricle demonstrates global hypokinesis. The left ventricular internal cavity size was severely dilated. There is no left ventricular hypertrophy. Right Ventricle: The right ventricular size is mildly enlarged. No increase in right ventricular wall thickness. Right ventricular systolic function is mildly reduced. Left Atrium: There was a loosely formed thrombus in the body of the LA appendage. Left atrial size was severely dilated. Spontaneous echo contrast was present in the left atrial appendage and left atrium. A left atrial/left atrial appendage thrombus was detected. Right Atrium: Right atrial size was mildly dilated. Pericardium: There is no evidence of pericardial effusion. Mitral Valve: The mitral valve is normal in structure. Moderate to severe mitral valve regurgitation. Tricuspid Valve: The tricuspid valve is normal in structure. Tricuspid valve regurgitation is moderate to severe. Aortic Valve: The aortic valve is tricuspid. . There is mild thickening of the aortic valve. Aortic valve regurgitation is not visualized. Mild aortic valve sclerosis is present, with no evidence of aortic valve stenosis. There is mild thickening of the aortic valve. Pulmonic Valve: The pulmonic valve was not well visualized. Pulmonic valve regurgitation is not visualized. Aorta: Mild to moderate aortic atherosclerosis is present in the ascending and descending aorta. Mild to moderate aortic atherosclerosis is present in the ascending and descending aorta. IAS/Shunts: There is redundancy of the interatrial septum. Evidence of atrial level shunting detected by color flow Doppler. Agitated saline contrast was given intravenously to evaluate for intracardiac shunting. Agitated saline contrast bubble study  was  positive with shunting observed within 3-6 cardiac cycles suggestive of interatrial shunt. A small patent foramen ovale is detected with bidirectional shunting across atrial septum. Additional Comments: A pacer wire is visualized.  MR Peak grad:    84.3 mmHg MR Mean grad:    48.0 mmHg MR Vmax:         459.00 cm/s MR Vmean:        321.0 cm/s MR PISA:         1.57 cm MR PISA Eff ROA: 12 mm MR PISA Radius:  0.50 cm Fransico Him MD Electronically signed by Fransico Him MD Signature Date/Time: 09/20/2019/5:24:20 PM  Final    [place] EKG SITE RITE  Result Date: 09/21/2019 If Site Rite image not attached, placement could not be confirmed due to current cardiac rhythm.    Medical Consultants:    None.  Anti-Infectives:   None  Subjective:    Sara Walker  Had a good night.  Objective:    Vitals:   09/21/19 1512 09/21/19 2152 09/22/19 0629 09/22/19 0837  BP: 109/80 112/67 102/69 110/72  Pulse: 84  75 91  Resp:   19   Temp: 97.8 F (36.6 C)  97.8 F (36.6 C)   TempSrc: Oral  Oral   SpO2: 96%  97%   Weight:   118.6 kg   Height:       SpO2: 97 % O2 Flow Rate (L/min): 2 L/min   Intake/Output Summary (Last 24 hours) at 09/22/2019 0841 Last data filed at 09/22/2019 0829 Gross per 24 hour  Intake 963 ml  Output 2550 ml  Net -1587 ml   Filed Weights   09/20/19 0730 09/21/19 0623 09/22/19 0629  Weight: 119.5 kg 120.4 kg 118.6 kg    Exam: General exam: In no acute distress. Respiratory system: Good air movement and clear to auscultation. Cardiovascular system: S1 & S2 heard, RRR.+ JVD,  Gastrointestinal system: Abdomen is nondistended, soft and nontender.  Extremities: 2 +edema. Skin: No rashes, lesions or ulcers Psychiatry: Judgement and insight appear normal. Mood & affect appropriate.     Data Reviewed:    Labs: Basic Metabolic Panel: Recent Labs  Lab 09/17/19 0359 09/17/19 0359 09/18/19 0516 09/18/19 0516 09/19/19 0441 09/19/19 0441 09/19/19 1013  09/20/19 0004 09/20/19 0004 09/21/19 0259 09/22/19 0434  NA 128*   < > 125*  --  123*  --   --  123*  --  125* 125*  K 4.0   < > 3.6   < > 3.4*   < >  --  4.4   < > 4.5 3.7  CL 87*   < > 86*  --  86*  --   --  83*  --  85* 84*  CO2 22   < > 22  --  23  --   --  26  --  26 27  GLUCOSE 113*   < > 295*  --  197*  --   --  180*  --  130* 144*  BUN 52*   < > 67*  --  75*  --   --  78*  --  79* 76*  CREATININE 1.88*   < > 2.11*  --  2.35*  --   --  2.63*  --  2.40* 2.01*  CALCIUM 9.6   < > 9.2  --  8.8*  --   --  9.1  --  9.0 9.0  MG 2.3  --   --   --   --   --  2.7* 3.0*  --  3.0* 2.5*  PHOS  --   --   --   --   --   --   --   --   --   --  3.9   < > = values in this interval not displayed.   GFR Estimated Creatinine Clearance: 36.3 mL/min (A) (by C-G formula based on SCr of 2.01 mg/dL (H)). Liver Function Tests: Recent Labs  Lab 09/20/19 0004 09/22/19 0434  AST 70*  --   ALT 157*  --   ALKPHOS 64  --   BILITOT 1.1  --  PROT 5.8*  --   ALBUMIN 3.4* 3.3*   No results for input(s): LIPASE, AMYLASE in the last 168 hours. No results for input(s): AMMONIA in the last 168 hours. Coagulation profile Recent Labs  Lab 09/19/19 1601  INR 1.4*   COVID-19 Labs  No results for input(s): DDIMER, FERRITIN, LDH, CRP in the last 72 hours.  Lab Results  Component Value Date   SARSCOV2NAA NEGATIVE 09/17/2019   [place] NEGATIVE 04/02/2019    CBC: Recent Labs  Lab 09/16/19 2349 09/17/19 0359 09/18/19 0516  WBC 11.8* 11.0* 9.1  HGB 13.9 13.1 12.7  HCT 43.9 40.8 38.9  MCV 94.0 93.8 91.1  PLT 286 258 269   Cardiac Enzymes: No results for input(s): CKTOTAL, CKMB, CKMBINDEX, TROPONINI in the last 168 hours. BNP (last 3 results) No results for input(s): PROBNP in the last 8760 hours. CBG: Recent Labs  Lab 09/21/19 0817 09/21/19 1121 09/21/19 1724 09/21/19 2120 09/22/19 0751  GLUCAP 133* 200* 129* 133* 131*   D-Dimer: No results for input(s): DDIMER in the last 72  hours. Hgb A1c: No results for input(s): HGBA1C in the last 72 hours. Lipid Profile: No results for input(s): CHOL, HDL, LDLCALC, TRIG, CHOLHDL, LDLDIRECT in the last 72 hours. Thyroid function studies: No results for input(s): TSH, T4TOTAL, T3FREE, THYROIDAB in the last 72 hours.  Invalid input(s): FREET3 Anemia work up: No results for input(s): VITAMINB12, FOLATE, FERRITIN, TIBC, IRON, RETICCTPCT in the last 72 hours. Sepsis Labs: Recent Labs  Lab 09/16/19 2349 09/17/19 0359 09/18/19 0516  WBC 11.8* 11.0* 9.1   Microbiology Recent Results (from the past 240 hour(s))  SARS CORONAVIRUS 2 (TAT 6-24 HRS) Nasopharyngeal Nasopharyngeal Swab     Status: None   Collection Time: 09/17/19 12:04 AM   Specimen: Nasopharyngeal Swab  Result Value Ref Range Status   SARS Coronavirus 2 NEGATIVE NEGATIVE Final    Comment: (NOTE) SARS-CoV-2 target nucleic acids are NOT DETECTED. The SARS-CoV-2 RNA is generally detectable in upper and lower respiratory specimens during the acute phase of infection. Negative results do not preclude SARS-CoV-2 infection, do not rule out co-infections with other pathogens, and should not be used as the sole basis for treatment or other patient management decisions. Negative results must be combined with clinical observations, patient history, and epidemiological information. The expected result is Negative. Fact Sheet for Patients: SugarRoll.be Fact Sheet for Healthcare Providers: https://www.woods-mathews.com/ This test is not yet approved or cleared by the [place] FDA and  has been authorized for detection and/or diagnosis of SARS-CoV-2 by FDA under an Emergency Use Authorization (EUA). This EUA will remain  in effect (meaning this test can be used) for the duration of the COVID-19 declaration under Section 56 4(b)(1) of the Act, 21 U.S.C. section 360bbb-3(b)(1), unless the authorization is terminated  or revoked sooner. Performed at [facility], [place] [address], [place], [place] [address]      Medications:   . allopurinol  450 mg Oral Daily  . apixaban  5 mg Oral BID  . carvedilol  25 mg Oral BID  . insulin aspart  0-5 Units Subcutaneous QHS  . insulin aspart  0-9 Units Subcutaneous TID WC  . insulin aspart  6 Units Subcutaneous TID WC  . insulin glargine  22 Units Subcutaneous BID  . Melatonin  6 mg Oral Once  . potassium chloride  40 mEq Oral Daily  . sodium chloride flush  3 mL Intravenous Q12H  . torsemide  50 mg Oral BID  Continuous Infusions:     LOS: 5 days   Charlynne Cousins  [facility]  09/22/2019, 8:41 AM

## 2019-09-23 DIAGNOSIS — I4819 Other persistent atrial fibrillation: Secondary | ICD-10-CM

## 2019-09-23 DIAGNOSIS — I340 Nonrheumatic mitral (valve) insufficiency: Secondary | ICD-10-CM

## 2019-09-23 DIAGNOSIS — Z95810 Presence of automatic (implantable) cardiac defibrillator: Secondary | ICD-10-CM

## 2019-09-23 LAB — RENAL FUNCTION PANEL
Albumin: 3.5 g/dL (ref 3.5–5.0)
Anion gap: 16 — ABNORMAL HIGH (ref 5–15)
BUN: 74 mg/dL — ABNORMAL HIGH (ref 8–23)
CO2: 28 mmol/L (ref 22–32)
Calcium: 9.6 mg/dL (ref 8.9–10.3)
Chloride: 84 mmol/L — ABNORMAL LOW (ref 98–111)
Creatinine, Ser: 1.88 mg/dL — ABNORMAL HIGH (ref 0.44–1.00)
GFR calc Af Amer: 31 mL/min — ABNORMAL LOW (ref >60)
GFR calc non Af Amer: 27 mL/min — ABNORMAL LOW (ref >60)
Glucose, Bld: 82 mg/dL (ref 70–99)
Phosphorus: 4.1 mg/dL (ref 2.5–4.6)
Potassium: 3.6 mmol/L (ref 3.5–5.1)
Sodium: 128 mmol/L — ABNORMAL LOW (ref 135–145)

## 2019-09-23 LAB — GLUCOSE, CAPILLARY
Glucose-Capillary: 82 mg/dL (ref 70–99)
Glucose-Capillary: 81 mg/dL (ref 70–99)
Glucose-Capillary: 114 mg/dL — ABNORMAL HIGH (ref 70–99)
Glucose-Capillary: 225 mg/dL — ABNORMAL HIGH (ref 70–99)
Glucose-Capillary: 121 mg/dL — ABNORMAL HIGH (ref 70–99)
Glucose-Capillary: 89 mg/dL (ref 70–99)

## 2019-09-23 LAB — MAGNESIUM: Magnesium: 2.7 mg/dL — ABNORMAL HIGH (ref 1.7–2.4)

## 2019-09-23 MED ORDER — POTASSIUM CHLORIDE CRYS ER 20 MEQ PO TBCR: 40.0000 meq | EXTENDED_RELEASE_TABLET | Freq: Two times a day (BID) | ORAL | Status: DC

## 2019-09-23 MED ORDER — POTASSIUM CHLORIDE CRYS ER 20 MEQ PO TBCR: 40.0000 meq | EXTENDED_RELEASE_TABLET | Freq: Every day | ORAL | Status: DC

## 2019-09-23 MED ORDER — POTASSIUM CHLORIDE CRYS ER 20 MEQ PO TBCR: 20.0000 meq | EXTENDED_RELEASE_TABLET | Freq: Once | ORAL | Status: DC

## 2019-09-23 MED ORDER — METOLAZONE 5 MG PO TABS
5.0000 mg | ORAL_TABLET | Freq: Once | ORAL | Status: AC
  Administered 2019-09-23: 5 mg via ORAL
  Filled 2019-09-23: qty 1

## 2019-09-23 MED ORDER — POTASSIUM CHLORIDE CRYS ER 20 MEQ PO TBCR
40.0000 meq | EXTENDED_RELEASE_TABLET | Freq: Once | ORAL | Status: AC
  Administered 2019-09-23: 40 meq via ORAL
  Filled 2019-09-23: qty 2

## 2019-09-23 MED ORDER — AMIODARONE HCL 200 MG PO TABS
400.0000 mg | ORAL_TABLET | Freq: Every day | ORAL | Status: DC
  Administered 2019-09-23 – 2019-09-26 (×4): 400 mg via ORAL
  Filled 2019-09-23 (×4): qty 2

## 2019-09-23 NOTE — Progress Notes (Signed)
Nephrology Progress Note:  Subjective:   1.6 liters and an additional unmeasured void of urine for 3/14.  Note she had 2.6 liters UOP as well as 5 unmeasured voids on 3/13 in comparison.  Has been on torsemide 50 mg BID but missed the 3/14 PM dose - states was due to hypotension.  Spoke with her daughter at bedside. Worried about her kidneys.  takes atypical torsemide regimen at home meant to optimize metolazone function- torsemide 50 mg at noon, metolazone when she takes it, and then 2pm takes torsemide 50 mg 2nd dose  - states has been on this regimen several years   Review of systems:  Reports shortness of breath  Legs Feel swollen and tight  Some nausea no vomiting; eating lunch now  Ambulated in room   Objective Vital signs in last 24 hours: Vitals:   09/22/19 1538 09/22/19 1723 09/23/19 0529 09/23/19 0858  BP: 104/71 (!) 95/50 (!) 92/57 127/73  Pulse:   79   Resp:      Temp:   97.6 F (36.4 C)   TempSrc:   Oral   SpO2:   95%   Weight:   118.9 kg   Height:       Weight change: 0.363 kg  Intake/Output Summary (Last 24 hours) at 09/23/2019 1243 Last data filed at 09/23/2019 0900 Gross per 24 hour  Intake 290 ml  Output 650 ml  Net -360 ml    Assessment/ Plan: Pt is a 69 y.o. yo female with HTN, CHF and Afib who was admitted on 09/16/2019 with Afib with RVR and volume overload-  Baseline crt around 1-  Has developed AKI   Assessment/Plan: 1. AKI-  U/A is bland.  BL Cr around 1.  Seeming to be hemodynamic in cause.  Hopefully since we have seen some more consistent hemodynamics will start to see renal function plateau and improve - continues to improve with Cr 1.88     2. HTN/vol-  Volume overloaded with hypotension which is improving     3. Hyponatremia-  Seeming like hypervolemic hyponatremia - urine osm is appropriately low.  Should be treated with loop diuretic.   - Continue torsemide  - She normally takes 100 of torsemide at home daily with occasional metolazone  and  also 50 of aldactone.  - Continue torsemide - note has been on 50 mg BID here and takes atypical regimen at home - 50 mg at noon, metolazone when takes, and then 2pm takes torsemide 50 mg 2nd dose.  May need to transition to her home regimen per her response - Repeat metolazone 5 mg PO once today  -  Hope that with crt improved she may autodiurese some as well    4.  Afib-  S/p cardiazem.  Per cardiology.  With pacer  5,  Hypokalemia-  On potassium supplementation with 40 meq daily.  Would defer decrease to 40 meq BID of potassium for now given renal impairment.  Give additional 20 meq potassium today afternoon dose   Labs: Basic Metabolic Panel: Recent Labs  Lab 09/21/19 0259 09/22/19 0434 09/23/19 0329  NA 125* 125* 128*  K 4.5 3.7 3.6  CL 85* 84* 84*  CO2 26 27 28   GLUCOSE 130* 144* 82  BUN 79* 76* 74*  CREATININE 2.40* 2.01* 1.88*  CALCIUM 9.0 9.0 9.6  PHOS  --  3.9 4.1   Liver Function Tests: Recent Labs  Lab 09/20/19 0004 09/22/19 0434 09/23/19 0329  AST 70*  --   --  ALT 157*  --   --   ALKPHOS 64  --   --   BILITOT 1.1  --   --   PROT 5.8*  --   --   ALBUMIN 3.4* 3.3* 3.5   CBC: Recent Labs  Lab 09/16/19 2349 09/17/19 0359 09/18/19 0516  WBC 11.8* 11.0* 9.1  HGB 13.9 13.1 12.7  HCT 43.9 40.8 38.9  MCV 94.0 93.8 91.1  PLT 286 258 269   Cardiac Enzymes: No results for input(s): CKTOTAL, CKMB, CKMBINDEX, TROPONINI in the last 168 hours. CBG: Recent Labs  Lab 09/22/19 1653 09/22/19 2141 09/23/19 0346 09/23/19 0800 09/23/19 1155  GLUCAP 207* 170* 82 81 114*    Iron Studies: No results for input(s): IRON, TIBC, TRANSFERRIN, FERRITIN in the last 72 hours. Studies/Results: No results found. Medications: Infusions:   Scheduled Medications: . allopurinol  450 mg Oral Daily  . amiodarone  400 mg Oral Daily  . apixaban  5 mg Oral BID  . carvedilol  25 mg Oral BID  . insulin aspart  0-5 Units Subcutaneous QHS  . insulin aspart  0-9 Units  Subcutaneous TID WC  . insulin aspart  6 Units Subcutaneous TID WC  . insulin glargine  22 Units Subcutaneous BID  . Melatonin  6 mg Oral Once  . potassium chloride  40 mEq Oral BID  . sodium chloride flush  3 mL Intravenous Q12H  . torsemide  50 mg Oral BID    have reviewed scheduled and prn medications.  Physical Exam:  General: adult female sitting on side of bed eating lunch  HEENT NCAT EOMI sclera anicteric Heart: S1S2; no rub Lungs: basilar crackles; inc work of breathing with exertion; unlabored at rest Abdomen: obese habitus/soft nontender Extremities: 2+ pitting edema   Psych normal mood and affect Neuro - alert and oriented x 3;provides a hx and follows commands   Sara Desanctis, MD  09/23/2019,1:16 PM   LOS: 6 days

## 2019-09-23 NOTE — Progress Notes (Signed)
[facility] PROGRESS NOTE    Progress Note  [name]  KKX:[id number] DOB: 1950-11-06 DOA: 09/16/2019 PCP: Deland Pretty, MD     Brief Narrative:   Sara Walker is an 69 y.o. female past medical history of breast cancer treated with Adriamycin and radiation, chronic combined systolic and diastolic heart failure with an EF of 20%, insulin-dependent diabetes mellitus type 2, with a history of polymorphic VT status post AICD presents to the emergency room with shortness of breath chest discomfort and A. fib with RVR.  She received her first dose of SARS-CoV-2 on 09/09/2019 20 after she started developing nausea generalized weakness shortness of breath chest discomfort and palpitation.  In the ED she was found to be afebrile satting greater than 90%, her blood pressure was in the 100s, EKG showed A. fib with RVR with a heart rate of 125, chest x-ray showed pulmonary edema her sodium was 128, creatinine 1.8.  Assessment/Plan:   New onset Atrial fibrillation with RVR (HCC) She was started on IV diltiazem her chads VASC2 is greater than 4, was previously on anticoagulation but she was hesitant to take it as an outpatient, due to her recent vaginal bleed. Her TTE showed very poor ejection fraction of 15% with severe septum and lateral wall dyssynchrony and paradoxical septal movement. She underwent TEE when her heart rate was controlled which showed a thrombus in the left atrial appendage severely dilated left ventricular function and an EF of 25% mildly dilated RV and right ventricular dysfunction, severely dilated left atrium.  And severe tricuspid regurgitation with severe mitral regurgitation likely related to her annular dilation. Patient is now agreeable to anticoagulation she is currently on Eliquis. Cardiology will attempt a TEE cardioversion in 4 weeks and reevaluate her degree of mitral regurgitation once in sinus rhythm. PICC line was inserted on 09/21/2019, which shows a co-ox of  75. He is a poor candidate for open heart surgery, he might benefit from MitraClip. have D'cd IV Cardizem and now on Coreg 12.5 p.o. twice daily.  Acute on chronic combined systolic and diastolic heart failure/with a history of nonischemic cardiomyopathy secondary to Adriamycin: With a BNP greater than 1000, chest x-ray with bilateral pulmonary infiltrates, with positive JVD and lower extremity edema. She was started on IV diuresis, 2D echo showed lateral and septal disynchrony and paradoxical septal motion. EF of 25% with global hypokinesia, right ventricle systolic function is mildly reduced with biatrial enlargement.  TEE showed a left atrial thrombus in the appendage cardioversion cannot be performed. She continues to be fluid overloaded on physical exam. She continues to be negative, she is about 1.1 L negative in the last 24 hours. Nephrology recommended to continue torsemide, and her creatinine continues to improve. She is auto diuresing on current dose of torsemide.  Her sodium is also improving.  Acute kidney injury: With a baseline creatinine of less than 1, on admission 1.8.  She was started on IV Lasix and her creatinine has continued to trend up.  Once her hemodynamic specially her heart rate improved. Renal recommended to restart her home dose of torsemide, and her urine output has improved, her creatinine is also improving along with her sodium.  Hypervolemic hyponatremia: Started on torsemide and her sodium is slowly improving today is 128 her hemodynamics seems to be improving.  Insulin-dependent diabetes mellitus type 2/hyperglycemia: Her insulin pump was discontinued. She was started on long-acting insulin per sliding scale, we will cont. To  titrate her long-acting insulin her blood glucose  this morning is improved, will continue current regimen plus sliding scale.  VT: In 2016 she was noted to be having torsades in the setting of major electrolyte abnormalities and heart  failure exacerbation. Monitoring in telemetry with no events.  Hyperlipidemia: Continue Repatha.  Hypokalemia: We will give her single dose orally. Check a magnesium level.  Constipation: She has had several BM, now resolved.  DVT prophylaxis: heparin Family Communication:none Disposition Plan/Barrier to D/C: Patient can be discharged once her heart rate and her creatinine has improved.  Code Status:     Code Status Orders  (From admission, onward)         Start     Ordered   09/17/19 0333  Full code  Continuous     09/17/19 0335        Code Status History    Date Active Date Inactive Code Status Order ID Comments User Context   04/04/2019 0636 04/04/2019 1149 Full Code [id number]  Everlene Farrier, MD Inpatient   02/23/2018 1448 02/23/2018 1904 Full Code [id number]  Sanda Klein, MD Inpatient   08/28/2013 1846 08/30/2013 1413 Full Code [id number]  Geradine Girt, DO Inpatient   Advance Care Planning Activity    Advance Directive Documentation     Most Recent Value  Type of Advance Directive  Healthcare Power of Attorney, Living will  Pre-existing out of facility DNR order (yellow form or pink MOST form)  --  "MOST" Form in Place?  --        IV Access:    Peripheral IV   Procedures and diagnostic studies:   [place] EKG SITE RITE  Result Date: 09/21/2019 If Site Rite image not attached, placement could not be confirmed due to current cardiac rhythm.    Medical Consultants:    None.  Anti-Infectives:   None  Subjective:    Sara Walker did not have a good night.  Objective:    Vitals:   09/22/19 1538 09/22/19 1723 09/23/19 0529 09/23/19 0858  BP: 104/71 (!) 95/50 (!) 92/57 127/73  Pulse:   79   Resp:      Temp:   97.6 F (36.4 C)   TempSrc:   Oral   SpO2:   95%   Weight:   118.9 kg   Height:       SpO2: 95 % O2 Flow Rate (L/min): 2 L/min   Intake/Output Summary (Last 24 hours) at 09/23/2019 0939 Last data filed at 09/22/2019  2242 Gross per 24 hour  Intake 293 ml  Output 1550 ml  Net -1257 ml   Filed Weights   09/21/19 0623 09/22/19 0629 09/23/19 0529  Weight: 120.4 kg 118.6 kg 118.9 kg    Exam: General exam: In no acute distress. Respiratory system: Good air movement and clear to auscultation. Cardiovascular system: S1 & S2 heard, RRR.  Positive JVD Gastrointestinal system: Abdomen is nondistended, soft and nontender.  Central nervous system: Alert and oriented. No focal neurological deficits. Extremities: 3+ edema. Skin: No rashes, lesions or ulcers    Data Reviewed:    Labs: Basic Metabolic Panel: Recent Labs  Lab 09/17/19 0359 09/19/19 0441 09/19/19 0441 09/19/19 1013 09/20/19 0004 09/20/19 0004 09/21/19 0259 09/21/19 0259 09/22/19 0434 09/23/19 0329  NA  --  123*  --   --  123*  --  125*  --  125* 128*  K  --  3.4*   < >  --  4.4   < > 4.5   < > 3.7 3.6  CL  --  86*  --   --  83*  --  85*  --  84* 84*  CO2  --  23  --   --  26  --  26  --  27 28  GLUCOSE  --  197*  --   --  180*  --  130*  --  144* 82  BUN  --  75*  --   --  78*  --  79*  --  76* 74*  CREATININE  --  2.35*  --   --  2.63*  --  2.40*  --  2.01* 1.88*  CALCIUM  --  8.8*  --   --  9.1  --  9.0  --  9.0 9.6  MG   < >  --   --  2.7* 3.0*  --  3.0*  --  2.5* 2.7*  PHOS  --   --   --   --   --   --   --   --  3.9 4.1   < > = values in this interval not displayed.   GFR Estimated Creatinine Clearance: 38.8 mL/min (A) (by C-G formula based on SCr of 1.88 mg/dL (H)). Liver Function Tests: Recent Labs  Lab 09/20/19 0004 09/22/19 0434 09/23/19 0329  AST 70*  --   --   ALT 157*  --   --   ALKPHOS 64  --   --   BILITOT 1.1  --   --   PROT 5.8*  --   --   ALBUMIN 3.4* 3.3* 3.5   No results for input(s): LIPASE, AMYLASE in the last 168 hours. No results for input(s): AMMONIA in the last 168 hours. Coagulation profile Recent Labs  Lab 09/19/19 1601  INR 1.4*   COVID-19 Labs  No results for input(s): DDIMER,  FERRITIN, LDH, CRP in the last 72 hours.  Lab Results  Component Value Date   SARSCOV2NAA NEGATIVE 09/17/2019   [place] NEGATIVE 04/02/2019    CBC: Recent Labs  Lab 09/16/19 2349 09/17/19 0359 09/18/19 0516  WBC 11.8* 11.0* 9.1  HGB 13.9 13.1 12.7  HCT 43.9 40.8 38.9  MCV 94.0 93.8 91.1  PLT 286 258 269   Cardiac Enzymes: No results for input(s): CKTOTAL, CKMB, CKMBINDEX, TROPONINI in the last 168 hours. BNP (last 3 results) No results for input(s): PROBNP in the last 8760 hours. CBG: Recent Labs  Lab 09/22/19 1157 09/22/19 1653 09/22/19 2141 09/23/19 0346 09/23/19 0800  GLUCAP 136* 207* 170* 82 81   D-Dimer: No results for input(s): DDIMER in the last 72 hours. Hgb A1c: No results for input(s): HGBA1C in the last 72 hours. Lipid Profile: No results for input(s): CHOL, HDL, LDLCALC, TRIG, CHOLHDL, LDLDIRECT in the last 72 hours. Thyroid function studies: No results for input(s): TSH, T4TOTAL, T3FREE, THYROIDAB in the last 72 hours.  Invalid input(s): FREET3 Anemia work up: No results for input(s): VITAMINB12, FOLATE, FERRITIN, TIBC, IRON, RETICCTPCT in the last 72 hours. Sepsis Labs: Recent Labs  Lab 09/16/19 2349 09/17/19 0359 09/18/19 0516  WBC 11.8* 11.0* 9.1   Microbiology Recent Results (from the past 240 hour(s))  SARS CORONAVIRUS 2 (TAT 6-24 HRS) Nasopharyngeal Nasopharyngeal Swab     Status: None   Collection Time: 09/17/19 12:04 AM   Specimen: Nasopharyngeal Swab  Result Value Ref Range Status   SARS Coronavirus 2 NEGATIVE NEGATIVE Final    Comment: (NOTE) SARS-CoV-2 target nucleic acids are NOT DETECTED. The SARS-CoV-2  RNA is generally detectable in upper and lower respiratory specimens during the acute phase of infection. Negative results do not preclude SARS-CoV-2 infection, do not rule out co-infections with other pathogens, and should not be used as the sole basis for treatment or other patient management decisions. Negative  results must be combined with clinical observations, patient history, and epidemiological information. The expected result is Negative. Fact Sheet for Patients: SugarRoll.be Fact Sheet for Healthcare Providers: https://www.woods-mathews.com/ This test is not yet approved or cleared by the [place] FDA and  has been authorized for detection and/or diagnosis of SARS-CoV-2 by FDA under an Emergency Use Authorization (EUA). This EUA will remain  in effect (meaning this test can be used) for the duration of the COVID-19 declaration under Section 56 4(b)(1) of the Act, 21 U.S.C. section 360bbb-3(b)(1), unless the authorization is terminated or revoked sooner. Performed at [facility], [place] [address], [place], [place] [address]      Medications:   . allopurinol  450 mg Oral Daily  . apixaban  5 mg Oral BID  . carvedilol  25 mg Oral BID  . insulin aspart  0-5 Units Subcutaneous QHS  . insulin aspart  0-9 Units Subcutaneous TID WC  . insulin aspart  6 Units Subcutaneous TID WC  . insulin glargine  22 Units Subcutaneous BID  . Melatonin  6 mg Oral Once  . potassium chloride  40 mEq Oral Daily  . sodium chloride flush  3 mL Intravenous Q12H  . torsemide  50 mg Oral BID   Continuous Infusions:     LOS: 6 days   Charlynne Cousins  [facility]  09/23/2019, 9:39 AM

## 2019-09-23 NOTE — Progress Notes (Signed)
PT Cancellation Note  Patient Details Name: Sara Walker MRN: [id number] DOB: 03/24/1951   Cancelled Treatment:    Reason Eval/Treat Not Completed: Patient declined, no reason specified. Patient refusing, states she has not slept and is too tired to walk. Will re-attempt later if time allows.   Kirby Cortese 09/23/2019, 10:11 AM

## 2019-09-23 NOTE — Progress Notes (Signed)
Transitions of Care Pharmacist Note  Sara Walker is a 69 y.o. female that has been diagnosed with A Fib and will be prescribed Eliquis (apixaban) at discharge.   Patient Education: I provided the following education on apixaban 3/11 to the patient: How to take the medication Described what the medication is Signs of bleeding Signs/symptoms of VTE and stroke  Answered their questions  Discharge Medications Plan: The patient wants to have their discharge medications filled by the Transitions of Care pharmacy rather than their usual pharmacy.  The discharge orders pharmacy has been changed to the Transitions of Care pharmacy, the patient will receive a phone call regarding co-pay, and their medications will be delivered by the Transitions of Care pharmacy.    Thank you,   Sherren Kerns, PharmD PGY1 Acute Care Pharmacy Resident September 23, 2019

## 2019-09-23 NOTE — Progress Notes (Signed)
Progress Note  Patient Name: Sara Walker Date of Encounter: 09/23/2019  Primary Cardiologist: Sanda Klein, MD   Subjective   Very upset today, did not get any sleep last night because the monitor alarms kept going off. While I was in the room with her, whenever her heart rate would be relatively slow and ventricular pacing ensued, the monitor would detect "VT".  For the most part, while awake she continues to have relatively fast ventricular response in the 90s, which precludes ventricular resynchronization pacing. Short of breath with light activity, but able to walk from one corner of the room to the next.  Did have a couple of episodes of severe dizziness.  Blood pressure running low. Net diuresis roughly 3.8 L since admission.  By contrast, weight is only down by 1 pound since admission, down 3 pounds from maximum weight a couple of days ago.  Inpatient Medications    Scheduled Meds: . allopurinol  450 mg Oral Daily  . amiodarone  400 mg Oral Daily  . apixaban  5 mg Oral BID  . carvedilol  25 mg Oral BID  . insulin aspart  0-5 Units Subcutaneous QHS  . insulin aspart  0-9 Units Subcutaneous TID WC  . insulin aspart  6 Units Subcutaneous TID WC  . insulin glargine  22 Units Subcutaneous BID  . Melatonin  6 mg Oral Once  . potassium chloride  40 mEq Oral Daily  . sodium chloride flush  3 mL Intravenous Q12H  . torsemide  50 mg Oral BID   Continuous Infusions:  PRN Meds: acetaminophen **OR** acetaminophen, alum & mag hydroxide-simeth, bisacodyl, HYDROcodone-acetaminophen, ondansetron **OR** ondansetron (ZOFRAN) IV, zolpidem   Vital Signs    Vitals:   09/22/19 1538 09/22/19 1723 09/23/19 0529 09/23/19 0858  BP: 104/71 (!) 95/50 (!) 92/57 127/73  Pulse:   79   Resp:      Temp:   97.6 F (36.4 C)   TempSrc:   Oral   SpO2:   95%   Weight:   118.9 kg   Height:        Intake/Output Summary (Last 24 hours) at 09/23/2019 1204 Last data filed at 09/23/2019  0900 Gross per 24 hour  Intake 290 ml  Output 650 ml  Net -360 ml   Last 3 Weights 09/23/2019 09/22/2019 09/21/2019  Weight (lbs) 262 lb 3.2 oz 261 lb 6.4 oz 265 lb 6.4 oz  Weight (kg) 118.933 kg 118.57 kg 120.385 kg      Telemetry    Atrial fibrillation with ventricular rate in the mid to high 90s.  Very broad native QRS complex.- Personally Reviewed  ECG    Atrial fibrillation, left bundle branch block- Personally Reviewed  Physical Exam  Severely obese GEN: No acute distress.   Neck: No JVD Cardiac:  Irregular, no murmurs, rubs, or gallops.  Respiratory: Clear to auscultation bilaterally. GI: Soft, nontender, non-distended  MS:  2+ dependent edema; No deformity. Neuro:  Nonfocal  Psych: Normal affect   Labs    High Sensitivity Troponin:   Recent Labs  Lab 09/16/19 2349 09/17/19 0149  TROPONINIHS 39* 38*      Chemistry Recent Labs  Lab 09/20/19 0004 09/20/19 0004 09/21/19 0259 09/22/19 0434 09/23/19 0329  NA 123*   < > 125* 125* 128*  K 4.4   < > 4.5 3.7 3.6  CL 83*   < > 85* 84* 84*  CO2 26   < > 26 27 28   GLUCOSE 180*   < >  130* 144* 82  BUN 78*   < > 79* 76* 74*  CREATININE 2.63*   < > 2.40* 2.01* 1.88*  CALCIUM 9.1   < > 9.0 9.0 9.6  PROT 5.8*  --   --   --   --   ALBUMIN 3.4*  --   --  3.3* 3.5  AST 70*  --   --   --   --   ALT 157*  --   --   --   --   ALKPHOS 64  --   --   --   --   BILITOT 1.1  --   --   --   --   GFRNONAA 18*   < > 20* 25* 27*  GFRAA 21*   < > 23* 29* 31*  ANIONGAP 14   < > 14 14 16*   < > = values in this interval not displayed.     Hematology Recent Labs  Lab 09/16/19 2349 09/17/19 0359 09/18/19 0516  WBC 11.8* 11.0* 9.1  RBC 4.67 4.35 4.27  HGB 13.9 13.1 12.7  HCT 43.9 40.8 38.9  MCV 94.0 93.8 91.1  MCH 29.8 30.1 29.7  MCHC 31.7 32.1 32.6  RDW 14.7 14.6 14.7  PLT 286 258 269    BNP Recent Labs  Lab 09/16/19 2349  BNP 1,100.9*     DDimer No results for input(s): DDIMER in the last 168 hours.    Radiology    No results found.  Cardiac Studies  Transesophageal echocardiogram 09/20/2019 1. Left ventricular ejection fraction, by estimation, is 25 to 30%. The  left ventricle has severely decreased function. The left ventricle  demonstrates global hypokinesis. The left ventricular internal cavity size  was severely dilated.  2. Right ventricular systolic function is mildly reduced. The right  ventricular size is mildly enlarged.  3. There was a loosely formed thrombus in the body of the LA appendage.  Left atrial size was severely dilated.  4. Right atrial size was mildly dilated.  5. Tricuspid valve regurgitation is moderate to severe.  6. The mitral valve is normal in structure. Moderate to severe mitral  valve regurgitation likley related to MV leaflet teathering and annular  dilatation. THe MR wraps around the LA but does not appear to enter the  pulmonary vein by colorflow doppler but  there is intermittent pulmonary vein flow reversal with PW doppler.  7. The aortic valve is tricuspid. Aortic valve regurgitation is not  visualized. Mild aortic valve sclerosis is present, with no evidence of  aortic valve stenosis.  8. Evidence of atrial level shunting detected by color flow Doppler.  Agitated saline contrast bubble study was positive with shunting observed  within 3-6 cardiac cycles suggestive of interatrial shunt. There is a  small patent foramen ovale with  bidirectional shunting across atrial septum.  9. Mild to moderate aortic atherosclerosis is present in the ascending  and descending aorta. aorta.   Patient Profile     69 y.o. female with longstanding history of nonischemic cardiomyopathy, combined systolic and diastolic heart failure, LBBB, CRT hyper responder, biventricular ICD (generator change August 2019), history of hypokalemia related torsades de pointes and defibrillator shocks, insulin requiring diabetes mellitus, CKD stage III, paroxysmal (now  persistent) atrial fibrillation, history of dysfunctional uterine bleeding (status post D&C and IUD a few months ago).  Assessment & Plan    1.CHF: She had an excellent response to resynchronization pacing and has done very poorly with loss of  CRT due to atrial fibrillation.  Continues have very poor CRT due to relatively rapid ventricular rates.  Beta-blockers are limited due to low blood pressure.  She has a history of digitoxin toxicity in the past.  She also has a history of torsades, but I think the benefit of amiodarone for rate control will outweigh its risks.  Would like to start amiodarone in the hospital.  She has a tiny IV in her right hand but I do not think she would tolerate it intravenously.  We will load orally.  She has baseline long QT interval due to a very broad LBBB.  We will watch the QT interval daily. She does not tolerate RAAS inhibitors.  Her blood pressure would definitely not allow these now. She is still about 12 pounds above her "dry weight" of 250 pounds from last December. 2.  Acute on chronic renal insufficiency/hyponatremia: Creatinine is improving with diuresis and the sodium is improving, consistent with cardiorenal syndrome.  We will give another dose of metolazone tomorrow, but I would like to give some additional potassium supplements today to avoid the risks of hypokalemia related arrhythmia. 3. AFib: On maximum usual dose of carvedilol and her blood pressure would not tolerate a higher dose.  Need to establish better rate control.  I think it is doubtful that she will prematurely convert to sinus rhythm with oral amiodarone.  I do believe the benefit of slowing down the ventricular rate and reestablishing CRT outweighs the small risk of embolic stroke with early chemical cardioversion.  Digoxin is another option but she considers himself to be allergic (it sounds to me like she developed toxicity, not a true allergy, in the past).  Plan to bring back for electrical  cardioversion after 3-4 weeks of oral anticoagulant. 4. MR: Described as moderate to severe on TEE, but should be reevaluated after reestablishing CRT and hemodynamic compensation of the heart failure.  Suspect it is secondary to LV dysfunction/dyssynchrony. 5. QT prolongation: This is in large part related to her very broad LBBB.  Monitor carefully for evidence of proarrhythmia after we start amiodarone.  Consider EP consultation if there is any such evidence.     For questions or updates, please contact [place] Please consult [email or web address] for contact info under        Signed, Sanda Klein, MD  09/23/2019, 12:04 PM

## 2019-09-23 NOTE — TOC Initial Note (Signed)
Transition of Care [facility]) - Initial/Assessment Note    Patient Details  Name: Sara Walker MRN: [id number] Date of Birth: Apr 12, 1951  Transition of Care [facility]) CM/SW Contact:    Angelita Ingles, RN Phone Number: 09/23/2019, 3:05 PM  Clinical Narrative: Patient presented with atrial fibrillation. Patient from home alone but states that she has support from son and daughter. Patient lives in single story home independently and reports that she has a walker at home but unsure if the walker has wheels. Daughter to check to determine if walker has wheels and will update case manager to determine a need to order walker with wheels if her walker has no wheels. Patient received med.gov list. Patient has no preference just wants to make sure agency can be setup in [facility]. [facility] has been notified and is agreeable to provide services and accept patient.Start of service to begin within 24-48 hours.Agency will contact patient. Patient states that she is able to obtain meds without difficulty. No further home needs identified at this time.                    Expected Discharge Plan: [place] Barriers to Discharge: No Barriers Identified   Patient Goals and CMS Choice   CMS Medicare.gov Compare Post Acute Care list provided to:: Patient Choice offered to / list presented to : Patient  Expected Discharge Plan and Services Expected Discharge Plan: [place]   Discharge Planning Services: NA Post Acute Care Choice: [place] arrangements for the past 2 months: Single Family Home                           HH Arranged: PT [place]: [facility] Date [place]: 09/23/19 Time [place]: 2951 Representative spoke with at [place] Arrangements/Services Living arrangements for the past 2 months: [place] with:: Self Patient language and need for interpreter reviewed::  Yes        Need for Family Participation in Patient Care: Yes (Comment) Care giver support system in place?: Yes (comment)(daughter and son) Current home services: DME(walker- daughter checking to see if it has wheels) Criminal Activity/Legal Involvement Pertinent to Current Situation/Hospitalization: No - Comment as needed  Activities of Daily Living Home Assistive Devices/Equipment: Wheelchair, Environmental consultant (specify type), Insulin Pump ADL Screening (condition at time of admission) Patient's cognitive ability adequate to safely complete daily activities?: Yes Is the patient deaf or have difficulty hearing?: No Does the patient have difficulty seeing, even when wearing glasses/contacts?: No Does the patient have difficulty concentrating, remembering, or making decisions?: No Patient able to express need for assistance with ADLs?: Yes Does the patient have difficulty dressing or bathing?: No Independently performs ADLs?: Yes (appropriate for developmental age) Does the patient have difficulty walking or climbing stairs?: Yes Weakness of Legs: Both Weakness of Arms/Hands: None  Permission Sought/Granted Permission sought to share information with : Investment banker, corporate granted to share info w AGENCY: [facility]        Emotional Assessment Appearance:: Appears stated age Attitude/Demeanor/Rapport: Gracious Affect (typically observed): Calm Orientation: : Oriented to Self, Oriented to Place, Oriented to  Time, Oriented to Situation Alcohol / Substance Use: Not Applicable Psych Involvement: No (comment)  Admission diagnosis:  Hypoglycemia [E16.2] Acute on chronic systolic congestive heart failure (HCC) [I50.23] Atrial fibrillation with RVR ([place]) [  I48.91] Patient Active Problem List   Diagnosis Date Noted  . Persistent atrial fibrillation ([place])   . Severe mitral insufficiency 09/22/2019  . Severe tricuspid regurgitation 09/22/2019  . Pacemaker   . Atrial  fibrillation with RVR ([place]) 09/17/2019  . Hypoglycemia due to insulin 09/17/2019  . Mixed hyperlipidemia 06/20/2018  . ICD (implantable cardioverter-defibrillator) battery depletion 02/23/2018  . 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 Torsades de pointes ([place]) 01/25/2018  . Acute bronchitis 07/28/2015  . Class 2 severe obesity with body mass index (BMI) of 35 to 39.9 with serious comorbidity ([place]) 12/19/2014  . VT (ventricular tachycardia) ([place]) 10/17/2014  . Paroxysmal atrial fibrillation ([place]) 06/10/2014  . Recurrent vomiting 08/29/2013  . AKI (acute kidney injury) ([place]) 08/28/2013  . Nausea & vomiting 08/28/2013  . Dehydration 08/28/2013  . Hypokalemia 08/28/2013  . Hyponatremia 08/28/2013  . Leukocytosis 08/28/2013  . Acute on chronic systolic congestive heart failure ([place]) 08/20/2013  . Muscle herniation 06/19/2012  . Nonischemic cardiomyopathy ([place]) 12/13/2011  . Biventricular ICD (implantable cardioverter-defibrillator) in place 12/12/2011  . Cardiomyopathy secondary to chemotherapy ([place])   . Acute on chronic combined systolic and diastolic CHF (congestive heart failure) ([place])   . Syncope   . Doxorubicin adverse reaction 10/11/2011  . LBBB (left bundle branch block) 10/11/2011  . Goiter   . Diabetes mellitus type 2 in obese ([place])   . Invasive ductal carcinoma of breast ([place])   . Gout   . Hypercholesterolemia   . Essential hypertension    PCP:  Deland Pretty, MD Pharmacy:   [facility] [address], [place] - [place] [address] Phone: [phone] Fax: [place], [place] [address] [place] [address] Phone: [phone] Fax: [phone]     Social Determinants of Health (SDOH) Interventions    Readmission Risk Interventions No flowsheet data found.

## 2019-09-23 NOTE — Care Management Important Message (Signed)
Important Message  Patient Details  Name: Sara Walker MRN: [id number] Date of Birth: [birth year]   Medicare Important Message Given:  Yes     [name] 09/23/2019, 9:29 AM

## 2019-09-23 NOTE — Progress Notes (Signed)
Physical Therapy Treatment Patient Details Name: Sara Walker MRN: [id number] DOB: 1950/11/05 Today's Date: 09/23/2019    History of Present Illness Patient is a 69 y/o female who presents with SOB and chest pain. Admitted with new onset A-fib with RVR. s/p TEE showing thrombus in LAA. PMH includes severe nonischemic cardiomyopathy, LBBB, HTN, HLD, breast ca, defibrillator, depression, CAD, CKD,  DM, COPD, CHF,    PT Comments    Patient received sitting on side of bed, daughter present. Finishing lunch. Patient agrees to PT session. She reports she is tired and has not been able to have BM. Patient performed sit to stand transfer without assist. Ambulated 60 feet in room with RW and supervision. No lob, reports fatigue and SOB with ambulation. O2 saturations >93% throughout. She will continue to benefit from skilled PT to improve strength and activity tolerance.        Follow Up Recommendations  Home health PT     Equipment Recommendations  Rolling walker with 5" wheels    Recommendations for Other Services       Precautions / Restrictions Precautions Precaution Comments: mod fall Restrictions Weight Bearing Restrictions: No    Mobility  Bed Mobility               General bed mobility comments: patient received sitting up on side of bed, daughter present  Transfers Overall transfer level: Independent Equipment used: Rolling walker (2 wheeled)             General transfer comment: stood from bed, no assist needed  Ambulation/Gait Ambulation/Gait assistance: Supervision   Assistive device: Rolling walker (2 wheeled) Gait Pattern/deviations: WFL(Within Functional Limits) Gait velocity: WFL   General Gait Details: good balance, pace, Reports fatigue after 3 laps in room ( approx 60 feet). Declines walking out in the hallway stating she cannot walk with the mask on.   Stairs             Wheelchair Mobility    Modified Rankin (Stroke Patients  Only)       Balance Overall balance assessment: Modified Independent Sitting-balance support: Feet supported Sitting balance-Leahy Scale: Good     Standing balance support: Bilateral upper extremity supported;During functional activity Standing balance-Leahy Scale: Good                              Cognition Arousal/Alertness: Awake/alert Behavior During Therapy: WFL for tasks assessed/performed Overall Cognitive Status: Within Functional Limits for tasks assessed                                 General Comments: pleasantly irritable      Exercises      General Comments        Pertinent Vitals/Pain Pain Assessment: Faces Faces Pain Scale: Hurts a little bit Pain Location: B LE Pain Descriptors / Indicators: Tightness;Discomfort Pain Intervention(s): Monitored during session    Home Living                      Prior Function            PT Goals (current goals can now be found in the care plan section) Acute Rehab PT Goals Patient Stated Goal: to return home at discharge PT Goal Formulation: With patient Time For Goal Achievement: 10/04/19 Potential to Achieve Goals: Good Progress towards PT goals: Progressing toward goals  Frequency    Min 3X/week      PT Plan Discharge plan needs to be updated    Co-evaluation              AM-PAC PT "6 Clicks" Mobility   Outcome Measure  Help needed turning from your back to your side while in a flat bed without using bedrails?: A Little Help needed moving from lying on your back to sitting on the side of a flat bed without using bedrails?: A Little Help needed moving to and from a bed to a chair (including a wheelchair)?: None Help needed standing up from a chair using your arms (e.g., wheelchair or bedside chair)?: None Help needed to walk in hospital room?: A Little Help needed climbing 3-5 steps with a railing? : A Lot 6 Click Score: 19    End of Session    Activity Tolerance: Patient tolerated treatment well Patient left: in bed;with family/visitor present Nurse Communication: Mobility status PT Visit Diagnosis: Muscle weakness (generalized) (M62.81);Difficulty in walking, not elsewhere classified (R26.2);Pain Pain - Right/Left: (bilateral due to swelling) Pain - part of body: Leg     Time: 1300-1325 PT Time Calculation (min) (ACUTE ONLY): 25 min  Charges:  $Gait Training: 8-22 mins $Therapeutic Activity: 8-22 mins                     Shanna Un, PT, GCS 09/23/19,1:38 PM

## 2019-09-24 LAB — GLUCOSE, CAPILLARY
Glucose-Capillary: 80 mg/dL (ref 70–99)
Glucose-Capillary: 89 mg/dL (ref 70–99)
Glucose-Capillary: 117 mg/dL — ABNORMAL HIGH (ref 70–99)
Glucose-Capillary: 171 mg/dL — ABNORMAL HIGH (ref 70–99)
Glucose-Capillary: 215 mg/dL — ABNORMAL HIGH (ref 70–99)

## 2019-09-24 LAB — RENAL FUNCTION PANEL
Albumin: 3.5 g/dL (ref 3.5–5.0)
Anion gap: 13 (ref 5–15)
BUN: 78 mg/dL — ABNORMAL HIGH (ref 8–23)
CO2: 26 mmol/L (ref 22–32)
Calcium: 9.6 mg/dL (ref 8.9–10.3)
Chloride: 88 mmol/L — ABNORMAL LOW (ref 98–111)
Creatinine, Ser: 2.12 mg/dL — ABNORMAL HIGH (ref 0.44–1.00)
GFR calc Af Amer: 27 mL/min — ABNORMAL LOW (ref >60)
GFR calc non Af Amer: 23 mL/min — ABNORMAL LOW (ref >60)
Glucose, Bld: 89 mg/dL (ref 70–99)
Phosphorus: 4.3 mg/dL (ref 2.5–4.6)
Potassium: 4.9 mmol/L (ref 3.5–5.1)
Sodium: 127 mmol/L — ABNORMAL LOW (ref 135–145)

## 2019-09-24 LAB — MAGNESIUM: Magnesium: 2.7 mg/dL — ABNORMAL HIGH (ref 1.7–2.4)

## 2019-09-24 NOTE — Progress Notes (Signed)
Nephrology Progress Note:  Subjective:   She doesn't have urine output charted for 3/15 other than one unmeasured void.  Weight down 1 kg today.  Has been on torsemide 50 mg BID and got metolazone on 3/15.  BL Cr reported as 1 here but patient states has been told of low kidney function before.  Potassium was d/c'd this AM before she got a dose.  She would hope to avoid dialysis and at first states would never want it then states would try   Takes atypical torsemide regimen at home meant to optimize metolazone function- torsemide 50 mg at noon, metolazone when she takes it, and then 2pm takes torsemide 50 mg 2nd dose  - states has been on this regimen several years.     Review of systems:   Has shortness of breath but is better On her period - has an IUD  Didn't make much urine last night but increased output this AM per pt; she doesn't think is being recorded (spoke with RN)  Some nausea no vomiting; dry heaving; about to try fruit  Ambulated in room   Objective Vital signs in last 24 hours: Vitals:   09/23/19 0858 09/23/19 1328 09/23/19 1941 09/24/19 0702  BP: 127/73 117/62 107/81 97/71  Pulse:  89 77 76  Resp:  20    Temp:  98 F (36.7 C) 98.1 F (36.7 C) (!) 97.5 F (36.4 C)  TempSrc:  Oral Oral Oral  SpO2:  97% 100% 98%  Weight:    117.9 kg  Height:       Weight change:   Intake/Output Summary (Last 24 hours) at 09/24/2019 1309 Last data filed at 09/24/2019 1200 Gross per 24 hour  Intake 240 ml  Output 301 ml  Net -61 ml    Assessment/ Plan: Pt is a 69 y.o. yo female with HTN, CHF and Afib who was admitted on 09/16/2019 with Afib with RVR and volume overload-  Baseline crt around 1-  Has developed AKI   Assessment/Plan: 1. AKI-  U/A is bland.  BL Cr around 1 but pt states has been told of low kidney function before.  Cr 0.92 in 03/2019.  AKI seeming to be hemodynamic in cause.  Hopefully will plateau and start to improve - continue supportive care.  She would really hope  to avoid dialysis and at first states would never want it    2. HTN/vol-  Volume overloaded with hypotension which is improving  3. Acute on chronic systolic CHF  - Note has been on 50 mg BID here and takes atypical regimen at home - 50 mg at noon, metolazone when takes, and then 2pm takes torsemide 50 mg 2nd dose.  May need to transition to her home regimen per her response.  Metolazone 5 mg on 3/15    - Continue torsemide 50 mg BID for now - defer metolazone today   4. Hyponatremia-  Seeming like hypervolemic hyponatremia - urine osm is appropriately low.  Should be treated with loop diuretic.   - Continue torsemide  -  Hopeful that when Cr improves she may autodiurese some as well   5.  Afib-  S/p cardiazem. On amio.  Per cardiology.  With pacer  6.  Hypokalemia-  On potassium supplementation with 40 meq daily at home.  Potassium here was just discontinued.  Would now follow K with worsening renal function   Labs: Basic Metabolic Panel: Recent Labs  Lab 09/22/19 0434 09/23/19 0329 09/24/19 0242  NA  125* 128* 127*  K 3.7 3.6 4.9  CL 84* 84* 88*  CO2 27 28 26   GLUCOSE 144* 82 89  BUN 76* 74* 78*  CREATININE 2.01* 1.88* 2.12*  CALCIUM 9.0 9.6 9.6  PHOS 3.9 4.1 4.3   Liver Function Tests: Recent Labs  Lab 09/20/19 0004 09/20/19 0004 09/22/19 0434 09/23/19 0329 09/24/19 0242  AST 70*  --   --   --   --   ALT 157*  --   --   --   --   ALKPHOS 64  --   --   --   --   BILITOT 1.1  --   --   --   --   PROT 5.8*  --   --   --   --   ALBUMIN 3.4*   < > 3.3* 3.5 3.5   < > = values in this interval not displayed.   CBC: Recent Labs  Lab 09/18/19 0516  WBC 9.1  HGB 12.7  HCT 38.9  MCV 91.1  PLT 269   Cardiac Enzymes: No results for input(s): CKTOTAL, CKMB, CKMBINDEX, TROPONINI in the last 168 hours. CBG: Recent Labs  Lab 09/23/19 2128 09/23/19 2318 09/24/19 0253 09/24/19 0717 09/24/19 1128  GLUCAP 121* 89 80 89 117*    Iron Studies: No results for  input(s): IRON, TIBC, TRANSFERRIN, FERRITIN in the last 72 hours. Studies/Results: No results found. Medications: Infusions:   Scheduled Medications: . allopurinol  450 mg Oral Daily  . amiodarone  400 mg Oral Daily  . apixaban  5 mg Oral BID  . carvedilol  25 mg Oral BID  . insulin aspart  0-5 Units Subcutaneous QHS  . insulin aspart  0-9 Units Subcutaneous TID WC  . insulin aspart  6 Units Subcutaneous TID WC  . insulin glargine  22 Units Subcutaneous BID  . Melatonin  6 mg Oral Once  . sodium chloride flush  3 mL Intravenous Q12H  . torsemide  50 mg Oral BID    have reviewed scheduled and prn medications.  Physical Exam:    General: adult female sitting on side of bed  HEENT NCAT EOMI sclera anicteric Heart: S1S2; no rub Lungs: basilar crackles; unlabored at rest on room air  Abdomen: obese habitus/soft nontender Extremities: 2+ pitting edema   Psych normal mood and affect Neuro - alert and oriented x 3;provides a hx and follows commands   Claudia Desanctis, MD  09/24/2019,1:39 PM   LOS: 7 days

## 2019-09-24 NOTE — Progress Notes (Addendum)
Progress Note  Patient Name: Sara Walker Date of Encounter: 09/24/2019  Primary Cardiologist: Sanda Klein, MD   Subjective   Relieved that the monitor did not beep. Has been struggling with nausea, believes it started after her vaccine, but it was worse last pm, w/ dry heaves.  That was in association w/ low CBG. The last 2 nights, CBG has dropped into the low 80s, she says this gives her nausea.  Because of all this, she lost 2 more lbs. Still not much sleep.   Inpatient Medications    Scheduled Meds: . allopurinol  450 mg Oral Daily  . amiodarone  400 mg Oral Daily  . apixaban  5 mg Oral BID  . carvedilol  25 mg Oral BID  . insulin aspart  0-5 Units Subcutaneous QHS  . insulin aspart  0-9 Units Subcutaneous TID WC  . insulin aspart  6 Units Subcutaneous TID WC  . insulin glargine  22 Units Subcutaneous BID  . Melatonin  6 mg Oral Once  . sodium chloride flush  3 mL Intravenous Q12H  . torsemide  50 mg Oral BID   Continuous Infusions:  PRN Meds: acetaminophen **OR** acetaminophen, alum & mag hydroxide-simeth, bisacodyl, HYDROcodone-acetaminophen, ondansetron **OR** ondansetron (ZOFRAN) IV, zolpidem   Vital Signs    Vitals:   09/23/19 0858 09/23/19 1328 09/23/19 1941 09/24/19 0702  BP: 127/73 117/62 107/81 97/71  Pulse:  89 77 76  Resp:  20    Temp:  98 F (36.7 C) 98.1 F (36.7 C) (!) 97.5 F (36.4 C)  TempSrc:  Oral Oral Oral  SpO2:  97% 100% 98%  Weight:    117.9 kg  Height:        Intake/Output Summary (Last 24 hours) at 09/24/2019 0745 Last data filed at 09/23/2019 0900 Gross per 24 hour  Intake 240 ml  Output -  Net 240 ml   Last 3 Weights 09/24/2019 09/23/2019 09/22/2019  Weight (lbs) 260 lb 262 lb 3.2 oz 261 lb 6.4 oz  Weight (kg) 117.935 kg 118.933 kg 118.57 kg      Telemetry    Afib, more paced beats than previously, paced beats have definitely shorter QT - Personally Reviewed  ECG    03/11 Atrial fibrillation, left bundle branch  block, QT on intrinsic beats is 602 ms, on paced beats 524 ms (my measurement) - Personally Reviewed  Physical Exam   General: morbidly obese, female in mild distress Head: Eyes PERRLA, Head normocephalic and atraumatic Lungs: decreased BS bases bilaterally to auscultation. Heart: Irreg R&R, S1 S2, without rub or gallop. No murmur. upper extremity pulses are 2+ & equal. JVD 10 cm Abdomen: Bowel sounds are present, abdomen soft and non-tender without masses or  hernias noted. Msk: Normal strength and tone for age. Extremities: No clubbing, cyanosis, 2+ edema.    Skin:  No rashes or lesions noted. Ecchymosis noted inner upper R arm Neuro: Alert and oriented X 3. Psych:  Good affect, responds appropriately  Labs    High Sensitivity Troponin:   Recent Labs  Lab 09/16/19 2349 09/17/19 0149  TROPONINIHS 39* 38*      Chemistry Recent Labs  Lab 09/20/19 0004 09/21/19 0259 09/22/19 0434 09/23/19 0329 09/24/19 0242  NA 123*   < > 125* 128* 127*  K 4.4   < > 3.7 3.6 4.9  CL 83*   < > 84* 84* 88*  CO2 26   < > 27 28 26   GLUCOSE 180*   < >  144* 82 89  BUN 78*   < > 76* 74* 78*  CREATININE 2.63*   < > 2.01* 1.88* 2.12*  CALCIUM 9.1   < > 9.0 9.6 9.6  PROT 5.8*  --   --   --   --   ALBUMIN 3.4*  --  3.3* 3.5 3.5  AST 70*  --   --   --   --   ALT 157*  --   --   --   --   ALKPHOS 64  --   --   --   --   BILITOT 1.1  --   --   --   --   GFRNONAA 18*   < > 25* 27* 23*  GFRAA 21*   < > 29* 31* 27*  ANIONGAP 14   < > 14 16* 13   < > = values in this interval not displayed.     Hematology Recent Labs  Lab 09/18/19 0516  WBC 9.1  RBC 4.27  HGB 12.7  HCT 38.9  MCV 91.1  MCH 29.7  MCHC 32.6  RDW 14.7  PLT 269    BNP No results for input(s): BNP, PROBNP in the last 168 hours.   DDimer No results for input(s): DDIMER in the last 168 hours.   Radiology    No results found.  Cardiac Studies  Transesophageal echocardiogram 09/20/2019 1. Left ventricular ejection  fraction, by estimation, is 25 to 30%. The  left ventricle has severely decreased function. The left ventricle  demonstrates global hypokinesis. The left ventricular internal cavity size  was severely dilated.  2. Right ventricular systolic function is mildly reduced. The right  ventricular size is mildly enlarged.  3. There was a loosely formed thrombus in the body of the LA appendage.  Left atrial size was severely dilated.  4. Right atrial size was mildly dilated.  5. Tricuspid valve regurgitation is moderate to severe.  6. The mitral valve is normal in structure. Moderate to severe mitral  valve regurgitation likley related to MV leaflet teathering and annular  dilatation. THe MR wraps around the LA but does not appear to enter the  pulmonary vein by colorflow doppler but  there is intermittent pulmonary vein flow reversal with PW doppler.  7. The aortic valve is tricuspid. Aortic valve regurgitation is not  visualized. Mild aortic valve sclerosis is present, with no evidence of  aortic valve stenosis.  8. Evidence of atrial level shunting detected by color flow Doppler.  Agitated saline contrast bubble study was positive with shunting observed  within 3-6 cardiac cycles suggestive of interatrial shunt. There is a  small patent foramen ovale with  bidirectional shunting across atrial septum.  9. Mild to moderate aortic atherosclerosis is present in the ascending  and descending aorta. aorta.   Patient Profile     69 y.o. female with longstanding history of nonischemic cardiomyopathy, combined systolic and diastolic heart failure, LBBB, CRT hyper responder, biventricular ICD (generator change August 2019), history of hypokalemia related torsades de pointes and defibrillator shocks, insulin requiring diabetes mellitus, CKD stage III, paroxysmal (now persistent) atrial fibrillation, history of dysfunctional uterine bleeding (status post D&C and IUD a few months ago).   Assessment & Plan    1.CHF:  - still w/ excess volume by exam - on torsemide 50 mg bid, completed 5 days of therapy  - metolazone added on 03/13 & 03/15, may need again today, discuss w/ MD - BP limits adding RAAS inhibitors (  needs rated control BB more) and no ACE/ARB due to decreased renal function  - wt today 260 lbs, prev dry wt 250 lbs  2.  Acute on chronic renal insufficiency/hyponatremia:  - admit BUN/Cr 52/1.88, peak 78/2.63 - BUN not much change but Cr initially improved w/ diuresis, now trending up - Na nadir 123, got up to 128, now 127 - rec'd total 80 meq K+ 0n 03/15, K+ 4.9 today, Kdur d/c'd - continue to follow labs, will need to restart Kdur at some point - because of risk of vent arrhythmia, need to keep K+ >4  3. AFib:  - Coreg at 25 mg bid not controlling rate, cannot add any other med that lowers BP - if rate not slow, she does not CRT - if does not CRT, CHF gets worse - amio started 03/15 at 400 mg qd - unclear if nausea is related to amio or CBG swings, continue to follow - got dig toxic in the past, but considers herself allergic - Plan is to load amio and DCCV in 3-4 weeks - discuss if we need to load amio more slowly, could also try brief bolus/drip regimen  4. MR:  - may be functional 2nd LV dysfunction/dyssynchrony - per TEE report mod/severe - reassess after CRT and has time to recover  5. QT prolongation:  - noticeably longer on intrinsic beats, with wide LBBB, my measurement was 604 ms from ECG 03/11 - QT on paced beats on ECG 03/11 about the same as today - continue to follow    For questions or updates, please contact [place] Please consult [email or web address] for contact info under        Signed, Rosaria Ferries, PA-C  09/24/2019, 7:45 AM    I have seen and examined the patient along with Rosaria Ferries, PA.  I have reviewed the chart, notes and new data.  I agree with PA/NP's note.  Key new complaints: Sleeping now.  No longer  nauseous according to nurse.  For the first time her heart rate is in the 70s and there is intermittent biventricular pacing.  I did not wake her up. Key examination changes: Morbidly obese, irregular rhythm Key new findings / data: Slower rates on telemetry, at least while she is resting.  Normal potassium.  Magnesium is high.  Creatinine up to 2.1, BUN unchanged.  PLAN: We will increase the lower rate limit on the pacemaker to 80 bpm.  This should allow for more frequent cardiac resynchronization. I do not think the nausea is likely related to the first dose of amiodarone that she received.  Suspect it still left over from the initial process that led to her admission. Weight is going down, urine output is decent.  She is not really eating.  Creatinine a little worse.  I would hold off metolazone today.  Sanda Klein, MD, [place] [phone] 09/24/2019, 11:13 AM

## 2019-09-24 NOTE — Progress Notes (Addendum)
[facility] PROGRESS NOTE    Progress Note  Sara Walker  HTD:[id number] DOB: October 25, 1950 DOA: 09/16/2019 PCP: Deland Pretty, MD     Brief Narrative:   Sara Walker is an 69 y.o. female past medical history of breast cancer treated with Adriamycin and radiation, chronic combined systolic and diastolic heart failure with an EF of 20%, insulin-dependent diabetes mellitus type 2, with a history of polymorphic VT status post AICD presents to the emergency room with shortness of breath chest discomfort and A. fib with RVR.  She received her first dose of SARS-CoV-2 on 09/09/2019 20 after she started developing nausea generalized weakness shortness of breath chest discomfort and palpitation. In the ED she was found to be afebrile satting greater than 90%, her blood pressure was in the 100s, EKG showed A. fib with RVR with a heart rate of 125, chest x-ray showed pulmonary edema her sodium was 128, creatinine 1.8.  She was found in new onset A. fib with RVR on admission, started on diltiazem drip which has been transitioned to oral Coreg which is the maximum dose she will tolerate, patient has an Digoxin toxicity, chads Vasc of greater than 2, she was previously on anticoagulation but was hesitant to take it due to her recent vaginal bleed, she had a TTE that showed very poor ejection fraction with an EF of 15% and severe septum and lateral wall disc synchrony and paradoxical septal movement.  She underwent TEE once her heart rate was controlled which showed a thrombus in the left atrial appendage with severe dilated left ventricular function and an EF of 25%, mildly dilated RV with severe tricuspid regurgitation and mitral severe mitral regurgitation likely related to annular dilation and septal disc synchrony.  She is a poor candidate for open heart surgery.  TEE with cardioversion has to be attempted in 4 weeks and reevaluate her degree of mitral regurgitation when she is in sinus rhythm or she is rate  controlled. Assessment/Plan:   New onset Atrial fibrillation with RVR ([place]) PICC line was inserted on 09/21/2019, which shows a co-ox of 75. He is a poor candidate for open heart surgery, he might benefit from MitraClip. have D'cd IV Cardizem and now on Coreg 12.5 p.o. twice daily. She is now on oral Coreg max dose as her blood pressure will not tolerate any higher dose. Cardiology plans to make her back for cardioversion in 3 to 4 weeks.  Acute on chronic combined systolic and diastolic heart failure/with a history of nonischemic cardiomyopathy secondary to Adriamycin: With a BNP greater than 1000, chest x-ray with bilateral pulmonary infiltrates, with positive JVD and lower extremity edema on admission. She continues to be fluid overloaded on physical exam. She is now negative about 3-1/2 L but she is 12 pounds over her estimated dry weight. Nephrology recommended to continue torsemide. Her creatinine today has stabilized. Further management per cardiology. Question if we should consult capital care.  Acute kidney injury: With a baseline creatinine of less than 1, on admission 1.8.  She was started on IV Lasix and her creatinine has continued to trend up.  Once her hemodynamic proved specially her heart rate her creatinine started to improve. Renal recommended to restart her home dose of torsemide, she received a single dose of metolazone on 09/23/2019 and her creatinine trended up, she became hyponatremic. Her sodium and creatinine have slowly improved with only torsemide would recomend just continue on torsemide and continue gentle hydration. Further management per renal.  Hypervolemic hyponatremia:  She was given torsemide and metolazone and her sodium went the wrong way further management per renal.  Insulin-dependent diabetes mellitus type 2/hyperglycemia: Her insulin pump was discontinued.  Continue long-acting insulin per sliding scale will control blood glucose.  History of  VT: In 2016 she was noted to be having torsades in the setting of major electrolyte abnormalities and heart failure exacerbation. Monitoring in telemetry with no events.  Hyperlipidemia: Continue Repatha.  Hypokalemia: We will give her single dose orally. Check a magnesium level.  Constipation: She has had several BM, now resolved.  DVT prophylaxis: heparin Family Communication:none Disposition Plan/Barrier to D/C: Patient can be discharged once her heart rate and her creatinine has improved.  Code Status:     Code Status Orders  (From admission, onward)         Start     Ordered   09/17/19 0333  Full code  Continuous     09/17/19 0335        Code Status History    Date Active Date Inactive Code Status Order ID Comments User Context   04/04/2019 0636 04/04/2019 1149 Full Code [id number]  Everlene Farrier, MD Inpatient   02/23/2018 1448 02/23/2018 1904 Full Code [id number]  Sanda Klein, MD Inpatient   08/28/2013 1846 08/30/2013 1413 Full Code [id number]  Geradine Girt, DO Inpatient   Advance Care Planning Activity    Advance Directive Documentation     Most Recent Value  Type of Advance Directive  Healthcare Power of Attorney, Living will  Pre-existing out of facility DNR order (yellow form or pink MOST form)  --  "MOST" Form in Place?  --        IV Access:    Peripheral IV   Procedures and diagnostic studies:   No results found.   Medical Consultants:    None.  Anti-Infectives:   None  Subjective:    Sara Walker patient feels bad tired and fatigue.  Objective:    Vitals:   09/23/19 0858 09/23/19 1328 09/23/19 1941 09/24/19 0702  BP: 127/73 117/62 107/81 97/71  Pulse:  89 77 76  Resp:  20    Temp:  98 F (36.7 C) 98.1 F (36.7 C) (!) 97.5 F (36.4 C)  TempSrc:  Oral Oral Oral  SpO2:  97% 100% 98%  Weight:    117.9 kg  Height:       SpO2: 98 % O2 Flow Rate (L/min): 2 L/min   Intake/Output Summary (Last 24 hours) at 09/24/2019  0838 Last data filed at 09/23/2019 0900 Gross per 24 hour  Intake 240 ml  Output --  Net 240 ml   Filed Weights   09/22/19 0629 09/23/19 0529 09/24/19 0702  Weight: 118.6 kg 118.9 kg 117.9 kg    Exam: General exam: In no acute distress. Respiratory system: Good air movement and clear to auscultation. Cardiovascular system: S1 & S2 heard, RRR.  Positive JVD Gastrointestinal system: Abdomen is nondistended, soft and nontender.  Extremities: 3+ edema Skin: No rashes, lesions or ulcers Psychiatry: Judgement and insight appear normal. Mood & affect appropriate.  Data Reviewed:    Labs: Basic Metabolic Panel: Recent Labs  Lab 09/20/19 0004 09/20/19 0004 09/21/19 0259 09/21/19 0259 09/22/19 0434 09/22/19 0434 09/23/19 0329 09/24/19 0242  NA 123*  --  125*  --  125*  --  128* 127*  K 4.4   < > 4.5   < > 3.7   < > 3.6 4.9  CL 83*  --  85*  --  84*  --  84* 88*  CO2 26  --  26  --  27  --  28 26  GLUCOSE 180*  --  130*  --  144*  --  82 89  BUN 78*  --  79*  --  76*  --  74* 78*  CREATININE 2.63*  --  2.40*  --  2.01*  --  1.88* 2.12*  CALCIUM 9.1  --  9.0  --  9.0  --  9.6 9.6  MG 3.0*  --  3.0*  --  2.5*  --  2.7* 2.7*  PHOS  --   --   --   --  3.9  --  4.1 4.3   < > = values in this interval not displayed.   GFR Estimated Creatinine Clearance: 34.3 mL/min (A) (by C-G formula based on SCr of 2.12 mg/dL (H)). Liver Function Tests: Recent Labs  Lab 09/20/19 0004 09/22/19 0434 09/23/19 0329 09/24/19 0242  AST 70*  --   --   --   ALT 157*  --   --   --   ALKPHOS 64  --   --   --   BILITOT 1.1  --   --   --   PROT 5.8*  --   --   --   ALBUMIN 3.4* 3.3* 3.5 3.5   No results for input(s): LIPASE, AMYLASE in the last 168 hours. No results for input(s): AMMONIA in the last 168 hours. Coagulation profile Recent Labs  Lab 09/19/19 1601  INR 1.4*   COVID-19 Labs  No results for input(s): DDIMER, FERRITIN, LDH, CRP in the last 72 hours.  Lab Results  Component  Value Date   SARSCOV2NAA NEGATIVE 09/17/2019   [place] NEGATIVE 04/02/2019    CBC: Recent Labs  Lab 09/18/19 0516  WBC 9.1  HGB 12.7  HCT 38.9  MCV 91.1  PLT 269   Cardiac Enzymes: No results for input(s): CKTOTAL, CKMB, CKMBINDEX, TROPONINI in the last 168 hours. BNP (last 3 results) No results for input(s): PROBNP in the last 8760 hours. CBG: Recent Labs  Lab 09/23/19 1608 09/23/19 2128 09/23/19 2318 09/24/19 0253 09/24/19 0717  GLUCAP 225* 121* 89 80 89   D-Dimer: No results for input(s): DDIMER in the last 72 hours. Hgb A1c: No results for input(s): HGBA1C in the last 72 hours. Lipid Profile: No results for input(s): CHOL, HDL, LDLCALC, TRIG, CHOLHDL, LDLDIRECT in the last 72 hours. Thyroid function studies: No results for input(s): TSH, T4TOTAL, T3FREE, THYROIDAB in the last 72 hours.  Invalid input(s): FREET3 Anemia work up: No results for input(s): VITAMINB12, FOLATE, FERRITIN, TIBC, IRON, RETICCTPCT in the last 72 hours. Sepsis Labs: Recent Labs  Lab 09/18/19 0516  WBC 9.1   Microbiology Recent Results (from the past 240 hour(s))  SARS CORONAVIRUS 2 (TAT 6-24 HRS) Nasopharyngeal Nasopharyngeal Swab     Status: None   Collection Time: 09/17/19 12:04 AM   Specimen: Nasopharyngeal Swab  Result Value Ref Range Status   SARS Coronavirus 2 NEGATIVE NEGATIVE Final    Comment: (NOTE) SARS-CoV-2 target nucleic acids are NOT DETECTED. The SARS-CoV-2 RNA is generally detectable in upper and lower respiratory specimens during the acute phase of infection. Negative results do not preclude SARS-CoV-2 infection, do not rule out co-infections with other pathogens, and should not be used as the sole basis for treatment or other patient management decisions. Negative results must be combined with clinical observations, patient history, and  epidemiological information. The expected result is Negative. Fact Sheet for  Patients: SugarRoll.be Fact Sheet for Healthcare Providers: https://www.woods-mathews.com/ This test is not yet approved or cleared by the [place] FDA and  has been authorized for detection and/or diagnosis of SARS-CoV-2 by FDA under an Emergency Use Authorization (EUA). This EUA will remain  in effect (meaning this test can be used) for the duration of the COVID-19 declaration under Section 56 4(b)(1) of the Act, 21 U.S.C. section 360bbb-3(b)(1), unless the authorization is terminated or revoked sooner. Performed at [facility], [place] [address], [place], [place] [address]      Medications:   . allopurinol  450 mg Oral Daily  . amiodarone  400 mg Oral Daily  . apixaban  5 mg Oral BID  . carvedilol  25 mg Oral BID  . insulin aspart  0-5 Units Subcutaneous QHS  . insulin aspart  0-9 Units Subcutaneous TID WC  . insulin aspart  6 Units Subcutaneous TID WC  . insulin glargine  22 Units Subcutaneous BID  . Melatonin  6 mg Oral Once  . sodium chloride flush  3 mL Intravenous Q12H  . torsemide  50 mg Oral BID   Continuous Infusions:     LOS: 7 days   Charlynne Cousins  [facility]  09/24/2019, 8:38 AM

## 2019-09-24 NOTE — Plan of Care (Signed)
  Problem: Education: Goal: Knowledge of General Education information will improve Description: Including pain rating scale, medication(s)/side effects and non-pharmacologic comfort measures Outcome: Progressing   Problem: Health Behavior/Discharge Planning: Goal: Ability to manage health-related needs will improve Outcome: Progressing   Problem: Clinical Measurements: Goal: Ability to maintain clinical measurements within normal limits will improve Outcome: Progressing   Problem: Safety: Goal: Ability to remain free from injury will improve Outcome: Progressing   Elesa Hacker, RN

## 2019-09-25 LAB — GLUCOSE, CAPILLARY
Glucose-Capillary: 91 mg/dL (ref 70–99)
Glucose-Capillary: 102 mg/dL — ABNORMAL HIGH (ref 70–99)
Glucose-Capillary: 145 mg/dL — ABNORMAL HIGH (ref 70–99)
Glucose-Capillary: 186 mg/dL — ABNORMAL HIGH (ref 70–99)
Glucose-Capillary: 67 mg/dL — ABNORMAL LOW (ref 70–99)
Glucose-Capillary: 132 mg/dL — ABNORMAL HIGH (ref 70–99)

## 2019-09-25 LAB — RENAL FUNCTION PANEL
Albumin: 3.5 g/dL (ref 3.5–5.0)
Anion gap: 19 — ABNORMAL HIGH (ref 5–15)
BUN: 73 mg/dL — ABNORMAL HIGH (ref 8–23)
CO2: 28 mmol/L (ref 22–32)
Calcium: 9.9 mg/dL (ref 8.9–10.3)
Chloride: 84 mmol/L — ABNORMAL LOW (ref 98–111)
Creatinine, Ser: 1.81 mg/dL — ABNORMAL HIGH (ref 0.44–1.00)
GFR calc Af Amer: 33 mL/min — ABNORMAL LOW (ref >60)
GFR calc non Af Amer: 28 mL/min — ABNORMAL LOW (ref >60)
Glucose, Bld: 60 mg/dL — ABNORMAL LOW (ref 70–99)
Phosphorus: 4.4 mg/dL (ref 2.5–4.6)
Potassium: 3.2 mmol/L — ABNORMAL LOW (ref 3.5–5.1)
Sodium: 131 mmol/L — ABNORMAL LOW (ref 135–145)

## 2019-09-25 LAB — MAGNESIUM: Magnesium: 2.3 mg/dL (ref 1.7–2.4)

## 2019-09-25 MED ORDER — POTASSIUM CHLORIDE CRYS ER 20 MEQ PO TBCR
40.0000 meq | EXTENDED_RELEASE_TABLET | Freq: Every day | ORAL | Status: DC
  Administered 2019-09-25: 40 meq via ORAL
  Filled 2019-09-25: qty 2

## 2019-09-25 NOTE — Progress Notes (Signed)
PROGRESS NOTE  Sara Walker:[id number] DOB: 1951/02/17 DOA: 09/16/2019 PCP: Deland Pretty, MD   Brief summary;  Sara Walker is an 69 y.o. female past medical history of breast cancer treated with Adriamycin and radiation, chronic combined systolic and diastolic heart failure with an EF of 20%, insulin-dependent diabetes mellitus type 2, with a history of polymorphic VT status post AICD presents to the emergency room with shortness of breath chest discomfort and A. fib with RVR.  She received her first dose of SARS-CoV-2 on 09/09/2019 20 after she started developing nausea generalized weakness shortness of breath chest discomfort and palpitation. In the ED she was found to be afebrile satting greater than 90%, her blood pressure was in the 100s, EKG showed A. fib with RVR with a heart rate of 125, chest x-ray showed pulmonary edema her sodium was 128, creatinine 1.8.  She was found in new onset A. fib with RVR on admission, started on diltiazem drip which has been transitioned to oral Coreg which is the maximum dose she will tolerate, patient has an Digoxin toxicity, chads Vasc of greater than 2, she was previously on anticoagulation but was hesitant to take it due to her recent vaginal bleed, she had a TTE that showed very poor ejection fraction with an EF of 15% and severe septum and lateral wall disc synchrony and paradoxical septal movement.  She underwent TEE once her heart rate was controlled which showed a thrombus in the left atrial appendage with severe dilated left ventricular function and an EF of 25%, mildly dilated RV with severe tricuspid regurgitation and mitral severe mitral regurgitation likely related to annular dilation and septal disc synchrony.  She is a poor candidate for open heart surgery.  TEE with cardioversion has to be attempted in 4 weeks and reevaluate her degree of mitral regurgitation when she is in sinus rhythm or she is rate   HPI/Recap of past 24  hours:  Feeling better, on room air, denies chest pain, she ambulated with PT,  states feeling better, she is excited about the possibility of going home tomorrow 5liter urine output documented last 24 hours  Assessment/Plan: Principal Problem:   Atrial fibrillation with RVR ([place]) Active Problems:   Acute on chronic combined systolic and diastolic CHF (congestive heart failure) ([facility])   Acute on chronic systolic congestive heart failure ([facility])   AKI (acute kidney injury) ([place])   Hyponatremia   Hypoglycemia due to insulin   Pacemaker   Severe mitral insufficiency   Severe tricuspid regurgitation   Persistent atrial fibrillation ([place])  New diagnosis of A. fib RVR, history of BiV ICD -Cardiology input appreciated, patient is currently on Coreg 25 mg twice daily, started amiodarone 400 mg daily on March 15, plan for amiodarone loading with 400 mg daily for 2 weeks , then 200 mg daily  -Plan on DCCV on October 30, 2019 -Continue Eliquis 5 mg twice daily -Patient has history of digoxin toxicity -BivICD setting adjusted yesterday  Acute on chronic systolic heart failure History of nonischemic cardiomyopathy secondary to Adriamycin Last left ventricular EF 25 to 30% Status post BiV ICD Diuresing well on torsemide 50 mg twice daily  Hypervolemic hyponatremia -Improving on torsemide  AKI Likely cardiorenal syndrome Creatinine improving -Nephrology input appreciated, currently patient is on torsemide twice a day, metolazone management per nephrology  Hypokalemia, replace K  Insulin-dependent type 2 diabetes With hyperglycemia and hypoglycemia A.m. blood glucose 60, will decrease long-acting insulin Patient uses insulin pump at home, she  states we will plan to change her diet at discharge, likely she will need basal insulin adjustment , close follow-up with PCP for insulin pump management  DVT Prophylaxis:eliquis  Code Status: full  Family Communication: patient   Disposition  Plan:    Patient came from:                     Home                                                                                      Anticipated d/c place:  Home  Barriers to d/c OR conditions which need to be met to effect a safe d/c:  Needs cardiology and nephrology clearance, likely home tomorrow   Consultants:  Cardiology  Nephrology  Procedures:  None  Antibiotics:  None   Objective: BP 112/68   Pulse 80   Temp 97.8 F (36.6 C) (Oral)   Resp 17   Ht 5' 8" (1.727 m)   Wt 114.6 kg   SpO2 99%   BMI 38.41 kg/m   Intake/Output Summary (Last 24 hours) at 09/25/2019 1327 Last data filed at 09/25/2019 0953 Gross per 24 hour  Intake 363 ml  Output 5000 ml  Net -4637 ml   Filed Weights   09/23/19 0529 09/24/19 0702 09/25/19 0700  Weight: 118.9 kg 117.9 kg 114.6 kg    Exam: Patient is examined daily including today on 09/25/2019, exams remain the same as of yesterday except that has changed    General:  NAD, pleasant  Cardiovascular: Paced rhythm  Respiratory: Decreased at bases, nonlabored respirations  Abdomen: Soft/ND/NT, positive BS  Musculoskeletal: Bilateral lower extremity pitting edema   Neuro: alert, oriented   Data Reviewed: Basic Metabolic Panel: Recent Labs  Lab 09/21/19 0259 09/22/19 0434 09/23/19 0329 09/24/19 0242 09/25/19 0445  NA 125* 125* 128* 127* 131*  K 4.5 3.7 3.6 4.9 3.2*  CL 85* 84* 84* 88* 84*  CO2 _0 GLUCOSE 130* 144* 82 89 60*  BUN 79* 76* 74* 78* 73*  CREATININE 2.40* 2.01* 1.88* 2.12* 1.81*  CALCIUM 9.0 9.0 9.6 9.6 9.9  MG 3.0* 2.5* 2.7* 2.7* 2.3  PHOS  --  3.9 4.1 4.3 4.4   Liver Function Tests: Recent Labs  Lab 09/20/19 0004 09/22/19 0434 09/23/19 0329 09/24/19 0242 09/25/19 0445  AST 70*  --   --   --   --   ALT 157*  --   --   --   --   ALKPHOS 64  --   --   --   --   BILITOT 1.1  --   --   --   --   PROT 5.8*  --   --   --   --   ALBUMIN 3.4* 3.3* 3.5 3.5 3.5   No results for  input(s): LIPASE, AMYLASE in the last 168 hours. No results for input(s): AMMONIA in the last 168 hours. CBC: No results for input(s): WBC, NEUTROABS, HGB, HCT, MCV, PLT in the last 168 hours. Cardiac Enzymes:   No results for input(s): CKTOTAL, CKMB, CKMBINDEX, TROPONINI in the last  168 hours. BNP (last 3 results) Recent Labs    09/16/19 2349  BNP 1,100.9*    ProBNP (last 3 results) No results for input(s): PROBNP in the last 8760 hours.  CBG: Recent Labs  Lab 09/24/19 1128 09/24/19 1649 09/24/19 2231 09/25/19 0738 09/25/19 1128  GLUCAP 117* 171* 215* 91 102*    Recent Results (from the past 240 hour(s))  SARS CORONAVIRUS 2 (TAT 6-24 HRS) Nasopharyngeal Nasopharyngeal Swab     Status: None   Collection Time: 09/17/19 12:04 AM   Specimen: Nasopharyngeal Swab  Result Value Ref Range Status   SARS Coronavirus 2 NEGATIVE NEGATIVE Final    Comment: (NOTE) SARS-CoV-2 target nucleic acids are NOT DETECTED. The SARS-CoV-2 RNA is generally detectable in upper and lower respiratory specimens during the acute phase of infection. Negative results do not preclude SARS-CoV-2 infection, do not rule out co-infections with other pathogens, and should not be used as the sole basis for treatment or other patient management decisions. Negative results must be combined with clinical observations, patient history, and epidemiological information. The expected result is Negative. Fact Sheet for Patients: SugarRoll.be Fact Sheet for Healthcare Providers: https://www.woods-mathews.com/ This test is not yet approved or cleared by the [place] FDA and  has been authorized for detection and/or diagnosis of SARS-CoV-2 by FDA under an Emergency Use Authorization (EUA). This EUA will remain  in effect (meaning this test can be used) for the duration of the COVID-19 declaration under Section 56 4(b)(1) of the Act, 21 U.S.C. section 360bbb-3(b)(1),  unless the authorization is terminated or revoked sooner. Performed at [facility], [place] [address], [place], [place] [address]      Studies: No results found.  Scheduled Meds: . allopurinol  450 mg Oral Daily  . amiodarone  400 mg Oral Daily  . apixaban  5 mg Oral BID  . carvedilol  25 mg Oral BID  . insulin aspart  0-5 Units Subcutaneous QHS  . insulin aspart  0-9 Units Subcutaneous TID WC  . insulin aspart  6 Units Subcutaneous TID WC  . insulin glargine  22 Units Subcutaneous BID  . Melatonin  6 mg Oral Once  . potassium chloride  40 mEq Oral Daily  . sodium chloride flush  3 mL Intravenous Q12H  . torsemide  50 mg Oral BID    Continuous Infusions:   Time spent: 28mns I have personally reviewed and interpreted on  09/25/2019 daily labs, tele strips, imagings as discussed above under date review session and assessment and plans.  I reviewed all nursing notes, pharmacy notes, consultant notes,  vitals, pertinent old records  I have discussed plan of care as described above with RN , patient on 09/25/2019   FFlorencia ReasonsMD, PhD, FACP  [facility]  Available via Epic secure chat 7am-7pm for nonurgent issues Please page for urgent issues, pager number available through [email or web address] .   09/25/2019, 1:27 PM  LOS: 8 days

## 2019-09-25 NOTE — Progress Notes (Addendum)
Progress Note  Patient Name: Sara Walker Date of Encounter: 09/25/2019  Primary Cardiologist: Sanda Klein, MD   Subjective   She is no longer nauseous and slept well last night. She was found supine with legs elevated above her head.  Her main complaints are about the AM blood draws and our food.   Inpatient Medications    Scheduled Meds: . allopurinol  450 mg Oral Daily  . amiodarone  400 mg Oral Daily  . apixaban  5 mg Oral BID  . carvedilol  25 mg Oral BID  . insulin aspart  0-5 Units Subcutaneous QHS  . insulin aspart  0-9 Units Subcutaneous TID WC  . insulin aspart  6 Units Subcutaneous TID WC  . insulin glargine  22 Units Subcutaneous BID  . Melatonin  6 mg Oral Once  . sodium chloride flush  3 mL Intravenous Q12H  . torsemide  50 mg Oral BID   Continuous Infusions:  PRN Meds: acetaminophen **OR** acetaminophen, bisacodyl, HYDROcodone-acetaminophen, ondansetron **OR** ondansetron (ZOFRAN) IV, zolpidem   Vital Signs    Vitals:   09/24/19 1519 09/24/19 2234 09/25/19 0700 09/25/19 0952  BP: 110/60 (!) 107/93 114/62 112/68  Pulse: 80 80 80   Resp:  20 17   Temp: 97.8 F (36.6 C) 97.7 F (36.5 C) 97.8 F (36.6 C)   TempSrc: Oral Oral Oral   SpO2: 99% 100% 99%   Weight:   114.6 kg   Height:        Intake/Output Summary (Last 24 hours) at 09/25/2019 1107 Last data filed at 09/25/2019 0953 Gross per 24 hour  Intake 603 ml  Output 5300 ml  Net -4697 ml   Last 3 Weights 09/25/2019 09/24/2019 09/23/2019  Weight (lbs) 252 lb 10.4 oz 260 lb 262 lb 3.2 oz  Weight (kg) 114.6 kg 117.935 kg 118.933 kg      Telemetry    Paced rhythm - Personally Reviewed  ECG    No new tracings - Personally Reviewed  Physical Exam   GEN: No acute distress.   Neck: No JVD Cardiac: RRR, no murmurs, rubs, or gallops.  Respiratory: respirations unlabored, improved in bases GI: Soft, nontender, non-distended  MS: at least 1+ B LE edema. Neuro:  Nonfocal  Psych: Normal  affect   Labs    High Sensitivity Troponin:   Recent Labs  Lab 09/16/19 2349 09/17/19 0149  TROPONINIHS 39* 38*      Chemistry Recent Labs  Lab 09/20/19 0004 09/21/19 0259 09/23/19 0329 09/24/19 0242 09/25/19 0445  NA 123*   < > 128* 127* 131*  K 4.4   < > 3.6 4.9 3.2*  CL 83*   < > 84* 88* 84*  CO2 26   < > 28 26 28   GLUCOSE 180*   < > 82 89 60*  BUN 78*   < > 74* 78* 73*  CREATININE 2.63*   < > 1.88* 2.12* 1.81*  CALCIUM 9.1   < > 9.6 9.6 9.9  PROT 5.8*  --   --   --   --   ALBUMIN 3.4*   < > 3.5 3.5 3.5  AST 70*  --   --   --   --   ALT 157*  --   --   --   --   ALKPHOS 64  --   --   --   --   BILITOT 1.1  --   --   --   --  GFRNONAA 18*   < > 27* 23* 28*  GFRAA 21*   < > 31* 27* 33*  ANIONGAP 14   < > 16* 13 19*   < > = values in this interval not displayed.     HematologyNo results for input(s): WBC, RBC, HGB, HCT, MCV, MCH, MCHC, RDW, PLT in the last 168 hours.  BNPNo results for input(s): BNP, PROBNP in the last 168 hours.   DDimer No results for input(s): DDIMER in the last 168 hours.   Radiology    No results found.  Cardiac Studies   Transesophageal echocardiogram 09/20/2019 1. Left ventricular ejection fraction, by estimation, is 25 to 30%. The  left ventricle has severely decreased function. The left ventricle  demonstrates global hypokinesis. The left ventricular internal cavity size  was severely dilated.  2. Right ventricular systolic function is mildly reduced. The right  ventricular size is mildly enlarged.  3. There was a loosely formed thrombus in the body of the LA appendage.  Left atrial size was severely dilated.  4. Right atrial size was mildly dilated.  5. Tricuspid valve regurgitation is moderate to severe.  6. The mitral valve is normal in structure. Moderate to severe mitral  valve regurgitation likley related to MV leaflet teathering and annular  dilatation. THe MR wraps around the LA but does not appear to enter the    pulmonary vein by colorflow doppler but  there is intermittent pulmonary vein flow reversal with PW doppler.  7. The aortic valve is tricuspid. Aortic valve regurgitation is not  visualized. Mild aortic valve sclerosis is present, with no evidence of  aortic valve stenosis.  8. Evidence of atrial level shunting detected by color flow Doppler.  Agitated saline contrast bubble study was positive with shunting observed  within 3-6 cardiac cycles suggestive of interatrial shunt. There is a  small patent foramen ovale with  bidirectional shunting across atrial septum.  9. Mild to moderate aortic atherosclerosis is present in the ascending  and descending aorta. aorta.   Patient Profile     69 y.o. female with longstanding history of nonischemic cardiomyopathy, combined systolic and diastolic heart failure, LBBB, CRT hyper responder, biventricular ICD (generator change August 2019), history of hypokalemia related torsades de pointes and defibrillator shocks, insulin requiring diabetes mellitus, CKD stage III, paroxysmal (now persistent) atrial fibrillation, history of dysfunctional uterine bleeding (status post D&C and IUD a few months ago).  Assessment & Plan    1. Acute on chronic systolic heart failure - nonischemic cardiomyopathy   - EF this admission 25-30% - diuresing on torsemide 50 mg BID - dry weight thought to be 250 lbs, weight is now 252 lbs, down from 263 on admission - 5L urine output yesterday, now overall net negative 8L - sCr 1.81 (2.12) with K 3.2 - replace potassium - she is diuresing very well, will not give metolazone today (has received 2 doses this hospitalization)  - likely need to monitor one more day  - encouraged compression socks, she states they make her nauseous   2. Acute on chronic renal insufficiency 3. Hypokalemia 4. Hyponatremia - sCr 1.81 (2.12) - K 3.2 - gentle replacement for target 4.0 - Mg 2.3 - Na improving to 131 - appreciate nephrology  input   5. PAF - coreg 25 mg BID with poor rate control - amiodarone 400 mg daily added 3/15 - hx of digoxin toxicity - plan for amiodarone load x 3-4 weeks then DCCV - anticoagulated with 5 mg  eliquis BID - she has been nauseous - low suspicion this is due to amiodarone - now resolved - increased lower rate limit on PPM to 80 bpm to allow for more frequent cardiac resynchronization   6. Nausea - consider low BG as etiology for this         For questions or updates, please contact [place] Please consult [email or web address] for contact info under        Signed, Ledora Bottcher, PA  09/25/2019, 11:07 AM     I have seen and examined the patient along with Ledora Bottcher, PA .  I have reviewed the chart, notes and new data.  I agree with PA/NP's note.  Key new complaints: feeling better, smiling a lot. Having her period. Key examination changes: less edema. Excellent UO after changes in meds and device settings. Net diuresis 8L, weight down 11 lb, almost at tart=get dry weight (<250 lb). Key new findings / data: Now almost 100% V paced at 80 bpm (amiodarone leading to lower rate of AV conduction and device lower rate limit increased to 80).  PLAN: Likely ready for DC in AM. Plan to continue amiodarone load (400 mg daily for 2 weeks), then 200 mg daily. Eliquis 5 mg twice daily, uninterrupted for a month before cardioversion.  Plan DCCV on October 30, 2019. Schedule office appt about a week before that (will need repeat labs and COVID screening).  Sanda Klein, MD, [place] [phone] 09/25/2019, 11:31 AM

## 2019-09-25 NOTE — Progress Notes (Signed)
Physical Therapy Treatment Patient Details Name: Sara Walker MRN: [id number] DOB: [birth year] Today's Date: 09/25/2019    History of Present Illness Patient is a 69 y/o female who presents with SOB and chest pain. Admitted with new onset A-fib with RVR. s/p TEE showing thrombus in LAA. PMH includes severe nonischemic cardiomyopathy, LBBB, HTN, HLD, breast ca, defibrillator, depression, CAD, CKD,  DM, COPD, CHF,    PT Comments    Pt making great progress with mobility. Still limited by activity tolerance but 02 sats remain in 90s with ambulation up to 250ft. Pt able to move about room with mod I, ambulated in hall approx 28ft with no AD and stand by assist, needed 2 standing rest breaks as became winded and showed visible increased work of breathing. Physician asked for 02 sats taken when ambulating, even with c/o windedness and noted increased work of breathing pt sats betwen 96-98% HR between 72-85bpm.     Follow Up Recommendations  No PT follow up     Equipment Recommendations  None recommended by PT    Recommendations for Other Services       Precautions / Restrictions Precautions Precautions: Fall Restrictions Weight Bearing Restrictions: No    Mobility  Bed Mobility               General bed mobility comments: pt sitting edge of bed at therapist arrival  Transfers Overall transfer level: Modified independent Equipment used: None                Ambulation/Gait Ambulation/Gait assistance: Supervision Gait Distance (Feet): 250 Feet Assistive device: None Gait Pattern/deviations: WFL(Within Functional Limits) Gait velocity: fair   General Gait Details: pt did well but becomes winded quickly needing standing rest break, physician asked for 02 sats taken when ambulatin, even with c/o windedness and noted increased work of breathing pt sats betwen 96-98% HR between 72-85bpm   Stairs             Wheelchair Mobility    Modified Rankin (Stroke  Patients Only)       Balance Overall balance assessment: Mild deficits observed, not formally tested;Modified Independent                                          Cognition Arousal/Alertness: Awake/alert Behavior During Therapy: WFL for tasks assessed/performed Overall Cognitive Status: Within Functional Limits for tasks assessed                                        Exercises      General Comments        Pertinent Vitals/Pain Pain Assessment: No/denies pain    Home Living                      Prior Function            PT Goals (current goals can now be found in the care plan section) Acute Rehab PT Goals Patient Stated Goal: ready to go home PT Goal Formulation: With patient Time For Goal Achievement: 10/04/19 Potential to Achieve Goals: Good Progress towards PT goals: Progressing toward goals    Frequency    Min 3X/week      PT Plan Current plan remains appropriate    Co-evaluation  AM-PAC PT "6 Clicks" Mobility   Outcome Measure  Help needed turning from your back to your side while in a flat bed without using bedrails?: None Help needed moving from lying on your back to sitting on the side of a flat bed without using bedrails?: None Help needed moving to and from a bed to a chair (including a wheelchair)?: None Help needed standing up from a chair using your arms (e.g., wheelchair or bedside chair)?: None Help needed to walk in hospital room?: A Little Help needed climbing 3-5 steps with a railing? : A Little 6 Click Score: 22    End of Session   Activity Tolerance: Patient tolerated treatment well;Patient limited by fatigue Patient left: in bed;with call bell/phone within reach;with nursing/sitter in room Nurse Communication: Other (comment)(nurse in room towards end of session) PT Visit Diagnosis: Muscle weakness (generalized) (M62.81);Difficulty in walking, not elsewhere classified  (R26.2);Pain     Time: 1497-0263 PT Time Calculation (min) (ACUTE ONLY): 27 min  Charges:  $Gait Training: 8-22 mins $Therapeutic Activity: 8-22 mins                     Horald Chestnut, PT    Delford Field 09/25/2019, 2:58 PM

## 2019-09-25 NOTE — Progress Notes (Signed)
Nephrology Progress Note:  Subjective:   She had 5.1 liters UOP over 3/17 documented as well as one unmeasured void.  Has been on torsemide 50 mg BID here and last got metolazone on 3/15.  She states "I feel great - they think I can go home tomorrow".  I offered a nephrology appt but she would prefer to follow-up with cardiology and PCP and then come to see nephrology in clinic if needed.  "I'm getting better"  Takes atypical torsemide regimen at home meant to optimize metolazone function- torsemide 50 mg at noon, metolazone when she takes it, and then 2pm takes torsemide 50 mg 2nd dose  - states has been on this regimen several years.    Review of systems:   denies shortness of breath On her period - has an IUD  Increased UOP on 3/16 No n/v - tolerating PO Ambulated in room   Objective Vital signs in last 24 hours: Vitals:   09/24/19 1519 09/24/19 2234 09/25/19 0700 09/25/19 0952  BP: 110/60 (!) 107/93 114/62 112/68  Pulse: 80 80 80   Resp:  20 17   Temp: 97.8 F (36.6 C) 97.7 F (36.5 C) 97.8 F (36.6 C)   TempSrc: Oral Oral Oral   SpO2: 99% 100% 99%   Weight:   114.6 kg   Height:       Weight change:   Intake/Output Summary (Last 24 hours) at 09/25/2019 1303 Last data filed at 09/25/2019 8101 Gross per 24 hour  Intake 363 ml  Output 5000 ml  Net -4637 ml    Assessment/ Plan: Pt is a 69 y.o. yo female with HTN, CHF and Afib who was admitted on 09/16/2019 with Afib with RVR and volume overload-  Baseline crt around 1-  Has developed AKI   Assessment/Plan: 1. AKI-  U/A is bland.  Appears to be secondary to ischemic insults with hypotension.  BL Cr around 1 but pt states has been told of low kidney function before.  (Cr 0.92 in 03/2019 and per consult note indicates Cr 0.9 - 1.0 in 08/2019).  Improving with supportive care  - continue supportive care.       2. HTN -  Controlled.  Volume overload and hypotension are improving   3. Acute on chronic systolic CHF  - Note has  been on 50 mg BID here and takes atypical regimen at home - 50 mg at noon, metolazone when takes, and then 2pm takes torsemide 50 mg 2nd dose.  Metolazone 5 mg on 3/15    - Continue torsemide 50 mg BID - Defer metolazone today   4. Hyponatremia-  hypervolemic hyponatremia - urine osm is appropriately low.  Should be treated with loop diuretic.  Improving.    - Continue torsemide   5.  Afib-  S/p cardiazem. On amio.  Per cardiology.  With pacer  6.  Hypokalemia-  On potassium supplementation with 40 meq daily at home.  Resume home supplement of potassium 40 meq daily   Dispo - will sign off.  Please do not hesitate to contact us with any questions.  I offered her nephrology follow-up and she declines for now.  She does need to see her PCP for labs within a week of discharge and to follow-up with cardiology.   We are happy to see her in clinic if needed.    Labs: Basic Metabolic Panel: Recent Labs  Lab 09/23/19 0329 09/24/19 0242 09/25/19 0445  NA 128* 127* 131*  K 3.6 4.9  3.2*  CL 84* 88* 84*  CO2 28 26 28   GLUCOSE 82 89 60*  BUN 74* 78* 73*  CREATININE 1.88* 2.12* 1.81*  CALCIUM 9.6 9.6 9.9  PHOS 4.1 4.3 4.4   Liver Function Tests: Recent Labs  Lab 09/20/19 0004 09/22/19 0434 09/23/19 0329 09/24/19 0242 09/25/19 0445  AST 70*  --   --   --   --   ALT 157*  --   --   --   --   ALKPHOS 64  --   --   --   --   BILITOT 1.1  --   --   --   --   PROT 5.8*  --   --   --   --   ALBUMIN 3.4*   < > 3.5 3.5 3.5   < > = values in this interval not displayed.   CBC: No results for input(s): WBC, NEUTROABS, HGB, HCT, MCV, PLT in the last 168 hours. Cardiac Enzymes: No results for input(s): CKTOTAL, CKMB, CKMBINDEX, TROPONINI in the last 168 hours. CBG: Recent Labs  Lab 09/24/19 1128 09/24/19 1649 09/24/19 2231 09/25/19 0738 09/25/19 1128  GLUCAP 117* 171* 215* 91 102*    Iron Studies: No results for input(s): IRON, TIBC, TRANSFERRIN, FERRITIN in the last 72  hours. Studies/Results: No results found. Medications: Infusions:   Scheduled Medications: . allopurinol  450 mg Oral Daily  . amiodarone  400 mg Oral Daily  . apixaban  5 mg Oral BID  . carvedilol  25 mg Oral BID  . insulin aspart  0-5 Units Subcutaneous QHS  . insulin aspart  0-9 Units Subcutaneous TID WC  . insulin aspart  6 Units Subcutaneous TID WC  . insulin glargine  22 Units Subcutaneous BID  . Melatonin  6 mg Oral Once  . sodium chloride flush  3 mL Intravenous Q12H  . torsemide  50 mg Oral BID    have reviewed scheduled and prn medications.  Physical Exam:      General: adult female sitting on side of bed  HEENT NCAT EOMI sclera anicteric Heart: S1S2; no rub Lungs: basilar crackles; unlabored at rest on room air  Abdomen: obese habitus/soft nontender Extremities: 1-2+ pitting edema   Psych normal mood and affect Neuro - alert and oriented x 3;provides a hx and follows commands   Claudia Desanctis, MD  09/25/2019, 1:25 PM   LOS: 8 days

## 2019-09-26 ENCOUNTER — Other Ambulatory Visit: Payer: Self-pay

## 2019-09-26 LAB — GLUCOSE, CAPILLARY
Glucose-Capillary: 64 mg/dL — ABNORMAL LOW (ref 70–99)
Glucose-Capillary: 111 mg/dL — ABNORMAL HIGH (ref 70–99)
Glucose-Capillary: 112 mg/dL — ABNORMAL HIGH (ref 70–99)

## 2019-09-26 MED ORDER — POTASSIUM CHLORIDE CRYS ER 20 MEQ PO TBCR
40.0000 meq | EXTENDED_RELEASE_TABLET | Freq: Two times a day (BID) | ORAL | Status: DC
  Administered 2019-09-26: 40 meq via ORAL
  Filled 2019-09-26: qty 2

## 2019-09-26 MED ORDER — APIXABAN 5 MG PO TABS: 5.0000 mg | ORAL_TABLET | Freq: Two times a day (BID) | ORAL | 0 refills | Status: DC

## 2019-09-26 MED ORDER — TORSEMIDE 10 MG PO TABS: 50.0000 mg | ORAL_TABLET | Freq: Every day | ORAL | 0 refills | Status: DC

## 2019-09-26 MED ORDER — AMIODARONE HCL 200 MG PO TABS: 200.0000 mg | ORAL_TABLET | Freq: Every day | ORAL | 0 refills | Status: DC

## 2019-09-26 MED ORDER — TORSEMIDE 100 MG PO TABS: 50.0000 mg | ORAL_TABLET | Freq: Every day | ORAL | 0 refills | Status: DC

## 2019-09-26 MED ORDER — POTASSIUM CHLORIDE CRYS ER 20 MEQ PO TBCR: 40.0000 meq | EXTENDED_RELEASE_TABLET | Freq: Two times a day (BID) | ORAL | 0 refills | Status: AC

## 2019-09-26 MED ORDER — TORSEMIDE 20 MG PO TABS: 50.0000 mg | ORAL_TABLET | Freq: Every day | ORAL | Status: DC

## 2019-09-26 MED ORDER — AMIODARONE HCL 400 MG PO TABS: 400.0000 mg | ORAL_TABLET | Freq: Every day | ORAL | 0 refills | Status: DC

## 2019-09-26 MED ORDER — POTASSIUM CHLORIDE CRYS ER 20 MEQ PO TBCR: 40.0000 meq | EXTENDED_RELEASE_TABLET | Freq: Two times a day (BID) | ORAL | 0 refills | Status: DC

## 2019-09-26 MED FILL — AMIODARONE HCL 200 MG TAB: 200 | 30 days supply | Qty: 44 | Fill #0

## 2019-09-26 MED FILL — POTASSIUM CHLORIDE 20meqER: 20 | 30 days supply | Qty: 120 | Fill #0

## 2019-09-26 MED FILL — TORSEMIDE 100 MG TABLET: 100 | 30 days supply | Qty: 30 | Fill #0

## 2019-09-26 NOTE — Consult Note (Addendum)
   [facility] CM Inpatient Consult   09/26/2019  Sara Walker 1950/11/17 [id number]   Patient in the Medicare Next Gen and screened for post hospital follow up.  [name] a 69 y.o.femalewith medical history significant forremote breast cancer treated with Adriamycin and radiation, chronic combined CHF with EF 20 to 25%, insulin-dependent diabetes mellitus, and history of polymorphic VT status post ICD, now presenting to the emergency department with shortness of breath, chest discomfort, and atrial fibrillation with rapid rate. Patient reports that she received the first dose of her Covid vaccination on 09/09/2019 and shortly after developed nausea, general malaise, shortness of breath, orthopnea, palpitations, and chest discomfort. She received a call from her cardiology clinic reporting that she had prolonged episodes of rapid A. fib detected.   Patient [place] area and follow up for post hospital transition for long length of stay follow up.  Will have follow up with [facility].  Natividad Brood, RN BSN [facility] Liaison  [phone] business mobile phone Toll free office [phone]  Fax number: [phone] [email or web address]

## 2019-09-26 NOTE — Progress Notes (Addendum)
Progress Note  Patient Name: Sara Walker Date of Encounter: 09/26/2019  Primary Cardiologist: Sanda Klein, MD   Subjective   Pt is ready for discharge prior to storms today.   Inpatient Medications    Scheduled Meds: . allopurinol  450 mg Oral Daily  . amiodarone  400 mg Oral Daily  . apixaban  5 mg Oral BID  . carvedilol  25 mg Oral BID  . insulin aspart  0-5 Units Subcutaneous QHS  . insulin aspart  0-9 Units Subcutaneous TID WC  . insulin aspart  6 Units Subcutaneous TID WC  . insulin glargine  22 Units Subcutaneous BID  . Melatonin  6 mg Oral Once  . potassium chloride  40 mEq Oral Daily  . sodium chloride flush  3 mL Intravenous Q12H  . torsemide  50 mg Oral BID   Continuous Infusions:  PRN Meds: acetaminophen **OR** acetaminophen, bisacodyl, HYDROcodone-acetaminophen, ondansetron **OR** ondansetron (ZOFRAN) IV, zolpidem   Vital Signs    Vitals:   09/25/19 1418 09/25/19 2139 09/26/19 0427 09/26/19 0653  BP: (!) 82/58 (!) 92/56  96/62  Pulse: 80 81  81  Resp: 15     Temp: 98.5 F (36.9 C) 98.2 F (36.8 C)  (!) 97.5 F (36.4 C)  TempSrc: Oral Oral  Oral  SpO2: 96% 95%  95%  Weight:   113.6 kg   Height:   5\' 8"  (1.727 m)     Intake/Output Summary (Last 24 hours) at 09/26/2019 0811 Last data filed at 09/26/2019 0810 Gross per 24 hour  Intake 1803 ml  Output 3500 ml  Net -1697 ml   Last 3 Weights 09/26/2019 09/25/2019 09/24/2019  Weight (lbs) 250 lb 8 oz 252 lb 10.4 oz 260 lb  Weight (kg) 113.626 kg 114.6 kg 117.935 kg      Telemetry    Primarily paced rhythm in the 80s - Personally Reviewed  ECG    No new tracings - Personally Reviewed  Physical Exam   GEN: obese female no acute distress.   Neck: No JVD Cardiac: RRR, no murmurs, rubs, or gallops.  Respiratory: Clear to auscultation bilaterally. GI: Soft, nontender, non-distended  MS: 1+ B LE edema; No deformity. Neuro:  Nonfocal  Psych: Normal affect   Labs    High Sensitivity  Troponin:   Recent Labs  Lab 09/16/19 2349 09/17/19 0149  TROPONINIHS 39* 38*      Chemistry Recent Labs  Lab 09/20/19 0004 09/21/19 0259 09/23/19 0329 09/24/19 0242 09/25/19 0445  NA 123*   < > 128* 127* 131*  K 4.4   < > 3.6 4.9 3.2*  CL 83*   < > 84* 88* 84*  CO2 26   < > 28 26 28   GLUCOSE 180*   < > 82 89 60*  BUN 78*   < > 74* 78* 73*  CREATININE 2.63*   < > 1.88* 2.12* 1.81*  CALCIUM 9.1   < > 9.6 9.6 9.9  PROT 5.8*  --   --   --   --   ALBUMIN 3.4*   < > 3.5 3.5 3.5  AST 70*  --   --   --   --   ALT 157*  --   --   --   --   ALKPHOS 64  --   --   --   --   BILITOT 1.1  --   --   --   --   GFRNONAA 18*   < >  27* 23* 28*  GFRAA 21*   < > 31* 27* 33*  ANIONGAP 14   < > 16* 13 19*   < > = values in this interval not displayed.     HematologyNo results for input(s): WBC, RBC, HGB, HCT, MCV, MCH, MCHC, RDW, PLT in the last 168 hours.  BNPNo results for input(s): BNP, PROBNP in the last 168 hours.   DDimer No results for input(s): DDIMER in the last 168 hours.   Radiology    No results found.  Cardiac Studies   Transesophageal echocardiogram 09/20/2019 1. Left ventricular ejection fraction, by estimation, is 25 to 30%. The  left ventricle has severely decreased function. The left ventricle  demonstrates global hypokinesis. The left ventricular internal cavity size  was severely dilated.  2. Right ventricular systolic function is mildly reduced. The right  ventricular size is mildly enlarged.  3. There was a loosely formed thrombus in the body of the LA appendage.  Left atrial size was severely dilated.  4. Right atrial size was mildly dilated.  5. Tricuspid valve regurgitation is moderate to severe.  6. The mitral valve is normal in structure. Moderate to severe mitral  valve regurgitation likley related to MV leaflet teathering and annular  dilatation. THe MR wraps around the LA but does not appear to enter the  pulmonary vein by colorflow doppler  but  there is intermittent pulmonary vein flow reversal with PW doppler.  7. The aortic valve is tricuspid. Aortic valve regurgitation is not  visualized. Mild aortic valve sclerosis is present, with no evidence of  aortic valve stenosis.  8. Evidence of atrial level shunting detected by color flow Doppler.  Agitated saline contrast bubble study was positive with shunting observed  within 3-6 cardiac cycles suggestive of interatrial shunt. There is a  small patent foramen ovale with  bidirectional shunting across atrial septum.  9. Mild to moderate aortic atherosclerosis is present in the ascending  and descending aorta. aorta.   Patient Profile     69 y.o. female with longstanding history of nonischemic cardiomyopathy, combined systolic and diastolic heart failure, LBBB, CRT hyper responder, biventricular ICD (generator change August 2019), history of hypokalemia related torsades de pointes and defibrillator shocks, insulin requiring diabetes mellitus, CKD stage III, paroxysmal (now persistent) atrial fibrillation, history of dysfunctional uterine bleeding (status post D&C and IUD a few months ago).  Assessment & Plan    Acute on chronic systolic heart failure Nonischemic cardiomyopathy - anthracycline Biventricular ICD - EF this admission was 25-30% - diuresing on her home regimen of 50 mg torsemide - dry weight thought to be 250 lbs - weight is now 250 lbs, down from 263 lbs on admission - 1.5 L urine output yesterday, now overall net negative 7.6 L - Continue coreg 25 mg BID - increased lower rate limit on PPM to 80 bpm to allow for more frequent cardiac resynchronization, which led to significant diuresis - less diuresis overnight, but down 2 lbs - home diuretic regimen is 50 mg torsemide at noon, +/- metolazone, second dose of torsemide at 2pm - does not tolerate compression stockings - will defer decisions regarding K supplement at discharge to Dr.  Sallyanne Kuster   Hypotension Pressures have been marginal this admission. No room to titrate heart failure medications.  Home spiro has not been restarted.   Atrial fibrillation Chronic anticoagulation Hx of digoxin toxicity.  Will arrange for DCCV on April 21 with Dr. Sallyanne Kuster. Obtain BMP and CBC at follow up  visit with me on 4/13. Will discharge on 400 mg amiodarone daily x 2 weeks, then 200 mg daily thereafter. Please discharge with 200 mg tablets.  Continue anticoagulation with eliquis BID.  This patients CHA2DS2-VASc Score and unadjusted Ischemic Stroke Rate (% per year) is equal to 4.8 % stroke rate/year from a score of 46 (age, female, CHF, DM) - expressed importance of not missing a dose prior to DCCV   CKD stage III Hypokalemia sCr 1.81 prior to discharge. Baseline appears to be near 1. Creatinine increased to 2.63 on 3/12 before trending down. K replaced - 40 mEq x 2 doses.    Nausea Resolved. Question hypoglycemia. Was again hypoglycemic overnight. Resume insulin pump at discharge.   Please discharge with meds to [facility] pharmacy due to weather today. Patient lives 1.5 hrs away.   Cardiology follow up has been arranged.      For questions or updates, please contact [facility] Please consult [email or web address] for contact info under        Signed, Ledora Bottcher, PA  09/26/2019, 8:11 AM   I have seen and examined the patient along with Ledora Bottcher, PA .  I have reviewed the chart, notes and new data.  I agree with PA/NP's note.  Key new complaints: feels much better, walked the whole unit length Key examination changes: edema is less, not all gone Key new findings / data: fair percentage of CRT on telemetry  PLAN: She refused labs today. DC home with early follow up and labs. DCCV after 1 month of uninterrupted anticoagulation.  Sanda Klein, MD, [place] [phone] 09/26/2019, 9:59 AM   [facility] will sign off.   Medication  Recommendations:   - Apixaban 5 mg twice daily - Torsemide 50 mg ONCE daily. Take TWICE daily if weight over 255 lb. - KCl 40 mEq twice daily - Amiodarone 400 mg daily until April 01, then 200 mg daily - Carvedilol 25 mg twice daily (unchanged) Will hold off spironolactone due to low BP, plan to restart at appt next week. Other recommendations (labs, testing, etc):  Repeat BMET at follow up next week. Daily weights and bring weight log to appt. Follow up as an outpatient:  1 week [facility]  Sanda Klein, MD, [facility] [phone] office [phone] pager 09/26/2019 9:45 AM

## 2019-09-26 NOTE — Discharge Summary (Signed)
Discharge Summary  [name] DXA:[id number] DOB: 11-18-1950  PCP: Deland Pretty, MD  Admit date: 09/16/2019 Discharge date: 09/26/2019  Time spent: 35mins, more than 50% time spent on coordination of care.  Case discussed with cardiology.  Recommendations for Outpatient Follow-up:  1. F/u with PCP within a week  for hospital discharge follow up, repeat cbc/bmp at follow up.  PCP to manage insulin pump setting 2. Follow-up with cardiology 3. Follow-up with nephrology  Discharge Diagnoses:  Active Hospital Problems   Diagnosis Date Noted  . Atrial fibrillation with RVR ([place]) 09/17/2019  . Persistent atrial fibrillation ([place])   . Severe mitral insufficiency 09/22/2019  . Severe tricuspid regurgitation 09/22/2019  . Pacemaker   . Hypoglycemia due to insulin 09/17/2019  . AKI (acute kidney injury) ([place]) 08/28/2013  . Hyponatremia 08/28/2013  . Acute on chronic systolic congestive heart failure ([place]) 08/20/2013  . Acute on chronic combined systolic and diastolic CHF (congestive heart failure) [facility])     Resolved Hospital Problems  No resolved problems to display.    Discharge Condition: stable  Diet recommendation: heart healthy/carb modified  Filed Weights   09/24/19 0702 09/25/19 0700 09/26/19 0427  Weight: 117.9 kg 114.6 kg 113.6 kg    History of present illness: (Per admitting MD Dr. Myna Hidalgo) PCP: Deland Pretty, MD   Patient coming from: Home   Chief Complaint: SOB, chest discomfort  HPI: Sara Walker is a 69 y.o. female with medical history significant for remote breast cancer treated with Adriamycin and radiation, chronic combined CHF with EF 20 to 25%, insulin-dependent diabetes mellitus, and history of polymorphic VT status post ICD, now presenting to the emergency department with shortness of breath, chest discomfort, and atrial fibrillation with rapid rate.  Patient reports that she received the first dose of her Covid vaccination on 09/09/2019 and shortly after  developed nausea, general malaise, shortness of breath, orthopnea, palpitations, and chest discomfort.  She received a call from her cardiology clinic reporting that she had prolonged episodes of rapid A. fib detected.  She was encouraged to go to the nearest ED.  She has not noticed much leg swelling but has been sleeping in a recliner the last few days.  She denies any fevers or chills.  Dyspnea is worse with exertion.  No calf tenderness or hemoptysis.    ED Course: Upon arrival to the ED, patient is found to be afebrile, saturating mid 90s on room air, tachypneic in the upper 20s, tachycardic in the 209O, and with systolic blood pressure 709.  EKG features atrial fibrillation with rate 125, and nonspecific IVCD with LAD.  Chest x-ray is concerning for CHF.  Chemistry panel notable for sodium of 128 and creatinine 1.82, up from 0.9 in September 2020.  Serum glucose was 69.  CBC with mild leukocytosis.  High-sensitivity troponin was 39, and then 38 2 hours later.  BNP elevated to 1100.  Patient was started on diltiazem infusion, given a dose of IV Lasix, and screened for COVID-19 in the ED.  ED physician reviewed the case and EKG with cardiology who agreed with medical admission for ongoing rate control and diuresis.   Hospital Course:  Principal Problem:   Atrial fibrillation with RVR (HCC) Active Problems:   Acute on chronic combined systolic and diastolic CHF (congestive heart failure) (HCC)   Acute on chronic systolic congestive heart failure (HCC)   AKI (acute kidney injury) (HCC)   Hyponatremia   Hypoglycemia due to insulin   Pacemaker   Severe  mitral insufficiency   Severe tricuspid regurgitation   Persistent atrial fibrillation (HCC)   New diagnosis of A. fib RVR, history of BiV ICD Patient has history of digoxin toxicity BivICD setting adjusted on 3/16,  Plan per cardiology: -Apixaban 5 mg twice daily - Torsemide 50 mg ONCE daily. Take TWICE daily if weight over 255 lb. - KCl  40 mEq twice daily - Amiodarone 400 mg daily until April 01, then 200 mg daily - Carvedilol 25 mg twice daily (unchanged) Will hold off spironolactone due to low BP, plan to restart at appt next week.   Repeat BMET at follow up next week.  Daily weights and bring weight log to appt.   Acute on chronic systolic heart failure History of nonischemic cardiomyopathy secondary to Adriamycin Last left ventricular EF 25 to 30% Status post BiV ICD Received torsemide 50 mg twice a day in the hospital,  Discharged on torsemide 50 mg ONCE daily. Take TWICE daily if weight over 255 lb. per cardiology recommendation  Hypervolemic hyponatremia Improved, sodium of 123 on presentation, sodium 131 on discharge  AKI on CKD 3B Likely cardiorenal syndrome Creatinine improving -Nephrology input appreciated  Hypokalemia, replace K  Insulin-dependent type 2 diabetes, uncontrolled A1c 7.2 With hyperglycemia and hypoglycemia She is to follow-up with PCP for insulin pump management  Body mass index is 38.09 kg/m.   Procedures:  none  Consultations:  Cardiology  Nephrology  Discharge Exam: BP 96/62 (BP Location: Left Arm)   Pulse 81   Temp (!) 97.5 F (36.4 C) (Oral)   Resp 15   Ht 5\' 8"  (1.727 m)   Wt 113.6 kg   SpO2 95%   BMI 38.09 kg/m   General: NAD, pleasant Cardiovascular: Paced rhythm Respiratory: Improved aeration, no rales, no wheezing, no rhonchi Extremity: Bilateral lower extremity pitting edema improved  Discharge Instructions You were cared for by a hospitalist during your hospital stay. If you have any questions about your discharge medications or the care you received while you were in the hospital after you are discharged, you can call the unit and asked to speak with the hospitalist on call if the hospitalist that took care of you is not available. Once you are discharged, your primary care physician will handle any further medical issues. Please note that NO  REFILLS for any discharge medications will be authorized once you are discharged, as it is imperative that you return to your primary care physician (or establish a relationship with a primary care physician if you do not have one) for your aftercare needs so that they can reassess your need for medications and monitor your lab values.  Discharge Instructions    Diet - low sodium heart healthy   Complete by: As directed    Carb modified diet   Discharge instructions   Complete by: As directed    Daily weights and bring weight log to appt.   Increase activity slowly   Complete by: As directed      Allergies as of 09/26/2019      Reactions   Lasix [furosemide] Other (See Comments)   Causes gout   Ace Inhibitors Cough   Codeine Other (See Comments)   Crazy   Digitalis Other (See Comments)   "saw yellow circles"   Levaquin [levofloxacin In D5w] Other (See Comments)   Gout   Other Other (See Comments)   Nitro spray: reaction unknown   Statins Other (See Comments)   Reaction unknown   Xarelto [rivaroxaban]  Zyrtec [cetirizine] Other (See Comments)   Slept for a day then was up for 4 days afterwards      Medication List    STOP taking these medications   magnesium oxide 400 (241.3 Mg) MG tablet Commonly known as: MAG-OX   metolazone 5 MG tablet Commonly known as: ZAROXOLYN   spironolactone 50 MG tablet Commonly known as: ALDACTONE     TAKE these medications   allopurinol 300 MG tablet Commonly known as: ZYLOPRIM Take 450 mg by mouth daily.   amiodarone 400 MG tablet Commonly known as: PACERONE Take 1 tablet (400 mg total) by mouth daily for 14 days.   amiodarone 200 MG tablet Commonly known as: Pacerone Take 1 tablet (200 mg total) by mouth daily. Start taking on: October 10, 2019   apixaban 5 MG Tabs tablet Commonly known as: ELIQUIS Take 1 tablet (5 mg total) by mouth 2 (two) times daily.   carvedilol 25 MG tablet Commonly known as: COREG Take 1 tablet (25  mg total) by mouth 2 (two) times daily.   colchicine 0.6 MG tablet Take 0.6 mg by mouth daily as needed (for gout).   insulin pump Soln Inject into the skin continuous. Novolin R   Lutein 20 MG Caps Take 20 mg by mouth daily.   potassium chloride SA 20 MEQ tablet Commonly known as: KLOR-CON Take 2 tablets (40 mEq total) by mouth 2 (two) times daily. What changed: when to take this   torsemide 10 MG tablet Commonly known as: DEMADEX Take 5 tablets (50 mg total) by mouth daily. Take additional 50mg  x1 if weight above 255 lb. Start taking on: September 27, 2019 What changed:   medication strength  how much to take  additional instructions   Vitamin D 50 MCG (2000 UT) Caps Take 4,000 Units by mouth daily.      Allergies  Allergen Reactions  . Lasix [Furosemide] Other (See Comments)    Causes gout  . Ace Inhibitors Cough  . Codeine Other (See Comments)    Crazy  . Digitalis Other (See Comments)    "saw yellow circles"  . Levaquin [Levofloxacin In D5w] Other (See Comments)    Gout   . Other Other (See Comments)    Nitro spray: reaction unknown  . Statins Other (See Comments)    Reaction unknown  . Xarelto [Rivaroxaban]   . Zyrtec [Cetirizine] Other (See Comments)    Slept for a day then was up for 4 days afterwards   Follow-up Information    [facility] Follow up.   Why: Home Health Physical Therapy-office to call the patient with visit times.  Contact information: [place], [place] [address] [phone]       Deland Pretty, MD Follow up in 1 week(s).   Specialty: Internal Medicine Why: hospital discharge follow up, repeat cbc/bmp at follow up. Contact information: [address] [place] [address] [phone]        Sanda Klein, MD .   Specialty: Cardiology Contact information: [address] [place] [address] [phone]        Ledora Bottcher, [place] Follow up on 10/22/2019.     Specialties: Physician Assistant, Cardiology, Radiology Why: 1:45 for hospital follow up and to get labs form [facility] on 4/21 Contact information: [address] [place] [address] [phone]        Darreld Mclean, PA-C Follow up on 10/07/2019.   Specialties: Physician Assistant, Cardiology Why:  10:30 am for hospital follow up Contact information: [address] [place] [address] [phone]            The results of significant diagnostics from this hospitalization (including imaging, microbiology, ancillary and laboratory) are listed below for reference.    Significant Diagnostic Studies: DG Chest Portable 1 View  Result Date: 09/17/2019 CLINICAL DATA:  Shortness of breath EXAM: PORTABLE CHEST 1 VIEW COMPARISON:  08/28/2013 FINDINGS: There is a multi lead left-sided pacemaker in place. The heart size is enlarged. There is developing pulmonary edema with small bilateral pleural effusions. There is no pneumothorax. There is significant shift of the trachea to the patient's left which is likely secondary to the patient's known enlarged thyroid gland. IMPRESSION: Findings consistent with congestive heart failure. Electronically Signed   By: Constance Holster M.D.   On: 09/17/2019 00:13   ECHOCARDIOGRAM COMPLETE  Result Date: 09/17/2019    ECHOCARDIOGRAM REPORT   Patient Name:   STASSI FADELY Date of Exam: 09/17/2019 Medical Rec #:  [id number]      Height:       67.0 in Accession #:    [id number]     Weight:       250.6 lb Date of Birth:  05/16/1951      BSA:          2.226 m Patient Age:    53 years       BP:           116/82 mmHg Patient Gender: F              HR:           76 bpm. Exam Location:  Inpatient Procedure: 2D Echo, Cardiac Doppler, Color Doppler and Intracardiac            Opacification Agent Indications:    I50.40* Unspecified combined systolic (congestive) and diastolic                 (congestive) heart failure  History:        Patient  has prior history of Echocardiogram examinations, most                 recent 06/10/2014. Cardiomyopathy and CHF, Abnormal ECG and                 Defibrillator, Arrythmias:LBBB, Signs/Symptoms:Syncope; Risk                 Factors:Hypertension, Dyslipidemia and Diabetes. [place].                 Chemotherapy induced cardiomyopathy.  Sonographer:    Roseanna Rainbow RDCS Referring Phys: [id number] TIMOTHY S OPYD  Sonographer Comments: Technically difficult study due to poor echo windows and patient is morbidly obese. Image acquisition challenging due to patient body habitus. IMPRESSIONS  1. There is profound septal-lateral dyssynchrony, consistent with loss of resynchronization pacing. Left ventricular ejection fraction, by estimation, is 25 to 30%. The left ventricle has severely decreased function. The left ventricle demonstrates global hypokinesis. The left ventricular internal cavity size was severely dilated. Left ventricular diastolic function could not be evaluated. Left ventricular diastolic function could not be evaluated.  2. Right ventricular systolic function is mildly reduced. The right ventricular size is mildly enlarged. There is moderately elevated pulmonary artery systolic pressure.  3. Left atrial size was severely dilated.  4. The mitral valve is grossly normal. Moderate to severe mitral valve regurgitation.  5. Tricuspid valve regurgitation is mild  to moderate.  6. The aortic valve is normal in structure. Aortic valve regurgitation is not visualized.  7. The inferior vena cava is dilated in size with <50% respiratory variability, suggesting right atrial pressure of 15 mmHg. FINDINGS  Left Ventricle: There is profound septal-lateral dyssynchrony, consistent with loss of resynchronization pacing. Left ventricular ejection fraction, by estimation, is 25 to 30%. The left ventricle has severely decreased function. The left ventricle demonstrates global hypokinesis. Definity contrast agent was given IV to  delineate the left ventricular endocardial borders. The left ventricular internal cavity size was severely dilated. There is no left ventricular hypertrophy. Abnormal (paradoxical) septal motion, consistent with RV pacemaker. Left ventricular diastolic function could not be evaluated due to atrial fibrillation. Left ventricular diastolic function could not be evaluated. Right Ventricle: The right ventricular size is mildly enlarged. No increase in right ventricular wall thickness. Right ventricular systolic function is mildly reduced. There is moderately elevated pulmonary artery systolic pressure. The tricuspid regurgitant velocity is 3.34 m/s, and with an assumed right atrial pressure of 15 mmHg, the estimated right ventricular systolic pressure is 36.6 mmHg. Left Atrium: Left atrial size was severely dilated. Right Atrium: Right atrial size was normal in size. Pericardium: There is no evidence of pericardial effusion. Mitral Valve: The mitral valve is grossly normal. Moderate to severe mitral valve regurgitation, with centrally-directed jet. Tricuspid Valve: The tricuspid valve is normal in structure. Tricuspid valve regurgitation is mild to moderate. Aortic Valve: The aortic valve is normal in structure. Aortic valve regurgitation is not visualized. Pulmonic Valve: The pulmonic valve was normal in structure. Pulmonic valve regurgitation is not visualized. Aorta: The aortic root and ascending aorta are structurally normal, with no evidence of dilitation. Venous: The inferior vena cava is dilated in size with less than 50% respiratory variability, suggesting right atrial pressure of 15 mmHg. IAS/Shunts: No atrial level shunt detected by color flow Doppler. Additional Comments: A pacer wire is visualized.  LEFT VENTRICLE PLAX 2D LVIDd:         7.32 cm LVIDs:         6.49 cm LV PW:         1.31 cm LV IVS:        0.73 cm LVOT diam:     1.80 cm LV SV:         55 LV SV Index:   25 LVOT Area:     2.54 cm  RIGHT  VENTRICLE         IVC TAPSE (M-mode): 2.2 cm  IVC diam: 3.62 cm LEFT ATRIUM             Index       RIGHT ATRIUM           Index LA diam:        5.60 cm 2.52 cm/m  RA Area:     20.20 cm LA Vol (A2C):   70.0 ml 31.45 ml/m RA Volume:   53.50 ml  24.04 ml/m LA Vol (A4C):   82.4 ml 37.02 ml/m LA Biplane Vol: 78.6 ml 35.32 ml/m  AORTIC VALVE LVOT Vmax:   110.00 cm/s LVOT Vmean:  82.300 cm/s LVOT VTI:    0.216 m  AORTA Ao Root diam: 3.10 cm Ao Asc diam:  3.40 cm MITRAL VALVE                 TRICUSPID VALVE MV Area (PHT): 3.65 cm      TV Peak grad:   27.0 mmHg MV Decel  Time: 208 msec      TV Vmax:        2.60 m/s MR Peak grad:    64.3 mmHg   TR Peak grad:   44.6 mmHg MR Mean grad:    36.0 mmHg   TR Vmax:        334.00 cm/s MR Vmax:         401.00 cm/s MR Vmean:        274.0 cm/s  SHUNTS MR PISA:         4.02 cm    Systemic VTI:  0.22 m MR PISA Eff ROA: 25 mm      Systemic Diam: 1.80 cm MR PISA Radius:  0.80 cm MV E velocity: 118.00 cm/s Sanda Klein MD Electronically signed by Sanda Klein MD Signature Date/Time: 09/17/2019/11:09:52 AM    Final    ECHO TEE  Result Date: 09/20/2019    TRANSESOPHOGEAL ECHO REPORT   Patient Name:   Sara Walker Date of Exam: 09/20/2019 Medical Rec #:  [id number]      Height:       68.0 in Accession #:    [id number]     Weight:       263.4 lb Date of Birth:  1951/05/13      BSA:          2.297 m Patient Age:    58 years       BP:           110/67 mmHg Patient Gender: F              HR:           68 bpm. Exam Location:  Inpatient Procedure: Transesophageal Echo, Cardiac Doppler, Color Doppler and Saline            Contrast Bubble Study Indications:     Atrial Fibrillation  History:         Patient has prior history of Echocardiogram examinations, most                  recent 09/19/2019. Cardiomyopathy and CHF, Arrythmias:Atrial                  Fibrillation and LBBB, Signs/Symptoms:Syncope; Risk                  Factors:Hypertension, Dyslipidemia and Former Smoker. VT.   Sonographer:     Vickie Epley RDCS Referring Phys:  [id number] Jamse [place] NELSON Diagnosing Phys: Fransico Him MD PROCEDURE: The transesophogeal probe was passed without difficulty through the esophogus of the patient. Sedation performed by different physician. The patient was monitored while under deep sedation. Anesthestetic sedation was provided intravenously by Anesthesiology: 285.89mg  of Propofol. The patient developed no complications during the procedure. IMPRESSIONS  1. Left ventricular ejection fraction, by estimation, is 25 to 30%. The left ventricle has severely decreased function. The left ventricle demonstrates global hypokinesis. The left ventricular internal cavity size was severely dilated.  2. Right ventricular systolic function is mildly reduced. The right ventricular size is mildly enlarged.  3. There was a loosely formed thrombus in the body of the LA appendage. Left atrial size was severely dilated.  4. Right atrial size was mildly dilated.  5. Tricuspid valve regurgitation is moderate to severe.  6. The mitral valve is normal in structure. Moderate to severe mitral valve regurgitation likley related to MV leaflet teathering and annular dilatation. THe MR wraps around the LA but does not appear to enter  the pulmonary vein by colorflow doppler but  there is intermittent pulmonary vein flow reversal with PW doppler.  7. The aortic valve is tricuspid. Aortic valve regurgitation is not visualized. Mild aortic valve sclerosis is present, with no evidence of aortic valve stenosis.  8. Evidence of atrial level shunting detected by color flow Doppler. Agitated saline contrast bubble study was positive with shunting observed within 3-6 cardiac cycles suggestive of interatrial shunt. There is a small patent foramen ovale with bidirectional shunting across atrial septum.  9. Mild to moderate aortic atherosclerosis is present in the ascending and descending aorta. aorta. FINDINGS  Left Ventricle: Left  ventricular ejection fraction, by estimation, is 25 to 30%. The left ventricle has severely decreased function. The left ventricle demonstrates global hypokinesis. The left ventricular internal cavity size was severely dilated. There is no left ventricular hypertrophy. Right Ventricle: The right ventricular size is mildly enlarged. No increase in right ventricular wall thickness. Right ventricular systolic function is mildly reduced. Left Atrium: There was a loosely formed thrombus in the body of the LA appendage. Left atrial size was severely dilated. Spontaneous echo contrast was present in the left atrial appendage and left atrium. A left atrial/left atrial appendage thrombus was detected. Right Atrium: Right atrial size was mildly dilated. Pericardium: There is no evidence of pericardial effusion. Mitral Valve: The mitral valve is normal in structure. Moderate to severe mitral valve regurgitation. Tricuspid Valve: The tricuspid valve is normal in structure. Tricuspid valve regurgitation is moderate to severe. Aortic Valve: The aortic valve is tricuspid. . There is mild thickening of the aortic valve. Aortic valve regurgitation is not visualized. Mild aortic valve sclerosis is present, with no evidence of aortic valve stenosis. There is mild thickening of the aortic valve. Pulmonic Valve: The pulmonic valve was not well visualized. Pulmonic valve regurgitation is not visualized. Aorta: Mild to moderate aortic atherosclerosis is present in the ascending and descending aorta. Mild to moderate aortic atherosclerosis is present in the ascending and descending aorta. IAS/Shunts: There is redundancy of the interatrial septum. Evidence of atrial level shunting detected by color flow Doppler. Agitated saline contrast was given intravenously to evaluate for intracardiac shunting. Agitated saline contrast bubble study was  positive with shunting observed within 3-6 cardiac cycles suggestive of interatrial shunt. A small  patent foramen ovale is detected with bidirectional shunting across atrial septum. Additional Comments: A pacer wire is visualized.  MR Peak grad:    84.3 mmHg MR Mean grad:    48.0 mmHg MR Vmax:         459.00 cm/s MR Vmean:        321.0 cm/s MR PISA:         1.57 cm MR PISA Eff ROA: 12 mm MR PISA Radius:  0.50 cm Fransico Him MD Electronically signed by Fransico Him MD Signature Date/Time: 09/20/2019/5:24:20 PM    Final    ECHOCARDIOGRAM LIMITED  Result Date: 09/19/2019    ECHOCARDIOGRAM LIMITED REPORT   Patient Name:   Sara Walker Date of Exam: 09/19/2019 Medical Rec #:  [id number]      Height:       68.0 in Accession #:    [id number]     Weight:       263.4 lb Date of Birth:  Sep 20, 1950      BSA:          2.297 m Patient Age:    44 years       BP:  108/65 mmHg Patient Gender: F              HR:           70 bpm. Exam Location:  Inpatient Procedure: Limited Echo and Intracardiac Opacification Agent Indications:    Reason for exam-Echo Cardiomyopathy-Unspecified 425.9 / I42.9  History:        Patient has prior history of Echocardiogram examinations, most                 recent 09/17/2019. CHF and Cardiomyopathy, Biventricular ICD                 (implantable cardioverter-defibrillator) in place,                 Arrythmias:Atrial Fibrillation and LBBB; Risk                 Factors:Hypertension, Dyslipidemia, Diabetes and Obesity.  Sonographer:    Vikki Ports Turrentine Referring Phys: [id number] [place]  Sonographer Comments: Image acquisition challenging due to patient body habitus. IMPRESSIONS  1. Left ventricular ejection fraction, by estimation, is 25 to 30%. The left ventricle has severely decreased function. The left ventricle demonstrates global hypokinesis. The left ventricular internal cavity size was severely dilated. There is mild concentric left ventricular hypertrophy. Left ventricular diastolic parameters are indeterminate.  2. Mild mitral valve regurgitation.  3. The aortic valve is  tricuspid.  4. There is moderately elevated pulmonary artery systolic pressure.  5. The inferior vena cava is dilated in size with <50% respiratory variability, suggesting right atrial pressure of 15 mmHg. FINDINGS  Left Ventricle: Global hypokinesis with akinesis of the inferoseptal and inferior myocardium. Left ventricular ejection fraction, by estimation, is 25 to 30%. The left ventricle has severely decreased function. The left ventricle demonstrates global hypokinesis. Definity contrast agent was given IV to delineate the left ventricular endocardial borders. The left ventricular internal cavity size was severely dilated. There is mild concentric left ventricular hypertrophy. Right Ventricle: There is moderately elevated pulmonary artery systolic pressure. The tricuspid regurgitant velocity is 2.75 m/s, and with an assumed right atrial pressure of 15 mmHg, the estimated right ventricular systolic pressure is 63.1 mmHg. Pericardium: Trivial pericardial effusion is present. Mitral Valve: Mild mitral valve regurgitation. Tricuspid Valve: Tricuspid valve regurgitation is mild. Aortic Valve: The aortic valve is tricuspid. Pulmonic Valve: Pulmonic valve regurgitation is trivial. Venous: The inferior vena cava is dilated in size with less than 50% respiratory variability, suggesting right atrial pressure of 15 mmHg. Additional Comments: A pacer wire is visualized.  LEFT VENTRICLE PLAX 2D LVIDd:         7.10 cm LVIDs:         6.22 cm LV PW:         1.04 cm LV IVS:        1.10 cm  MITRAL VALVE                TRICUSPID VALVE MV Area (PHT): 4.60 cm     TR Peak grad:   30.2 mmHg MV Decel Time: 165 msec     TR Vmax:        275.00 cm/s MV E velocity: 117.00 cm/s MV A velocity: 42.20 cm/s MV E/A ratio:  2.77 Skeet Latch MD Electronically signed by Skeet Latch MD Signature Date/Time: 09/19/2019/3:36:30 PM    Final    [place] EKG SITE RITE  Result Date: 09/21/2019 If Site Rite image not attached, placement could not be  confirmed due to current  cardiac rhythm.   Microbiology: Recent Results (from the past 240 hour(s))  SARS CORONAVIRUS 2 (TAT 6-24 HRS) Nasopharyngeal Nasopharyngeal Swab     Status: None   Collection Time: 09/17/19 12:04 AM   Specimen: Nasopharyngeal Swab  Result Value Ref Range Status   SARS Coronavirus 2 NEGATIVE NEGATIVE Final    Comment: (NOTE) SARS-CoV-2 target nucleic acids are NOT DETECTED. The SARS-CoV-2 RNA is generally detectable in upper and lower respiratory specimens during the acute phase of infection. Negative results do not preclude SARS-CoV-2 infection, do not rule out co-infections with other pathogens, and should not be used as the sole basis for treatment or other patient management decisions. Negative results must be combined with clinical observations, patient history, and epidemiological information. The expected result is Negative. Fact Sheet for Patients: SugarRoll.be Fact Sheet for Healthcare Providers: https://www.woods-mathews.com/ This test is not yet approved or cleared by the [place] FDA and  has been authorized for detection and/or diagnosis of SARS-CoV-2 by FDA under an Emergency Use Authorization (EUA). This EUA will remain  in effect (meaning this test can be used) for the duration of the COVID-19 declaration under Section 56 4(b)(1) of the Act, 21 U.S.C. section 360bbb-3(b)(1), unless the authorization is terminated or revoked sooner. Performed at [facility], [place] [address] [place], [place] [address]      Labs: Basic Metabolic Panel: Recent Labs  Lab 09/21/19 0259 09/22/19 0434 09/23/19 0329 09/24/19 0242 09/25/19 0445  NA 125* 125* 128* 127* 131*  K 4.5 3.7 3.6 4.9 3.2*  CL 85* 84* 84* 88* 84*  CO2 26 27 28 26 28   GLUCOSE 130* 144* 82 89 60*  BUN 79* 76* 74* 78* 73*  CREATININE 2.40* 2.01* 1.88* 2.12* 1.81*  CALCIUM 9.0 9.0 9.6 9.6 9.9  MG 3.0* 2.5* 2.7* 2.7* 2.3  PHOS  --   3.9 4.1 4.3 4.4   Liver Function Tests: Recent Labs  Lab 09/20/19 0004 09/22/19 0434 09/23/19 0329 09/24/19 0242 09/25/19 0445  AST 70*  --   --   --   --   ALT 157*  --   --   --   --   ALKPHOS 64  --   --   --   --   BILITOT 1.1  --   --   --   --   PROT 5.8*  --   --   --   --   ALBUMIN 3.4* 3.3* 3.5 3.5 3.5   No results for input(s): LIPASE, AMYLASE in the last 168 hours. No results for input(s): AMMONIA in the last 168 hours. CBC: No results for input(s): WBC, NEUTROABS, HGB, HCT, MCV, PLT in the last 168 hours. Cardiac Enzymes: No results for input(s): CKTOTAL, CKMB, CKMBINDEX, TROPONINI in the last 168 hours. BNP: BNP (last 3 results) Recent Labs    09/16/19 2349  BNP 1,100.9*    ProBNP (last 3 results) No results for input(s): PROBNP in the last 8760 hours.  CBG: Recent Labs  Lab 09/25/19 2139 09/25/19 2236 09/26/19 0415 09/26/19 0456 09/26/19 0808  GLUCAP 67* 132* 64* 111* 112*       Signed:  Florencia Reasons MD, PhD, FACP  [facility] 09/26/2019, 10:27 AM

## 2019-09-27 ENCOUNTER — Encounter: Payer: Self-pay | Admitting: *Deleted

## 2019-09-27 ENCOUNTER — Other Ambulatory Visit: Payer: Self-pay | Admitting: *Deleted

## 2019-09-27 NOTE — Patient Outreach (Signed)
Initial telephone outreach for [facility] follow up.  [name] gave permission to participate with [facility] NP for care management. She gave a thorough review of her complicated hx which includes: DM, HF, Hx Breast Ca, AFIB, HTN, HLD, CKD III, Gout, Obesity.  Our focus will be on HF although her diabetes and all other co-morbidities need to be considered also.  She advise she had a severe allergic reaction to her first [place] vaccine including angioedema.  She lives alone, divorced, has very caring daughter and son, friends and neighbors which are very supportive.  She reports a visual disturbance which started during her hospitalization. This could be a side effect of the amiodarone and or diabetic eye disease.  She will see a provider at her primary care office on 3/23 next week and cardiology on 3/29.  She knows to weigh daily, record the weight, may take extra torsemide if wt >255 and call Dr. Tommye Standard. Limit fluids to 1500 cc, take meds, try to walk in her home as much as possible using her walker.  She is complaining of constipation, no BM in 3 days.  Patient was recently discharged from hospital and all medications have been reviewed. Outpatient Encounter Medications as of 09/27/2019  Medication Sig  . allopurinol (ZYLOPRIM) 300 MG tablet Take 450 mg by mouth daily.   Derrill Memo ON 10/10/2019] amiodarone (PACERONE) 200 MG tablet Take 1 tablet (200 mg total) by mouth daily.  Marland Kitchen amiodarone (PACERONE) 400 MG tablet Take 1 tablet (400 mg total) by mouth daily for 14 days.  Marland Kitchen apixaban (ELIQUIS) 5 MG TABS tablet Take 1 tablet (5 mg total) by mouth 2 (two) times daily.  . carvedilol (COREG) 25 MG tablet Take 1 tablet (25 mg total) by mouth 2 (two) times daily.  . Cholecalciferol (VITAMIN D) 2000 units CAPS Take 4,000 Units by mouth daily.   . colchicine 0.6 MG tablet Take 0.6 mg by mouth daily as needed (for gout).   . Insulin Human (INSULIN PUMP) SOLN Inject into the skin continuous. Novolin R   . Lutein 20 MG CAPS Take 20 mg by mouth daily.   . potassium chloride SA (KLOR-CON) 20 MEQ tablet Take 2 tablets (40 mEq total) by mouth 2 (two) times daily.  Marland Kitchen torsemide (DEMADEX) 100 MG tablet Take 0.5 tablets (50 mg total) by mouth daily. Take additional 50mg  x1 if weight above 255 lb.   No facility-administered encounter medications on file as of 09/27/2019.   Fall Risk  09/27/2019  Falls in the past year? 0  Number falls in past yr: 0  Injury with Fall? 0  Risk for fall due to : Impaired vision  Follow up Falls evaluation completed   Depression screen [facility] 2/9 09/27/2019  Decreased Interest 1  Down, Depressed, Hopeless 1  PHQ - 2 Score 2  Altered sleeping 2  Tired, decreased energy 3  Change in appetite 2  Feeling bad or failure about yourself  0  Trouble concentrating 0  Moving slowly or fidgety/restless 0  Suicidal thoughts 1  PHQ-9 Score 10  Difficult doing work/chores Somewhat difficult   [facility] CM Care Plan Problem One     Most Recent Value  Care Plan Problem One  CHF  Role Documenting the Problem One  Care Management Coordinator  Care Plan for Problem One  Active  THN Long Term Goal   Pt will not be hospitalized for HF exacerbation over the next 90 days.  THN Long Term Goal Start Date  09/27/19  Interventions for Problem One Long Term Goal  Reviewed pt knowledge of HF Action Plan, reinforced daily wts, low na diet, taking meds as ordered, attending MD appts.  [facility] CM Short Term Goal #1   Pt will report daily wt routine and values >255 on each weekly phone call over the next 30 days.  [facility]N CM Short Term Goal #1 Start Date  09/27/19  Interventions for Short Term Goal #1  Pt and cardiologist have set protocol for increasing wt >255. Pt to follow instructions and report progress each week.  THN CM Short Term Goal #2   Pt will review HF Action plan zones with NP over the next month.  THN CM Short Term Goal #2 Start Date  09/27/19  Interventions for Short Term Goal #2  Assessed  pt knowledge of when to call the MD.    [facility] CM Care Plan Problem Two     Most Recent Value  Role Documenting the Problem Two  Care Management Coordinator  (Pended)   Care Plan for Problem Two  Active  (Pended)   THN CM Short Term Goal #1   Pt to call her opthalmologist within the next 2 weeks to report altered vision.  (Pended)   THN CM Short Term Goal #1 Start Date  09/27/19  (Pended)   Interventions for Short Term Goal #2   Alerted to need to report vision change to her specialist.  (Pended)     [facility]N CM Care Plan Problem Three     Most Recent Value  Care Plan Problem Three  Constipation  (Pended)   Role Documenting the Problem Three  Care Management Coordinator  (Pended)   Care Plan for Problem Three  Active  (Pended)      I will call her again in one week. I've encouraged her to call me with any problems throughout our engagement.  Eulah Pont. Myrtie Neither, MSN, GNP-BC Gerontological Nurse Practitioner [facility] Care Management [phone]  .

## 2019-09-27 NOTE — Progress Notes (Signed)
Thank you :)

## 2019-09-30 ENCOUNTER — Other Ambulatory Visit: Payer: Self-pay

## 2019-09-30 ENCOUNTER — Emergency Department (HOSPITAL_COMMUNITY)
Admission: EM | Admit: 2019-09-30 | Discharge: 2019-09-30 | Disposition: A | Discharge status: Home / Self Care | Payer: Medicare Other | Attending: Emergency Medicine | Admitting: Emergency Medicine

## 2019-09-30 ENCOUNTER — Other Ambulatory Visit: Payer: Self-pay | Admitting: *Deleted

## 2019-09-30 ENCOUNTER — Emergency Department (HOSPITAL_COMMUNITY): Payer: Medicare Other

## 2019-09-30 DIAGNOSIS — E1122 Type 2 diabetes mellitus with diabetic chronic kidney disease: Secondary | ICD-10-CM | POA: Diagnosis not present

## 2019-09-30 DIAGNOSIS — R079 Chest pain, unspecified: Secondary | ICD-10-CM | POA: Diagnosis not present

## 2019-09-30 DIAGNOSIS — Z95810 Presence of automatic (implantable) cardiac defibrillator: Secondary | ICD-10-CM | POA: Diagnosis not present

## 2019-09-30 DIAGNOSIS — I4891 Unspecified atrial fibrillation: Secondary | ICD-10-CM | POA: Diagnosis not present

## 2019-09-30 DIAGNOSIS — Z7901 Long term (current) use of anticoagulants: Secondary | ICD-10-CM | POA: Diagnosis not present

## 2019-09-30 DIAGNOSIS — I959 Hypotension, unspecified: Secondary | ICD-10-CM | POA: Diagnosis not present

## 2019-09-30 DIAGNOSIS — N183 Chronic kidney disease, stage 3 unspecified: Secondary | ICD-10-CM | POA: Insufficient documentation

## 2019-09-30 DIAGNOSIS — Z794 Long term (current) use of insulin: Secondary | ICD-10-CM | POA: Diagnosis not present

## 2019-09-30 DIAGNOSIS — I130 Hypertensive heart and chronic kidney disease with heart failure and stage 1 through stage 4 chronic kidney disease, or unspecified chronic kidney disease: Secondary | ICD-10-CM | POA: Diagnosis not present

## 2019-09-30 DIAGNOSIS — N950 Postmenopausal bleeding: Secondary | ICD-10-CM | POA: Diagnosis present

## 2019-09-30 DIAGNOSIS — Z79899 Other long term (current) drug therapy: Secondary | ICD-10-CM | POA: Diagnosis not present

## 2019-09-30 DIAGNOSIS — N939 Abnormal uterine and vaginal bleeding, unspecified: Secondary | ICD-10-CM

## 2019-09-30 DIAGNOSIS — I5041 Acute combined systolic (congestive) and diastolic (congestive) heart failure: Secondary | ICD-10-CM | POA: Insufficient documentation

## 2019-09-30 DIAGNOSIS — R410 Disorientation, unspecified: Secondary | ICD-10-CM | POA: Diagnosis not present

## 2019-09-30 DIAGNOSIS — R58 Hemorrhage, not elsewhere classified: Secondary | ICD-10-CM | POA: Diagnosis not present

## 2019-09-30 DIAGNOSIS — M6281 Muscle weakness (generalized): Secondary | ICD-10-CM | POA: Diagnosis not present

## 2019-09-30 DIAGNOSIS — R531 Weakness: Secondary | ICD-10-CM | POA: Insufficient documentation

## 2019-09-30 LAB — CBC WITH DIFFERENTIAL/PLATELET
Abs Immature Granulocytes: 0.06 10*3/uL (ref 0.00–0.07)
Basophils Absolute: 0.1 10*3/uL (ref 0.0–0.1)
Basophils Relative: 1 %
Eosinophils Absolute: 0.1 10*3/uL (ref 0.0–0.5)
Eosinophils Relative: 1 %
HCT: 45.3 % (ref 36.0–46.0)
Hemoglobin: 14 g/dL (ref 12.0–15.0)
Immature Granulocytes: 1 %
Lymphocytes Relative: 15 %
Lymphs Abs: 1.5 10*3/uL (ref 0.7–4.0)
MCH: 29.3 pg (ref 26.0–34.0)
MCHC: 30.9 g/dL (ref 30.0–36.0)
MCV: 94.8 fL (ref 80.0–100.0)
Monocytes Absolute: 0.9 10*3/uL (ref 0.1–1.0)
Monocytes Relative: 9 %
Neutro Abs: 7.6 10*3/uL (ref 1.7–7.7)
Neutrophils Relative %: 73 %
Platelets: 277 10*3/uL (ref 150–400)
RBC: 4.78 MIL/uL (ref 3.87–5.11)
RDW: 15.6 % — ABNORMAL HIGH (ref 11.5–15.5)
WBC: 10.2 10*3/uL (ref 4.0–10.5)
nRBC: 0 % (ref 0.0–0.2)

## 2019-09-30 LAB — COMPREHENSIVE METABOLIC PANEL
ALT: 55 U/L — ABNORMAL HIGH (ref 0–44)
AST: 33 U/L (ref 15–41)
Albumin: 4 g/dL (ref 3.5–5.0)
Alkaline Phosphatase: 71 U/L (ref 38–126)
Anion gap: 19 — ABNORMAL HIGH (ref 5–15)
BUN: 61 mg/dL — ABNORMAL HIGH (ref 8–23)
CO2: 27 mmol/L (ref 22–32)
Calcium: 10 mg/dL (ref 8.9–10.3)
Chloride: 83 mmol/L — ABNORMAL LOW (ref 98–111)
Creatinine, Ser: 2.21 mg/dL — ABNORMAL HIGH (ref 0.44–1.00)
GFR calc Af Amer: 26 mL/min — ABNORMAL LOW (ref >60)
GFR calc non Af Amer: 22 mL/min — ABNORMAL LOW (ref >60)
Glucose, Bld: 97 mg/dL (ref 70–99)
Potassium: 4.8 mmol/L (ref 3.5–5.1)
Sodium: 129 mmol/L — ABNORMAL LOW (ref 135–145)
Total Bilirubin: 2.7 mg/dL — ABNORMAL HIGH (ref 0.3–1.2)
Total Protein: 6.5 g/dL (ref 6.5–8.1)

## 2019-09-30 LAB — TROPONIN I (HIGH SENSITIVITY): Troponin I (High Sensitivity): 29 ng/L — ABNORMAL HIGH (ref <18)

## 2019-09-30 LAB — BRAIN NATRIURETIC PEPTIDE: B Natriuretic Peptide: 864.4 pg/mL — ABNORMAL HIGH (ref 0.0–100.0)

## 2019-09-30 LAB — CBG MONITORING, ED: Glucose-Capillary: 113 mg/dL — ABNORMAL HIGH (ref 70–99)

## 2019-09-30 LAB — MAGNESIUM: Magnesium: 2.2 mg/dL (ref 1.7–2.4)

## 2019-09-30 MED ORDER — APIXABAN 5 MG PO TABS
5.0000 mg | ORAL_TABLET | Freq: Two times a day (BID) | ORAL | Status: DC
  Administered 2019-09-30: 11:00:00 5 mg via ORAL
  Filled 2019-09-30 (×2): qty 1

## 2019-09-30 MED ORDER — VITAMIN D 25 MCG (1000 UNIT) PO TABS
4000.0000 [IU] | ORAL_TABLET | Freq: Every day | ORAL | Status: DC
  Administered 2019-09-30: 4000 [IU] via ORAL
  Filled 2019-09-30: qty 4

## 2019-09-30 MED ORDER — CARVEDILOL 12.5 MG PO TABS
25.0000 mg | ORAL_TABLET | Freq: Two times a day (BID) | ORAL | Status: DC
  Administered 2019-09-30: 11:00:00 25 mg via ORAL
  Filled 2019-09-30: qty 2

## 2019-09-30 MED ORDER — LORAZEPAM 2 MG/ML IJ SOLN
0.5000 mg | Freq: Once | INTRAMUSCULAR | Status: AC
  Administered 2019-09-30: 05:00:00 0.5 mg via INTRAVENOUS
  Filled 2019-09-30: qty 1

## 2019-09-30 MED ORDER — LORAZEPAM 2 MG/ML IJ SOLN: 1.0000 mg | Freq: Once | INTRAMUSCULAR | Status: DC

## 2019-09-30 NOTE — Discharge Instructions (Addendum)
Your work-up today was reassuring.  We have consulted social work to help you get home health, PT to help with continued management.  Return emergency department for any breathing, worsening weakness or any other worsening or concerning symptoms.

## 2019-09-30 NOTE — Progress Notes (Signed)
09/30/19 1316  PT Visit Information  Last PT Received On 09/30/19  Assistance Needed +1  PT/OT/SLP Co-Evaluation/Treatment Yes  Reason for Co-Treatment For patient/therapist safety  PT goals addressed during session Mobility/safety with mobility  History of Present Illness 69 yo female admitted with excessive vaginal bleeding and decreased glucose levels.  Pt with weight gain 5 pounds in addition. PMH Breast CA chronic CHF with EF 20-20% insulin dependent DM, ICD recent hospitalization 3/9-3/18 A fib with RVR HTN CAD, CKD, COPD,   Precautions  Precautions Fall  Restrictions  Weight Bearing Restrictions No  Home Living  Family/patient expects to be discharged to: Private residence  Living Arrangements Alone  Available Help at Discharge Family;Available PRN/intermittently  Type of [place] entrance  Entrance Stairs-Number of Steps ramp on back  Home Layout One level  Bathroom Shower/Tub Walk-in shower  Bathroom Toilet Handicapped height  Home Equipment Wheelchair - manual;Cane - single point;Shower seat - built in;Walker - 4 wheels  Additional Comments reports daughter is several miles away, son is 1 mile away and retired Therapist, sports cousin lives next door. Pt has 11 yo friend that will drive her to appointments.   Prior Function  Level of Independence Independent with assistive device(s)  Comments reports not being able to cook or complete ADLS since admission due to fatigue  Communication  Communication No difficulties  Pain Assessment  Pain Assessment No/denies pain  Cognition  Arousal/Alertness Awake/alert  Behavior During Therapy [facility] for tasks assessed/performed  Overall Cognitive Status Within Functional Limits for tasks assessed  General Comments pleasantly irritable as the course of admission did not work smoothly with ambulance taking her to [facility] first before arrival to [facility]. pt understandable in her need to ask additional questions. Pt refused ativan from RN  staff sighting that she has no visitors allowed and she would not be able to make informed decision on medication showing higher executive functional reasoning  Upper Extremity Assessment  Upper Extremity Assessment Defer to OT evaluation  Lower Extremity Assessment  Lower Extremity Assessment Generalized weakness  Cervical / Trunk Assessment  Cervical / Trunk Assessment Normal  Bed Mobility  Overal bed mobility Modified Independent  General bed mobility comments HOB elevated and requires bed rail  Transfers  Overall transfer level Modified independent  Ambulation/Gait  Ambulation/Gait assistance Supervision;Min guard  Gait Distance (Feet) 150 Feet  Assistive device None  Gait Pattern/deviations Step-through pattern;Decreased stride length  General Gait Details Pt requiring supervision to min guard for safety. One instance of dizziness and required seated rest; resolved upon sitting. Pt reporting increased fatigue and weakness.   Gait velocity decreased  Balance  Overall balance assessment Needs assistance  Sitting-balance support No upper extremity supported;Feet supported  Sitting balance-Leahy Scale Good  Standing balance support No upper extremity supported  Standing balance-Leahy Scale Fair  General Comments  General comments (skin integrity, edema, etc.) pt with DOE 3 out 4 and requires rest break. Pt needs > 3 minutes to rebound. Educated about using rollator for increased safety. Reports she can likely go stay with a friend.   PT - End of Session  Equipment Utilized During Treatment Gait belt  Activity Tolerance Patient limited by fatigue  Patient left in bed;with call bell/phone within reach  Nurse Communication Mobility status  PT Assessment  PT Recommendation/Assessment Patient needs continued PT services  PT Visit Diagnosis Muscle weakness (generalized) (M62.81)  PT Problem List Decreased strength;Decreased mobility;Obesity;Decreased activity tolerance;Cardiopulmonary  status limiting activity;Decreased balance  Barriers to  Discharge Decreased caregiver support  PT Plan  PT Frequency (ACUTE ONLY) Min 3X/week  PT Treatment/Interventions (ACUTE ONLY) Therapeutic activities;Gait training;Therapeutic exercise;Patient/family education;Balance training;Functional mobility training  AM-PAC PT "6 Clicks" Mobility Outcome Measure (Version 2)  Help needed turning from your back to your side while in a flat bed without using bedrails? 4  Help needed moving from lying on your back to sitting on the side of a flat bed without using bedrails? 4  Help needed moving to and from a bed to a chair (including a wheelchair)? 4  Help needed standing up from a chair using your arms (e.g., wheelchair or bedside chair)? 4  Help needed to walk in hospital room? 3  Help needed climbing 3-5 steps with a railing?  3  6 Click Score 22  Consider Recommendation of Discharge To: Home with no services  PT Recommendation  Follow Up Recommendations Home health PT ([place] and [facility])  PT equipment 3in1 (PT) (bariatric 3 in 1)  Individuals Consulted  Consulted and Agree with Results and Recommendations Patient  Acute Rehab PT Goals  Patient Stated Goal to get some sleep and get to doctor about sugars tomorrow  PT Goal Formulation With patient  Time For Goal Achievement 10/14/19  Potential to Achieve Goals Good  PT Time Calculation  PT Start Time (ACUTE ONLY) 1113  PT Stop Time (ACUTE ONLY) 1158  PT Time Calculation (min) (ACUTE ONLY) 45 min  PT General Charges  $$ ACUTE PT VISIT 1 Visit  PT Evaluation  $PT Eval Moderate Complexity 1 Mod  Written Expression  Dominant Hand Right   Pt presenting with problem above and deficits below. Pt presenting with weakness and fatigue. Required seated rest X1 during gait secondary to fatigue and dizziness. Pt reports increased difficulty with performing ADL tasks at home. Feel she would benefit from [place], and [facility] at d/c to assist with ADL  and mobility tasks and help increase independence and safety. Reports she may be able to stay with her friend at d/c. Will continue to follow acutely to maximize functional mobility independence and safety.   Reuel Derby, PT, DPT  Acute Rehabilitation Services  Pager: [phone] Office: [phone]

## 2019-09-30 NOTE — ED Notes (Signed)
Pt transported to US

## 2019-09-30 NOTE — ED Triage Notes (Signed)
Pt arrives via EMS for excessive vaginal bleeding and blood sugar problems. Pt reports soaking 5 pads today. Hx of afib. AOx4, WPV94. Pt tearful at this time.    BP 118/83, HR 96, O2 98%.

## 2019-09-30 NOTE — ED Provider Notes (Signed)
Care assumed from Leanora Ivanoff, PA-C  at shift change with social work consult pending.  In brief, this patient is a 69 year old female with past medical history of CHF, V. tach who was recently hospitalized from 3/9-3/18 for A. fib with RVR.  She also had AKI, hypokalemia, hyponatremia.  She was started on Eliquis during her admission.  She is scheduled for an upcoming cardioversion on 4/21 with cardiology.  She comes in today for concerns of blood sugar, generalized weakness, continued vaginal bleeding.  She states that she went home and thought she would be able to take care of herself but since being at home, she has had generalized weakness and has had difficulty caring for herself.  She tells me that the vaginal bleeding has been an ongoing issue and is not new.  She states that she had an IUD placed by OB/GYN which helped her symptoms.  Please see note from previous provider for full history/physical exam.  Physical Exam  BP 106/62   Pulse 80   Temp 98.2 F (36.8 C) (Oral)   Resp 16   SpO2 97%   Physical Exam  ED Course/Procedures     Procedures  Results for orders placed or performed during the hospital encounter of 09/30/19 (from the past 24 hour(s))  Comprehensive metabolic panel     Status: Abnormal   Collection Time: 09/30/19  4:17 AM  Result Value Ref Range   Sodium 129 (L) 135 - 145 mmol/L   Potassium 4.8 3.5 - 5.1 mmol/L   Chloride 83 (L) 98 - 111 mmol/L   CO2 27 22 - 32 mmol/L   Glucose, Bld 97 70 - 99 mg/dL   BUN 61 (H) 8 - 23 mg/dL   Creatinine, Ser 2.21 (H) 0.44 - 1.00 mg/dL   Calcium 10.0 8.9 - 10.3 mg/dL   Total Protein 6.5 6.5 - 8.1 g/dL   Albumin 4.0 3.5 - 5.0 g/dL   AST 33 15 - 41 U/L   ALT 55 (H) 0 - 44 U/L   Alkaline Phosphatase 71 38 - 126 U/L   Total Bilirubin 2.7 (H) 0.3 - 1.2 mg/dL   GFR calc non Af Amer 22 (L) >60 mL/min   GFR calc Af Amer 26 (L) >60 mL/min   Anion gap 19 (H) 5 - 15  CBC with Differential     Status: Abnormal   Collection Time:  09/30/19  4:17 AM  Result Value Ref Range   WBC 10.2 4.0 - 10.5 K/uL   RBC 4.78 3.87 - 5.11 MIL/uL   Hemoglobin 14.0 12.0 - 15.0 g/dL   HCT 45.3 36.0 - 46.0 %   MCV 94.8 80.0 - 100.0 fL   MCH 29.3 26.0 - 34.0 pg   MCHC 30.9 30.0 - 36.0 g/dL   RDW 15.6 (H) 11.5 - 15.5 %   Platelets 277 150 - 400 K/uL   nRBC 0.0 0.0 - 0.2 %   Neutrophils Relative % 73 %   Neutro Abs 7.6 1.7 - 7.7 K/uL   Lymphocytes Relative 15 %   Lymphs Abs 1.5 0.7 - 4.0 K/uL   Monocytes Relative 9 %   Monocytes Absolute 0.9 0.1 - 1.0 K/uL   Eosinophils Relative 1 %   Eosinophils Absolute 0.1 0.0 - 0.5 K/uL   Basophils Relative 1 %   Basophils Absolute 0.1 0.0 - 0.1 K/uL   Immature Granulocytes 1 %   Abs Immature Granulocytes 0.06 0.00 - 0.07 K/uL  Troponin I (High Sensitivity)  Status: Abnormal   Collection Time: 09/30/19  4:17 AM  Result Value Ref Range   Troponin I (High Sensitivity) 29 (H) <18 ng/L  Magnesium     Status: None   Collection Time: 09/30/19  4:17 AM  Result Value Ref Range   Magnesium 2.2 1.7 - 2.4 mg/dL  Brain natriuretic peptide     Status: Abnormal   Collection Time: 09/30/19  4:17 AM  Result Value Ref Range   B Natriuretic Peptide 864.4 (H) 0.0 - 100.0 pg/mL  POC CBG, ED     Status: Abnormal   Collection Time: 09/30/19  7:13 AM  Result Value Ref Range   Glucose-Capillary 113 (H) 70 - 99 mg/dL     MDM   PLAN: Patient pending social work consult.  At this time, no indication for admission per previous team.   MDM: BNP is 864.4.  This is improving from her previous admission.  Magnesium is unremarkable.  Lance Bosch is 71.  CMP with BUN of 61, creatinine of 2.21.  This is consistent with her baseline.  CBC shows stable hemoglobin.  I discussed vaginal eating with patient.  She states this is not a new issue and has been an ongoing issue.  She did not want to have ultrasound for further evaluation of vaginal bleeding.  Given this prolonged history, I feel this is reasonable.  I  discussed with social work who has arranged for patient to have a bedside commode and will get PT OT consult here in the ED.  They are recommending doing a face-to-face that patient can get home health.  Patient's daughter is here to pick up patient.  Patient feels comfortable with plan. At this time, patient exhibits no emergent life-threatening condition that require further evaluation in ED or admission. Patient had ample opportunity for questions and discussion. All patient's questions were answered with full understanding. Strict return precautions discussed. Patient expresses understanding and agreement to plan.   1. Atrial fibrillation, unspecified type ([place])   2. Vaginal bleeding   3. Generalized weakness     Portions of this note were generated with Dragon dictation software. Dictation errors may occur despite best attempts at proofreading.      Volanda Napoleon, PA-C 09/30/19 1615    Noemi Chapel, MD 10/02/19 [phone]

## 2019-09-30 NOTE — ED Notes (Signed)
Daughter- Jason Coop- [phone]- would like a pt update

## 2019-09-30 NOTE — ED Notes (Signed)
Received call from [place] at [facility] r/t pacemaker interrogation report, reports unremarkable report except appears pt went into flutter 2 days ago, Aaron Edelman tells this nurse he will follow up regarding recommendations. This nurse to inform EDP.

## 2019-09-30 NOTE — ED Notes (Signed)
Pt discharged home per MD order. Pt daughter here for discharge ride home. Discharge summary reviewed with pt, pt verbalizes understanding. Bedside commode given to pt. Off unit via wheelchair.

## 2019-09-30 NOTE — ED Notes (Signed)
Informed pt, that daughter had called, pt tells this nurse, she she has her cell phone and will be in contact with her daughter,.

## 2019-09-30 NOTE — Patient Outreach (Signed)
Telephone call from pt requesting I call her in the ED earlier today. I was able to talk with her eventually and she advised that she had to come to the ED because of a low glucose level that she could not resolve, weakness and vomiting. She reports some problems that I have referred her to patient experience about. She was discharged after some hours.  She is home and she may go to stay with a friend that lives in [place], so that she is closer to [place] and her doctors for this week. She will see her diabetes PharmD tomorrow and a follow up with cardiology next week.  She is still having some vaginal bleeding that is brownish.  She did have resolution of her constipation she reported to me last week with an enema.   Have encouraged her to call me if any new problems occur otherwise I will check back in with her on Friday.  Eulah Pont. Myrtie Neither, MSN, [facility] Gerontological Nurse Practitioner [facility] Care Management [phone]

## 2019-09-30 NOTE — Progress Notes (Signed)
Occupational Therapy Evaluation Patient Details Name: Sara Walker MRN: [id number] DOB: 1951/07/09 Today's Date: 09/30/2019    History of Present Illness 69 yo female admitted with excessive vaginal bleeding and decreased glucose levels.  Pt with weight gain 5 pounds in addition. PMH Breast CA chronic CHF with EF 20-20% insulin dependent DM, ICD recent hospitalization 3/9-3/18 A fib with RVR HTN CAD, CKD, COPD,    Clinical Impression   PT admitted with vaginal bleeding and decreased glucose levels. Pt currently with functional limitiations due to the deficits listed below (see OT problem list). Pt could benefit from bariatric 3n1 due to body habitus and decrease fall risk at night when glucose per patient reports has dropped into 50s. Pt high risk for falls at night time. Pt reports decreased ability to complete basic ADLS due to fatigue and could benefit from [facility] to help maximize independence. An aide would help patient with hygiene and decrease risk for infection/ skin breakdown until therapy can help reach higher level of independence with self care.   Pt will benefit from skilled OT to increase their independence and safety with adls and balance to allow discharge [place]. Pt plans to stay with elderly friend that can drive until other services can be arranged and provided to help her return home.        Follow Up Recommendations  Home health OT    Equipment Recommendations  3 in 1 bedside commode(bariatric)    Recommendations for Other Services       Precautions / Restrictions Precautions Precautions: Fall      Mobility Bed Mobility Overal bed mobility: Modified Independent             General bed mobility comments: HOB elevated and requires bed rail  Transfers Overall transfer level: Modified independent                    Balance                                           ADL either performed or assessed with clinical judgement   ADL  Overall ADL's : Needs assistance/impaired Eating/Feeding: Modified independent   Grooming: Modified independent   Upper Body Bathing: Minimal assistance Upper Body Bathing Details (indicate cue type and reason): (A) to thread R UE into gown Lower Body Bathing: Minimal assistance   Upper Body Dressing : Minimal assistance   Lower Body Dressing: Minimal assistance Lower Body Dressing Details (indicate cue type and reason): pt leaves shoes not laced and no socks to allow independence with don doff shoes. pt is unable to reach feet with pt expressing body habitus limiting reach Toilet Transfer: Supervision/safety           Functional mobility during ADLs: Supervision/safety General ADL Comments: pt reports that she needs help at night due to blood sugar changes and inability to move arms and legs when this occurs.   Pt reports having a housekeeper for home management at this time due to inability to clean the home.    Vision Baseline Vision/History: Wears glasses Wears Glasses: At all times Additional Comments: changes in vision with medications last admission. pt states "my vision is messed up now forever i guess"     Perception     Praxis      Pertinent Vitals/Pain Pain Assessment: No/denies pain     Hand Dominance Right  Extremity/Trunk Assessment Upper Extremity Assessment Upper Extremity Assessment: Overall WFL for tasks assessed   Lower Extremity Assessment Lower Extremity Assessment: Overall WFL for tasks assessed   Cervical / Trunk Assessment Cervical / Trunk Assessment: Normal   Communication Communication Communication: No difficulties   Cognition Arousal/Alertness: Awake/alert Behavior During Therapy: WFL for tasks assessed/performed Overall Cognitive Status: Within Functional Limits for tasks assessed                                 General Comments: pleasantly irritable as the course of admission did not work smoothly with ambulance  taking her to [facility] first before arrival to [facility]. pt understandable in her need to ask additional questions. Pt refused ativan from RN staff sighting that she has no visitors allowed and she would not be able to make informed decision on medication showing higher executive functional reasoning   General Comments  pt with DOE 3 out 4 and requires rest break. Pt needs > 3 minutes to rebound     Exercises     Shoulder Instructions      Home Living Family/patient expects to be discharged to:: Private residence Living Arrangements: Alone Available Help at Discharge: Family;Available PRN/intermittently Type of Home: House Home Access: Ramped entrance Entrance Stairs-Number of Steps: ramp on back   Home Layout: One level     Bathroom Shower/Tub: Occupational psychologist: Handicapped height     Home Equipment: Wheelchair - manual;Cane - single point;Shower seat - built in;Bedside commode;Walker - 4 wheels   Additional Comments: reports daughter is several miles away, son is 1 mile away and retired Therapist, sports cousin lives next door. Pt has 68 yo friend that will drive her to appointments.       Prior Functioning/Environment Level of Independence: Independent with assistive device(s)        Comments: reports not being able to cook or complete ADLS since admission due to fatigue        OT Problem List: Decreased strength;Impaired balance (sitting and/or standing)      OT Treatment/Interventions: Self-care/ADL training;Therapeutic exercise;Energy conservation;DME and/or AE instruction;Therapeutic activities;Balance training    OT Goals(Current goals can be found in the care plan section) Acute Rehab OT Goals Patient Stated Goal: to get some sleep and get to doctor about sugars tomorrow OT Goal Formulation: With patient Time For Goal Achievement: 10/14/19 Potential to Achieve Goals: Good  OT Frequency: Min 2X/week   Barriers to D/C: Decreased caregiver support  lives alone and  cell phone coverage can make it hard to call out at times. pt reports having to call 3 numbers to get help with decreased glucose levels       Co-evaluation PT/OT/SLP Co-Evaluation/Treatment: Yes Reason for Co-Treatment: For patient/therapist safety          AM-PAC OT "6 Clicks" Daily Activity     Outcome Measure Help from another person eating meals?: None Help from another person taking care of personal grooming?: None Help from another person toileting, which includes using toliet, bedpan, or urinal?: A Little Help from another person bathing (including washing, rinsing, drying)?: A Little Help from another person to put on and taking off regular upper body clothing?: A Little Help from another person to put on and taking off regular lower body clothing?: A Little 6 Click Score: 20   End of Session Nurse Communication: Mobility status;Precautions  Activity Tolerance: Patient tolerated treatment well Patient left: in  bed;with call bell/phone within reach  OT Visit Diagnosis: Unsteadiness on feet (R26.81);Muscle weakness (generalized) (M62.81)                Time: 4446-1901 OT Time Calculation (min): 46 min Charges:  OT General Charges $OT Visit: 1 Visit OT Evaluation $OT Eval Moderate Complexity: 1 Mod OT Treatments $Self Care/Home Management : 8-22 mins   Brynn, OTR/L  Acute Rehabilitation Services Pager: [phone] Office: [phone] .   Jeri Modena 09/30/2019, 1:14 PM

## 2019-09-30 NOTE — ED Notes (Signed)
PT at bedside.

## 2019-09-30 NOTE — ED Provider Notes (Signed)
[facility] EMERGENCY DEPARTMENT Provider Note   CSN: [id number] Arrival date & time: 09/30/19  8453     History Chief Complaint  Patient presents with  . Blood Sugar Problem  . Vaginal Bleeding    Sara Walker is a 69 y.o. female with a history of remote breast cancer treated with Adriamycin and radiation, chronic combined CHF with EF of 20 to 25%, insulin-dependent diabetes mellitus, and history of polymorphic V. tach status post ICD who presents to the emergency department with multiple complaints.  Patient was recently hospitalized from 3/9-3/18 for A. fib RVR.  Her ICD setting was adjusted on 3/16.  She also presented with an AKI, hypokalemia, and hyponatremia.  Reports that she was also restarted on Eliquis during the admission.  She is scheduled for an upcoming cardioversion on 4/21 with Dr/ Croitoru.  Reports that she had an IUD placed by OB/GYN during a previous admission.  She also has concerns about low blood sugar at home.  Reports that she has been getting readings in the 50s to 65s.  She was recently started on an insulin pump, and tonight she became frustrated and pulled out the pump.   She reports that she has continued to have vaginal bleeding since her last discharge.  Reports that she soaked through 5 pads today.  However, she reports that bleeding has seemed to improved.      She also reports that she has gained 5 pounds since her most recent admission where she was diuresed 20 pounds throughout her hospitalization.  She does report that she has been having chest pain since her last hospitalization, but this is not changed in severity or character.  No shortness of breath.  No leg swelling.  No infectious symptoms, dysuria, abdominal pain, or back pain.  She reports that with all of her medical conditions that she lives alone and does not feel safe.  She reports that she feels as if she is unable to care for herself.  Reports that she has been so weak  since her discharge that she can barely do her dishes and has had no appetite.  The history is provided by the patient. No language interpreter was used.       Past Medical History:  Diagnosis Date  . Angina   . Anxiety   . Arthritis   . Atrial fibrillation ([place])   . Automatic implantable cardioverter-defibrillator in situ   . Breast cancer ([place])   . Cardiomyopathy secondary to chemotherapy [facility])    a.  doxurubicin;  b. s/p SJM Quadra Assura CRT-D, model [id number] Q, Ser# [id number]  . CHF (congestive heart failure) ([place])   . Chronic kidney disease    Stage 3  . Chronic systolic heart failure ([place])   . Coronary artery disease   . Depression   . DM2 (diabetes mellitus, type 2) ([place])   . Goiter   . Gout   . Hyperlipidemia   . Hypertension   . Invasive ductal carcinoma of breast ([place])   . LBBB (left bundle branch block)    evidnece of RBBB fall 2012  . 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 PAF (paroxysmal atrial fibrillation) ([place])   . PONV (postoperative nausea and vomiting)    nausea post ICD placement relieved with Zofran  . Shortness of breath   . Syncope     Patient Active Problem List   Diagnosis Date Noted  . Persistent atrial fibrillation ([place])   . Severe mitral  insufficiency 09/22/2019  . Severe tricuspid regurgitation 09/22/2019  . Pacemaker   . Atrial fibrillation with RVR ([place]) 09/17/2019  . Hypoglycemia due to insulin 09/17/2019  . Malignant tumor of breast ([place]) 02/14/2019  . Mixed hyperlipidemia 06/20/2018  . ICD (implantable cardioverter-defibrillator) battery depletion 02/23/2018  . 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 Torsades de pointes ([place]) 01/25/2018  . Acute bronchitis 07/28/2015  . Class 2 severe obesity with body mass index (BMI) of 35 to 39.9 with serious comorbidity ([place]) 12/19/2014  . VT (ventricular tachycardia) ([place]) 10/17/2014  . Paroxysmal atrial fibrillation ([place]) 06/10/2014  .  Recurrent vomiting 08/29/2013  . AKI (acute kidney injury) ([place]) 08/28/2013  . Nausea & vomiting 08/28/2013  . Dehydration 08/28/2013  . Hypokalemia 08/28/2013  . Hyponatremia 08/28/2013  . Leukocytosis 08/28/2013  . Acute on chronic systolic congestive heart failure ([place]) 08/20/2013  . Muscle herniation 06/19/2012  . Nonischemic cardiomyopathy ([place]) 12/13/2011  . Biventricular ICD (implantable cardioverter-defibrillator) in place 12/12/2011  . Cardiomyopathy secondary to chemotherapy ([place])   . Acute on chronic combined systolic and diastolic CHF (congestive heart failure) ([place])   . Syncope   . Doxorubicin adverse reaction 10/11/2011  . LBBB (left bundle branch block) 10/11/2011  . Goiter   . Diabetes mellitus type 2 in obese ([place])   . Invasive ductal carcinoma of breast ([place])   . Gout   . Hypercholesterolemia   . Essential hypertension     Past Surgical History:  Procedure Laterality Date  . BI-VENTRICULAR IMPLANTABLE CARDIOVERTER DEFIBRILLATOR N/A 12/12/2011   Procedure: BI-VENTRICULAR IMPLANTABLE CARDIOVERTER DEFIBRILLATOR  (CRT-D);  Surgeon: Deboraha Sprang, MD;  Location: [facility] CATH LAB;  Service: Cardiovascular;  Laterality: N/A;  . BIV ICD GENERATOR CHANGEOUT N/A 02/23/2018   Procedure: BIV ICD GENERATOR CHANGEOUT;  Surgeon: Sanda Klein, MD;  Location: [place] CV LAB;  Service: Cardiovascular;  Laterality: N/A;  . BREAST SURGERY     Left mastectomy  . BUBBLE STUDY  09/20/2019   Procedure: BUBBLE STUDY;  Surgeon: Sueanne Margarita, MD;  Location: [place];  Service: Cardiovascular;;  . CARDIAC DEFIBRILLATOR PLACEMENT     [place]  . [place]  . DILATION AND CURETTAGE OF UTERUS  1975  . FOOT SURGERY  1994  . HYSTEROSCOPY WITH D & C N/A 04/04/2019   Procedure: DILATATION AND CURETTAGE /HYSTEROSCOPY;  Surgeon: Everlene Farrier, MD;  Location: [place];  Service: Gynecology;  Laterality: N/A;  . INTRAUTERINE DEVICE (IUD) INSERTION N/A 04/04/2019    Procedure: INTRAUTERINE DEVICE (IUD) INSERTION;  Surgeon: Everlene Farrier, MD;  Location: [place];  Service: Gynecology;  Laterality: N/A;  Dr. Gaetano Net to bring IUD from office  . MASTECTOMY     right  . NM MYOCAR PERF WALL MOTION  12/01/2011   no ischemia; EF 22%  . [place]  . TEE WITHOUT CARDIOVERSION N/A 09/20/2019   Procedure: TRANSESOPHAGEAL ECHOCARDIOGRAM (TEE);  Surgeon: Sueanne Margarita, MD;  Location: [facility] ENDOSCOPY;  Service: Cardiovascular;  Laterality: N/A;  . TUNNELED VENOUS CATHETER PLACEMENT     removed     OB History   No obstetric history on file.     Family History  Problem Relation Age of Onset  . Heart attack Father   . Hypertension Brother   . Diabetes Brother     Social History   Tobacco Use  . Smoking status: Former Research scientist (life sciences)  . Smokeless tobacco: Never Used  Substance Use Topics  .  Alcohol use: No    Alcohol/week: 0.0 standard drinks  . Drug use: No    Home Medications Prior to Admission medications   Medication Sig Start Date End Date Taking? Authorizing Provider  allopurinol (ZYLOPRIM) 300 MG tablet Take 450 mg by mouth daily.    Yes [provider]  amiodarone (PACERONE) 400 MG tablet Take 1 tablet (400 mg total) by mouth daily for 14 days. 09/26/19 10/10/19 Yes Florencia Reasons, MD  apixaban (ELIQUIS) 5 MG TABS tablet Take 1 tablet (5 mg total) by mouth 2 (two) times daily. 09/26/19 10/26/19 Yes Florencia Reasons, MD  carvedilol (COREG) 25 MG tablet Take 1 tablet (25 mg total) by mouth 2 (two) times daily. 10/10/18  Yes Croitoru, Mihai, MD  Cholecalciferol (VITAMIN D) 2000 units CAPS Take 4,000 Units by mouth daily.    Yes [provider]  colchicine 0.6 MG tablet Take 0.6 mg by mouth daily as needed (for gout).    Yes [provider]  Insulin Human (INSULIN PUMP) SOLN Inject into the skin continuous. Novolin R   Yes [provider]  Lutein 20 MG CAPS Take 20 mg by mouth daily.    Yes [provider]  potassium  chloride SA (KLOR-CON) 20 MEQ tablet Take 2 tablets (40 mEq total) by mouth 2 (two) times daily. 09/26/19  Yes Florencia Reasons, MD  torsemide (DEMADEX) 100 MG tablet Take 0.5 tablets (50 mg total) by mouth daily. Take additional 50mg  x1 if weight above 255 lb. 09/27/19  Yes Florencia Reasons, MD  amiodarone (PACERONE) 200 MG tablet Take 1 tablet (200 mg total) by mouth daily. 10/10/19   Florencia Reasons, MD    Allergies    Lasix [furosemide], Ace inhibitors, Codeine, Digitalis, Levaquin [levofloxacin in d5w], Other, Statins, Xarelto [rivaroxaban], and Zyrtec [cetirizine]  Review of Systems   Review of Systems  Constitutional: Negative for activity change, chills and fever.  Respiratory: Negative for cough and shortness of breath.   Cardiovascular: Positive for chest pain.  Gastrointestinal: Negative for abdominal pain, blood in stool, diarrhea, nausea and vomiting.  Genitourinary: Positive for vaginal bleeding. Negative for dysuria and frequency.  Musculoskeletal: Negative for back pain.  Skin: Negative for rash.  Allergic/Immunologic: Negative for immunocompromised state.  Neurological: Negative for dizziness, syncope, weakness, numbness and headaches.  Psychiatric/Behavioral: Negative for confusion.    Physical Exam Updated Vital Signs Pulse (!) 101   Temp 98.2 F (36.8 C) (Oral)   SpO2 98%   Physical Exam Vitals and nursing note reviewed.  Constitutional:      General: She is not in acute distress.    Comments: Obese elderly female.  No acute distress.  She is anxious appearing and tearful.  HENT:     Head: Normocephalic.  Eyes:     Conjunctiva/sclera: Conjunctivae normal.  Cardiovascular:     Rate and Rhythm: Normal rate and regular rhythm.     Heart sounds: No murmur. No friction rub. No gallop.   Pulmonary:     Effort: Pulmonary effort is normal. No respiratory distress.     Comments: Bibasilar crackles.  No increased work of breathing.  No accessory muscle use.  No respiratory  distress. Abdominal:     General: There is no distension.     Palpations: Abdomen is soft. There is no mass.     Tenderness: There is abdominal tenderness. There is no right CVA tenderness, left CVA tenderness, guarding or rebound.     Hernia: No hernia is present.  Comments: Mild, diffuse tenderness to palpation throughout the abdomen.  No focal tenderness.  No peritoneal signs.  Abdomen is soft and nondistended.  No fluid wave.  Musculoskeletal:     Cervical back: Neck supple.  Skin:    General: Skin is warm.     Findings: No rash.     Comments: Large ecchymotic area noted to the right upper arm.  Neurological:     General: No focal deficit present.     Mental Status: She is alert and oriented to person, place, and time.  Psychiatric:        Mood and Affect: Affect is tearful.     ED Results / Procedures / Treatments   Labs (all labs ordered are listed, but only abnormal results are displayed) Labs Reviewed  COMPREHENSIVE METABOLIC PANEL - Abnormal; Notable for the following components:      Result Value   Sodium 129 (*)    Chloride 83 (*)    BUN 61 (*)    Creatinine, Ser 2.21 (*)    ALT 55 (*)    Total Bilirubin 2.7 (*)    GFR calc non Af Amer 22 (*)    GFR calc Af Amer 26 (*)    Anion gap 19 (*)    All other components within normal limits  CBC WITH DIFFERENTIAL/PLATELET - Abnormal; Notable for the following components:   RDW 15.6 (*)    All other components within normal limits  BRAIN NATRIURETIC PEPTIDE - Abnormal; Notable for the following components:   B Natriuretic Peptide 864.4 (*)    All other components within normal limits  CBG MONITORING, ED - Abnormal; Notable for the following components:   Glucose-Capillary 113 (*)    All other components within normal limits  TROPONIN I (HIGH SENSITIVITY) - Abnormal; Notable for the following components:   Troponin I (High Sensitivity) 29 (*)    All other components within normal limits  MAGNESIUM    EKG EKG  Interpretation  Date/Time:  Monday September 30 2019 04:00:21 EDT Ventricular Rate:  107 PR Interval:    QRS Duration: 200 QT Interval:  461 QTC Calculation: 616 R Axis:   -97 Text Interpretation: Ventricular-paced complexes No further rhythm analysis attempted due to paced rhythm Probable left atrial enlargement Nonspecific IVCD with LAD Baseline wander in lead(s) V4 V6 No significant change since last tracing Confirmed by Orpah Greek [phone]) on 09/30/2019 4:14:26 AM   Radiology DG Chest 2 View  Result Date: 09/30/2019 CLINICAL DATA:  Chest pain EXAM: CHEST - 2 VIEW COMPARISON:  09/17/2019 FINDINGS: Cardiomegaly with biventricular pacer/ICD. Postoperative right breast and axilla. Large superior mediastinal mass that correlates with a large thyroid nodule by 2015 ultrasound. The trachea is chronically deviated to the left without apparent narrowing. IMPRESSION: 1. Cardiomegaly and vascular congestion. 2. Known large right thyroid nodule with tracheal displacement. Electronically Signed   By: Monte Fantasia M.D.   On: 09/30/2019 04:47    Procedures Procedures (including critical care time)  Medications Ordered in ED Medications  LORazepam (ATIVAN) injection 0.5 mg (0.5 mg Intravenous Given 09/30/19 0529)    ED Course  I have reviewed the triage vital signs and the nursing notes.  Pertinent labs & imaging results that were available during my care of the patient were reviewed by me and considered in my medical decision making (see chart for details).    MDM Rules/Calculators/A&P  69 year old female with multiple chronic medical comorbidities, including, a history of remote breast cancer treated with Adriamycin and radiation, chronic combined CHF with EF of 20 to 25%, insulin-dependent diabetes mellitus, and history of polymorphic V. tach status post ICD presenting by EMS from home with multiple complaints.  The patient was recently discharged after a 9-day  hospital stay.  Although she presents with multiple complaints, they have all been ongoing since she was discharged.  Her primary concern is that she no longer feels that she is safe at home to care for herself since her most recent discharge.  The patient was seen and independently evaluated by Dr. Betsey Holiday who is in agreement with the work-up and plan.  Given patient's continued vaginal bleeding, hemoglobin was obtained, which was 14, improved from discharge.  CBC is otherwise unremarkable.  She does report improvement of vaginal bleeding since onset.  Given stable hemoglobin and vital signs, further work-up is not indicated at this time.  She did also expressed concern about a 5 pound weight gain since discharge despite taking her home torsemide.  Chest x-ray with cardiomegaly and vascular congestion.  However, she does not appear volume overloaded.  BNP 864, but she has no complaints of shortness of breath or increased work of breathing.  Initial troponin was 29, improved from her most recent hospitalization.  EKG unchanged from previous.  Repeat troponin is not indicated at this time.  Doubt ACS at this time.  Doubt PE as she is currently anticoagulated.  Upon further evaluation, the patient's primary concern is that she does not feel as if she can care for herself at home as she is increasingly weak from recent hospitalization.  She was assessed by case management during her most recent hospitalization, but she feels as if her status has declined.  Will repeat transition of care consults and PT/OT consult.   Given concerns for recent hypoglycemic episodes, CBG was obtained, which was 97.  She has an outpatient appointment with her endocrinologist in 2 days.  We will continue to monitor.  Patient care transferred to [facility] at the end of my shift. Patient presentation, ED course, and plan of care discussed with review of all pertinent labs and imaging. Please see his/her note for further details  regarding further ED course and disposition.  Final Clinical Impression(s) / ED Diagnoses Final diagnoses:  None    Rx / DC Orders ED Discharge Orders    None       Joanne Gavel, PA-C 09/30/19 0728    Orpah Greek, MD 10/01/19 [phone]

## 2019-09-30 NOTE — ED Notes (Signed)
Pt refused to have ultrasound performed, this nurse notified ED provider [place] PA

## 2019-09-30 NOTE — ED Notes (Signed)
ED provider Mendel Ryder PA made aware of pacemaker interrogation report.

## 2019-09-30 NOTE — Progress Notes (Signed)
  Pt seen informally due to transmission from her pacemaker showing ~ 48 hours of Atrial flutter.   Confirmed with patient that she has been taking her eliquis as prescribed, however LA thrombus noted on 3/12. She is not currently candidate for cardioversion or atrial pacing therapies.   Above discussed with Dr. Caryl Comes. Nothing to add from an electrophysiology perspective at this time.   Keep currently scheduled follow up with cards, and planned DCCV for next month.   Legrand Como [address] [place], PA-C  09/30/2019 9:56 AM

## 2019-10-01 DIAGNOSIS — E162 Hypoglycemia, unspecified: Secondary | ICD-10-CM | POA: Diagnosis not present

## 2019-10-01 DIAGNOSIS — I4811 Longstanding persistent atrial fibrillation: Secondary | ICD-10-CM | POA: Diagnosis not present

## 2019-10-01 DIAGNOSIS — E1169 Type 2 diabetes mellitus with other specified complication: Secondary | ICD-10-CM | POA: Diagnosis not present

## 2019-10-01 DIAGNOSIS — I5022 Chronic systolic (congestive) heart failure: Secondary | ICD-10-CM | POA: Diagnosis not present

## 2019-10-01 DIAGNOSIS — E785 Hyperlipidemia, unspecified: Secondary | ICD-10-CM | POA: Diagnosis not present

## 2019-10-01 DIAGNOSIS — I10 Essential (primary) hypertension: Secondary | ICD-10-CM | POA: Diagnosis not present

## 2019-10-02 ENCOUNTER — Telehealth: Payer: Self-pay | Admitting: Cardiovascular Disease

## 2019-10-02 DIAGNOSIS — I5043 Acute on chronic combined systolic (congestive) and diastolic (congestive) heart failure: Secondary | ICD-10-CM | POA: Diagnosis not present

## 2019-10-02 DIAGNOSIS — Z95810 Presence of automatic (implantable) cardiac defibrillator: Secondary | ICD-10-CM | POA: Diagnosis not present

## 2019-10-02 DIAGNOSIS — I4819 Other persistent atrial fibrillation: Secondary | ICD-10-CM | POA: Diagnosis not present

## 2019-10-02 DIAGNOSIS — E11649 Type 2 diabetes mellitus with hypoglycemia without coma: Secondary | ICD-10-CM | POA: Diagnosis not present

## 2019-10-02 DIAGNOSIS — Z794 Long term (current) use of insulin: Secondary | ICD-10-CM | POA: Diagnosis not present

## 2019-10-02 DIAGNOSIS — E1140 Type 2 diabetes mellitus with diabetic neuropathy, unspecified: Secondary | ICD-10-CM | POA: Diagnosis not present

## 2019-10-02 DIAGNOSIS — I472 Ventricular tachycardia: Secondary | ICD-10-CM | POA: Diagnosis not present

## 2019-10-02 DIAGNOSIS — I361 Nonrheumatic tricuspid (valve) insufficiency: Secondary | ICD-10-CM | POA: Diagnosis not present

## 2019-10-02 DIAGNOSIS — T451X5S Adverse effect of antineoplastic and immunosuppressive drugs, sequela: Secondary | ICD-10-CM | POA: Diagnosis not present

## 2019-10-02 DIAGNOSIS — Z7901 Long term (current) use of anticoagulants: Secondary | ICD-10-CM | POA: Diagnosis not present

## 2019-10-02 DIAGNOSIS — F329 Major depressive disorder, single episode, unspecified: Secondary | ICD-10-CM | POA: Diagnosis not present

## 2019-10-02 DIAGNOSIS — R269 Unspecified abnormalities of gait and mobility: Secondary | ICD-10-CM | POA: Diagnosis not present

## 2019-10-02 DIAGNOSIS — M6281 Muscle weakness (generalized): Secondary | ICD-10-CM | POA: Diagnosis not present

## 2019-10-02 DIAGNOSIS — Z853 Personal history of malignant neoplasm of breast: Secondary | ICD-10-CM | POA: Diagnosis not present

## 2019-10-02 DIAGNOSIS — I340 Nonrheumatic mitral (valve) insufficiency: Secondary | ICD-10-CM | POA: Diagnosis not present

## 2019-10-02 DIAGNOSIS — I427 Cardiomyopathy due to drug and external agent: Secondary | ICD-10-CM | POA: Diagnosis not present

## 2019-10-02 DIAGNOSIS — I130 Hypertensive heart and chronic kidney disease with heart failure and stage 1 through stage 4 chronic kidney disease, or unspecified chronic kidney disease: Secondary | ICD-10-CM | POA: Diagnosis not present

## 2019-10-02 DIAGNOSIS — N1832 Chronic kidney disease, stage 3b: Secondary | ICD-10-CM | POA: Diagnosis not present

## 2019-10-02 DIAGNOSIS — M1A09X0 Idiopathic chronic gout, multiple sites, without tophus (tophi): Secondary | ICD-10-CM | POA: Diagnosis not present

## 2019-10-02 DIAGNOSIS — I25119 Atherosclerotic heart disease of native coronary artery with unspecified angina pectoris: Secondary | ICD-10-CM | POA: Diagnosis not present

## 2019-10-02 DIAGNOSIS — Z9013 Acquired absence of bilateral breasts and nipples: Secondary | ICD-10-CM | POA: Diagnosis not present

## 2019-10-02 NOTE — Telephone Encounter (Signed)
New message:     Patient calling to state that some one need to be with her she can not see, she can not walk and she has to have her caregiver come with her.

## 2019-10-02 NOTE — Telephone Encounter (Signed)
Left a message for the patient to call back.  

## 2019-10-04 ENCOUNTER — Telehealth: Payer: Self-pay | Admitting: Cardiovascular Disease

## 2019-10-04 ENCOUNTER — Other Ambulatory Visit: Payer: Self-pay | Admitting: *Deleted

## 2019-10-04 ENCOUNTER — Telehealth: Payer: Self-pay | Admitting: Cardiology

## 2019-10-04 NOTE — Telephone Encounter (Signed)
She is doing very poorly since she has been in AFib and was unable to undergo cardioversion due to a left atrial thrombus.  She is scheduled for DC cardio version in another couple of weeks. If her Eliquis is stopped, that would further delay her cardioversion and improvement in symptoms. Please do not stop the Eliquis unless there is clear evidence of serious bleeding, such as a meaningful drop in Hemoglobin level.  If her rate i s well controlled can reduce the amiodarone to 200 mg daily. We were not able to achieve rate control without amiodarone and I think she will deteriorate further if the amio is stopped before cardioversion.

## 2019-10-04 NOTE — Telephone Encounter (Signed)
Called pt this morning to notify that she could bring her caregiver to her appt on 10/07/19. Pt crying hysterically saying "praise Jesus you called me" she states that for the last three days she has been having diarrhea and can't breathe. Since she has started the Carvedilol and Pacerone she has had constant diarrhea and indigestion and is unable to eat. She states she was having low blood sugars and her daughter was coming over right now because her sugar was low again. She reports her blood sugar is 98.  She states that her heart is beating so hard and she has "hot flashes" and is sweating. She states she is "bleeding to death." She reports that home health had come and that "everyone thinks I am in good health but it comes and goes". She states that she was in the ER Sunday night and  "the ER tried to kill me." She reports that she had an IUD in September and everything was good. And while she was in the ER they were taking her back for a transverse [place] but she refused and stated that she had already had 6 of them and that this wasn't her problem. She reported that they had mentioned cardioverting her at the hospital but she said that she cannot have that done.She states that she had to refuse everything there and the nurse came back in and said that if she was going to refuse everything then she should just go home.   She states that she is allergic to blood thinners and she was taken off her Xarelto and had the IUD placed and everything was fine until she was put on Eliquis. She states she has been having a period since March 8th and she is still bleeding. She reports her hemoglobin is 14.  She reports that her chest hurts and it feels like her heart is beating her to death and this sends her into a panic and she has the hot flash and gets clammy and burns up at the same time. She also reports that she is out of her Valium.I notified that we recommend if she is Having this SOB and pain that we do  recommend she go to the ER. She states that she does not want to go to the ER because they won't do anything.  She states that the [facility] shot put her back into Afib. She feels as if her Pacerone needs to be cut back. She reports that it is supposed to be dropped down to 1 pill on April 1st but she "wont survive until then".  Notified that I would send this message to Dr.C to be reviewed. Pt thankful and verbalized understanding. No appts with Afib clinic until Monday 3/29 and pt already has appt with Sande Rives PA on the same day.

## 2019-10-04 NOTE — Patient Outreach (Signed)
Telephone outreach.  Mrs. Mcginnity continues to have multiple complaints which have increased since our conversation on Monday, post ED visit on the weekend. She attributes this to her newly prescribed AMIODARONE 400 mg daily. Side effects she reports are weakness, nausea, vomiting, diarrhea, tremor, bounding hear beats, chills and hotflashes, dyspnea. She has had 2 nights that she has not been able to rest. She has called the nurse line. They coached her on pursed lipped breathing. She says if someone tells her to go to the ED she will not go because she had such a poor experience over the weekend.  She has called Dr. Carolan Clines office and left a message, he is not in the office.  She sounds so miserable, I've placed a call to the office and left a message regarding the above and to also  request AMIODARONE DOSE REDUCTION TODAY. It is ordered to be reduced on 10/10/19. She is scheuduled to see Sande Rives on Monday.  I am also concerned about her diarrhea as she had constipation on onset and first week of amiodarone. A fleets enema seemed to relieve the constipation however it went from consitpation immediately to diarrhea. Considering she is also having the other GI sxs, I wonder if she could have a high impaction. She would need a flat plate of the abd to investigate that. I will relay this information to the CVD provider that calls me back.  Eulah Pont. Myrtie Neither, MSN, GNP-BC Gerontological Nurse Practitioner [facility] [phone]  Note Kerin Ransom from pt's cardiologist did call her and advised her to go ahead and reduce her amiodarone to 200 mg daily.  I will call her again next week.  Eulah Pont. Myrtie Neither, MSN, [place] [facility] Gerontological Nurse Practitioner [facility] Management [phone]

## 2019-10-04 NOTE — Telephone Encounter (Signed)
I called the patient back after [facility] RN called [place] and said the patient had multiple issues with her medications.  Sara Walker has had difficulties with medications in the past.  She was recently discharged on Amiodarone 400 mg daily with plans to decrease this on 4/01 to 200 mg daily.  The was  patient was called be me and was tearful, multiple complaints in including diarrhea which she attributed to the Amiodarone.  I told her to go ahead and decrease the dose to 200 mg daily.  She has a f/u Monday and will keep this appointment.  Kerin Ransom PA-C 10/04/2019 1:02 PM

## 2019-10-04 NOTE — Telephone Encounter (Signed)
Called pt back to recommend that she also notify her PCP and OB that placed her IUD since Dr.Croitoru is out of office. She stated her PCP already knows because she had an appt for her diabetes on Tuesday and she states that her OB can't do anything because of her blood thinner. I reminded her that if she is having active chest pain we recommend that she seek immediate evaluation in the ED.  She still states that she does not want to go to the ED. She stated that a friend had mentioned she could call EMS and they could bring her Oxygen but she stated the EMI is stupid and they don't know anything and that they probably don't even have oxygen with them.  I notified that when Dr.C reviews the note we will contact her with his advice. She was thankful and stated that she would be at her appt on Monday if she makes it through the weekend. I notified that when our office closes at 5, there is an on call doctor she could speak to, she stated that it is hard for someone who is out of breath she did not want to talk to someone who didn't care about her. She thanked [place] for the help and had no other questions at this time.

## 2019-10-04 NOTE — Telephone Encounter (Signed)
Spoke to [facility] NP Deloria Lair, she is concerned regarding patients reaction to amiodarone, she requested to speak to a provider who has seen the patient. I went back- it seems Kerin Ransom, [place] has seen the patient in the last 6 months. I asked him if he would contact her to discuss the change in medication.  PA stated he would contact her.

## 2019-10-04 NOTE — Telephone Encounter (Signed)
Transferred call to [facility] - patient states she had received a call from our office.

## 2019-10-05 NOTE — Progress Notes (Signed)
Cardiology Office Note:    Date:  10/07/2019   ID:  Sara Walker, DOB 15-Nov-1950, MRN [id number]  PCP:  Sara Pretty, MD  Cardiologist:  Sara Klein, MD  Electrophysiologist:  None   Referring MD: Sara Pretty, MD   Chief Complaint: hospital follow-up of CHF and atrial fibrillation  History of Present Illness:    Sara Walker is a 69 y.o. female with a history of advanced chronic combined CHF/severe non-ischemic cardiomyopathy with EF of 25-30% s/p CRT-D (generative change in 02/2018 [employer] Jude Quadra Assura), paroxysmal atrial fibrillation on Eliquis, polymorphic VT in 2016 in the setting hypokalemia requiring ICD shocks with no recurrence, LBBB, hypertension, hyperlipidemia, type 2 diabetes mellitus on insulin pump, CKD stage IV, gout, and anxiety/depression who is followed by Dr. [name] and presents today for hospital follow-up of CHF and atrial fibrillation.  Patient has a history of severe non-ischemic cardiomyopathy felt to be secondary to chemotherapy (Adriamycin) for breast cancer. She has a LBBB and showed substantial improvement in clinical status after cardiac resynchronization therapy. However, LVEF remains 25 to 30% and she has continued to have exertional dyspnea (NYHA functional class II). She has been intolerant to multiple medications including ACEi, ARB, Entresto, and statins.   In September 12/2018, she underwent D&C and placement of an IUD due to persistent uterine bleeding. Her anticoagulation was stopped because of this.   She was last seen by Dr. [name] in 06/2019. At that visit, it was noted that she had 2 meaning episodes of atrial fibrillation since last visit, one in September and another in November. Each episode lasted about 6 hours with controlled ventricular rate.Interspersed between these episodes, she had very brief episodes of paroxysmal atrial tachycardia lasting for less than 10 seconds at a time. Per plan at that visit, plan was to try to get  manufacturer assistance for Xarelto now that uterine bleeding had calmed down.    Patient received dose of Moderna COVID vaccine on 09/09/2019 and felt very unwell after this with general weakness, vomiting, substernal chest pain, and intermittent tachycardia. Our office received alert from device company of prolonged episode of atrial tachycardia/atrial fibrillation from 09/13/2019 to 09/15/2019. She was advised to go to the ED for further evaluation and was ultimately admitted for further management of atrial fibrillation with RVR and acute on chronic CHF. BNP was elevated and chest x-ray was consistent with CHF. Echo showed LVEF of 25 to 30% with global hypokinesis and septal-lateral dyssynchrony consistent with loss of resynchronization pacing. She was initially diuresed with IV Lasix however had worsening renal function. Nephrology was consulted for assistance and PO Torsemide resumed first and eventually Metolazone was added. She went for TEE/DCCV on 09/20/2019; however, DCCV was not performed due to early forming thrombus in the left atrial appendage. Eliquis 5mg  twice daily was started (patient had previously declined anticoagulation). With atrial fibrillation with RVR, she had very poor CRT and was not tolerating this well. Rate control medications were not able to be uptitrated due to soft BP. Decision was made to load her with oral Amiodarone as it was felt that the benefit of slowing down ventricular rate and re-establishing CRT outweighted the small risk of embolic stroke with early chemical cardioversion. Lower rate limit on PPM was also increased to 80 bpm to allow for more frequent cardiac resynchronization which led to significant diuresis. Of note, she did report some nausea during admission but this was felt to be more likely from low CBG than Amiodarone. She was diuresed  7.6 L and 13 lbs throughout admission. Discharge weight was 250 lbs (considered dry weight). She was discharged on 09/26/2019 on  Torsemide 50mg  once daily with instructions to take twice daily if weight over 255 lbs, K-Dur 40 mEq twice daily, Coreg 25mg  twice daily, Amiodarone 400mg  daily unti 10/10/2019 and then 200mg  daily., and Eliquis 5mg  twice daily. Home Spironolactone was held due to low BP with plans to restart as outpatient if BP allows.   Patient presented back to the ED on 09/30/2019 with multiple complaints including low blood sugar at home and had pulled out her insulin pump as well as vaginal bleeding. She also reported 5 lb weight gain since discharge despite taking her Torsemide. BNP was elevated at 864 (down from 1,1100 three weeks prior) but she denied any shortness of breath and did not appear volume overloaded on exam. High-sensitivity troponin minimally elevated at 29 (down from 38 and 39 at recent admission). Hemoglobin stable at 14. Her primary concern was that she does not feel like she can care for herself at home due to increased weakness. PT/OT were consulted and home health was arranged before discharge from the ED.  Since ED visit, multiple phone notes with our office and [facility] NP. Patient has continued to feel unwell and reports continued diarrhea and vaginal bleeding which she attributes to Amiodarone. Therefore, she was advised that she could decrease Amiodarone to 200mg  daily. However, Dr. [name] recommended not stopping Amiodarone or Eliquis prior to repeat DCCV on 10/30/2019 as it is felt that it is primary the atrial fibrillation that is causing her to feel so bad due to lack of CRT.  Patient presents today for follow-up. Here with daughter. She states she is doing a little better since Amiodarone was decreased to 200mg  daily. She still notes some shortness of breath with activity but doing OK at rest. Also notes chronic chest pain that she describes as a fluttering sensation to me. No orthopnea or PND. Lower extremity edema has improved. She notes continue diarrhea but states it has improved some since  decreasing Amiodarone. Also has decreased appetite and is having problems with low blood sugar. She notes feeling weak, lightheadedness, and clammy when blood sugar drops but otherwise denies these symptoms. She is still having a lot of trouble sleeping a night. Daughter thinks anxiety is playing a role here and patient admits to being anxious especially at night time. She also continues to have vaginal bleeding but it seems to be stable right now. She also notes rash under pannus that is painful.    Past Medical History:  Diagnosis Date  . Angina   . Anxiety   . Arthritis   . Atrial fibrillation ([place])   . Automatic implantable cardioverter-defibrillator in situ   . Breast cancer ([place])   . Cardiomyopathy secondary to chemotherapy [facility])    a.  doxurubicin;  b. s/p SJM Quadra Assura CRT-D, model [id number], Ser# [id number]  . CHF (congestive heart failure) ([place])   . Chronic kidney disease    Stage 3  . Chronic systolic heart failure ([place])   . Coronary artery disease   . Depression   . DM2 (diabetes mellitus, type 2) ([place])   . Goiter   . Gout   . Hyperlipidemia   . Hypertension   . Invasive ductal carcinoma of breast ([place])   . LBBB (left bundle branch block)    evidnece of RBBB fall 2012  . 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 PAF (paroxysmal  atrial fibrillation) ([place])   . PONV (postoperative nausea and vomiting)    nausea post ICD placement relieved with Zofran  . Shortness of breath   . Syncope     Past Surgical History:  Procedure Laterality Date  . BI-VENTRICULAR IMPLANTABLE CARDIOVERTER DEFIBRILLATOR N/A 12/12/2011   Procedure: BI-VENTRICULAR IMPLANTABLE CARDIOVERTER DEFIBRILLATOR  (CRT-D);  Surgeon: Deboraha Sprang, MD;  Location: [facility] CATH LAB;  Service: Cardiovascular;  Laterality: N/A;  . BIV ICD GENERATOR CHANGEOUT N/A 02/23/2018   Procedure: BIV ICD GENERATOR CHANGEOUT;  Surgeon: Sara Klein, MD;  Location: [place] CV LAB;  Service:  Cardiovascular;  Laterality: N/A;  . BREAST SURGERY     Left mastectomy  . BUBBLE STUDY  09/20/2019   Procedure: BUBBLE STUDY;  Surgeon: Sueanne Margarita, MD;  Location: [place];  Service: Cardiovascular;;  . CARDIAC DEFIBRILLATOR PLACEMENT     [place]  . [place]  . DILATION AND CURETTAGE OF UTERUS  1975  . FOOT SURGERY  1994  . HYSTEROSCOPY WITH D & C N/A 04/04/2019   Procedure: DILATATION AND CURETTAGE /HYSTEROSCOPY;  Surgeon: Everlene Farrier, MD;  Location: [place];  Service: Gynecology;  Laterality: N/A;  . INTRAUTERINE DEVICE (IUD) INSERTION N/A 04/04/2019   Procedure: INTRAUTERINE DEVICE (IUD) INSERTION;  Surgeon: Everlene Farrier, MD;  Location: [place];  Service: Gynecology;  Laterality: N/A;  Dr. Gaetano Net to bring IUD from office  . MASTECTOMY     right  . NM MYOCAR PERF WALL MOTION  12/01/2011   no ischemia; EF 22%  . [place]  . TEE WITHOUT CARDIOVERSION N/A 09/20/2019   Procedure: TRANSESOPHAGEAL ECHOCARDIOGRAM (TEE);  Surgeon: Sueanne Margarita, MD;  Location: [facility] ENDOSCOPY;  Service: Cardiovascular;  Laterality: N/A;  . TUNNELED VENOUS CATHETER PLACEMENT     removed    Current Medications: Current Meds  Medication Sig  . allopurinol (ZYLOPRIM) 300 MG tablet Take 450 mg by mouth daily.   Derrill Memo ON 10/10/2019] amiodarone (PACERONE) 200 MG tablet Take 1 tablet (200 mg total) by mouth daily.  Marland Kitchen amiodarone (PACERONE) 400 MG tablet Take 1 tablet (400 mg total) by mouth daily for 14 days.  Marland Kitchen apixaban (ELIQUIS) 5 MG TABS tablet Take 1 tablet (5 mg total) by mouth 2 (two) times daily.  . carvedilol (COREG) 25 MG tablet Take 1 tablet (25 mg total) by mouth 2 (two) times daily.  . Cholecalciferol (VITAMIN D) 2000 units CAPS Take 4,000 Units by mouth daily.   . colchicine 0.6 MG tablet Take 0.6 mg by mouth daily as needed (for gout).   . Insulin Human (INSULIN PUMP) SOLN Inject into the skin continuous. Novolin R  . Lutein 20 MG CAPS Take 20 mg  by mouth daily.   . potassium chloride SA (KLOR-CON) 20 MEQ tablet Take 2 tablets (40 mEq total) by mouth 2 (two) times daily.  Marland Kitchen torsemide (DEMADEX) 100 MG tablet Take 0.5 tablets (50 mg total) by mouth daily. Take additional 50mg  x1 if weight above 255 lb.  . [DISCONTINUED] amiodarone (PACERONE) 200 MG tablet Take 1 tablet (200 mg total) by mouth daily.  . [DISCONTINUED] apixaban (ELIQUIS) 5 MG TABS tablet Take 1 tablet (5 mg total) by mouth 2 (two) times daily.     Allergies:   Lasix [furosemide], Ace inhibitors, Codeine, Digitalis, Levaquin [levofloxacin in d5w], Other, Statins, Xarelto [rivaroxaban], and Zyrtec [cetirizine]   Social History   Socioeconomic History  . Marital status: Divorced    Spouse  name: Not on file  . Number of children: Not on file  . Years of education: Not on file  . Highest education level: Not on file  Occupational History  . Not on file  Tobacco Use  . Smoking status: Former Research scientist (life sciences)  . Smokeless tobacco: Never Used  Substance and Sexual Activity  . Alcohol use: No    Alcohol/week: 0.0 standard drinks  . Drug use: No  . Sexual activity: Not Currently  Other Topics Concern  . Not on file  Social History Narrative  . Not on file   Social Determinants of Health   Financial Resource Strain: Low Risk   . Difficulty of Paying Living Expenses: Not hard at all  Food Insecurity: No Food Insecurity  . Worried About Charity fundraiser in the Last Year: Never true  . Ran Out of Food in the Last Year: Never true  Transportation Needs: No Transportation Needs  . Lack of Transportation (Medical): No  . Lack of Transportation (Non-Medical): No  Physical Activity: Inactive  . Days of Exercise per Week: 0 days  . Minutes of Exercise per Session: 0 min  Stress: Stress Concern Present  . Feeling of Stress : Very much  Social Connections: Slightly Isolated  . Frequency of Communication with Friends and Family: More than three times a week  . Frequency of  Social Gatherings with Friends and Family: More than three times a week  . Attends Religious Services: 1 to 4 times per year  . Active Member of Clubs or Organizations: Yes  . Attends Archivist Meetings: 1 to 4 times per year  . Marital Status: Divorced     Family History: The patient's family history includes Diabetes in her brother; Heart attack in her father; Hypertension in her brother.  ROS:   Please see the history of present illness.     EKGs/Labs/Other Studies Reviewed:    The following studies were reviewed today:  Echocardiogram 09/17/2019: Impressions: 1. There is profound septal-lateral dyssynchrony, consistent with loss of  resynchronization pacing. Left ventricular ejection fraction, by  estimation, is 25 to 30%. The left ventricle has severely decreased  function. The left ventricle demonstrates  global hypokinesis. The left ventricular internal cavity size was severely  dilated. Left ventricular diastolic function could not be evaluated. Left  ventricular diastolic function could not be evaluated.  2. Right ventricular systolic function is mildly reduced. The right  ventricular size is mildly enlarged. There is moderately elevated  pulmonary artery systolic pressure.  3. Left atrial size was severely dilated.  4. The mitral valve is grossly normal. Moderate to severe mitral valve  regurgitation.  5. Tricuspid valve regurgitation is mild to moderate.  _______________  TEE 09/20/2019: Impressions: 1. Left ventricular ejection fraction, by estimation, is 25 to 30%. The  left ventricle has severely decreased function. The left ventricle  demonstrates global hypokinesis. The left ventricular internal cavity size  was severely dilated.  2. Right ventricular systolic function is mildly reduced. The right  ventricular size is mildly enlarged.  3. There was a loosely formed thrombus in the body of the LA appendage.  Left atrial size was severely  dilated.  4. Right atrial size was mildly dilated.  5. Tricuspid valve regurgitation is moderate to severe.  6. The mitral valve is normal in structure. Moderate to severe mitral  valve regurgitation likley related to MV leaflet teathering and annular  dilatation. THe MR wraps around the LA but does not appear to enter  the  pulmonary vein by colorflow doppler but  there is intermittent pulmonary vein flow reversal with PW doppler.  7. The aortic valve is tricuspid. Aortic valve regurgitation is not  visualized. Mild aortic valve sclerosis is present, with no evidence of  aortic valve stenosis.  8. Evidence of atrial level shunting detected by color flow Doppler.  Agitated saline contrast bubble study was positive with shunting observed  within 3-6 cardiac cycles suggestive of interatrial shunt. There is a  small patent foramen ovale with  bidirectional shunting across atrial septum.  9. Mild to moderate aortic atherosclerosis is present in the ascending  and descending aorta. aorta.  6. The aortic valve is normal in structure. Aortic valve regurgitation is  not visualized.  7. The inferior vena cava is dilated in size with <50% respiratory  variability, suggesting right atrial pressure of 15 mmHg.   EKG:  EKG ordered today. EKG personally reviewed and demonstrates AV pacing with underlying sinus rhyhtm.  Recent Labs: 09/17/2019: TSH 3.627 09/30/2019: ALT 55; B Natriuretic Peptide 864.4; BUN 61; Creatinine, Ser 2.21; Hemoglobin 14.0; Magnesium 2.2; Platelets 277; Potassium 4.8; Sodium 129  Recent Lipid Panel No results found for: CHOL, TRIG, HDL, CHOLHDL, VLDL, LDLCALC, LDLDIRECT  Physical Exam:    Vital Signs: BP (!) 146/88   Pulse 60   Ht 5\' 8"  (1.727 m)   Wt 257 lb (116.6 kg)   SpO2 99%   BMI 39.08 kg/m     Wt Readings from Last 3 Encounters:  10/07/19 257 lb (116.6 kg)  09/26/19 250 lb 8 oz (113.6 kg)  07/02/19 250 lb 9.6 oz (113.7 kg)     General: 69 y.o.  obese Caucasian female in no acute distress. HEENT: Normocephalic and atraumatic.  Neck: Supple. JVD difficult to assess due to body habitus. Heart: RRR. Distinct S1 and S2. No murmurs, gallops, or rubs. Radial pulses 2+ and equal bilaterally. Lungs: No increased work of breathing. Clear to ausculation bilaterally. No wheezes, rhonchi, or rales.  Abdomen: Soft, obese, and non-tender to palpation. Bowel sounds present. Extremities: Trace to 1+ lower extremity edema.    Neuro: Alert and oriented x3. No focal deficits. Psych: Normal affect. Responds appropriately.   Assessment:    1. Chronic combined systolic and diastolic heart failure ([place])   2. Non-ischemic cardiomyopathy (HCC)   3. Paroxysmal atrial fibrillation ([place])   4. History of Polymorphic VT   5. Essential hypertension   6. Hypercholesterolemia   7. Type 2 diabetes mellitus with obesity ([place])   8. Diarrhea, unspecified type   9. Vaginal bleeding     Plan:    Chronic Systolic CHF/Non-Ischemic Cardiomyopathy s/p CRT-D - Cardiomyopathy felt to be due to chemotherapy (Anthracycline) for breath cancer.  - Recently admitted with acute on chronic CHF secondary to atrial fibrillation with RVR.  - Echo from recent admission showed LVEF of  25 to 30% with global hypokinesis and septal-lateral dyssynchrony consistent with loss of resynchronization pacing. - Patient appears euvolemic on exam. However, thoracic impedance remains depressed on interrogation of device today. - Continue Torsemide 50mg  daily. Continue to take an additional 50mg  if weight >255 lbs. Suspect improvement in diuresis and CHF symptoms now that patient is back in sinus rhythm.  - Continue Coreg 25mg  twice daily.  - Unable to tolerate ACEi/ARB/Entresto.  - If renal function stable, can restart Spironolactone but may need to start back at 25mg  rather than 50mg  daily. - Continue to monitor daily weights and sodium/fluid restrictions.  - Will recheck  BMET today. Of  note, patient states she self adjusted her K-Dur. I think she is only taking 20 mEq (rather than 40 mEq) twice daily. Will see what potassium is on BMET and make adjustments if needed.   Atrial Fibrillation  - Patient back in normal sinus rhythm today per EKG. Dr. [name] came and interrogated [facility] Jude device and confirmed that patient was back in sinus rhythm since 3/25.  - TSH normal during recent admission. - Continue Amiodarone 200mg  daily. - Continue Coreg 25mg  twice daily.  - Can cancel DCCV scheduled for 10/30/2019. Continue chronic anticoagulation with Eliquis 5mg  twice daily.  - Will check CBC today.   Chronic Chest Pain - Patient has chronic chest pain that is stable. She has had this for years.  - No additional work-up at this time.   History of Polymorphic VT - History of Torsades in 2016 in setting of major electrolyte abnormality and CHF exacerbation.  - No recurrence.  Hypertension - BP mildly elevated.  - Continue Coreg 25mg  twice daily.  - If renal function stable, can restart Spironolactone as above.   Hyperlipidemia - Intolerant to statins in the past.  - PCP has recommended PCSK9 inhibitor which I agree with but patient has not wanted to add any additional medications. Did not discuss today due to other acute problems.  Type 2 Diabetes Mellitus  - On Insulin pump. Patient states blood sugars have been low recently. - Recommended calling Endocrinologist.   Diarrhea - Patient states diarrhea has improved some since decreasing dose of Amiodarone.  - Could just be a side effect of the medication; however, I also wonder if there is an underlying GI process going on given reports of nausea prior to recent admission, constipation, decreased appetite, and gas/burping. - Advised patient to follow-up with PCP.   Vaginal Bleeding - S/p D&C and placement of IUD for persistent uterine bleeding.  Patient states she has had her period since 09/09/2019. Bleeding seems to be  stable.  - Will check CBC and make sure hemoglobin is stable.  - Follow-up with GYN.  Rash - Patient notes rash under pannus that is painful.  - Advised patient to continue to have home health RN monitor this and reach out to PCP for further recommendations.   Disposition: Follow up in 1 month.   Medication Adjustments/Labs and Tests Ordered: Current medicines are reviewed at length with the patient today.  Concerns regarding medicines are outlined above.  Orders Placed This Encounter  Procedures  . Basic metabolic panel  . CBC  . EKG 12-Lead   Meds ordered this encounter  Medications  . amiodarone (PACERONE) 200 MG tablet    Sig: Take 1 tablet (200 mg total) by mouth daily.    Dispense:  30 tablet    Refill:  0  . apixaban (ELIQUIS) 5 MG TABS tablet    Sig: Take 1 tablet (5 mg total) by mouth 2 (two) times daily.    Dispense:  60 tablet    Refill:  0    Patient Instructions  Medication Instructions:  CONTINUE WITH CURRENT MEDICATIONS. NO CHANGES.  *If you need a refill on your cardiac medications before your next appointment, please call your pharmacy*   Lab Work: TODAY: BMET, CBC If you have labs (blood work) drawn today and your tests are completely normal, you will receive your results only by: Marland Kitchen MyChart Message (if you have MyChart) OR . A paper copy in the mail If you have any lab test  that is abnormal or we need to change your treatment, we will call you to review the results.   Follow-Up: At [facility], you and your health needs are our priority.  As part of our continuing mission to provide you with exceptional heart care, we have created designated Provider Care Teams.  These Care Teams include your primary Cardiologist (physician) and Advanced Practice Providers (APPs -  Physician Assistants and Nurse Practitioners) who all work together to provide you with the care you need, when you need it.  We recommend signing up for the patient portal called  "MyChart".  Sign up information is provided on this After Visit Summary.  MyChart is used to connect with patients for Virtual Visits (Telemedicine).  Patients are able to view lab/test results, encounter notes, upcoming appointments, etc.  Non-urgent messages can be sent to your provider as well.   To learn more about what you can do with MyChart, go to [email or web address].    Your next appointment:   11/08/19 AT 2:45PM WITH HAO MENG PA  Other Instructions APPT WITH ANGELA DUKE CANCELED AND COVID SCREEN CANCELED.     Signed, Darreld Mclean, PA-C  10/07/2019 1:40 PM    [facility]

## 2019-10-06 NOTE — Telephone Encounter (Signed)
Thanks for the heads up, [employer].

## 2019-10-07 ENCOUNTER — Encounter: Payer: Self-pay | Admitting: Student

## 2019-10-07 ENCOUNTER — Other Ambulatory Visit: Payer: Self-pay

## 2019-10-07 ENCOUNTER — Ambulatory Visit (INDEPENDENT_AMBULATORY_CARE_PROVIDER_SITE_OTHER): Payer: Medicare Other | Admitting: Student

## 2019-10-07 VITALS — BP 146/88 | HR 60 | Ht 68.0 in | Wt 257.0 lb

## 2019-10-07 DIAGNOSIS — E1169 Type 2 diabetes mellitus with other specified complication: Secondary | ICD-10-CM

## 2019-10-07 DIAGNOSIS — I472 Ventricular tachycardia, unspecified: Secondary | ICD-10-CM

## 2019-10-07 DIAGNOSIS — R21 Rash and other nonspecific skin eruption: Secondary | ICD-10-CM

## 2019-10-07 DIAGNOSIS — R197 Diarrhea, unspecified: Secondary | ICD-10-CM | POA: Diagnosis not present

## 2019-10-07 DIAGNOSIS — I428 Other cardiomyopathies: Secondary | ICD-10-CM

## 2019-10-07 DIAGNOSIS — I10 Essential (primary) hypertension: Secondary | ICD-10-CM | POA: Diagnosis not present

## 2019-10-07 DIAGNOSIS — E7800 Pure hypercholesterolemia, unspecified: Secondary | ICD-10-CM

## 2019-10-07 DIAGNOSIS — I5023 Acute on chronic systolic (congestive) heart failure: Secondary | ICD-10-CM | POA: Diagnosis not present

## 2019-10-07 DIAGNOSIS — I4819 Other persistent atrial fibrillation: Secondary | ICD-10-CM

## 2019-10-07 DIAGNOSIS — R079 Chest pain, unspecified: Secondary | ICD-10-CM

## 2019-10-07 DIAGNOSIS — N939 Abnormal uterine and vaginal bleeding, unspecified: Secondary | ICD-10-CM

## 2019-10-07 DIAGNOSIS — I5042 Chronic combined systolic (congestive) and diastolic (congestive) heart failure: Secondary | ICD-10-CM

## 2019-10-07 DIAGNOSIS — I480 Paroxysmal atrial fibrillation: Secondary | ICD-10-CM | POA: Diagnosis not present

## 2019-10-07 DIAGNOSIS — E669 Obesity, unspecified: Secondary | ICD-10-CM

## 2019-10-07 MED ORDER — APIXABAN 5 MG PO TABS: 5.0000 mg | ORAL_TABLET | Freq: Two times a day (BID) | ORAL | 0 refills | Status: DC

## 2019-10-07 MED ORDER — AMIODARONE HCL 200 MG PO TABS: 200.0000 mg | ORAL_TABLET | Freq: Every day | ORAL | 0 refills | Status: DC

## 2019-10-07 NOTE — Patient Instructions (Signed)
Medication Instructions:  CONTINUE WITH CURRENT MEDICATIONS. NO CHANGES.  *If you need a refill on your cardiac medications before your next appointment, please call your pharmacy*   Lab Work: TODAY: BMET, CBC If you have labs (blood work) drawn today and your tests are completely normal, you will receive your results only by: Marland Kitchen MyChart Message (if you have MyChart) OR . A paper copy in the mail If you have any lab test that is abnormal or we need to change your treatment, we will call you to review the results.   Follow-Up: At [facility], you and your health needs are our priority.  As part of our continuing mission to provide you with exceptional heart care, we have created designated Provider Care Teams.  These Care Teams include your primary Cardiologist (physician) and Advanced Practice Providers (APPs -  Physician Assistants and Nurse Practitioners) who all work together to provide you with the care you need, when you need it.  We recommend signing up for the patient portal called "MyChart".  Sign up information is provided on this After Visit Summary.  MyChart is used to connect with patients for Virtual Visits (Telemedicine).  Patients are able to view lab/test results, encounter notes, upcoming appointments, etc.  Non-urgent messages can be sent to your provider as well.   To learn more about what you can do with MyChart, go to [email or web address].    Your next appointment:   11/08/19 AT 2:45PM WITH HAO MENG PA  Other Instructions APPT WITH ANGELA DUKE CANCELED AND COVID SCREEN CANCELED.

## 2019-10-07 NOTE — Telephone Encounter (Signed)
Patient has appointment today with Sande Rives, PA.

## 2019-10-07 NOTE — Progress Notes (Signed)
Comprehensive CRT-D interrogation shows return to normal rhythm (atrial paced-biventricular paced) since March 25. There is a concomitant marked improvement in the success rate of effective CRT which is now approaching 100%, and the average heart rate is now in the 60s. ECG confirms effective left ventricular pacing lead positive R wave in V1 and V2. Thoracic impedance remains depressed, but seems to be showing improvement over the last couple of days. Suspect we will see further improvement in heart failure symptoms and parameters now that she is getting effective CRT. It would not be surprising however if there are recurrent episodes of paroxysmal atrial fibrillation, at least until amiodarone reaches full effect in several more weeks.

## 2019-10-08 DIAGNOSIS — I5043 Acute on chronic combined systolic (congestive) and diastolic (congestive) heart failure: Secondary | ICD-10-CM | POA: Diagnosis not present

## 2019-10-08 DIAGNOSIS — N1832 Chronic kidney disease, stage 3b: Secondary | ICD-10-CM | POA: Diagnosis not present

## 2019-10-08 DIAGNOSIS — I4819 Other persistent atrial fibrillation: Secondary | ICD-10-CM | POA: Diagnosis not present

## 2019-10-08 DIAGNOSIS — E11649 Type 2 diabetes mellitus with hypoglycemia without coma: Secondary | ICD-10-CM | POA: Diagnosis not present

## 2019-10-08 DIAGNOSIS — M6281 Muscle weakness (generalized): Secondary | ICD-10-CM | POA: Diagnosis not present

## 2019-10-08 DIAGNOSIS — I130 Hypertensive heart and chronic kidney disease with heart failure and stage 1 through stage 4 chronic kidney disease, or unspecified chronic kidney disease: Secondary | ICD-10-CM | POA: Diagnosis not present

## 2019-10-08 LAB — CBC
Hematocrit: 41.2 % (ref 34.0–46.6)
Hemoglobin: 12.7 g/dL (ref 11.1–15.9)
MCH: 29.1 pg (ref 26.6–33.0)
MCHC: 30.8 g/dL — ABNORMAL LOW (ref 31.5–35.7)
MCV: 94 fL (ref 79–97)
Platelets: 256 10*3/uL (ref 150–450)
RBC: 4.37 x10E6/uL (ref 3.77–5.28)
RDW: 14.9 % (ref 11.7–15.4)
WBC: 7 10*3/uL (ref 3.4–10.8)

## 2019-10-08 LAB — BASIC METABOLIC PANEL
BUN/Creatinine Ratio: 24 (ref 12–28)
BUN: 47 mg/dL — ABNORMAL HIGH (ref 8–27)
CO2: 21 mmol/L (ref 20–29)
Calcium: 9.4 mg/dL (ref 8.7–10.3)
Chloride: 92 mmol/L — ABNORMAL LOW (ref 96–106)
Creatinine, Ser: 2 mg/dL — ABNORMAL HIGH (ref 0.57–1.00)
GFR calc Af Amer: 29 mL/min/{1.73_m2} — ABNORMAL LOW (ref >59)
GFR calc non Af Amer: 25 mL/min/{1.73_m2} — ABNORMAL LOW (ref >59)
Glucose: 63 mg/dL — ABNORMAL LOW (ref 65–99)
Potassium: 5 mmol/L (ref 3.5–5.2)
Sodium: 131 mmol/L — ABNORMAL LOW (ref 134–144)

## 2019-10-10 ENCOUNTER — Ambulatory Visit: Payer: Self-pay | Admitting: *Deleted

## 2019-10-10 ENCOUNTER — Telehealth: Payer: Self-pay | Admitting: Student

## 2019-10-10 ENCOUNTER — Other Ambulatory Visit: Payer: Self-pay

## 2019-10-10 DIAGNOSIS — E162 Hypoglycemia, unspecified: Secondary | ICD-10-CM | POA: Diagnosis not present

## 2019-10-10 DIAGNOSIS — I4819 Other persistent atrial fibrillation: Secondary | ICD-10-CM | POA: Diagnosis not present

## 2019-10-10 DIAGNOSIS — I4811 Longstanding persistent atrial fibrillation: Secondary | ICD-10-CM | POA: Diagnosis not present

## 2019-10-10 DIAGNOSIS — M6281 Muscle weakness (generalized): Secondary | ICD-10-CM | POA: Diagnosis not present

## 2019-10-10 DIAGNOSIS — I5022 Chronic systolic (congestive) heart failure: Secondary | ICD-10-CM | POA: Diagnosis not present

## 2019-10-10 DIAGNOSIS — I5043 Acute on chronic combined systolic (congestive) and diastolic (congestive) heart failure: Secondary | ICD-10-CM | POA: Diagnosis not present

## 2019-10-10 DIAGNOSIS — I130 Hypertensive heart and chronic kidney disease with heart failure and stage 1 through stage 4 chronic kidney disease, or unspecified chronic kidney disease: Secondary | ICD-10-CM | POA: Diagnosis not present

## 2019-10-10 DIAGNOSIS — E1165 Type 2 diabetes mellitus with hyperglycemia: Secondary | ICD-10-CM | POA: Diagnosis not present

## 2019-10-10 DIAGNOSIS — N1832 Chronic kidney disease, stage 3b: Secondary | ICD-10-CM | POA: Diagnosis not present

## 2019-10-10 DIAGNOSIS — E11649 Type 2 diabetes mellitus with hypoglycemia without coma: Secondary | ICD-10-CM | POA: Diagnosis not present

## 2019-10-10 NOTE — Telephone Encounter (Signed)
Contacted patient, regarding blood work- she has a question regarding her potassium pill- she states that she is still taking 4 a day, and thinks this is too much, she would like to come down off of it, if possible.  Home health nurse just left- and stated that she has 2+ pitting edema. Still taking 1/2 50 mg torsemide weight is now:  252.2lbs   Since visit on Monday patient states she has started these new medications:  >Off insulin pump- starting new insulin once every 24 hours- calling Endocrinology at 4:30 today to see if this is something she should stay on. >Nystatin cream abdominal fold yeast infection.   Please advise, thank you!

## 2019-10-10 NOTE — Telephone Encounter (Signed)
Contacted patient- advised of message.  Patient verbalized understanding will decrease potassium.

## 2019-10-10 NOTE — Telephone Encounter (Signed)
New message ° ° ° ° °Returning a call to the nurse to get lab results °

## 2019-10-10 NOTE — Telephone Encounter (Signed)
So she is still taking 40 mEq twice daily? During office visit, she suggested that she may have decreased this dose on her own. Potassium is on the higher side, so I think it would be OK for her to go to [place]  20 mEq twice daily.   I would continue Torsemide 50mg  daily and then an additional 50mg  if weight > 255lbs for now. She should elevate her legs as much as possible and limit her sodium intake. Also can try compression stockings.   Thank you!

## 2019-10-15 DIAGNOSIS — N1832 Chronic kidney disease, stage 3b: Secondary | ICD-10-CM | POA: Diagnosis not present

## 2019-10-15 DIAGNOSIS — I130 Hypertensive heart and chronic kidney disease with heart failure and stage 1 through stage 4 chronic kidney disease, or unspecified chronic kidney disease: Secondary | ICD-10-CM | POA: Diagnosis not present

## 2019-10-15 DIAGNOSIS — I4819 Other persistent atrial fibrillation: Secondary | ICD-10-CM | POA: Diagnosis not present

## 2019-10-15 DIAGNOSIS — I5043 Acute on chronic combined systolic (congestive) and diastolic (congestive) heart failure: Secondary | ICD-10-CM | POA: Diagnosis not present

## 2019-10-15 DIAGNOSIS — E11649 Type 2 diabetes mellitus with hypoglycemia without coma: Secondary | ICD-10-CM | POA: Diagnosis not present

## 2019-10-15 DIAGNOSIS — M6281 Muscle weakness (generalized): Secondary | ICD-10-CM | POA: Diagnosis not present

## 2019-10-16 DIAGNOSIS — I4819 Other persistent atrial fibrillation: Secondary | ICD-10-CM | POA: Diagnosis not present

## 2019-10-16 DIAGNOSIS — I5043 Acute on chronic combined systolic (congestive) and diastolic (congestive) heart failure: Secondary | ICD-10-CM | POA: Diagnosis not present

## 2019-10-16 DIAGNOSIS — M6281 Muscle weakness (generalized): Secondary | ICD-10-CM | POA: Diagnosis not present

## 2019-10-16 DIAGNOSIS — I130 Hypertensive heart and chronic kidney disease with heart failure and stage 1 through stage 4 chronic kidney disease, or unspecified chronic kidney disease: Secondary | ICD-10-CM | POA: Diagnosis not present

## 2019-10-16 DIAGNOSIS — N1832 Chronic kidney disease, stage 3b: Secondary | ICD-10-CM | POA: Diagnosis not present

## 2019-10-16 DIAGNOSIS — E11649 Type 2 diabetes mellitus with hypoglycemia without coma: Secondary | ICD-10-CM | POA: Diagnosis not present

## 2019-10-17 DIAGNOSIS — E11649 Type 2 diabetes mellitus with hypoglycemia without coma: Secondary | ICD-10-CM | POA: Diagnosis not present

## 2019-10-17 DIAGNOSIS — I5043 Acute on chronic combined systolic (congestive) and diastolic (congestive) heart failure: Secondary | ICD-10-CM | POA: Diagnosis not present

## 2019-10-17 DIAGNOSIS — M6281 Muscle weakness (generalized): Secondary | ICD-10-CM | POA: Diagnosis not present

## 2019-10-17 DIAGNOSIS — N1832 Chronic kidney disease, stage 3b: Secondary | ICD-10-CM | POA: Diagnosis not present

## 2019-10-17 DIAGNOSIS — I130 Hypertensive heart and chronic kidney disease with heart failure and stage 1 through stage 4 chronic kidney disease, or unspecified chronic kidney disease: Secondary | ICD-10-CM | POA: Diagnosis not present

## 2019-10-17 DIAGNOSIS — I4819 Other persistent atrial fibrillation: Secondary | ICD-10-CM | POA: Diagnosis not present

## 2019-10-21 DIAGNOSIS — M6281 Muscle weakness (generalized): Secondary | ICD-10-CM | POA: Diagnosis not present

## 2019-10-21 DIAGNOSIS — I5043 Acute on chronic combined systolic (congestive) and diastolic (congestive) heart failure: Secondary | ICD-10-CM | POA: Diagnosis not present

## 2019-10-21 DIAGNOSIS — I130 Hypertensive heart and chronic kidney disease with heart failure and stage 1 through stage 4 chronic kidney disease, or unspecified chronic kidney disease: Secondary | ICD-10-CM | POA: Diagnosis not present

## 2019-10-21 DIAGNOSIS — E11649 Type 2 diabetes mellitus with hypoglycemia without coma: Secondary | ICD-10-CM | POA: Diagnosis not present

## 2019-10-21 DIAGNOSIS — L720 Epidermal cyst: Secondary | ICD-10-CM | POA: Diagnosis not present

## 2019-10-21 DIAGNOSIS — N1832 Chronic kidney disease, stage 3b: Secondary | ICD-10-CM | POA: Diagnosis not present

## 2019-10-21 DIAGNOSIS — I4819 Other persistent atrial fibrillation: Secondary | ICD-10-CM | POA: Diagnosis not present

## 2019-10-22 ENCOUNTER — Ambulatory Visit: Payer: Medicare Other | Admitting: Physician Assistant

## 2019-10-22 DIAGNOSIS — M6281 Muscle weakness (generalized): Secondary | ICD-10-CM | POA: Diagnosis not present

## 2019-10-22 DIAGNOSIS — E11649 Type 2 diabetes mellitus with hypoglycemia without coma: Secondary | ICD-10-CM | POA: Diagnosis not present

## 2019-10-22 DIAGNOSIS — N1832 Chronic kidney disease, stage 3b: Secondary | ICD-10-CM | POA: Diagnosis not present

## 2019-10-22 DIAGNOSIS — I5043 Acute on chronic combined systolic (congestive) and diastolic (congestive) heart failure: Secondary | ICD-10-CM | POA: Diagnosis not present

## 2019-10-22 DIAGNOSIS — I4819 Other persistent atrial fibrillation: Secondary | ICD-10-CM | POA: Diagnosis not present

## 2019-10-22 DIAGNOSIS — I130 Hypertensive heart and chronic kidney disease with heart failure and stage 1 through stage 4 chronic kidney disease, or unspecified chronic kidney disease: Secondary | ICD-10-CM | POA: Diagnosis not present

## 2019-10-23 DIAGNOSIS — E1165 Type 2 diabetes mellitus with hyperglycemia: Secondary | ICD-10-CM | POA: Diagnosis not present

## 2019-10-26 ENCOUNTER — Other Ambulatory Visit (HOSPITAL_COMMUNITY): Payer: Medicare Other

## 2019-10-28 DIAGNOSIS — I4819 Other persistent atrial fibrillation: Secondary | ICD-10-CM | POA: Diagnosis not present

## 2019-10-28 DIAGNOSIS — N1832 Chronic kidney disease, stage 3b: Secondary | ICD-10-CM | POA: Diagnosis not present

## 2019-10-28 DIAGNOSIS — E119 Type 2 diabetes mellitus without complications: Secondary | ICD-10-CM | POA: Diagnosis not present

## 2019-10-28 DIAGNOSIS — I5043 Acute on chronic combined systolic (congestive) and diastolic (congestive) heart failure: Secondary | ICD-10-CM | POA: Diagnosis not present

## 2019-10-28 DIAGNOSIS — M6281 Muscle weakness (generalized): Secondary | ICD-10-CM | POA: Diagnosis not present

## 2019-10-28 DIAGNOSIS — I130 Hypertensive heart and chronic kidney disease with heart failure and stage 1 through stage 4 chronic kidney disease, or unspecified chronic kidney disease: Secondary | ICD-10-CM | POA: Diagnosis not present

## 2019-10-28 DIAGNOSIS — E11649 Type 2 diabetes mellitus with hypoglycemia without coma: Secondary | ICD-10-CM | POA: Diagnosis not present

## 2019-10-29 ENCOUNTER — Other Ambulatory Visit: Payer: Self-pay

## 2019-10-30 ENCOUNTER — Encounter (HOSPITAL_COMMUNITY): Payer: Self-pay

## 2019-10-30 ENCOUNTER — Ambulatory Visit (HOSPITAL_COMMUNITY): Admit: 2019-10-30 | Payer: Medicare Other | Admitting: Cardiovascular Disease

## 2019-10-30 DIAGNOSIS — I4819 Other persistent atrial fibrillation: Secondary | ICD-10-CM | POA: Diagnosis not present

## 2019-10-30 DIAGNOSIS — I5043 Acute on chronic combined systolic (congestive) and diastolic (congestive) heart failure: Secondary | ICD-10-CM | POA: Diagnosis not present

## 2019-10-30 DIAGNOSIS — N1832 Chronic kidney disease, stage 3b: Secondary | ICD-10-CM | POA: Diagnosis not present

## 2019-10-30 DIAGNOSIS — I130 Hypertensive heart and chronic kidney disease with heart failure and stage 1 through stage 4 chronic kidney disease, or unspecified chronic kidney disease: Secondary | ICD-10-CM | POA: Diagnosis not present

## 2019-10-30 DIAGNOSIS — E119 Type 2 diabetes mellitus without complications: Secondary | ICD-10-CM | POA: Diagnosis not present

## 2019-10-30 DIAGNOSIS — E1165 Type 2 diabetes mellitus with hyperglycemia: Secondary | ICD-10-CM | POA: Diagnosis not present

## 2019-10-30 SURGERY — CARDIOVERSION
Anesthesia: General

## 2019-10-31 ENCOUNTER — Telehealth: Payer: Self-pay | Admitting: Cardiovascular Disease

## 2019-10-31 NOTE — Telephone Encounter (Signed)
Please take metolazone 2.5 mg , one time dose only, preferably 30 minutes before her next dose of torsemide. If she does not have any metolazone at home, can Rx 10 tabs, but make sure she understands to take only once as needed for bad fluid gain episodes. We can check labs at her 4/30 appt.

## 2019-10-31 NOTE — Telephone Encounter (Signed)
Pt c/o swelling: STAT is pt has developed SOB within 24 hours  1) How much weight have you gained and in what time span? 9 lbs in about 1 month  2) If swelling, where is the swelling located? Ankles  3) Are you currently taking a fluid pill? Yes  4) Are you currently SOB? Yes  5) Do you have a log of your daily weights (if so, list)?  10/31/19: 258.8 lbs 10/30/19: 257.2 lbs 10/29/19: 256.4 lbs 10/28/19: 257.2 lbs 10/27/19: 257.8 lbs  6) Have you gained 3 pounds in a day or 5 pounds in a week? No  7) Have you traveled recently? No

## 2019-10-31 NOTE — Telephone Encounter (Addendum)
Pt called to report that her weight has been steadily increasing..she is very SOB with minimal exertion.. she cannot sleep at night she has to sit up for rest.    10/31/19: 258.8 lbs 10/30/19: 257.2 lbs 10/29/19: 256.4 lbs 10/28/19: 257.2 lbs 10/27/19: 257.8 lbs  She has been taking extra Torsemide for the added weight according to her instructions: 50 mg bid for 4 days and has not had much relief.   Pt was seen at her med management appt yesterday for her Diabetes and she was at the car and when they tried to help her out of the car her O2sat was less that 80... after sitting in a wheelchair her O2sat improved to 90 after 20 min it was then 97%... she says she cannot do much physical exertion at all without giving out and not being able to breathe.   She says her ankle edema has been worsening and the [facility] nurse told her it was between a +1 and +2 pitting edema.   Pt is seeing nephrology 11/15/19.   She denies eating much sodium. She says she has had a poor appetite and issues with her diabetes so she barely eats or drinks much at all for the past several days.   Pt is seeing Almyra Deforest PA 11/08/19.. I will forward to Dr. Sallyanne Kuster for his review.   I advised her that she may need more acute care in the ED but will wait for the MD recommendations.

## 2019-10-31 NOTE — Telephone Encounter (Signed)
Called and spoke with the pt and she has Metolazone 2.5 mg at home... she verbalized understanding of her instructions and will call back tomorrow afternoon if no relief prior to the weekend.

## 2019-11-01 DIAGNOSIS — I472 Ventricular tachycardia: Secondary | ICD-10-CM | POA: Diagnosis not present

## 2019-11-01 DIAGNOSIS — M1A09X0 Idiopathic chronic gout, multiple sites, without tophus (tophi): Secondary | ICD-10-CM | POA: Diagnosis not present

## 2019-11-01 DIAGNOSIS — M6281 Muscle weakness (generalized): Secondary | ICD-10-CM | POA: Diagnosis not present

## 2019-11-01 DIAGNOSIS — F329 Major depressive disorder, single episode, unspecified: Secondary | ICD-10-CM | POA: Diagnosis not present

## 2019-11-01 DIAGNOSIS — I5043 Acute on chronic combined systolic (congestive) and diastolic (congestive) heart failure: Secondary | ICD-10-CM | POA: Diagnosis not present

## 2019-11-01 DIAGNOSIS — I25119 Atherosclerotic heart disease of native coronary artery with unspecified angina pectoris: Secondary | ICD-10-CM | POA: Diagnosis not present

## 2019-11-01 DIAGNOSIS — Z7901 Long term (current) use of anticoagulants: Secondary | ICD-10-CM | POA: Diagnosis not present

## 2019-11-01 DIAGNOSIS — I427 Cardiomyopathy due to drug and external agent: Secondary | ICD-10-CM | POA: Diagnosis not present

## 2019-11-01 DIAGNOSIS — E1140 Type 2 diabetes mellitus with diabetic neuropathy, unspecified: Secondary | ICD-10-CM | POA: Diagnosis not present

## 2019-11-01 DIAGNOSIS — Z95810 Presence of automatic (implantable) cardiac defibrillator: Secondary | ICD-10-CM | POA: Diagnosis not present

## 2019-11-01 DIAGNOSIS — T451X5S Adverse effect of antineoplastic and immunosuppressive drugs, sequela: Secondary | ICD-10-CM | POA: Diagnosis not present

## 2019-11-01 DIAGNOSIS — Z794 Long term (current) use of insulin: Secondary | ICD-10-CM | POA: Diagnosis not present

## 2019-11-01 DIAGNOSIS — I361 Nonrheumatic tricuspid (valve) insufficiency: Secondary | ICD-10-CM | POA: Diagnosis not present

## 2019-11-01 DIAGNOSIS — Z9013 Acquired absence of bilateral breasts and nipples: Secondary | ICD-10-CM | POA: Diagnosis not present

## 2019-11-01 DIAGNOSIS — I4819 Other persistent atrial fibrillation: Secondary | ICD-10-CM | POA: Diagnosis not present

## 2019-11-01 DIAGNOSIS — R269 Unspecified abnormalities of gait and mobility: Secondary | ICD-10-CM | POA: Diagnosis not present

## 2019-11-01 DIAGNOSIS — I130 Hypertensive heart and chronic kidney disease with heart failure and stage 1 through stage 4 chronic kidney disease, or unspecified chronic kidney disease: Secondary | ICD-10-CM | POA: Diagnosis not present

## 2019-11-01 DIAGNOSIS — Z853 Personal history of malignant neoplasm of breast: Secondary | ICD-10-CM | POA: Diagnosis not present

## 2019-11-01 DIAGNOSIS — I340 Nonrheumatic mitral (valve) insufficiency: Secondary | ICD-10-CM | POA: Diagnosis not present

## 2019-11-01 DIAGNOSIS — E11649 Type 2 diabetes mellitus with hypoglycemia without coma: Secondary | ICD-10-CM | POA: Diagnosis not present

## 2019-11-01 DIAGNOSIS — N1832 Chronic kidney disease, stage 3b: Secondary | ICD-10-CM | POA: Diagnosis not present

## 2019-11-05 DIAGNOSIS — I5043 Acute on chronic combined systolic (congestive) and diastolic (congestive) heart failure: Secondary | ICD-10-CM | POA: Diagnosis not present

## 2019-11-05 DIAGNOSIS — I130 Hypertensive heart and chronic kidney disease with heart failure and stage 1 through stage 4 chronic kidney disease, or unspecified chronic kidney disease: Secondary | ICD-10-CM | POA: Diagnosis not present

## 2019-11-05 DIAGNOSIS — M6281 Muscle weakness (generalized): Secondary | ICD-10-CM | POA: Diagnosis not present

## 2019-11-05 DIAGNOSIS — E11649 Type 2 diabetes mellitus with hypoglycemia without coma: Secondary | ICD-10-CM | POA: Diagnosis not present

## 2019-11-05 DIAGNOSIS — I4819 Other persistent atrial fibrillation: Secondary | ICD-10-CM | POA: Diagnosis not present

## 2019-11-05 DIAGNOSIS — N1832 Chronic kidney disease, stage 3b: Secondary | ICD-10-CM | POA: Diagnosis not present

## 2019-11-08 ENCOUNTER — Other Ambulatory Visit: Payer: Self-pay | Admitting: Student

## 2019-11-08 ENCOUNTER — Other Ambulatory Visit: Payer: Self-pay | Admitting: Cardiovascular Disease

## 2019-11-08 ENCOUNTER — Encounter: Payer: Self-pay | Admitting: Physician Assistant

## 2019-11-08 ENCOUNTER — Ambulatory Visit (INDEPENDENT_AMBULATORY_CARE_PROVIDER_SITE_OTHER): Payer: Medicare Other | Admitting: Physician Assistant

## 2019-11-08 ENCOUNTER — Other Ambulatory Visit: Payer: Self-pay

## 2019-11-08 VITALS — BP 110/63 | HR 80 | Temp 97.3°F | Resp 20 | Ht 68.0 in | Wt 253.4 lb

## 2019-11-08 DIAGNOSIS — E785 Hyperlipidemia, unspecified: Secondary | ICD-10-CM

## 2019-11-08 DIAGNOSIS — I480 Paroxysmal atrial fibrillation: Secondary | ICD-10-CM

## 2019-11-08 DIAGNOSIS — I10 Essential (primary) hypertension: Secondary | ICD-10-CM

## 2019-11-08 DIAGNOSIS — N184 Chronic kidney disease, stage 4 (severe): Secondary | ICD-10-CM

## 2019-11-08 DIAGNOSIS — E119 Type 2 diabetes mellitus without complications: Secondary | ICD-10-CM

## 2019-11-08 DIAGNOSIS — I5042 Chronic combined systolic (congestive) and diastolic (congestive) heart failure: Secondary | ICD-10-CM

## 2019-11-08 DIAGNOSIS — Z95810 Presence of automatic (implantable) cardiac defibrillator: Secondary | ICD-10-CM

## 2019-11-08 DIAGNOSIS — I428 Other cardiomyopathies: Secondary | ICD-10-CM

## 2019-11-08 DIAGNOSIS — I5022 Chronic systolic (congestive) heart failure: Secondary | ICD-10-CM | POA: Diagnosis not present

## 2019-11-08 NOTE — Patient Instructions (Signed)
Medication Instructions:  Your physician recommends that you continue on your current medications as directed. Please refer to the Current Medication list given to you today.  *If you need a refill on your cardiac medications before your next appointment, please call your pharmacy*  Lab Work: Your physician recommends that you return for lab work in [place] If you have labs (blood work) drawn today and your tests are completely normal, you will receive your results only by: Marland Kitchen MyChart Message (if you have MyChart) OR . A paper copy in the mail If you have any lab test that is abnormal or we need to change your treatment, we will call you to review the results.  Testing/Procedures: NONE ordered at this time of appointment   Follow-Up: At [facility], you and your health needs are our priority.  As part of our continuing mission to provide you with exceptional heart care, we have created designated Provider Care Teams.  These Care Teams include your primary Cardiologist (physician) and Advanced Practice Providers (APPs -  Physician Assistants and Nurse Practitioners) who all work together to provide you with the care you need, when you need it.  We recommend signing up for the patient portal called "MyChart".  Sign up information is provided on this After Visit Summary.  MyChart is used to connect with patients for Virtual Visits (Telemedicine).  Patients are able to view lab/test results, encounter notes, upcoming appointments, etc.  Non-urgent messages can be sent to your provider as well.   To learn more about what you can do with MyChart, go to [email or web address].    Your next appointment:   AS SCHEDULED 12/16/19 at 8AM   The format for your next appointment:   In Person  Provider:   Sanda Klein, MD  Other Instructions

## 2019-11-08 NOTE — Progress Notes (Signed)
Cardiology Office Note:    Date:  11/10/2019   ID:  Sara Walker, DOB December 12, 1950, MRN [id number]  PCP:  Deland Pretty, MD  Cardiologist:  Sanda Klein, MD  Electrophysiologist:  None   Referring MD: Deland Pretty, MD   Chief Complaint  Patient presents with  . Follow-up    seen for Dr. Sallyanne Kuster    History of Present Illness:    Sara Walker is a 69 y.o. female with a hx of chronic combined CHF, NICM with EF 25-30% s/p CRT-D, PAF on Eliquis, ICD shocks for polymorphic VT in 2016 in the setting of hypokalemia, LBBB, HTN, HLD, DM II on insulin pump, CKD stage IV, anxiety and depression.  Her nonischemic cardiomyopathy was felt to be related to chemotherapy.  She has been intolerant of multiple medications including ACE inhibitor, ARB, Entresto and statin.  Despite CRT-D therapy, patient's ejection fraction remains low at 25 to 30% on the most recent echocardiogram.  She had a significant generalized weakness, vomiting and substernal pain after receiving Materna COVID-19 vaccine on 09/09/2019.  Our office received alert from the device company of prolonged episode of atrial tachycardia from 3/5-2 3/7.  She was advised to go to the ED for further evaluation.  Patient was ultimately admitted for atrial fibrillation with RVR and acute on chronic combined CHF.  Echocardiogram obtained during the admission showed EF 25 to 30% with global hypokinesis and septal lateral dyssynergy consistent with loss of resynchronization pacing.  She was treated with IV Lasix, however had worsening renal function.  Nephrology was consulted and she was treated with torsemide and metolazone.  She underwent TEE on 09/20/2019, DCCV was unable to be performed due to early forming thrombus in the left atrial appendage.  Eliquis was started and she was loaded with amiodarone therapy.  During the admission, she was diuresed 7.6 L and lost roughly 13 pounds.  Discharge weight was 250 pounds which is her new dry weight.  She was  discharged on torsemide 50 mg daily with instruction to take another dose if daily weight is greater than 255 pounds.  Recent deviceinterrogation shows 100% success rate of of effective CRT.  It also revealed patient is now back in sinus rhythm.  Patient presents today for cardiology office visit.  Recently her weight has increased to 258 pounds, however after a single dose of 5 mg metolazone, her weight has dropped back down to 245 pounds.  She did not take any extra dose of potassium at the time of metolazone dose.  I plan to obtain basic metabolic panel and magnesium level.  She is due to follow-up with her nephrologist Dr. Corliss Parish next Friday on 11/15/2019.  On physical exam, she appears to be euvolemic.  Heart rate is regular on physical exam.  I recommended continue on the current medication and only consider taking the metolazone if her weight is greater than 255 pounds and that she does not respond to the additional dose of torsemide.   Past Medical History:  Diagnosis Date  . Angina   . Anxiety   . Arthritis   . Atrial fibrillation ([place])   . Automatic implantable cardioverter-defibrillator in situ   . Breast cancer ([place])   . Cardiomyopathy secondary to chemotherapy [facility])    a.  doxurubicin;  b. s/p SJM Quadra Assura CRT-D, model [id number], Ser# [id number]  . CHF (congestive heart failure) ([place])   . Chronic kidney disease    Stage 3  . Chronic systolic heart failure ([place])   .  Coronary artery disease   . Depression   . DM2 (diabetes mellitus, type 2) ([place])   . Goiter   . Gout   . Hyperlipidemia   . Hypertension   . Invasive ductal carcinoma of breast ([place])   . LBBB (left bundle branch block)    evidnece of RBBB fall 2012  . 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 PAF (paroxysmal atrial fibrillation) ([place])   . PONV (postoperative nausea and vomiting)    nausea post ICD placement relieved with Zofran  . Shortness of breath   . Syncope     Past  Surgical History:  Procedure Laterality Date  . BI-VENTRICULAR IMPLANTABLE CARDIOVERTER DEFIBRILLATOR N/A 12/12/2011   Procedure: BI-VENTRICULAR IMPLANTABLE CARDIOVERTER DEFIBRILLATOR  (CRT-D);  Surgeon: Deboraha Sprang, MD;  Location: [facility] CATH LAB;  Service: Cardiovascular;  Laterality: N/A;  . BIV ICD GENERATOR CHANGEOUT N/A 02/23/2018   Procedure: BIV ICD GENERATOR CHANGEOUT;  Surgeon: Sanda Klein, MD;  Location: [place] CV LAB;  Service: Cardiovascular;  Laterality: N/A;  . BREAST SURGERY     Left mastectomy  . BUBBLE STUDY  09/20/2019   Procedure: BUBBLE STUDY;  Surgeon: Sueanne Margarita, MD;  Location: [place];  Service: Cardiovascular;;  . CARDIAC DEFIBRILLATOR PLACEMENT     [place]  . [place]  . DILATION AND CURETTAGE OF UTERUS  1975  . FOOT SURGERY  1994  . HYSTEROSCOPY WITH D & C N/A 04/04/2019   Procedure: DILATATION AND CURETTAGE /HYSTEROSCOPY;  Surgeon: Everlene Farrier, MD;  Location: [place];  Service: Gynecology;  Laterality: N/A;  . INTRAUTERINE DEVICE (IUD) INSERTION N/A 04/04/2019   Procedure: INTRAUTERINE DEVICE (IUD) INSERTION;  Surgeon: Everlene Farrier, MD;  Location: [place];  Service: Gynecology;  Laterality: N/A;  Dr. Gaetano Net to bring IUD from office  . MASTECTOMY     right  . NM MYOCAR PERF WALL MOTION  12/01/2011   no ischemia; EF 22%  . [place]  . TEE WITHOUT CARDIOVERSION N/A 09/20/2019   Procedure: TRANSESOPHAGEAL ECHOCARDIOGRAM (TEE);  Surgeon: Sueanne Margarita, MD;  Location: [facility] ENDOSCOPY;  Service: Cardiovascular;  Laterality: N/A;  . TUNNELED VENOUS CATHETER PLACEMENT     removed    Current Medications: Current Meds  Medication Sig  . allopurinol (ZYLOPRIM) 300 MG tablet Take 450 mg by mouth daily.   Marland Kitchen amiodarone (PACERONE) 200 MG tablet Take 1 tablet (200 mg total) by mouth daily.  . carvedilol (COREG) 25 MG tablet Take 1 tablet (25 mg total) by mouth 2 (two) times daily.  . Cholecalciferol (VITAMIN D)  2000 units CAPS Take 4,000 Units by mouth daily.   . colchicine 0.6 MG tablet Take 0.6 mg by mouth daily as needed (for gout).   Marland Kitchen ELIQUIS 5 MG TABS tablet Take 1 tablet by mouth twice daily  . Insulin Human (INSULIN PUMP) SOLN Inject into the skin continuous. Novolin R  . Lutein 20 MG CAPS Take 20 mg by mouth daily.   Marland Kitchen NOVOLIN R 100 UNIT/ML injection USE AS DIRECTED MAX DAILY DOSE 200 UNITS VIA INSULIN PUMP FOR 30 DAYS  . nystatin cream (MYCOSTATIN) Apply 1 application topically 2 (two) times daily.  . potassium chloride SA (KLOR-CON) 20 MEQ tablet Take 2 tablets (40 mEq total) by mouth 2 (two) times daily.  Marland Kitchen torsemide (DEMADEX) 100 MG tablet Take 0.5 tablets (50 mg total) by mouth daily. Take additional 50mg  x1 if weight above 255 lb.  Allergies:   Lasix [furosemide], Ace inhibitors, Codeine, Digitalis, Levaquin [levofloxacin in d5w], Other, Statins, Xarelto [rivaroxaban], and Zyrtec [cetirizine]   Social History   Socioeconomic History  . Marital status: Divorced    Spouse name: Not on file  . Number of children: Not on file  . Years of education: Not on file  . Highest education level: Not on file  Occupational History  . Not on file  Tobacco Use  . Smoking status: Former Research scientist (life sciences)  . Smokeless tobacco: Never Used  Substance and Sexual Activity  . Alcohol use: No    Alcohol/week: 0.0 standard drinks  . Drug use: No  . Sexual activity: Not Currently  Other Topics Concern  . Not on file  Social History Narrative  . Not on file   Social Determinants of Health   Financial Resource Strain: Low Risk   . Difficulty of Paying Living Expenses: Not hard at all  Food Insecurity: No Food Insecurity  . Worried About Charity fundraiser in the Last Year: Never true  . Ran Out of Food in the Last Year: Never true  Transportation Needs: No Transportation Needs  . Lack of Transportation (Medical): No  . Lack of Transportation (Non-Medical): No  Physical Activity: Inactive  . Days  of Exercise per Week: 0 days  . Minutes of Exercise per Session: 0 min  Stress: Stress Concern Present  . Feeling of Stress : Very much  Social Connections: Slightly Isolated  . Frequency of Communication with Friends and Family: More than three times a week  . Frequency of Social Gatherings with Friends and Family: More than three times a week  . Attends Religious Services: 1 to 4 times per year  . Active Member of Clubs or Organizations: Yes  . Attends Archivist Meetings: 1 to 4 times per year  . Marital Status: Divorced     Family History: The patient's family history includes Diabetes in her brother; Heart attack in her father; Hypertension in her brother.  ROS:   Please see the history of present illness.     All other systems reviewed and are negative.  EKGs/Labs/Other Studies Reviewed:    The following studies were reviewed today:  Echo 09/19/2019 1. Left ventricular ejection fraction, by estimation, is 25 to 30%. The  left ventricle has severely decreased function. The left ventricle  demonstrates global hypokinesis. The left ventricular internal cavity size  was severely dilated. There is mild  concentric left ventricular hypertrophy. Left ventricular diastolic  parameters are indeterminate.  2. Mild mitral valve regurgitation.  3. The aortic valve is tricuspid.  4. There is moderately elevated pulmonary artery systolic pressure.  5. The inferior vena cava is dilated in size with <50% respiratory  variability, suggesting right atrial pressure of 15 mmHg.   EKG:  EKG is not ordered today.    Recent Labs: 09/17/2019: TSH 3.627 09/30/2019: ALT 55; B Natriuretic Peptide 864.4 10/07/2019: Hemoglobin 12.7; Platelets 256 11/08/2019: BUN 39; Creatinine, Ser 1.30; Magnesium 1.7; Potassium 3.0; Sodium 133  Recent Lipid Panel No results found for: CHOL, TRIG, HDL, CHOLHDL, VLDL, LDLCALC, LDLDIRECT  Physical Exam:    VS:  BP 110/63   Pulse 80   Temp (!) 97.3  F (36.3 C)   Resp 20   Ht 5\' 8"  (1.727 m)   Wt 253 lb 6.4 oz (114.9 kg)   SpO2 96%   BMI 38.53 kg/m     Wt Readings from Last 3 Encounters:  11/08/19 253  lb 6.4 oz (114.9 kg)  10/07/19 257 lb (116.6 kg)  09/26/19 250 lb 8 oz (113.6 kg)     GEN:  Well nourished, well developed in no acute distress HEENT: Normal NECK: No JVD; No carotid bruits LYMPHATICS: No lymphadenopathy CARDIAC: RRR, no murmurs, rubs, gallops RESPIRATORY:  Clear to auscultation without rales, wheezing or rhonchi  ABDOMEN: Soft, non-tender, non-distended MUSCULOSKELETAL:  No edema; No deformity  SKIN: Warm and dry NEUROLOGIC:  Alert and oriented x 3 PSYCHIATRIC:  Normal affect   ASSESSMENT:    1. Chronic combined systolic (congestive) and diastolic (congestive) heart failure ([place])   2. NICM (nonischemic cardiomyopathy) ([place])   3. Presence of cardiac resynchronization therapy defibrillator (CRT-D)   4. PAF (paroxysmal atrial fibrillation) ([place])   5. Essential hypertension   6. Hyperlipidemia LDL goal <70   7. Controlled type 2 diabetes mellitus without complication, without long-term current use of insulin ([place])   8. Chronic kidney disease (CKD), stage IV (severe) (HCC)    PLAN:    In order of problems listed above:  1. Chronic combined systolic and diastolic heart failure: Euvolemic on physical exam.  She had recent volume overload and was treated with a single dose of metolazone.  She has managed to lose roughly 10 pounds since then.  2. Nonischemic cardiomyopathy s/p CRT-D: Stable on recent interrogation  3. PAF: Maintaining sinus rhythm on recent device interrogation.  On Eliquis.  Continue amiodarone  4. Hypertension: Blood pressure stable  5. Hyperlipidemia: will defer lab work to primary care provider  6. DM2: Managed by primary care provider  7. CKD stage IV: Managed by nephrology service.  Has follow-up next week with Dr. Moshe Cipro.   Medication Adjustments/Labs and Tests  Ordered: Current medicines are reviewed at length with the patient today.  Concerns regarding medicines are outlined above.  Orders Placed This Encounter  Procedures  . Basic metabolic panel  . Magnesium   No orders of the defined types were placed in this encounter.   Patient Instructions  Medication Instructions:  Your physician recommends that you continue on your current medications as directed. Please refer to the Current Medication list given to you today.  *If you need a refill on your cardiac medications before your next appointment, please call your pharmacy*  Lab Work: Your physician recommends that you return for lab work in [place] If you have labs (blood work) drawn today and your tests are completely normal, you will receive your results only by: Marland Kitchen MyChart Message (if you have MyChart) OR . A paper copy in the mail If you have any lab test that is abnormal or we need to change your treatment, we will call you to review the results.  Testing/Procedures: NONE ordered at this time of appointment   Follow-Up: At [facility], you and your health needs are our priority.  As part of our continuing mission to provide you with exceptional heart care, we have created designated Provider Care Teams.  These Care Teams include your primary Cardiologist (physician) and Advanced Practice Providers (APPs -  Physician Assistants and Nurse Practitioners) who all work together to provide you with the care you need, when you need it.  We recommend signing up for the patient portal called "MyChart".  Sign up information is provided on this After Visit Summary.  MyChart is used to connect with patients for Virtual Visits (Telemedicine).  Patients are able to view lab/test results, encounter notes, upcoming appointments, etc.  Non-urgent messages  can be sent to your provider as well.   To learn more about what you can do with MyChart, go to [email or web address].     Your next appointment:   AS SCHEDULED 12/16/19 at [place]   The format for your next appointment:   In Person  Provider:   Sanda Klein, MD  Other Instructions      Signed, Sara Walker, [place]  11/10/2019 11:49 PM    Mobile

## 2019-11-08 NOTE — Telephone Encounter (Signed)
Dx A.Fib [phone] Female Scr 2.0 (CrCl = 17ml/min) Wt 116.6kg

## 2019-11-09 LAB — BASIC METABOLIC PANEL
BUN/Creatinine Ratio: 30 — ABNORMAL HIGH (ref 12–28)
BUN: 39 mg/dL — ABNORMAL HIGH (ref 8–27)
CO2: 32 mmol/L — ABNORMAL HIGH (ref 20–29)
Calcium: 9.8 mg/dL (ref 8.7–10.3)
Chloride: 84 mmol/L — ABNORMAL LOW (ref 96–106)
Creatinine, Ser: 1.3 mg/dL — ABNORMAL HIGH (ref 0.57–1.00)
GFR calc Af Amer: 49 mL/min/{1.73_m2} — ABNORMAL LOW (ref >59)
GFR calc non Af Amer: 42 mL/min/{1.73_m2} — ABNORMAL LOW (ref >59)
Glucose: 192 mg/dL — ABNORMAL HIGH (ref 65–99)
Potassium: 3 mmol/L — ABNORMAL LOW (ref 3.5–5.2)
Sodium: 133 mmol/L — ABNORMAL LOW (ref 134–144)

## 2019-11-09 LAB — MAGNESIUM: Magnesium: 1.7 mg/dL (ref 1.6–2.3)

## 2019-11-10 ENCOUNTER — Encounter: Payer: Self-pay | Admitting: Physician Assistant

## 2019-11-12 ENCOUNTER — Other Ambulatory Visit: Payer: Self-pay | Admitting: Student

## 2019-11-12 DIAGNOSIS — I4819 Other persistent atrial fibrillation: Secondary | ICD-10-CM | POA: Diagnosis not present

## 2019-11-12 DIAGNOSIS — M79675 Pain in left toe(s): Secondary | ICD-10-CM | POA: Diagnosis not present

## 2019-11-12 DIAGNOSIS — I5043 Acute on chronic combined systolic (congestive) and diastolic (congestive) heart failure: Secondary | ICD-10-CM | POA: Diagnosis not present

## 2019-11-12 DIAGNOSIS — I130 Hypertensive heart and chronic kidney disease with heart failure and stage 1 through stage 4 chronic kidney disease, or unspecified chronic kidney disease: Secondary | ICD-10-CM | POA: Diagnosis not present

## 2019-11-12 DIAGNOSIS — N1832 Chronic kidney disease, stage 3b: Secondary | ICD-10-CM | POA: Diagnosis not present

## 2019-11-12 DIAGNOSIS — B351 Tinea unguium: Secondary | ICD-10-CM | POA: Diagnosis not present

## 2019-11-13 ENCOUNTER — Telehealth: Payer: Self-pay | Admitting: Physician Assistant

## 2019-11-13 NOTE — Telephone Encounter (Signed)
This is Dr. Croitoru's pt 

## 2019-11-13 NOTE — Progress Notes (Signed)
Renal function improved, potassium low, I have instructed the patient to return to 23meq BID dosing

## 2019-11-13 NOTE — Telephone Encounter (Signed)
I recently saw the patient for cardiology follow-up, at which time her weight is stable.  When I called the patient regarding her lab work today, she says she has gained roughly 5 pounds and has increasing shortness of breath, orthopnea and PND.  She says she has been feeling poorly since March and has not noticed much improvement despite multiple office visit.  I urged her to go to the emergency room however she wanted to wait to see Dr. Corliss Parish this Friday.  She says she has bad experience in the emergency room and that the does not wish to go back there.  I told her that it sounds to me that her volume overload issue has not resolved.  At this time, she is on a very very high dose of torsemide 100 mg daily.  I am hesitant to further titrate this medication.  She may respond to metolazone again, however this is likely not a long-term solution she feels very poorly afterward.  I think this is the case that need to be treated in the hospital instead.  She is adamant that she does not want to go there at this time.  She is aware that her overall condition may worsen without hospital treatment and even if she goes to her nephrology appointment, her nephrologist may also recommend she goes to [facility] ED as well.  I did increase her potassium to 40 meq twice daily today.

## 2019-11-15 DIAGNOSIS — I5042 Chronic combined systolic (congestive) and diastolic (congestive) heart failure: Secondary | ICD-10-CM | POA: Diagnosis not present

## 2019-11-15 DIAGNOSIS — N1832 Chronic kidney disease, stage 3b: Secondary | ICD-10-CM | POA: Diagnosis not present

## 2019-11-15 DIAGNOSIS — E876 Hypokalemia: Secondary | ICD-10-CM | POA: Diagnosis not present

## 2019-11-18 DIAGNOSIS — I5043 Acute on chronic combined systolic (congestive) and diastolic (congestive) heart failure: Secondary | ICD-10-CM | POA: Diagnosis not present

## 2019-11-18 DIAGNOSIS — N1832 Chronic kidney disease, stage 3b: Secondary | ICD-10-CM | POA: Diagnosis not present

## 2019-11-18 DIAGNOSIS — M6281 Muscle weakness (generalized): Secondary | ICD-10-CM | POA: Diagnosis not present

## 2019-11-18 DIAGNOSIS — E11649 Type 2 diabetes mellitus with hypoglycemia without coma: Secondary | ICD-10-CM | POA: Diagnosis not present

## 2019-11-18 DIAGNOSIS — I130 Hypertensive heart and chronic kidney disease with heart failure and stage 1 through stage 4 chronic kidney disease, or unspecified chronic kidney disease: Secondary | ICD-10-CM | POA: Diagnosis not present

## 2019-11-18 DIAGNOSIS — I4819 Other persistent atrial fibrillation: Secondary | ICD-10-CM | POA: Diagnosis not present

## 2019-11-19 ENCOUNTER — Other Ambulatory Visit: Payer: Self-pay | Admitting: *Deleted

## 2019-11-19 MED ORDER — APIXABAN 5 MG PO TABS: 5.0000 mg | ORAL_TABLET | Freq: Two times a day (BID) | ORAL | 3 refills | Status: DC

## 2019-11-19 MED ORDER — APIXABAN 5 MG PO TABS: 5.0000 mg | ORAL_TABLET | Freq: Two times a day (BID) | ORAL | 3 refills | Status: AC

## 2019-11-20 ENCOUNTER — Telehealth: Payer: Self-pay | Admitting: *Deleted

## 2019-11-20 NOTE — Telephone Encounter (Signed)
Eliquis assistance has been faxed.  

## 2019-11-22 DIAGNOSIS — M6281 Muscle weakness (generalized): Secondary | ICD-10-CM | POA: Diagnosis not present

## 2019-11-22 DIAGNOSIS — N1832 Chronic kidney disease, stage 3b: Secondary | ICD-10-CM | POA: Diagnosis not present

## 2019-11-22 DIAGNOSIS — I130 Hypertensive heart and chronic kidney disease with heart failure and stage 1 through stage 4 chronic kidney disease, or unspecified chronic kidney disease: Secondary | ICD-10-CM | POA: Diagnosis not present

## 2019-11-22 DIAGNOSIS — I4819 Other persistent atrial fibrillation: Secondary | ICD-10-CM | POA: Diagnosis not present

## 2019-11-22 DIAGNOSIS — E11649 Type 2 diabetes mellitus with hypoglycemia without coma: Secondary | ICD-10-CM | POA: Diagnosis not present

## 2019-11-22 DIAGNOSIS — I5043 Acute on chronic combined systolic (congestive) and diastolic (congestive) heart failure: Secondary | ICD-10-CM | POA: Diagnosis not present

## 2019-11-25 ENCOUNTER — Telehealth: Payer: Self-pay

## 2019-11-25 ENCOUNTER — Other Ambulatory Visit: Payer: Self-pay

## 2019-11-25 ENCOUNTER — Ambulatory Visit (INDEPENDENT_AMBULATORY_CARE_PROVIDER_SITE_OTHER): Payer: Medicare Other | Admitting: *Deleted

## 2019-11-25 ENCOUNTER — Telehealth: Payer: Self-pay | Admitting: Cardiovascular Disease

## 2019-11-25 ENCOUNTER — Emergency Department (HOSPITAL_COMMUNITY): Payer: Medicare Other

## 2019-11-25 ENCOUNTER — Encounter (HOSPITAL_COMMUNITY): Payer: Self-pay | Admitting: Pediatrics

## 2019-11-25 ENCOUNTER — Inpatient Hospital Stay (HOSPITAL_COMMUNITY)
Admission: EM | Admit: 2019-11-25 | Discharge: 2019-12-10 | DRG: 291 | Disposition: E | Payer: Medicare Other | Attending: Cardiology | Admitting: Cardiology

## 2019-11-25 DIAGNOSIS — L299 Pruritus, unspecified: Secondary | ICD-10-CM | POA: Diagnosis present

## 2019-11-25 DIAGNOSIS — I427 Cardiomyopathy due to drug and external agent: Secondary | ICD-10-CM

## 2019-11-25 DIAGNOSIS — E1122 Type 2 diabetes mellitus with diabetic chronic kidney disease: Secondary | ICD-10-CM | POA: Diagnosis present

## 2019-11-25 DIAGNOSIS — T829XXA Unspecified complication of cardiac and vascular prosthetic device, implant and graft, initial encounter: Secondary | ICD-10-CM

## 2019-11-25 DIAGNOSIS — Z6838 Body mass index (BMI) 38.0-38.9, adult: Secondary | ICD-10-CM

## 2019-11-25 DIAGNOSIS — T451X5A Adverse effect of antineoplastic and immunosuppressive drugs, initial encounter: Secondary | ICD-10-CM

## 2019-11-25 DIAGNOSIS — I4891 Unspecified atrial fibrillation: Secondary | ICD-10-CM | POA: Diagnosis not present

## 2019-11-25 DIAGNOSIS — Z95810 Presence of automatic (implantable) cardiac defibrillator: Secondary | ICD-10-CM

## 2019-11-25 DIAGNOSIS — Z515 Encounter for palliative care: Secondary | ICD-10-CM | POA: Diagnosis not present

## 2019-11-25 DIAGNOSIS — Z20822 Contact with and (suspected) exposure to covid-19: Secondary | ICD-10-CM | POA: Diagnosis present

## 2019-11-25 DIAGNOSIS — Z7901 Long term (current) use of anticoagulants: Secondary | ICD-10-CM

## 2019-11-25 DIAGNOSIS — I472 Ventricular tachycardia, unspecified: Secondary | ICD-10-CM

## 2019-11-25 DIAGNOSIS — E785 Hyperlipidemia, unspecified: Secondary | ICD-10-CM | POA: Diagnosis present

## 2019-11-25 DIAGNOSIS — J810 Acute pulmonary edema: Secondary | ICD-10-CM | POA: Diagnosis not present

## 2019-11-25 DIAGNOSIS — Z79899 Other long term (current) drug therapy: Secondary | ICD-10-CM

## 2019-11-25 DIAGNOSIS — N1832 Chronic kidney disease, stage 3b: Secondary | ICD-10-CM | POA: Diagnosis not present

## 2019-11-25 DIAGNOSIS — Z9641 Presence of insulin pump (external) (internal): Secondary | ICD-10-CM | POA: Diagnosis present

## 2019-11-25 DIAGNOSIS — Z794 Long term (current) use of insulin: Secondary | ICD-10-CM

## 2019-11-25 DIAGNOSIS — I480 Paroxysmal atrial fibrillation: Secondary | ICD-10-CM | POA: Diagnosis not present

## 2019-11-25 DIAGNOSIS — I130 Hypertensive heart and chronic kidney disease with heart failure and stage 1 through stage 4 chronic kidney disease, or unspecified chronic kidney disease: Principal | ICD-10-CM | POA: Diagnosis present

## 2019-11-25 DIAGNOSIS — N170 Acute kidney failure with tubular necrosis: Secondary | ICD-10-CM | POA: Diagnosis not present

## 2019-11-25 DIAGNOSIS — I5043 Acute on chronic combined systolic (congestive) and diastolic (congestive) heart failure: Secondary | ICD-10-CM | POA: Diagnosis not present

## 2019-11-25 DIAGNOSIS — I484 Atypical atrial flutter: Secondary | ICD-10-CM | POA: Diagnosis not present

## 2019-11-25 DIAGNOSIS — R570 Cardiogenic shock: Secondary | ICD-10-CM | POA: Diagnosis not present

## 2019-11-25 DIAGNOSIS — N179 Acute kidney failure, unspecified: Secondary | ICD-10-CM | POA: Diagnosis not present

## 2019-11-25 DIAGNOSIS — I428 Other cardiomyopathies: Secondary | ICD-10-CM

## 2019-11-25 DIAGNOSIS — I4819 Other persistent atrial fibrillation: Secondary | ICD-10-CM | POA: Diagnosis present

## 2019-11-25 DIAGNOSIS — I5042 Chronic combined systolic (congestive) and diastolic (congestive) heart failure: Secondary | ICD-10-CM | POA: Diagnosis not present

## 2019-11-25 DIAGNOSIS — Z950 Presence of cardiac pacemaker: Secondary | ICD-10-CM | POA: Diagnosis present

## 2019-11-25 DIAGNOSIS — N184 Chronic kidney disease, stage 4 (severe): Secondary | ICD-10-CM | POA: Diagnosis not present

## 2019-11-25 DIAGNOSIS — E8770 Fluid overload, unspecified: Secondary | ICD-10-CM

## 2019-11-25 DIAGNOSIS — E871 Hypo-osmolality and hyponatremia: Secondary | ICD-10-CM | POA: Diagnosis present

## 2019-11-25 DIAGNOSIS — I4892 Unspecified atrial flutter: Secondary | ICD-10-CM

## 2019-11-25 DIAGNOSIS — Z66 Do not resuscitate: Secondary | ICD-10-CM | POA: Diagnosis present

## 2019-11-25 DIAGNOSIS — Z789 Other specified health status: Secondary | ICD-10-CM

## 2019-11-25 DIAGNOSIS — R34 Anuria and oliguria: Secondary | ICD-10-CM | POA: Diagnosis present

## 2019-11-25 DIAGNOSIS — Z9012 Acquired absence of left breast and nipple: Secondary | ICD-10-CM | POA: Diagnosis not present

## 2019-11-25 DIAGNOSIS — E669 Obesity, unspecified: Secondary | ICD-10-CM | POA: Diagnosis present

## 2019-11-25 DIAGNOSIS — I739 Peripheral vascular disease, unspecified: Secondary | ICD-10-CM | POA: Diagnosis not present

## 2019-11-25 DIAGNOSIS — I5023 Acute on chronic systolic (congestive) heart failure: Secondary | ICD-10-CM | POA: Diagnosis not present

## 2019-11-25 DIAGNOSIS — K767 Hepatorenal syndrome: Secondary | ICD-10-CM | POA: Diagnosis not present

## 2019-11-25 DIAGNOSIS — Z853 Personal history of malignant neoplasm of breast: Secondary | ICD-10-CM

## 2019-11-25 DIAGNOSIS — Z4682 Encounter for fitting and adjustment of non-vascular catheter: Secondary | ICD-10-CM | POA: Diagnosis not present

## 2019-11-25 DIAGNOSIS — E1129 Type 2 diabetes mellitus with other diabetic kidney complication: Secondary | ICD-10-CM | POA: Diagnosis not present

## 2019-11-25 DIAGNOSIS — N189 Chronic kidney disease, unspecified: Secondary | ICD-10-CM | POA: Diagnosis not present

## 2019-11-25 DIAGNOSIS — I110 Hypertensive heart disease with heart failure: Secondary | ICD-10-CM | POA: Diagnosis not present

## 2019-11-25 DIAGNOSIS — Z992 Dependence on renal dialysis: Secondary | ICD-10-CM | POA: Diagnosis not present

## 2019-11-25 DIAGNOSIS — E875 Hyperkalemia: Secondary | ICD-10-CM

## 2019-11-25 DIAGNOSIS — E876 Hypokalemia: Secondary | ICD-10-CM | POA: Diagnosis not present

## 2019-11-25 DIAGNOSIS — R0602 Shortness of breath: Secondary | ICD-10-CM | POA: Diagnosis not present

## 2019-11-25 DIAGNOSIS — I2510 Atherosclerotic heart disease of native coronary artery without angina pectoris: Secondary | ICD-10-CM | POA: Diagnosis present

## 2019-11-25 DIAGNOSIS — N171 Acute kidney failure with acute cortical necrosis: Secondary | ICD-10-CM | POA: Diagnosis not present

## 2019-11-25 DIAGNOSIS — R079 Chest pain, unspecified: Secondary | ICD-10-CM

## 2019-11-25 DIAGNOSIS — I132 Hypertensive heart and chronic kidney disease with heart failure and with stage 5 chronic kidney disease, or end stage renal disease: Secondary | ICD-10-CM | POA: Diagnosis not present

## 2019-11-25 DIAGNOSIS — Z87891 Personal history of nicotine dependence: Secondary | ICD-10-CM

## 2019-11-25 DIAGNOSIS — I5033 Acute on chronic diastolic (congestive) heart failure: Secondary | ICD-10-CM | POA: Diagnosis not present

## 2019-11-25 DIAGNOSIS — I340 Nonrheumatic mitral (valve) insufficiency: Secondary | ICD-10-CM | POA: Diagnosis not present

## 2019-11-25 DIAGNOSIS — J90 Pleural effusion, not elsewhere classified: Secondary | ICD-10-CM | POA: Diagnosis not present

## 2019-11-25 DIAGNOSIS — T82897A Other specified complication of cardiac prosthetic devices, implants and grafts, initial encounter: Secondary | ICD-10-CM | POA: Diagnosis not present

## 2019-11-25 LAB — CUP PACEART REMOTE DEVICE CHECK
Battery Remaining Longevity: 32 mo
Battery Remaining Percentage: 67 %
Battery Voltage: 2.93 V
Brady Statistic AP VP Percent: 71 %
Brady Statistic AP VS Percent: 1 %
Brady Statistic AS VP Percent: 29 %
Brady Statistic AS VS Percent: 1 %
Brady Statistic RA Percent Paced: 21 %
Date Time Interrogation Session: 20210517020027
HighPow Impedance: 68 Ohm
HighPow Impedance: 68 Ohm
Implantable Lead Implant Date: 20130603
Implantable Lead Implant Date: 20130603
Implantable Lead Implant Date: 20130603
Implantable Lead Location: 753858
Implantable Lead Location: 753859
Implantable Lead Location: 753860
Implantable Pulse Generator Implant Date: 20190816
Lead Channel Impedance Value: 340 Ohm
Lead Channel Impedance Value: 530 Ohm
Lead Channel Impedance Value: 530 Ohm
Lead Channel Pacing Threshold Amplitude: 0.625 V
Lead Channel Pacing Threshold Amplitude: 1 V
Lead Channel Pacing Threshold Amplitude: 2.5 V
Lead Channel Pacing Threshold Pulse Width: 0.5 ms
Lead Channel Pacing Threshold Pulse Width: 0.5 ms
Lead Channel Pacing Threshold Pulse Width: 1 ms
Lead Channel Sensing Intrinsic Amplitude: 0.5 mV
Lead Channel Sensing Intrinsic Amplitude: 11.7 mV
Lead Channel Setting Pacing Amplitude: 2 V
Lead Channel Setting Pacing Amplitude: 3.75 V
Lead Channel Setting Pacing Amplitude: 5 V
Lead Channel Setting Pacing Pulse Width: 0.5 ms
Lead Channel Setting Pacing Pulse Width: 1 ms
Lead Channel Setting Sensing Sensitivity: 0.5 mV
Pulse Gen Serial Number: 9842973

## 2019-11-25 LAB — CBC
HCT: 41 % (ref 36.0–46.0)
Hemoglobin: 12.9 g/dL (ref 12.0–15.0)
MCH: 27.4 pg (ref 26.0–34.0)
MCHC: 31.5 g/dL (ref 30.0–36.0)
MCV: 87 fL (ref 80.0–100.0)
Platelets: 291 10*3/uL (ref 150–400)
RBC: 4.71 MIL/uL (ref 3.87–5.11)
RDW: 17.7 % — ABNORMAL HIGH (ref 11.5–15.5)
WBC: 6.7 10*3/uL (ref 4.0–10.5)
nRBC: 0.3 % — ABNORMAL HIGH (ref 0.0–0.2)

## 2019-11-25 LAB — BASIC METABOLIC PANEL
Anion gap: 18 — ABNORMAL HIGH (ref 5–15)
BUN: 71 mg/dL — ABNORMAL HIGH (ref 8–23)
CO2: 21 mmol/L — ABNORMAL LOW (ref 22–32)
Calcium: 9.7 mg/dL (ref 8.9–10.3)
Chloride: 93 mmol/L — ABNORMAL LOW (ref 98–111)
Creatinine, Ser: 2.74 mg/dL — ABNORMAL HIGH (ref 0.44–1.00)
GFR calc Af Amer: 20 mL/min — ABNORMAL LOW (ref >60)
GFR calc non Af Amer: 17 mL/min — ABNORMAL LOW (ref >60)
Glucose, Bld: 153 mg/dL — ABNORMAL HIGH (ref 70–99)
Potassium: 5.3 mmol/L — ABNORMAL HIGH (ref 3.5–5.1)
Sodium: 132 mmol/L — ABNORMAL LOW (ref 135–145)

## 2019-11-25 LAB — HEPATIC FUNCTION PANEL
ALT: 36 U/L (ref 0–44)
AST: 21 U/L (ref 15–41)
Albumin: 3.7 g/dL (ref 3.5–5.0)
Alkaline Phosphatase: 74 U/L (ref 38–126)
Bilirubin, Direct: 1.6 mg/dL — ABNORMAL HIGH (ref 0.0–0.2)
Indirect Bilirubin: 1.5 mg/dL — ABNORMAL HIGH (ref 0.3–0.9)
Total Bilirubin: 3.1 mg/dL — ABNORMAL HIGH (ref 0.3–1.2)
Total Protein: 5.7 g/dL — ABNORMAL LOW (ref 6.5–8.1)

## 2019-11-25 LAB — MAGNESIUM: Magnesium: 1.8 mg/dL (ref 1.7–2.4)

## 2019-11-25 LAB — TROPONIN I (HIGH SENSITIVITY)
Troponin I (High Sensitivity): 43 ng/L — ABNORMAL HIGH (ref <18)
Troponin I (High Sensitivity): 43 ng/L — ABNORMAL HIGH (ref <18)

## 2019-11-25 LAB — SARS CORONAVIRUS 2 BY RT PCR (HOSPITAL ORDER, PERFORMED IN ~~LOC~~ HOSPITAL LAB): SARS Coronavirus 2: NEGATIVE

## 2019-11-25 LAB — GLUCOSE, CAPILLARY: Glucose-Capillary: 122 mg/dL — ABNORMAL HIGH (ref 70–99)

## 2019-11-25 LAB — CBG MONITORING, ED: Glucose-Capillary: 107 mg/dL — ABNORMAL HIGH (ref 70–99)

## 2019-11-25 LAB — BRAIN NATRIURETIC PEPTIDE: B Natriuretic Peptide: 1081.7 pg/mL — ABNORMAL HIGH (ref 0.0–100.0)

## 2019-11-25 MED ORDER — SODIUM CHLORIDE 0.9% FLUSH: 3.0000 mL | Freq: Once | INTRAVENOUS | Status: DC

## 2019-11-25 MED ORDER — AMIODARONE HCL 200 MG PO TABS: 200.0000 mg | ORAL_TABLET | Freq: Every day | ORAL | Status: DC

## 2019-11-25 MED ORDER — ALPRAZOLAM 0.25 MG PO TABS
0.2500 mg | ORAL_TABLET | Freq: Two times a day (BID) | ORAL | Status: DC | PRN
  Administered 2019-11-26 – 2019-11-30 (×4): 0.25 mg via ORAL
  Filled 2019-11-25 (×4): qty 1

## 2019-11-25 MED ORDER — APIXABAN 5 MG PO TABS
5.0000 mg | ORAL_TABLET | Freq: Two times a day (BID) | ORAL | Status: DC
  Administered 2019-11-26 – 2019-11-30 (×10): 5 mg via ORAL
  Filled 2019-11-25 (×11): qty 1

## 2019-11-25 MED ORDER — FUROSEMIDE 10 MG/ML IJ SOLN
40.0000 mg | Freq: Four times a day (QID) | INTRAMUSCULAR | Status: DC
  Administered 2019-11-25 – 2019-11-26 (×2): 40 mg via INTRAVENOUS
  Filled 2019-11-25 (×3): qty 4

## 2019-11-25 MED ORDER — CARVEDILOL 12.5 MG PO TABS
12.5000 mg | ORAL_TABLET | Freq: Two times a day (BID) | ORAL | Status: DC
  Administered 2019-11-26 (×2): 12.5 mg via ORAL
  Filled 2019-11-25 (×3): qty 1

## 2019-11-25 MED ORDER — ACETAMINOPHEN 325 MG PO TABS: 650.0000 mg | ORAL_TABLET | ORAL | Status: DC | PRN

## 2019-11-25 MED ORDER — SODIUM CHLORIDE 0.9% FLUSH: 3.0000 mL | INTRAVENOUS | Status: DC | PRN

## 2019-11-25 MED ORDER — ONDANSETRON HCL 4 MG/2ML IJ SOLN
4.0000 mg | Freq: Four times a day (QID) | INTRAMUSCULAR | Status: DC | PRN
  Administered 2019-11-27 – 2019-12-01 (×3): 4 mg via INTRAVENOUS
  Filled 2019-11-25 (×3): qty 2

## 2019-11-25 MED ORDER — SODIUM CHLORIDE 0.9 % IV SOLN: 250.0000 mL | INTRAVENOUS | Status: DC | PRN

## 2019-11-25 MED ORDER — INSULIN GLARGINE 100 UNIT/ML ~~LOC~~ SOLN
34.0000 [IU] | Freq: Every day | SUBCUTANEOUS | Status: DC
  Filled 2019-11-25 (×4): qty 0.34

## 2019-11-25 MED ORDER — SODIUM CHLORIDE 0.9% FLUSH
3.0000 mL | Freq: Two times a day (BID) | INTRAVENOUS | Status: DC
  Administered 2019-11-25 – 2019-11-30 (×8): 3 mL via INTRAVENOUS

## 2019-11-25 MED ORDER — AMIODARONE HCL 200 MG PO TABS
200.0000 mg | ORAL_TABLET | Freq: Every day | ORAL | Status: DC
  Administered 2019-11-26 – 2019-11-28 (×3): 200 mg via ORAL
  Filled 2019-11-25 (×4): qty 1

## 2019-11-25 MED ORDER — ZOLPIDEM TARTRATE 5 MG PO TABS
5.0000 mg | ORAL_TABLET | Freq: Every evening | ORAL | Status: DC | PRN
  Filled 2019-11-25: qty 1

## 2019-11-25 NOTE — ED Triage Notes (Signed)
Pt reported has been feeling unwell the few weeks along w/ shorthness of bed.

## 2019-11-25 NOTE — ED Provider Notes (Signed)
[place] EMERGENCY DEPARTMENT Provider Note   CSN: [id number] Arrival date & time: 12/05/2019  1251     History Chief Complaint  Patient presents with  . Chest Pain    Sara Walker is a 69 y.o. female.  The history is provided by the patient.  Chest Pain Pain location:  Substernal area Pain quality: aching   Pain radiates to:  Does not radiate Pain severity:  Mild Onset quality:  Gradual Timing:  Intermittent Progression:  Waxing and waning Chronicity:  Recurrent Context: at rest   Relieved by:  Nothing Worsened by:  Nothing Associated symptoms: lower extremity edema   Associated symptoms: no abdominal pain, no back pain, no cough, no fever, no palpitations, no shortness of breath and no vomiting   Risk factors: diabetes mellitus, high cholesterol and hypertension   Risk factors comment:  Afib on blood thinners      Past Medical History:  Diagnosis Date  . Angina   . Anxiety   . Arthritis   . Atrial fibrillation ([place])   . Automatic implantable cardioverter-defibrillator in situ   . Breast cancer ([facility])   . Cardiomyopathy secondary to chemotherapy [facility])    a.  doxurubicin;  b. s/p SJM Quadra Assura CRT-D, model [id number] Q, Ser# [id number]  . CHF (congestive heart failure) ([place])   . Chronic kidney disease    Stage 3  . Chronic systolic heart failure ([place])   . Coronary artery disease   . Depression   . DM2 (diabetes mellitus, type 2) ([place])   . Goiter   . Gout   . Hyperlipidemia   . Hypertension   . Invasive ductal carcinoma of breast ([place])   . LBBB (left bundle branch block)    evidnece of RBBB fall 2012  . 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 PAF (paroxysmal atrial fibrillation) ([place])   . PONV (postoperative nausea and vomiting)    nausea post ICD placement relieved with Zofran  . Shortness of breath   . Syncope     Patient Active Problem List   Diagnosis Date Noted  . Persistent atrial fibrillation ([place])    . Severe mitral insufficiency 09/22/2019  . Severe tricuspid regurgitation 09/22/2019  . Pacemaker   . Atrial fibrillation with RVR ([place]) 09/17/2019  . Hypoglycemia due to insulin 09/17/2019  . Malignant tumor of breast ([place]) 02/14/2019  . Mixed hyperlipidemia 06/20/2018  . ICD (implantable cardioverter-defibrillator) battery depletion 02/23/2018  . 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 Torsades de pointes ([place]) 01/25/2018  . Acute bronchitis 07/28/2015  . Class 2 severe obesity with body mass index (BMI) of 35 to 39.9 with serious comorbidity ([place]) 12/19/2014  . VT (ventricular tachycardia) ([place]) 10/17/2014  . Paroxysmal atrial fibrillation ([place]) 06/10/2014  . Recurrent vomiting 08/29/2013  . AKI (acute kidney injury) ([place]) 08/28/2013  . Nausea & vomiting 08/28/2013  . Dehydration 08/28/2013  . Hypokalemia 08/28/2013  . Hyponatremia 08/28/2013  . Leukocytosis 08/28/2013  . Acute on chronic systolic congestive heart failure ([place]) 08/20/2013  . Muscle herniation 06/19/2012  . Nonischemic cardiomyopathy ([place]) 12/13/2011  . Biventricular ICD (implantable cardioverter-defibrillator) in place 12/12/2011  . Cardiomyopathy secondary to chemotherapy ([place])   . Acute on chronic combined systolic and diastolic CHF (congestive heart failure) ([place])   . Syncope   . Doxorubicin adverse reaction 10/11/2011  . LBBB (left bundle branch block) 10/11/2011  . Goiter   . Diabetes mellitus type 2 in obese ([place])   .  Invasive ductal carcinoma of breast ([place])   . Gout   . Hypercholesterolemia   . Essential hypertension     Past Surgical History:  Procedure Laterality Date  . BI-VENTRICULAR IMPLANTABLE CARDIOVERTER DEFIBRILLATOR N/A 12/12/2011   Procedure: BI-VENTRICULAR IMPLANTABLE CARDIOVERTER DEFIBRILLATOR  (CRT-D);  Surgeon: Deboraha Sprang, MD;  Location: [facility] CATH LAB;  Service: Cardiovascular;  Laterality: N/A;  . BIV ICD GENERATOR CHANGEOUT N/A 02/23/2018    Procedure: BIV ICD GENERATOR CHANGEOUT;  Surgeon: Sanda Klein, MD;  Location: [facility] CV LAB;  Service: Cardiovascular;  Laterality: N/A;  . BREAST SURGERY     Left mastectomy  . BUBBLE STUDY  09/20/2019   Procedure: BUBBLE STUDY;  Surgeon: Sueanne Margarita, MD;  Location: [facility];  Service: Cardiovascular;;  . CARDIAC DEFIBRILLATOR PLACEMENT     [place]  . [place]  . DILATION AND CURETTAGE OF UTERUS  1975  . FOOT SURGERY  1994  . HYSTEROSCOPY WITH D & C N/A 04/04/2019   Procedure: DILATATION AND CURETTAGE /HYSTEROSCOPY;  Surgeon: Everlene Farrier, MD;  Location: [place];  Service: Gynecology;  Laterality: N/A;  . INTRAUTERINE DEVICE (IUD) INSERTION N/A 04/04/2019   Procedure: INTRAUTERINE DEVICE (IUD) INSERTION;  Surgeon: Everlene Farrier, MD;  Location: [place] [facility];  Service: Gynecology;  Laterality: N/A;  Dr. Gaetano Net to bring IUD from office  . MASTECTOMY     right  . NM MYOCAR PERF WALL MOTION  12/01/2011   no ischemia; EF 22%  . [place]  . TEE WITHOUT CARDIOVERSION N/A 09/20/2019   Procedure: TRANSESOPHAGEAL ECHOCARDIOGRAM (TEE);  Surgeon: Sueanne Margarita, MD;  Location[facility] ENDOSCOPY;  Service: Cardiovascular;  Laterality: N/A;  . TUNNELED VENOUS CATHETER PLACEMENT     removed     OB History   No obstetric history on file.     Family History  Problem Relation Age of Onset  . Heart attack Father   . Hypertension Brother   . Diabetes Brother     Social History   Tobacco Use  . Smoking status: Former Research scientist (life sciences)  . Smokeless tobacco: Never Used  Substance Use Topics  . Alcohol use: No    Alcohol/week: 0.0 standard drinks  . Drug use: No    Home Medications Prior to Admission medications   Medication Sig Start Date End Date Taking? Authorizing Provider  allopurinol (ZYLOPRIM) 300 MG tablet Take 450 mg by mouth daily.     [provider]  apixaban (ELIQUIS) 5 MG TABS tablet Take 1 tablet (5 mg total) by mouth 2 (two)  times daily. 11/19/19   Croitoru, Mihai, MD  carvedilol (COREG) 25 MG tablet Take 1 tablet (25 mg total) by mouth 2 (two) times daily. 10/10/18   Croitoru, Mihai, MD  Cholecalciferol (VITAMIN D) 2000 units CAPS Take 4,000 Units by mouth daily.     [provider]  colchicine 0.6 MG tablet Take 0.6 mg by mouth daily as needed (for gout).     [provider]  Insulin Human (INSULIN PUMP) SOLN Inject into the skin continuous. Novolin R    [provider]  Lutein 20 MG CAPS Take 20 mg by mouth daily.     [provider]  NOVOLIN R 100 UNIT/ML injection USE AS DIRECTED MAX DAILY DOSE 200 UNITS VIA INSULIN PUMP FOR 30 DAYS 10/29/19   [provider]  nystatin cream (MYCOSTATIN) Apply 1 application topically 2 (two) times daily. 10/20/19   [provider]  [place]  200 MG tablet Take 1 tablet by mouth once daily 11/15/19   Croitoru, Mihai, MD  potassium chloride SA (KLOR-CON) 20 MEQ tablet Take 2 tablets (40 mEq total) by mouth 2 (two) times daily. 09/26/19   Florencia Reasons, MD  torsemide [facility]) 100 MG tablet Take 1 tablet by mouth once daily 11/11/19   Almyra Deforest, [place]    Allergies    Lasix [furosemide], Ace inhibitors, Codeine, Digitalis, Levaquin [levofloxacin in d5w], Other, Statins, Xarelto [rivaroxaban], and Zyrtec [cetirizine]  Review of Systems   Review of Systems  Constitutional: Negative for chills and fever.  HENT: Negative for ear pain and sore throat.   Eyes: Negative for pain and visual disturbance.  Respiratory: Negative for cough and shortness of breath.   Cardiovascular: Positive for chest pain. Negative for palpitations.  Gastrointestinal: Negative for abdominal pain and vomiting.  Genitourinary: Negative for dysuria and hematuria.  Musculoskeletal: Negative for arthralgias and back pain.  Skin: Negative for color change and rash.  Neurological: Negative for seizures and syncope.  All other systems reviewed and are negative.   Physical  Exam Updated Vital Signs  ED Triage Vitals  Enc Vitals Group     BP 11/27/2019 1302 (!) 85/61     Pulse Rate 11/27/2019 1302 87     Resp 11/26/2019 1302 (!) 24     Temp 11/12/2019 1302 98.1 F (36.7 C)     Temp Source 11/16/2019 1302 Oral     SpO2 11/16/2019 1300 100 %     Weight 11/30/2019 1301 252 lb (114.3 kg)     Height 12/04/2019 1301 5\' 8"  (1.727 m)     Head Circumference --      Peak Flow --      Pain Score 11/23/2019 1301 7     Pain Loc --      Pain Edu? --      Excl. in [place]? --     Physical Exam Vitals and nursing note reviewed.  Constitutional:      General: She is not in acute distress.    Appearance: She is well-developed. She is obese. She is not ill-appearing.  HENT:     Head: Normocephalic and atraumatic.  Eyes:     Conjunctiva/sclera: Conjunctivae normal.     Pupils: Pupils are equal, round, and reactive to light.  Cardiovascular:     Rate and Rhythm: Normal rate and regular rhythm.     Pulses:          Carotid pulses are 2+ on the right side and 2+ on the left side.      Radial pulses are 2+ on the right side and 2+ on the left side.     Heart sounds: Normal heart sounds. No murmur.  Pulmonary:     Effort: Pulmonary effort is normal. No respiratory distress.     Breath sounds: Rhonchi present. No decreased breath sounds or wheezing.  Abdominal:     Palpations: Abdomen is soft.     Tenderness: There is no abdominal tenderness.  Musculoskeletal:        General: Normal range of motion.     Cervical back: Normal range of motion and neck supple.     Right lower leg: Edema (2+) present.     Left lower leg: Edema (2+) present.  Skin:    General: Skin is warm and dry.     Capillary Refill: Capillary refill takes less than 2 seconds.  Neurological:     General: No focal deficit present.  Mental Status: She is alert.  Psychiatric:        Mood and Affect: Mood normal.     ED Results / Procedures / Treatments   Labs (all labs ordered are listed, but only abnormal  results are displayed) Labs Reviewed  BASIC METABOLIC PANEL - Abnormal; Notable for the following components:      Result Value   Sodium 132 (*)    Potassium 5.3 (*)    Chloride 93 (*)    CO2 21 (*)    Glucose, Bld 153 (*)    BUN 71 (*)    Creatinine, Ser 2.74 (*)    GFR calc non Af Amer 17 (*)    GFR calc Af Amer 20 (*)    Anion gap 18 (*)    All other components within normal limits  CBC - Abnormal; Notable for the following components:   RDW 17.7 (*)    nRBC 0.3 (*)    All other components within normal limits  BRAIN NATRIURETIC PEPTIDE - Abnormal; Notable for the following components:   B Natriuretic Peptide 1,081.7 (*)    All other components within normal limits  HEPATIC FUNCTION PANEL - Abnormal; Notable for the following components:   Total Protein 5.7 (*)    Total Bilirubin 3.1 (*)    Bilirubin, Direct 1.6 (*)    Indirect Bilirubin 1.5 (*)    All other components within normal limits  TROPONIN I (HIGH SENSITIVITY) - Abnormal; Notable for the following components:   Troponin I (High Sensitivity) 43 (*)    All other components within normal limits  SARS CORONAVIRUS 2 BY RT PCR [facility] ORDER, [place] LAB)  TROPONIN I (HIGH SENSITIVITY)    EKG EKG Interpretation  Date/Time:  Monday Nov 25 2019 12:55:25 EDT Ventricular Rate:  86 PR Interval:    QRS Duration: 150 QT Interval:  484 QTC Calculation: 579 R Axis:   -99 Text Interpretation: Undetermined rhythm Non-specific intra-ventricular conduction block Possible Lateral infarct , age undetermined Abnormal ECG Confirmed by Lennice Sites [phone]) on 12/05/2019 1:07:37 PM   Radiology DG Chest Port 1 View  Result Date: 11/13/2019 CLINICAL DATA:  CP ,SOB. HX of pacemaker. EXAM: PORTABLE CHEST - 1 VIEW COMPARISON:  09/30/2019 FINDINGS: Left retrocardiac atelectasis/consolidation obscuring the left diaphragmatic leaflet. Right lung clear. Leftward deviation of the trachea secondary to right  paratracheal mass probably goiter as previously described. Stable cardiomegaly. Stable left subclavian AICD. Can not exclude small left effusion. No pneumothorax. Surgical clips right axilla. IMPRESSION: Persistent left retrocardiac atelectasis/consolidation. Electronically Signed   By: Lucrezia Europe M.D.   On: 11/21/2019 13:40   CUP PACEART REMOTE DEVICE CHECK  Result Date: 11/10/2019 Scheduled remote reviewed. Normal device function.  1653 AMS (69%). AFL ongoing; persistent since early April. Takes Eliquis. Bi-v pacing 78%. RV CapConfirm in high output mode; threshold reported as "unknown." Routing to triage. Next remote 91 days. Felisa Bonier, RN, MSN   Procedures Procedures (including critical care time)  Medications Ordered in ED Medications  sodium chloride flush (NS) 0.9 % injection 3 mL (has no administration in time range)    ED Course  I have reviewed the triage vital signs and the nursing notes.  Pertinent labs & imaging results that were available during my care of the patient were reviewed by me and considered in my medical decision making (see chart for details).    MDM Rules/Calculators/A&P  Sara Walker is a 69 year old female with history of heart failure, cardiomyopathy status post ICD, CKD who presents to the ED with shortness of breath, chest pain.  Patient initially with hypotension upon arrival but resolved upon my evaluation.  Patient was sent after having ICD report this morning showing some complications.  She states that cardiology is aware of this.  She has been having shortness of breath and heart failure symptoms and weight gain over the last several days.  She has been struggling with her diuretics and different medications to help with this.  Patient appears volume overloaded on exam with peripheral edema.  She has rhonchi.  EKG overall shows paced rhythm.  No new ischemic changes.  Chest x-ray with no major infectious process or signs of volume  overload.  Troponin mildly elevated at 43.  BNP is 1000.  However patient with creatinine of 2.74 which is up from her baseline.  Potassium is mildly elevated at 5.3.  No significant leukocytosis or anemia otherwise.  Given her ICD issues and need for evaluation of her settings of her ICD and signs of heart failure I have talked with cardiology for admission.  They will come back to the ED to evaluate the patient to determine further work-up.  This chart was dictated using voice recognition software.  Despite best efforts to proofread,  errors can occur which can change the documentation meaning.     final Clinical Impression(s) / ED Diagnoses Final diagnoses:  AKI (acute kidney injury) ([place])  Hypervolemia, unspecified hypervolemia type  Problem related to implantable cardioverter-defibrillator (ICD)  Atrial fibrillation, unspecified type ([place])  Acute on chronic combined systolic (congestive) and diastolic (congestive) heart failure [facility])    Rx / DC Orders ED Discharge Orders    None       Lennice Sites, DO 11/20/2019 1527

## 2019-11-25 NOTE — Telephone Encounter (Signed)
The patient was currently pulling into [facility] ED when I spoke with her. She was having chest pains and shortness of breath.

## 2019-11-25 NOTE — Telephone Encounter (Signed)
° ° °  Pt is calling to let Dr. Sallyanne Kuster know she is on her way to the [facility] ED. She said Sara Walker called her and she's on Afib and she's feeling a little chest pain. See notes from Trena Platt  Please advise

## 2019-11-25 NOTE — Telephone Encounter (Signed)
Call placed to the patient. She was currently on the way to the ED with complaints of chest pain and shortness of breath.

## 2019-11-25 NOTE — Telephone Encounter (Signed)
Scheduled Remote transmission received- Afl ongoing, resistent since early April.  Pt is BiV pacing 78% of time.  RV CapConfirm in high output mode; threshold reported as "unknown." Called patient to plan for clinic visit to test Rv lead.    Upon answering pt stated she felt horrible.  She is having difficulty sleeping, increased SOB, 4.5lbs weight gain over the weekend.  Advised pt of current transmission showing Afl since mid April.  She stated she thought she was back in rhythm but has noticed racing heart rate recently.    Per patient current meds include  100mg  Amiodarone daily 5mg  Eliquis BID 25mg  Spironolactone Daily 100mg  Torsemide Daily 25mg  Carvedilol BID  Recommended to pt due to increased SOB and weight gain, she really needs to be evaluated in ED.  She states every doctor she has seen has told her that but she doesn't want to go.  Stressed to pt the importance of treating conditions she has.  Pt stated that she will see if a family member can take her later today.

## 2019-11-25 NOTE — H&P (Addendum)
Cardiology Admission History and Physical:   Patient ID: Sara Walker MRN: [id number]; DOB: [birth year]   Admission date: 11/21/2019  Primary Care Provider: Deland Pretty, MD Primary Cardiologist: Sanda Klein, MD  Primary Electrophysiologist:  None   Chief Complaint:  Shortness of breath  Patient Profile:   Sara Walker is a 69 y.o. female with pmh of chronic combined CHF, NICM with EF 25-30% s/p CRT-D, PAF on Eliquis, ICD shocks for polymorphic VT in 2016 in the setting of hypokalemia, LBBB, HTN, HLD, DM2 on insulin pump, CKD sage IV, h/o of uterine bleeding with IUD, anxiety and depression who is being seen for shortness of breath and aflutter.   History of Present Illness:   [name] is followed by Dr. Sallyanne Kuster. Nonischemic cardiomyopathy felt to be secondary to chemotherapy. She has been intolerant to many medications, including ACEi, ARB, Entresto and statin. Despite CRT-D therapy EF has remained low. The office was alerted of atrial tachycardia from 3/5-3/7 and the patient was admitted for afib RVR and acute heart failure. Echo showed EF 20-30% with global hypokinesis and septal lateral dyssynergy consistent with loss of resynchronization pacing. She was treated with IV lasix and kidney function worsened and nephrology was consulted who recommended Torsemide and metolazone. She had TEE 09/20/19 for DCCV but was unable to perform due to early forming thrombus in the left atrial appendage. Eliquis was started and she was loaded with amiodarone therapy. Discharge weight was 250lbs. She was discharged with torsemide 50 mg daily. She was seen 11/08/19 and had reported weight gain that resolved with metolazone and torsemide. She saw nephrology May 7th and torsemide was doubled to 100 mg daily and spiro 12.5mg  daily started. This initially this improved weight status but after the second week she noted a 5 lb weight gain. Scheduled PPM transmission today showed Aflutter that had been ongoing  since early April, BiV pacing 78% of the time. Patient was contacted who reported she had generally feeling horrible and had a 4.5lbs weight gain and difficulty sleeping due to frequent urination and sob. ED eval recommended ED evaluation.   The patient came to the ED 11/09/2019 for generally feeling poorly, sob, and chest pain. Says sob on exertion has been chronic, since her admission in March, and feels this never improved (despite being in sinus on the last PPM transmission). Also reported intermittent chest tightness that she feels is directly related to volume overload. She says she has intermittent diarrhea. No fever or chills. Also has orthopnea at night and abdominal swelling that is worse at night. She feels her appetite is down due to the severe abdominal swelling.   In the ED BP 85/61, pulse 87, RR 24, afebrile, 100% O2, weight 252 lbs. Edema noted on exam. EKG showed Afib with BiV paced rhythm at 86 bpm. Labs show potassium 5.3, creatinine 2.74. WBC 6.7, Hgb 12.9, BNP 1,081. Hs trop 43>43. CXR showed persistent left retrocardiac atelectasis/consolidation. COVID pending   Past Medical History:  Diagnosis Date  . Angina   . Anxiety   . Arthritis   . Atrial fibrillation ([place])   . Automatic implantable cardioverter-defibrillator in situ   . Breast cancer ([place])   . Cardiomyopathy secondary to chemotherapy [facility])    a.  doxurubicin;  b. s/p SJM Quadra Assura CRT-D, model [id number] Q, Ser# [id number]  . CHF (congestive heart failure) ([place])   . Chronic kidney disease    Stage 3  . Chronic systolic heart failure ([place])   . Coronary artery disease   .  Depression   . DM2 (diabetes mellitus, type 2) ([place])   . Goiter   . Gout   . Hyperlipidemia   . Hypertension   . Invasive ductal carcinoma of breast ([place])   . LBBB (left bundle branch block)    evidnece of RBBB fall 2012  . 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 PAF (paroxysmal atrial fibrillation) ([place])   . PONV  (postoperative nausea and vomiting)    nausea post ICD placement relieved with Zofran  . Shortness of breath   . Syncope     Past Surgical History:  Procedure Laterality Date  . BI-VENTRICULAR IMPLANTABLE CARDIOVERTER DEFIBRILLATOR N/A 12/12/2011   Procedure: BI-VENTRICULAR IMPLANTABLE CARDIOVERTER DEFIBRILLATOR  (CRT-D);  Surgeon: Deboraha Sprang, MD;  Location: [facility] CATH LAB;  Service: Cardiovascular;  Laterality: N/A;  . BIV ICD GENERATOR CHANGEOUT N/A 02/23/2018   Procedure: BIV ICD GENERATOR CHANGEOUT;  Surgeon: Sanda Klein, MD;  Location: [place] CV LAB;  Service: Cardiovascular;  Laterality: N/A;  . BREAST SURGERY     Left mastectomy  . BUBBLE STUDY  09/20/2019   Procedure: BUBBLE STUDY;  Surgeon: Sueanne Margarita, MD;  Location: [place];  Service: Cardiovascular;;  . CARDIAC DEFIBRILLATOR PLACEMENT     [place]  . [place]  . DILATION AND CURETTAGE OF UTERUS  1975  . FOOT SURGERY  1994  . HYSTEROSCOPY WITH D & C N/A 04/04/2019   Procedure: DILATATION AND CURETTAGE /HYSTEROSCOPY;  Surgeon: Everlene Farrier, MD;  Location: [place];  Service: Gynecology;  Laterality: N/A;  . INTRAUTERINE DEVICE (IUD) INSERTION N/A 04/04/2019   Procedure: INTRAUTERINE DEVICE (IUD) INSERTION;  Surgeon: Everlene Farrier, MD;  Location: [place];  Service: Gynecology;  Laterality: N/A;  Dr. Gaetano Net to bring IUD from office  . MASTECTOMY     right  . NM MYOCAR PERF WALL MOTION  12/01/2011   no ischemia; EF 22%  . [place]  . TEE WITHOUT CARDIOVERSION N/A 09/20/2019   Procedure: TRANSESOPHAGEAL ECHOCARDIOGRAM (TEE);  Surgeon: Sueanne Margarita, MD;  Location: [facility] ENDOSCOPY;  Service: Cardiovascular;  Laterality: N/A;  . TUNNELED VENOUS CATHETER PLACEMENT     removed     Medications Prior to Admission: Prior to Admission medications   Medication Sig Start Date End Date Taking? Authorizing Provider  allopurinol (ZYLOPRIM) 300 MG tablet Take 450 mg by mouth daily.      [provider]  apixaban (ELIQUIS) 5 MG TABS tablet Take 1 tablet (5 mg total) by mouth 2 (two) times daily. 11/19/19   Croitoru, Mihai, MD  carvedilol (COREG) 25 MG tablet Take 1 tablet (25 mg total) by mouth 2 (two) times daily. 10/10/18   Croitoru, Mihai, MD  Cholecalciferol (VITAMIN D) 2000 units CAPS Take 4,000 Units by mouth daily.     [provider]  colchicine 0.6 MG tablet Take 0.6 mg by mouth daily as needed (for gout).     [provider]  Insulin Human (INSULIN PUMP) SOLN Inject into the skin continuous. Novolin R    [provider]  Lutein 20 MG CAPS Take 20 mg by mouth daily.     [provider]  NOVOLIN R 100 UNIT/ML injection USE AS DIRECTED MAX DAILY DOSE 200 UNITS VIA INSULIN PUMP FOR 30 DAYS 10/29/19   [provider]  nystatin cream (MYCOSTATIN) Apply 1 application topically 2 (two) times daily. 10/20/19   [provider]  PACERONE 200 MG tablet Take 1  tablet by mouth once daily 11/15/19   Croitoru, Mihai, MD  potassium chloride SA (KLOR-CON) 20 MEQ tablet Take 2 tablets (40 mEq total) by mouth 2 (two) times daily. 09/26/19   Florencia Reasons, MD  torsemide [facility]) 100 MG tablet Take 1 tablet by mouth once daily 11/11/19   Almyra Deforest, [place]     Allergies:    Allergies  Allergen Reactions  . Lasix [Furosemide] Other (See Comments)    Causes gout  . Ace Inhibitors Cough  . Codeine Other (See Comments)    Crazy  . Digitalis Other (See Comments)    "saw yellow circles"  . Levaquin [Levofloxacin In D5w] Other (See Comments)    Gout   . Other Other (See Comments)    Nitro spray: reaction unknown  . Statins Other (See Comments)    Reaction unknown  . Xarelto [Rivaroxaban]   . Zyrtec [Cetirizine] Other (See Comments)    Slept for a day then was up for 4 days afterwards    Social History:   Social History   Socioeconomic History  . Marital status: Divorced    Spouse name: Not on file  . Number of children: Not on file   . Years of education: Not on file  . Highest education level: Not on file  Occupational History  . Not on file  Tobacco Use  . Smoking status: Former Research scientist (life sciences)  . Smokeless tobacco: Never Used  Substance and Sexual Activity  . Alcohol use: No    Alcohol/week: 0.0 standard drinks  . Drug use: No  . Sexual activity: Not Currently  Other Topics Concern  . Not on file  Social History Narrative  . Not on file   Social Determinants of Health   Financial Resource Strain: Low Risk   . Difficulty of Paying Living Expenses: Not hard at all  Food Insecurity: No Food Insecurity  . Worried About Charity fundraiser in the Last Year: Never true  . Ran Out of Food in the Last Year: Never true  Transportation Needs: No Transportation Needs  . Lack of Transportation (Medical): No  . Lack of Transportation (Non-Medical): No  Physical Activity: Inactive  . Days of Exercise per Week: 0 days  . Minutes of Exercise per Session: 0 min  Stress: Stress Concern Present  . Feeling of Stress : Very much  Social Connections: Slightly Isolated  . Frequency of Communication with Friends and Family: More than three times a week  . Frequency of Social Gatherings with Friends and Family: More than three times a week  . Attends Religious Services: 1 to 4 times per year  . Active Member of Clubs or Organizations: Yes  . Attends Archivist Meetings: 1 to 4 times per year  . Marital Status: Divorced  Human resources officer Violence: Not At Risk  . Fear of Current or Ex-Partner: No  . Emotionally Abused: No  . Physically Abused: No  . Sexually Abused: No    Family History:   The patient's family history includes Diabetes in her brother; Heart attack in her father; Hypertension in her brother.    ROS:  Please see the history of present illness.  All other ROS reviewed and negative.     Physical Exam/Data:   Vitals:   12/09/2019 1300 12/06/2019 1301 11/18/2019 1302 11/26/2019 1500  BP:   (!) 85/61 102/72    Pulse:   87 97  Resp:   (!) 24 (!) 22  Temp:   98.1 F (36.7  C)   TempSrc:   Oral   SpO2: 100%  100% 97%  Weight:  114.3 kg    Height:  5\' 8"  (1.727 m)     No intake or output data in the 24 hours ending 12/08/2019 1510 Last 3 Weights 11/15/2019 11/08/2019 10/07/2019  Weight (lbs) 252 lb 253 lb 6.4 oz 257 lb  Weight (kg) 114.306 kg 114.941 kg 116.574 kg     Body mass index is 38.32 kg/m.  General:  Well nourished, well developed, in no acute distress HEENT: normal Lymph: no adenopathy Neck: no JVD Endocrine:  No thryomegaly Vascular: No carotid bruits; FA pulses 2+ bilaterally without bruits  Cardiac:  normal S1, S2; Irreg Irreg; no murmur  Lungs:  Minimal crackles at bases Abd: soft, nontender, no hepatomegaly  Ext: 1-2+ edema Musculoskeletal:  No deformities, BUE and BLE strength normal and equal Skin: warm and dry  Neuro:  CNs 2-12 intact, no focal abnormalities noted Psych:  Normal affect    EKG:  The ECG that was done 12/09/2019 was personally reviewed and demonstrates Aflutter/fib   Relevant CV Studies:  Echo TEE 09/20/19 1. Left ventricular ejection fraction, by estimation, is 25 to 30%. The  left ventricle has severely decreased function. The left ventricle  demonstrates global hypokinesis. The left ventricular internal cavity size  was severely dilated.  2. Right ventricular systolic function is mildly reduced. The right  ventricular size is mildly enlarged.  3. There was a loosely formed thrombus in the body of the LA appendage.  Left atrial size was severely dilated.  4. Right atrial size was mildly dilated.  5. Tricuspid valve regurgitation is moderate to severe.  6. The mitral valve is normal in structure. Moderate to severe mitral  valve regurgitation likley related to MV leaflet teathering and annular  dilatation. THe MR wraps around the LA but does not appear to enter the  pulmonary vein by colorflow doppler but  there is intermittent pulmonary vein  flow reversal with PW doppler.  7. The aortic valve is tricuspid. Aortic valve regurgitation is not  visualized. Mild aortic valve sclerosis is present, with no evidence of  aortic valve stenosis.  8. Evidence of atrial level shunting detected by color flow Doppler.  Agitated saline contrast bubble study was positive with shunting observed  within 3-6 cardiac cycles suggestive of interatrial shunt. There is a  small patent foramen ovale with  bidirectional shunting across atrial septum.  9. Mild to moderate aortic atherosclerosis is present in the ascending  and descending aorta. aorta.   Echo limited 09/19/19 1. Left ventricular ejection fraction, by estimation, is 25 to 30%. The  left ventricle has severely decreased function. The left ventricle  demonstrates global hypokinesis. The left ventricular internal cavity size  was severely dilated. There is mild  concentric left ventricular hypertrophy. Left ventricular diastolic  parameters are indeterminate.  2. Mild mitral valve regurgitation.  3. The aortic valve is tricuspid.  4. There is moderately elevated pulmonary artery systolic pressure.  5. The inferior vena cava is dilated in size with <50% respiratory  variability, suggesting right atrial pressure of 15 mmHg.   PPM check 11/29/2019 Scheduled remote reviewed. Normal device function.   1653 AMS (69%). AFL ongoing; persistent since early April. Takes Eliquis.  Bi-v pacing 78%.  RV CapConfirm in high output mode; threshold reported as "unknown." Routing to triage.  Next remote 91 days.  Felisa Bonier, RN, MSN  Laboratory Data:  High Sensitivity Troponin:   Recent Labs  Lab 11/23/2019 1331  TROPONINIHS 43*      Chemistry Recent Labs  Lab 11/28/2019 1331  NA 132*  K 5.3*  CL 93*  CO2 21*  GLUCOSE 153*  BUN 71*  CREATININE 2.74*  CALCIUM 9.7  GFRNONAA 17*  GFRAA 20*  ANIONGAP 18*    Recent Labs  Lab 12/05/2019 1331  PROT 5.7*  ALBUMIN 3.7  AST 21  ALT 36    ALKPHOS 74  BILITOT 3.1*   Hematology Recent Labs  Lab 11/30/2019 1331  WBC 6.7  RBC 4.71  HGB 12.9  HCT 41.0  MCV 87.0  MCH 27.4  MCHC 31.5  RDW 17.7*  PLT 291   BNP Recent Labs  Lab 11/12/2019 1331  BNP 1,081.7*    DDimer No results for input(s): DDIMER in the last 168 hours.   Radiology/Studies:  DG Chest Port 1 View  Result Date: 11/09/2019 CLINICAL DATA:  CP ,SOB. HX of pacemaker. EXAM: PORTABLE CHEST - 1 VIEW COMPARISON:  09/30/2019 FINDINGS: Left retrocardiac atelectasis/consolidation obscuring the left diaphragmatic leaflet. Right lung clear. Leftward deviation of the trachea secondary to right paratracheal mass probably goiter as previously described. Stable cardiomegaly. Stable left subclavian AICD. Can not exclude small left effusion. No pneumothorax. Surgical clips right axilla. IMPRESSION: Persistent left retrocardiac atelectasis/consolidation. Electronically Signed   By: Lucrezia Europe M.D.   On: 12/06/2019 13:40   CUP PACEART REMOTE DEVICE CHECK  Result Date: 11/09/2019 Scheduled remote reviewed. Normal device function.  1653 AMS (69%). AFL ongoing; persistent since early April. Takes Eliquis. Bi-v pacing 78%. RV CapConfirm in high output mode; threshold reported as "unknown." Routing to triage. Next remote 91 days. Felisa Bonier, RN, MSN  {  Assessment and Plan:   Paroxysmal Afib/flutter - Presents with worsening sob, orthopnea, and generally feeling poorly suspect this is secondary to ongoing aflutter/fib - she had admission in March 2021 with afib and underwent TEE that showed early forming thrombus in the LA and was started on Eliquis. She was gong to undergo DCCV but spontaneously converted - PPM transmission showed persistent aflutter - she takes amiodarone, and decreased it to 100mg  BID - eliquis for stroke ppx - EKG with afib and BiV pacing - K+>4 and Mag>2 - TSH 3.5 in March 2021 - consider repeat TEE/DCCV. Npo for possible procedure tomorrow - continue  amiodarone 200 mg BID - continue BB - MD to see  Acute on Chronic combined heart failure - BNP up to 1,000  - Patient takes Torsemide 100 mg daily at baseline with metolazone as needed. Also reports she is on spironolactone 12.5mg  daily but do not see this on her med list - creatinine up to 2.74, follows with nephrology as outpatient - weight is 252lbs which right around dry weight of 250lbs - LLE on exam - I think patient will benefit from some IV diuresis  - hold Potassium with K+ of 5.3 - daily weights and strict I/Os  Nonischemic cardiomyopathy s/p CRT-D - recent interrogation as above - follows with EP  AKI/CKD stage IV - creatinine on admission 2.74 - baseline around 2.00 - follows with nephrology as outpatient - daily BMET - If creatinine worsens might need to consult nephrology  HTN - coreg 25mg  BID and torsemide 100 mg daily - pressures are soft>>will decrease coreg   Severity of Illness: The appropriate patient status for this patient is INPATIENT. Inpatient status is judged to be reasonable and necessary in order to provide the required intensity of service to ensure the  patient's safety. The patient's presenting symptoms, physical exam findings, and initial radiographic and laboratory data in the context of their chronic comorbidities is felt to place them at high risk for further clinical deterioration. Furthermore, it is not anticipated that the patient will be medically stable for discharge from the hospital within 2 midnights of admission. The following factors support the patient status of inpatient.   " The patient's presenting symptoms include shortness of breath. " The worrisome physical exam findings include LLE. " The initial radiographic and laboratory data are worrisome because of Afib/flutter. " The chronic co-morbidities include HTN, PAF   * I certify that at the point of admission it is my clinical judgment that the patient will require inpatient  hospital care spanning beyond 2 midnights from the point of admission due to high intensity of service, high risk for further deterioration and high frequency of surveillance required.*   For questions or updates, please contact [place] Please consult [email or web address] for contact info under   Signed, Cadence Ninfa Meeker, PA-C  11/09/2019 3:10 PM    The patient was seen, examined and discussed with Cadence Ninfa Meeker, PA-C   and I agree with the above.   69 y.o. female with pmh of chronic combined CHF, NICM with EF 25-30% s/p CRT-D with no LVEF improvement, many meds intolerances including Entresto, PAF on Eliquis, ICD shocks for polymorphic VT in 2016 in the setting of hypokalemia, LBBB, HTN, HLD, IDDM2, CKD sage IV, h/o of uterine bleeding with IUD, anxiety and depression who is being seen for shortness of breath, orthopnea, PND, LE edema, increased abdominal girth. Scheduled PPM transmission today showed Aflutter that had been ongoing since early April, BiV pacing 78% of the time. Overall weight gain 5 lbs, inability to sleep.   Admitted in 09/2019 - ac on chr CHF sec to loss of resynchronization pacing and being in atrial flutter with RVR. She was treated with IV lasix and kidney function worsened and nephrology was consulted who recommended Torsemide and metolazone. She had TEE 09/20/19 for DCCV but was unable to perform due to early forming thrombus in the left atrial appendage. Eliquis was started and she was loaded with amiodarone therapy. Discharge weight was 250lbs. Nephrology increased torsemide to 100 mg daily and spiro 12.5mg  daily started on 11/15/2019.  On physical exam she seems slightly short of breath in upright position, her lungs have minimal crackles, there is no significant murmur and regular rhythm, her lower extremity seems to have 2+ edema bilaterally.  In the ED BP 85/61, pulse 87, RR 24, afebrile, 100% O2, weight 252 lbs. Edema noted on exam. EKG showed Afib with BiV paced rhythm  at 86 bpm. Labs show potassium 5.3, creatinine 2.74. WBC 6.7, Hgb 12.9, BNP 1,081. Hs trop 43>43. CXR showed persistent left retrocardiac atelectasis/consolidation. COVID pending  Assessment and plan  Acute on chronic combined systolic diastolic CHF Chronic persistent atrial fibrillation/atrial flutter with RVR Nonischemic cardiomyopathy, LVEF 25 to 30% Status post CRT-D Acute on chronic kidney failure, CKD stage IV  We will start Lasix 40 mg IV every 6 hours as she appears fluid overloaded.  Ultimate therapy will be cardioversion if she does not have thrombus in her left atrial appendage anymore.  We will schedule TEE with cardioversion, earliest possible date Wednesday, Nov 27, 2019.  She has not missed any doses of Eliquis.  We will follow her creatinine and electrolytes closely. We will involve EP as she has been on amiodarone to see what will  be long-term goal for her atrial fibrillation.  Ena Dawley, MD 11/17/2019

## 2019-11-26 ENCOUNTER — Other Ambulatory Visit: Payer: Self-pay | Admitting: *Deleted

## 2019-11-26 DIAGNOSIS — Z992 Dependence on renal dialysis: Secondary | ICD-10-CM

## 2019-11-26 DIAGNOSIS — N189 Chronic kidney disease, unspecified: Secondary | ICD-10-CM

## 2019-11-26 DIAGNOSIS — I5033 Acute on chronic diastolic (congestive) heart failure: Secondary | ICD-10-CM

## 2019-11-26 LAB — GLUCOSE, CAPILLARY
Glucose-Capillary: 86 mg/dL (ref 70–99)
Glucose-Capillary: 125 mg/dL — ABNORMAL HIGH (ref 70–99)
Glucose-Capillary: 139 mg/dL — ABNORMAL HIGH (ref 70–99)
Glucose-Capillary: 129 mg/dL — ABNORMAL HIGH (ref 70–99)
Glucose-Capillary: 126 mg/dL — ABNORMAL HIGH (ref 70–99)

## 2019-11-26 LAB — CBC
HCT: 38.7 % (ref 36.0–46.0)
Hemoglobin: 12.1 g/dL (ref 12.0–15.0)
MCH: 26.6 pg (ref 26.0–34.0)
MCHC: 31.3 g/dL (ref 30.0–36.0)
MCV: 85.1 fL (ref 80.0–100.0)
Platelets: 315 K/uL (ref 150–400)
RBC: 4.55 MIL/uL (ref 3.87–5.11)
RDW: 17.5 % — ABNORMAL HIGH (ref 11.5–15.5)
WBC: 7.1 K/uL (ref 4.0–10.5)
nRBC: 0.3 % — ABNORMAL HIGH (ref 0.0–0.2)

## 2019-11-26 LAB — BASIC METABOLIC PANEL WITH GFR
Anion gap: 15 (ref 5–15)
BUN: 78 mg/dL — ABNORMAL HIGH (ref 8–23)
CO2: 23 mmol/L (ref 22–32)
Calcium: 10.1 mg/dL (ref 8.9–10.3)
Chloride: 94 mmol/L — ABNORMAL LOW (ref 98–111)
Creatinine, Ser: 3.26 mg/dL — ABNORMAL HIGH (ref 0.44–1.00)
GFR calc Af Amer: 16 mL/min — ABNORMAL LOW
GFR calc non Af Amer: 14 mL/min — ABNORMAL LOW
Glucose, Bld: 103 mg/dL — ABNORMAL HIGH (ref 70–99)
Potassium: 5.2 mmol/L — ABNORMAL HIGH (ref 3.5–5.1)
Sodium: 132 mmol/L — ABNORMAL LOW (ref 135–145)

## 2019-11-26 LAB — LIPID PANEL
Cholesterol: 152 mg/dL (ref 0–200)
HDL: 19 mg/dL — ABNORMAL LOW
LDL Cholesterol: 113 mg/dL — ABNORMAL HIGH (ref 0–99)
Total CHOL/HDL Ratio: 8 ratio
Triglycerides: 102 mg/dL
VLDL: 20 mg/dL (ref 0–40)

## 2019-11-26 MED ORDER — ADULT MULTIVITAMIN W/MINERALS CH
1.0000 | ORAL_TABLET | Freq: Every day | ORAL | Status: DC
  Administered 2019-11-26 – 2019-11-30 (×5): 1 via ORAL
  Filled 2019-11-26 (×5): qty 1

## 2019-11-26 MED ORDER — FUROSEMIDE 10 MG/ML IJ SOLN
120.0000 mg | Freq: Three times a day (TID) | INTRAVENOUS | Status: DC
  Administered 2019-11-26 – 2019-11-29 (×9): 120 mg via INTRAVENOUS
  Filled 2019-11-26 (×2): qty 12
  Filled 2019-11-26: qty 2
  Filled 2019-11-26: qty 12
  Filled 2019-11-26: qty 10
  Filled 2019-11-26: qty 2
  Filled 2019-11-26 (×3): qty 10
  Filled 2019-11-26: qty 12
  Filled 2019-11-26: qty 10
  Filled 2019-11-26 (×2): qty 12

## 2019-11-26 NOTE — Progress Notes (Signed)
Remote ICD transmission.   

## 2019-11-26 NOTE — Consult Note (Signed)
Reason for Consult: Renal failure Referring Physician:  Dr. Ena Dawley  Chief Complaint: Shortness of breath  Assessment/Plan: 1. AKI on CKDIIIb - BL cr 1.8-2.1 with acute component secondary to cardiorenal syndrome complicated by the paroxysmal afib/flutter.  - Lasix 120 mg IV q 8hr and will add metolazone if needed. - If she does not respond to IV diuretics then will entertain CRRT to help offload on this additional fluid to hopefully improve cardiac function. I spent a long time with her educating and discussing options. I am concerned if we start RRT to help with fluid management she may not come off RRT and she is not a good candidate for long term dialysis. - She doesn't want to suffer and the past 3 mths according to the pt has not been living.  - Strict I&O's and fluid restriction to 1.2L  2. Afib/flutter - possible TEE/DCCV 3. Combined heart failure with an acute component as well. 4. NICM s/p CRT-D 5. HTN - may need to lower doses with aggressive diuresis.   HPI: Sara Walker is an 69 y.o. female combined CHF NICM (chemotherapy) EF 25-30% s/p difibrillator, PAD on Eliquis, HTN HLD DM CKD4 w/ echo showing global hypokinesis. She was seen by renal in 09/2019 and started on Torsemide and metolazone. Recently she was seen by renal and Torsemide was increased to 100mg  daily and initiation of spironolactone 12.5mg . She p/w not feeling well, dyspnea, CP (tightness) , SOB w/ exertion, orthopnea and anorexia. Her BL cr is ~1.8-2.1 and was seen earlier this month at [facility] with Dr. Moshe Cipro w/ escalation of her torsemide dose to 100mg  daily and addition of aldactone as well. Since this admission her cr  has increased from 2.74 to 3.26. She has received Lasix 40mg  IV with poor response. She has not felt well for awhile for 3 mths and has been in and out of the hospital. Diuretics seems to only help for under a week at which time she ends up gaining even more weight back.  ROS Pertinent  items are noted in HPI.  Chemistry and CBC: Creat  Date/Time Value Ref Range Status  08/20/2013 01:18 PM 1.04 0.50 - 1.10 mg/dL Final   Creatinine, Ser  Date/Time Value Ref Range Status  11/26/2019 04:57 AM 3.26 (H) 0.44 - 1.00 mg/dL Final  11/13/2019 01:31 PM 2.74 (H) 0.44 - 1.00 mg/dL Final  11/08/2019 03:52 PM 1.30 (H) 0.57 - 1.00 mg/dL Final  10/07/2019 11:54 AM 2.00 (H) 0.57 - 1.00 mg/dL Final  09/30/2019 04:17 AM 2.21 (H) 0.44 - 1.00 mg/dL Final  09/25/2019 04:45 AM 1.81 (H) 0.44 - 1.00 mg/dL Final  09/24/2019 02:42 AM 2.12 (H) 0.44 - 1.00 mg/dL Final  09/23/2019 03:29 AM 1.88 (H) 0.44 - 1.00 mg/dL Final  09/22/2019 04:34 AM 2.01 (H) 0.44 - 1.00 mg/dL Final  09/21/2019 02:59 AM 2.40 (H) 0.44 - 1.00 mg/dL Final  09/20/2019 12:04 AM 2.63 (H) 0.44 - 1.00 mg/dL Final  09/19/2019 04:41 AM 2.35 (H) 0.44 - 1.00 mg/dL Final  09/18/2019 05:16 AM 2.11 (H) 0.44 - 1.00 mg/dL Final  09/17/2019 03:59 AM 1.88 (H) 0.44 - 1.00 mg/dL Final  09/16/2019 11:49 PM 1.82 (H) 0.44 - 1.00 mg/dL Final  04/02/2019 09:45 AM 0.92 0.44 - 1.00 mg/dL Final  02/06/2018 08:21 AM 0.89 0.57 - 1.00 mg/dL Final  10/13/2014 10:07 AM 1.07 (H) 0.57 - 1.00 mg/dL Final  08/19/2014 11:13 AM 0.96 0.57 - 1.00 mg/dL Final  08/15/2014 12:02 PM 1.23 (H) 0.57 - 1.00  mg/dL Final  09/02/2013 10:17 AM 0.88 0.57 - 1.00 mg/dL Final  08/29/2013 07:20 AM 1.00 0.50 - 1.10 mg/dL Final    Comment:    RESULT REPEATED AND VERIFIED  08/28/2013 12:30 PM 1.53 (H) 0.50 - 1.10 mg/dL Final  08/23/2013 10:28 AM 1.22 (H) 0.57 - 1.00 mg/dL Final  12/12/2011 06:31 AM 0.85 0.50 - 1.10 mg/dL Final  10/14/2011 06:10 AM 0.93 0.50 - 1.10 mg/dL Final  10/13/2011 09:06 AM 0.92 0.50 - 1.10 mg/dL Final  10/12/2011 05:34 AM 0.85 0.50 - 1.10 mg/dL Final  10/11/2011 04:55 PM 0.97 0.50 - 1.10 mg/dL Final  11/28/2006 10:14 AM 1.00 0.40 - 1.20 mg/dL Final   Recent Labs  Lab 11/22/2019 1331 11/26/19 0457  NA 132* 132*  K 5.3* 5.2*  CL 93* 94*  CO2 21*  23  GLUCOSE 153* 103*  BUN 71* 78*  CREATININE 2.74* 3.26*  CALCIUM 9.7 10.1   Recent Labs  Lab 12/04/2019 1331 11/26/19 0457  WBC 6.7 7.1  HGB 12.9 12.1  HCT 41.0 38.7  MCV 87.0 85.1  PLT 291 315   Liver Function Tests: Recent Labs  Lab 11/27/2019 1331  AST 21  ALT 36  ALKPHOS 74  BILITOT 3.1*  PROT 5.7*  ALBUMIN 3.7   No results for input(s): LIPASE, AMYLASE in the last 168 hours. No results for input(s): AMMONIA in the last 168 hours. Cardiac Enzymes: No results for input(s): CKTOTAL, CKMB, CKMBINDEX, TROPONINI in the last 168 hours. Iron Studies: No results for input(s): IRON, TIBC, TRANSFERRIN, FERRITIN in the last 72 hours. PT/INR: @LABRCNTIP (inr:5)  Xrays/Other Studies: ) Results for orders placed or performed during the hospital encounter of 11/19/2019 (from the past 48 hour(s))  Basic metabolic panel     Status: Abnormal   Collection Time: 11/16/2019  1:31 PM  Result Value Ref Range   Sodium 132 (L) 135 - 145 mmol/L   Potassium 5.3 (H) 3.5 - 5.1 mmol/L   Chloride 93 (L) 98 - 111 mmol/L   CO2 21 (L) 22 - 32 mmol/L   Glucose, Bld 153 (H) 70 - 99 mg/dL    Comment: Glucose reference range applies only to samples taken after fasting for at least 8 hours.   BUN 71 (H) 8 - 23 mg/dL   Creatinine, Ser 2.74 (H) 0.44 - 1.00 mg/dL   Calcium 9.7 8.9 - 10.3 mg/dL   GFR calc non Af Amer 17 (L) >60 mL/min   GFR calc Af Amer 20 (L) >60 mL/min   Anion gap 18 (H) 5 - 15    Comment: Performed at [facility] [address], [place], [place] [address]  CBC     Status: Abnormal   Collection Time: 11/30/2019  1:31 PM  Result Value Ref Range   WBC 6.7 4.0 - 10.5 K/uL   RBC 4.71 3.87 - 5.11 MIL/uL   Hemoglobin 12.9 12.0 - 15.0 g/dL   HCT 41.0 36.0 - 46.0 %   MCV 87.0 80.0 - 100.0 fL   MCH 27.4 26.0 - 34.0 pg   MCHC 31.5 30.0 - 36.0 g/dL   RDW 17.7 (H) 11.5 - 15.5 %   Platelets 291 150 - 400 K/uL   nRBC 0.3 (H) 0.0 - 0.2 %    Comment: Performed at [facility] [address], [place], [place] [address]  Troponin I (High Sensitivity)     Status: Abnormal   Collection Time: 11/24/2019  1:31 PM  Result Value Ref Range  Troponin I (High Sensitivity) 43 (H) <18 ng/L    Comment: (NOTE) Elevated high sensitivity troponin I (hsTnI) values and significant  changes across serial measurements may suggest ACS but many other  chronic and acute conditions are known to elevate hsTnI results.  Refer to the "Links" section for chest pain algorithms and additional  guidance. Performed at [facility], [place] [address] [place], [place] [address]   Brain natriuretic peptide     Status: Abnormal   Collection Time: 11/11/2019  1:31 PM  Result Value Ref Range   B Natriuretic Peptide 1,081.7 (H) 0.0 - 100.0 pg/mL    Comment: Performed at [facility] [address], [place], [place] [address]  Hepatic function panel     Status: Abnormal   Collection Time: 11/14/2019  1:31 PM  Result Value Ref Range   Total Protein 5.7 (L) 6.5 - 8.1 g/dL   Albumin 3.7 3.5 - 5.0 g/dL   AST 21 15 - 41 U/L   ALT 36 0 - 44 U/L   Alkaline Phosphatase 74 38 - 126 U/L   Total Bilirubin 3.1 (H) 0.3 - 1.2 mg/dL   Bilirubin, Direct 1.6 (H) 0.0 - 0.2 mg/dL   Indirect Bilirubin 1.5 (H) 0.3 - 0.9 mg/dL    Comment: Performed at [facility] [address], [place], [place] [address]  Troponin I (High Sensitivity)     Status: Abnormal   Collection Time: 11/22/2019  3:17 PM  Result Value Ref Range   Troponin I (High Sensitivity) 43 (H) <18 ng/L    Comment: (NOTE) Elevated high sensitivity troponin I (hsTnI) values and significant  changes across serial measurements may suggest ACS but many other  chronic and acute conditions are known to elevate hsTnI results.  Refer to the "Links" section for chest pain algorithms and additional  guidance. Performed at [facility], [place] [address], [place], [place] [address]   Magnesium     Status: None   Collection Time: 12/06/2019   3:17 PM  Result Value Ref Range   Magnesium 1.8 1.7 - 2.4 mg/dL    Comment: Performed at [facility] [address] [place], [place] [address]  SARS Coronavirus 2 by RT PCR (hospital order, performed in [facility] hospital lab) Nasopharyngeal Nasopharyngeal Swab     Status: None   Collection Time: 11/29/2019  4:36 PM   Specimen: Nasopharyngeal Swab  Result Value Ref Range   SARS Coronavirus 2 NEGATIVE NEGATIVE    Comment: (NOTE) SARS-CoV-2 target nucleic acids are NOT DETECTED. The SARS-CoV-2 RNA is generally detectable in upper and lower respiratory specimens during the acute phase of infection. The lowest concentration of SARS-CoV-2 viral copies this assay can detect is 250 copies / mL. A negative result does not preclude SARS-CoV-2 infection and should not be used as the sole basis for treatment or other patient management decisions.  A negative result may occur with improper specimen collection / handling, submission of specimen other than nasopharyngeal swab, presence of viral mutation(s) within the areas targeted by this assay, and inadequate number of viral copies (<250 copies / mL). A negative result must be combined with clinical observations, patient history, and epidemiological information. Fact Sheet for Patients:   StrictlyIdeas.no Fact Sheet for Healthcare Providers: BankingDealers.co.za This test is not yet approved or cleared  by the [place] FDA and has been authorized for detection and/or diagnosis of SARS-CoV-2 by FDA under an Emergency Use Authorization (EUA).  This EUA will remain in effect (meaning this test can be used) for the duration of the COVID-19 declaration under Section 564(b)(1) of the Act, 21 U.S.C. section 360bbb-3(b)(1), unless the authorization is terminated or revoked sooner. Performed at [facility], [place] [address] [place], [place] [address]   CBG monitoring, ED     Status:  Abnormal   Collection Time: 11/17/2019  8:53 PM  Result Value Ref Range   Glucose-Capillary 107 (H) 70 - 99 mg/dL    Comment: Glucose reference range applies only to samples taken after fasting for at least 8 hours.  Glucose, capillary     Status: Abnormal   Collection Time: 11/09/2019 10:27 PM  Result Value Ref Range   Glucose-Capillary 122 (H) 70 - 99 mg/dL    Comment: Glucose reference range applies only to samples taken after fasting for at least 8 hours.  Glucose, capillary     Status: None   Collection Time: 11/26/19  3:30 AM  Result Value Ref Range   Glucose-Capillary 86 70 - 99 mg/dL    Comment: Glucose reference range applies only to samples taken after fasting for at least 8 hours.  Basic metabolic panel     Status: Abnormal   Collection Time: 11/26/19  4:57 AM  Result Value Ref Range   Sodium 132 (L) 135 - 145 mmol/L   Potassium 5.2 (H) 3.5 - 5.1 mmol/L   Chloride 94 (L) 98 - 111 mmol/L   CO2 23 22 - 32 mmol/L   Glucose, Bld 103 (H) 70 - 99 mg/dL    Comment: Glucose reference range applies only to samples taken after fasting for at least 8 hours.   BUN 78 (H) 8 - 23 mg/dL   Creatinine, Ser 3.26 (H) 0.44 - 1.00 mg/dL   Calcium 10.1 8.9 - 10.3 mg/dL   GFR calc non Af Amer 14 (L) >60 mL/min   GFR calc Af Amer 16 (L) >60 mL/min   Anion gap 15 5 - 15    Comment: Performed at [facility] [address] [place], [place] [address]  Lipid panel     Status: Abnormal   Collection Time: 11/26/19  4:57 AM  Result Value Ref Range   Cholesterol 152 0 - 200 mg/dL   Triglycerides 102 <150 mg/dL   HDL 19 (L) >40 mg/dL   Total CHOL/HDL Ratio 8.0 RATIO   VLDL 20 0 - 40 mg/dL   LDL Cholesterol 113 (H) 0 - 99 mg/dL    Comment:        Total Cholesterol/HDL:CHD Risk Coronary Heart Disease Risk Table                     Men   Women  1/2 Average Risk   3.4   3.3  Average Risk       5.0   4.4  2 X Average Risk   9.6   7.1  3 X Average Risk  23.4   11.0        Use the calculated  Patient Ratio above and the CHD Risk Table to determine the patient's CHD Risk.        ATP III CLASSIFICATION (LDL):  <100     mg/dL   Optimal  100-129  mg/dL   Near or Above                    Optimal  130-159  mg/dL   Borderline  160-189  mg/dL   High  >190     mg/dL   Very High Performed at [facility] [address], [place], [place] [address]   CBC     Status: Abnormal   Collection Time: 11/26/19  4:57 AM  Result Value Ref Range   WBC 7.1 4.0 - 10.5 K/uL   RBC 4.55 3.87 - 5.11 MIL/uL   Hemoglobin 12.1 12.0 - 15.0 g/dL   HCT 38.7 36.0 - 46.0 %   MCV 85.1 80.0 - 100.0 fL   MCH 26.6 26.0 - 34.0 pg   MCHC 31.3 30.0 - 36.0 g/dL   RDW 17.5 (H) 11.5 - 15.5 %   Platelets 315 150 - 400 K/uL   nRBC 0.3 (H) 0.0 - 0.2 %    Comment: Performed at [facility], [place] [address] [place], [place] [address]  Glucose, capillary     Status: Abnormal   Collection Time: 11/26/19  7:44 AM  Result Value Ref Range   Glucose-Capillary 125 (H) 70 - 99 mg/dL    Comment: Glucose reference range applies only to samples taken after fasting for at least 8 hours.  Glucose, capillary     Status: Abnormal   Collection Time: 11/26/19 11:03 AM  Result Value Ref Range   Glucose-Capillary 139 (H) 70 - 99 mg/dL    Comment: Glucose reference range applies only to samples taken after fasting for at least 8 hours.   DG Chest Port 1 View  Result Date: 11/24/2019 CLINICAL DATA:  CP ,SOB. HX of pacemaker. EXAM: PORTABLE CHEST - 1 VIEW COMPARISON:  09/30/2019 FINDINGS: Left retrocardiac atelectasis/consolidation obscuring the left diaphragmatic leaflet. Right lung clear. Leftward deviation of the trachea secondary to right paratracheal mass probably goiter as previously described. Stable cardiomegaly. Stable left subclavian AICD. Can not exclude small left effusion. No pneumothorax. Surgical clips right axilla. IMPRESSION: Persistent left retrocardiac atelectasis/consolidation. Electronically Signed   By:  Lucrezia Europe M.D.   On: 11/17/2019 13:40   CUP PACEART REMOTE DEVICE CHECK  Result Date: 11/27/2019 Scheduled remote reviewed. Normal device function.  1653 AMS (69%). AFL ongoing; persistent since early April. Takes Eliquis. Bi-v pacing 78%. RV CapConfirm in high output mode; threshold reported as "unknown." Routing to triage. Next remote 91 days. Felisa Bonier, RN, MSN   PMH:   Past Medical History:  Diagnosis Date  . Angina   . Anxiety   . Arthritis   . Atrial fibrillation ([place])   . Automatic implantable cardioverter-defibrillator in situ   . Breast cancer ([facility])   . Cardiomyopathy secondary to chemotherapy [facility])    a.  doxurubicin;  b. s/p SJM Quadra Assura CRT-D, model [id number] Q, Ser# [id number]  . CHF (congestive heart failure) ([facility])   . Chronic kidney disease    Stage 3  . Chronic systolic heart failure ([facility])   . Coronary artery disease   . Depression   . DM2 (diabetes mellitus, type 2) ([place])   . Goiter   . Gout   . Hyperlipidemia   . Hypertension   . Invasive ductal carcinoma of breast ([facility])   . LBBB (left bundle branch block)    evidnece of RBBB fall 2012  . 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 PAF (paroxysmal atrial fibrillation) ([place])   . PONV (postoperative nausea and vomiting)    nausea post ICD placement relieved with Zofran  . Shortness of breath   . Syncope     PSH:   Past Surgical History:  Procedure Laterality Date  . BI-VENTRICULAR IMPLANTABLE CARDIOVERTER DEFIBRILLATOR N/A 12/12/2011   Procedure: BI-VENTRICULAR IMPLANTABLE CARDIOVERTER DEFIBRILLATOR  (CRT-D);  Surgeon: Deboraha Sprang, MD;  Location[facility] C[facility] LAB;  Service: Cardiovascular;  Laterality: N/A;  . BIV ICD GENERATOR CHANGEOUT N/A 02/23/2018   Procedure: BIV ICD GENERATOR CHANGEOUT;  Surgeon: Sanda Klein, MD;  Location: [facility] CV LAB;  Service: Cardiovascular;  Laterality: N/A;  . BREAST SURGERY     Left mastectomy  . BUBBLE STUDY  09/20/2019   Procedure: BUBBLE STUDY;   Surgeon: Sueanne Margarita, MD;  Location: [facility];  Service: Cardiovascular;;  . CARDIAC DEFIBRILLATOR PLACEMENT     [place]  . [place]  . DILATION AND CURETTAGE OF UTERUS  1975  . FOOT SURGERY  1994  . HYSTEROSCOPY WITH D & C N/A 04/04/2019   Procedure: DILATATION AND CURETTAGE /HYSTEROSCOPY;  Surgeon: Everlene Farrier, MD;  Location: [facility];  Service: Gynecology;  Laterality: N/A;  . INTRAUTERINE DEVICE (IUD) INSERTION N/A 04/04/2019   Procedure: INTRAUTERINE DEVICE (IUD) INSERTION;  Surgeon: Everlene Farrier, MD;  Location: [facility];  Service: Gynecology;  Laterality: N/A;  Dr. Gaetano Net to bring IUD from office  . MASTECTOMY     right  . NM MYOCAR PERF WALL MOTION  12/01/2011   no ischemia; EF 22%  . [place]  . TEE WITHOUT CARDIOVERSION N/A 09/20/2019   Procedure: TRANSESOPHAGEAL ECHOCARDIOGRAM (TEE);  Surgeon: Sueanne Margarita, MD;  Location[facility] ENDOSCOPY;  Service: Cardiovascular;  Laterality: N/A;  . TUNNELED VENOUS CATHETER PLACEMENT     removed    Allergies:  Allergies  Allergen Reactions  . Lasix [Furosemide] Other (See Comments)    Causes gout  . Ace Inhibitors Cough  . Codeine Other (See Comments)    Crazy  . Covid-19 (Mrna) Vaccine (Moderna) [Covid-19 (Mrna) Vaccine]     Shortness of breath  . Digitalis Other (See Comments)    "saw yellow circles"  . Latex Other (See Comments)    Break out.  [place] [Levofloxacin In D5w] Other (See Comments)    Gout   . Other Other (See Comments)    Nitro spray: reaction unknown  . Statins Other (See Comments)    Reaction unknown  . Xarelto [Rivaroxaban]   . Zyrtec [Cetirizine] Other (See Comments)    Slept for a day then was up for 4 days afterwards    Medications:   Prior to Admission medications   Medication Sig Start Date End Date Taking? Authorizing Provider  allopurinol (ZYLOPRIM) 300 MG tablet Take 450 mg by mouth daily.    Yes [provider]  apixaban (ELIQUIS) 5  MG TABS tablet Take 1 tablet (5 mg total) by mouth 2 (two) times daily. 11/19/19  Yes Croitoru, Mihai, MD  carvedilol (COREG) 25 MG tablet Take 1 tablet (25 mg total) by mouth 2 (two) times daily. 10/10/18  Yes Croitoru, Mihai, MD  Cholecalciferol (VITAMIN D) 2000 units CAPS Take 4,000 Units by mouth daily.    Yes [provider]  Insulin Degludec (TRESIBA) 100 UNIT/ML SOLN Inject 34 Units into the skin daily.   Yes [provider]  Lutein 20 MG CAPS Take 20 mg by mouth daily.    Yes [provider]  PACERONE 200 MG tablet Take 1 tablet by mouth once daily 11/15/19  Yes Croitoru, Mihai, MD  potassium chloride SA (KLOR-CON) 20 MEQ tablet Take 2 tablets (40 mEq total) by mouth 2 (two) times  daily. Patient taking differently: Take 40 mEq by mouth daily.  09/26/19  Yes Florencia Reasons, MD  spironolactone (ALDACTONE) 25 MG tablet Take 25 mg by mouth daily.   Yes [provider]  torsemide (DEMADEX) 100 MG tablet Take 1 tablet by mouth once daily Patient taking differently: Take 100 mg by mouth daily.  11/11/19  Yes Almyra Deforest, PA    Discontinued Meds:   Medications Discontinued During This Encounter  Medication Reason  . colchicine 0.6 MG tablet Patient Preference  . Insulin Human (INSULIN PUMP) SOLN Patient Preference  . NOVOLIN R 100 UNIT/ML injection Patient Preference  . nystatin cream (MYCOSTATIN) Patient Preference  . amiodarone (PACERONE) tablet 200 mg Reorder    Social History:  reports that she has quit smoking. She has never used smokeless tobacco. She reports that she does not drink alcohol or use drugs.  Family History:   Family History  Problem Relation Age of Onset  . Heart attack Father   . Hypertension Brother   . Diabetes Brother     Blood pressure 91/69, pulse 92, temperature 98.4 F (36.9 C), temperature source Oral, resp. rate 16, height 5\' 8"  (1.727 m), weight 114.3 kg, SpO2 98 %. General appearance: alert, cooperative and appears stated  age Head: Normocephalic, without obvious abnormality, atraumatic Eyes: negative Neck: no adenopathy, no carotid bruit, supple, symmetrical, trachea midline and thyroid not enlarged, symmetric, no tenderness/mass/nodules Back: symmetric, no curvature. ROM normal. No CVA tenderness. Resp: rales bibasilar and bilaterally Cardio: regular rate and rhythm and flutter on monitoring GI: obese, ascites? Extremities: edema 3+ Pulses: 2+ and symmetric Lymph nodes: Cervical adenopathy: none       Adalynn Corne, Hunt Oris, MD 11/26/2019, 12:44 PM

## 2019-11-26 NOTE — Progress Notes (Signed)
Pt.voided only 80cc total urine since last night.Bladder scan done obtained only 81cc.MD on call Roby Lofts made aware .No further orders noted.Will endorse to next shift nurse.

## 2019-11-26 NOTE — Progress Notes (Signed)
Progress Note  Patient Name: Sara Walker Date of Encounter: 11/26/2019  Primary Cardiologist: Sanda Klein, MD   Subjective   The patient continues to feel short of breath, she was not able to sleep at night and just doesn't feel well today.  Inpatient Medications    Scheduled Meds: . amiodarone  200 mg Oral Daily  . apixaban  5 mg Oral BID  . carvedilol  12.5 mg Oral BID  . furosemide  40 mg Intravenous Q6H  . insulin glargine  34 Units Subcutaneous Q2200  . sodium chloride flush  3 mL Intravenous Once  . sodium chloride flush  3 mL Intravenous Q12H   Continuous Infusions: . sodium chloride     PRN Meds: sodium chloride, acetaminophen, ALPRAZolam, ondansetron (ZOFRAN) IV, sodium chloride flush, zolpidem   Vital Signs    Vitals:   11/26/19 0520 11/26/19 0547 11/26/19 0554 11/26/19 0805  BP: (!) 102/50   (!) 118/107  Pulse: 83  85 93  Resp: 18   18  Temp: 97.7 F (36.5 C)   97.9 F (36.6 C)  TempSrc: Oral   Oral  SpO2: 97%  93% 96%  Weight:  114.3 kg 114.3 kg   Height:        Intake/Output Summary (Last 24 hours) at 11/26/2019 1001 Last data filed at 11/26/2019 0500 Gross per 24 hour  Intake 120 ml  Output 80 ml  Net 40 ml   Last 3 Weights 11/26/2019 11/26/2019 11/24/2019  Weight (lbs) 252 lb 252 lb 252 lb 9.6 oz  Weight (kg) 114.306 kg 114.306 kg 114.579 kg      Telemetry    Atrial fibrillation with ventricular rates in 90s- Personally Reviewed  ECG    No new tracing- Personally Reviewed  Physical Exam   GEN: No acute distress.   Neck: No JVD Cardiac: iRRR, no murmurs, rubs, or gallops.  Respiratory: Clear to auscultation bilaterally. GI: Soft, nontender, non-distended  MS: No edema; No deformity. Neuro:  Nonfocal  Psych: Normal affect   Labs    High Sensitivity Troponin:   Recent Labs  Lab 12/08/2019 1331 11/22/2019 1517  TROPONINIHS 43* 43*      Chemistry Recent Labs  Lab 11/10/2019 1331 11/26/19 0457  NA 132* 132*  K 5.3* 5.2*   CL 93* 94*  CO2 21* 23  GLUCOSE 153* 103*  BUN 71* 78*  CREATININE 2.74* 3.26*  CALCIUM 9.7 10.1  PROT 5.7*  --   ALBUMIN 3.7  --   AST 21  --   ALT 36  --   ALKPHOS 74  --   BILITOT 3.1*  --   GFRNONAA 17* 14*  GFRAA 20* 16*  ANIONGAP 18* 15     Hematology Recent Labs  Lab 11/13/2019 1331 11/26/19 0457  WBC 6.7 7.1  RBC 4.71 4.55  HGB 12.9 12.1  HCT 41.0 38.7  MCV 87.0 85.1  MCH 27.4 26.6  MCHC 31.5 31.3  RDW 17.7* 17.5*  PLT 291 315   BNP Recent Labs  Lab 11/15/2019 1331  BNP 1,081.7*    DDimer No results for input(s): DDIMER in the last 168 hours.   Radiology    DG Chest Port 1 View  Result Date: 11/30/2019 CLINICAL DATA:  CP ,SOB. HX of pacemaker. EXAM: PORTABLE CHEST - 1 VIEW COMPARISON:  09/30/2019 FINDINGS: Left retrocardiac atelectasis/consolidation obscuring the left diaphragmatic leaflet. Right lung clear. Leftward deviation of the trachea secondary to right paratracheal mass probably goiter as previously described. Stable  cardiomegaly. Stable left subclavian AICD. Can not exclude small left effusion. No pneumothorax. Surgical clips right axilla. IMPRESSION: Persistent left retrocardiac atelectasis/consolidation. Electronically Signed   By: Lucrezia Europe M.D.   On: 12/03/2019 13:40   CUP PACEART REMOTE DEVICE CHECK  Result Date: 11/11/2019 Scheduled remote reviewed. Normal device function.  1653 AMS (69%). AFL ongoing; persistent since early April. Takes Eliquis. Bi-v pacing 78%. RV CapConfirm in high output mode; threshold reported as "unknown." Routing to triage. Next remote 91 days. Felisa Bonier, RN, MSN  Cardiac Studies    Patient Profile     69 y.o. female with pmh of chronic combined CHF, NICM with EF 25-30% s/p CRT-D with no LVEF improvement, many meds intolerances including Entresto, PAF on Eliquis, ICD shocks for polymorphic VT in 2016 in the setting of hypokalemia, LBBB, HTN, HLD, IDDM2, CKD sage IV, h/o of uterine bleeding with IUD, anxiety and  depression who is being seen for shortness of breath, orthopnea, PND, LE edema, increased abdominal girth. Scheduled PPM transmission today showed Aflutter that had been ongoing since early April, BiV pacing 78% of the time. Overall weight gain 5 lbs, inability to sleep.   Admitted in 09/2019 - ac on chr CHF sec to loss of resynchronization pacing and being in atrial flutter with RVR. She was treated with IV lasix and kidney function worsened and nephrology was consulted who recommended Torsemide and metolazone. She had TEE 09/20/19 for DCCV but was unable to perform due to early forming thrombus in the left atrial appendage. Eliquis was started and she was loaded with amiodarone therapy. Discharge weight was 250lbs. Nephrology increased torsemide to 100 mg daily and spiro 12.5mg  daily started on 11/15/2019.  On physical exam she seems slightly short of breath in upright position, her lungs have minimal crackles, there is no significant murmur and regular rhythm, her lower extremity seems to have 2+ edema bilaterally.  In the ED BP 85/61, pulse 87, RR 24, afebrile, 100% O2, weight 252 lbs. Edema noted on exam. EKG showed Afib with BiV paced rhythm at 86 bpm. Labs show potassium 5.3, creatinine 2.74. WBC 6.7, Hgb 12.9, BNP 1,081. Hs trop 43>43. CXR showed persistent left retrocardiac atelectasis/consolidation.   Assessment & Plan    Acute on chronic combined systolic diastolic CHF Chronic persistent atrial fibrillation/atrial flutter with RVR Nonischemic cardiomyopathy, LVEF 25 to 30% Status post CRT-D Acute on chronic kidney failure, CKD stage IV  Restarted Lasix yesterday with no diuresis and increasing creatinine, I will hold Lasix and call nephrology consult for help. She remains in atrial fibrillation/atrial flutter currently with controlled rates. I believe she will feel significantly better after cardioversion, unfortunately not scheduled till Thursday. She previously underwent TEE with planned  cardioversion however she had a thrombus in her left atrial appendage. She has not missed any doses of Eliquis.  We will follow her creatinine and electrolytes closely. We will involve EP as she has been on amiodarone to see what will be long-term goal for her atrial fibrillation.  For questions or updates, please contact [place] Please consult [email or web address] for contact info under     Signed, Ena Dawley, MD  11/26/2019, 10:01 AM

## 2019-11-26 NOTE — Discharge Instructions (Signed)

## 2019-11-26 NOTE — Progress Notes (Signed)
Initial Nutrition Assessment  DOCUMENTATION CODES:   Obesity unspecified  INTERVENTION:    MVI with minerals daily.  Magic cup TID with meals, each supplement provides 290 kcal and 9 grams of protein.  NUTRITION DIAGNOSIS:   Inadequate oral intake related to decreased appetite as evidenced by per patient/family report.  GOAL:   Patient will meet greater than or equal to 90% of their needs  MONITOR:   PO intake, Supplement acceptance, Labs, I & O's  REASON FOR ASSESSMENT:   Malnutrition Screening Tool    ASSESSMENT:   69 yo female admitted with SOB, orthopnea, LE edem. PMH includes CHF, NICM, HTN, HLD, DM-2, CKD stage IV.   Patient reports that she just feels bad today. She says she has lost 30 lbs of fluid recently. She usually eats well at home, but has been eating poorly since March when she went into a fib. She states "they are still trying to get all this fluid off me." She does not plan to eat anything after lunch today because she feels so full from all the fluid.   From review of usual weights below, patient has not had any significant weight changes.  Wt Readings from Last 5 Encounters:  11/26/19 114.3 kg  11/08/19 114.9 kg  10/07/19 116.6 kg  09/26/19 113.6 kg  07/02/19 113.7 kg    Labs reviewed. Sodium 132 (L), potassium 5.2 (H), BUN 78 (H), Creat 3.26 (H) CBG: [id number]  Medications reviewed and include lasix, lantus.   NUTRITION - FOCUSED PHYSICAL EXAM:    Most Recent Value  Orbital Region  No depletion  Upper Arm Region  No depletion  Thoracic and Lumbar Region  No depletion  Buccal Region  No depletion  Temple Region  No depletion  Clavicle Bone Region  No depletion  Clavicle and Acromion Bone Region  No depletion  Scapular Bone Region  No depletion  Dorsal Hand  No depletion  Patellar Region  No depletion  Anterior Thigh Region  No depletion  Posterior Calf Region  No depletion  Edema (RD Assessment)  Moderate  Hair  Reviewed  Eyes   Reviewed  Mouth  Reviewed  Skin  Reviewed  Nails  Reviewed       Diet Order:   Diet Order            Diet heart healthy/carb modified Room service appropriate? Yes; Fluid consistency: Thin  Diet effective now              EDUCATION NEEDS:   Not appropriate for education at this time  Skin:  Skin Assessment: Reviewed RN Assessment(MASD to abdomen)  Last BM:  5/17 type 5  Height:   Ht Readings from Last 1 Encounters:  12/02/2019 5\' 8"  (1.727 m)    Weight:   Wt Readings from Last 1 Encounters:  11/26/19 114.3 kg    Ideal Body Weight:  63.6 kg  BMI:  Body mass index is 38.32 kg/m.  Estimated Nutritional Needs:   Kcal:  [id number]  Protein:  100-125 gm  Fluid:  1.7 L    Lucas Mallow, RD, LDN, CNSC Please refer to Amion for contact information.

## 2019-11-26 NOTE — Progress Notes (Signed)
   12/02/2019 2207  Assess: MEWS Score  Temp 98.1 F (36.7 C)  BP (!) 84/64  Pulse Rate (!) 102  Resp (!) 22  SpO2 97 %  O2 Device Room Air  Assess: MEWS Score  MEWS Temp 0  MEWS Systolic 1  MEWS Pulse 1  MEWS RR 1  MEWS LOC 0  MEWS Score 3  MEWS Score Color Yellow  Assess: if the MEWS score is Yellow or Red  Were vital signs taken at a resting state? Yes  Focused Assessment Documented focused assessment  Early Detection of Sepsis Score *See Row Information* Low  MEWS guidelines implemented *See Row Information* Yes  Treat  MEWS Interventions Administered scheduled meds/treatments  Take Vital Signs  Increase Vital Sign Frequency  Yellow: Q 2hr X 2 then Q 4hr X 2, if remains yellow, continue Q 4hrs  Escalate  MEWS: Escalate Yellow: discuss with charge nurse/RN and consider discussing with provider and RRT  Notify: Charge Nurse/RN  Name of Charge Nurse/RN Notified Sharee Pimple RN  Date Charge Nurse/RN Notified 12/09/2019  Time Charge Nurse/RN Notified 2207  Notify: Provider  Provider Name/Title Dr.Rose  Date Provider Notified 12/09/2019  Time Provider Notified 2230  Notification Type Page  Notification Reason Other (Comment) (B/P low )  Response Other (Comment) (ok to give Lasix IV)  Date of Provider Response 11/14/2019  Time of Provider Response 1610

## 2019-11-26 NOTE — Patient Outreach (Signed)
[facility]) Care Management  11/26/2019  CAMILA MAITA [birth year] [id number]   Returning pt call. Mrs. Breuer is currently in the hospital at [facility]. She says she called because she needs help from the [facility] billing department.   She reports she came into the hospital because of feeling poorly, SOB, chest tightness, edema. Labs showed her potassium was high, creatinine high, BNP high.  We agreed I would follow her progress.  Will call finance dept and let them know pt needs assistance with payment plan.  Eulah Pont. Myrtie Neither, MSN, [facility] Gerontological Nurse Practitioner [facility] Care Management [phone]

## 2019-11-27 DIAGNOSIS — I132 Hypertensive heart and chronic kidney disease with heart failure and with stage 5 chronic kidney disease, or end stage renal disease: Secondary | ICD-10-CM

## 2019-11-27 DIAGNOSIS — R34 Anuria and oliguria: Secondary | ICD-10-CM

## 2019-11-27 DIAGNOSIS — N171 Acute kidney failure with acute cortical necrosis: Secondary | ICD-10-CM

## 2019-11-27 LAB — CBC
HCT: 38.8 % (ref 36.0–46.0)
Hemoglobin: 12.1 g/dL (ref 12.0–15.0)
MCH: 26.6 pg (ref 26.0–34.0)
MCHC: 31.2 g/dL (ref 30.0–36.0)
MCV: 85.3 fL (ref 80.0–100.0)
Platelets: 270 10*3/uL (ref 150–400)
RBC: 4.55 MIL/uL (ref 3.87–5.11)
RDW: 17.8 % — ABNORMAL HIGH (ref 11.5–15.5)
WBC: 6 10*3/uL (ref 4.0–10.5)
nRBC: 0 % (ref 0.0–0.2)

## 2019-11-27 LAB — GLUCOSE, CAPILLARY
Glucose-Capillary: 151 mg/dL — ABNORMAL HIGH (ref 70–99)
Glucose-Capillary: 254 mg/dL — ABNORMAL HIGH (ref 70–99)

## 2019-11-27 LAB — BASIC METABOLIC PANEL
Anion gap: 14 (ref 5–15)
BUN: 81 mg/dL — ABNORMAL HIGH (ref 8–23)
CO2: 24 mmol/L (ref 22–32)
Calcium: 10.2 mg/dL (ref 8.9–10.3)
Chloride: 94 mmol/L — ABNORMAL LOW (ref 98–111)
Creatinine, Ser: 3.36 mg/dL — ABNORMAL HIGH (ref 0.44–1.00)
GFR calc Af Amer: 15 mL/min — ABNORMAL LOW (ref >60)
GFR calc non Af Amer: 13 mL/min — ABNORMAL LOW (ref >60)
Glucose, Bld: 97 mg/dL (ref 70–99)
Potassium: 4.8 mmol/L (ref 3.5–5.1)
Sodium: 132 mmol/L — ABNORMAL LOW (ref 135–145)

## 2019-11-27 LAB — MRSA PCR SCREENING: MRSA by PCR: NEGATIVE

## 2019-11-27 MED ORDER — NOREPINEPHRINE 4 MG/250ML-% IV SOLN
0.0000 ug/min | INTRAVENOUS | Status: DC
  Administered 2019-11-27: 5 ug/min via INTRAVENOUS

## 2019-11-27 MED ORDER — DIPHENHYDRAMINE HCL 25 MG PO CAPS
50.0000 mg | ORAL_CAPSULE | Freq: Once | ORAL | Status: AC
  Administered 2019-11-28: 50 mg via ORAL
  Filled 2019-11-27: qty 2

## 2019-11-27 MED ORDER — METOLAZONE 5 MG PO TABS
5.0000 mg | ORAL_TABLET | Freq: Once | ORAL | Status: AC
  Administered 2019-11-27: 5 mg via ORAL
  Filled 2019-11-27: qty 1

## 2019-11-27 MED ORDER — SODIUM CHLORIDE 0.9 % IV SOLN: INTRAVENOUS | Status: DC

## 2019-11-27 MED ORDER — CHLORHEXIDINE GLUCONATE CLOTH 2 % EX PADS
6.0000 | MEDICATED_PAD | Freq: Every day | CUTANEOUS | Status: DC
  Administered 2019-11-27 – 2019-11-30 (×4): 6 via TOPICAL

## 2019-11-27 MED ORDER — NOREPINEPHRINE 4 MG/250ML-% IV SOLN
2.0000 ug/min | INTRAVENOUS | Status: DC
  Administered 2019-11-27 – 2019-11-28 (×2): 5 ug/min via INTRAVENOUS
  Administered 2019-11-28: 3 ug/min via INTRAVENOUS
  Administered 2019-11-29: 4 ug/min via INTRAVENOUS
  Administered 2019-11-30: 5 ug/min via INTRAVENOUS
  Filled 2019-11-27 (×5): qty 250

## 2019-11-27 MED ORDER — SODIUM CHLORIDE 0.9 % IV SOLN
250.0000 mL | INTRAVENOUS | Status: DC
  Administered 2019-11-27: 250 mL via INTRAVENOUS

## 2019-11-27 NOTE — Plan of Care (Signed)

## 2019-11-27 NOTE — Plan of Care (Signed)
  Problem: Education: Goal: Knowledge of General Education information will improve Description: Including pain rating scale, medication(s)/side effects and non-pharmacologic comfort measures 11/27/2019 0417 by Colonel Bald, RN Outcome: Progressing 11/27/2019 0358 by Colonel Bald, RN Outcome: Progressing   Problem: Health Behavior/Discharge Planning: Goal: Ability to manage health-related needs will improve 11/27/2019 0417 by Colonel Bald, RN Outcome: Progressing 11/27/2019 0358 by Colonel Bald, RN Outcome: Progressing   Problem: Clinical Measurements: Goal: Ability to maintain clinical measurements within normal limits will improve 11/27/2019 0417 by Colonel Bald, RN Outcome: Progressing 11/27/2019 0358 by Colonel Bald, RN Outcome: Progressing Goal: Will remain free from infection 11/27/2019 0417 by Colonel Bald, RN Outcome: Progressing 11/27/2019 0358 by Colonel Bald, RN Outcome: Progressing Goal: Diagnostic test results will improve 11/27/2019 0417 by Colonel Bald, RN Outcome: Progressing 11/27/2019 0358 by Colonel Bald, RN Outcome: Progressing Goal: Respiratory complications will improve 11/27/2019 0417 by Colonel Bald, RN Outcome: Progressing 11/27/2019 0358 by Colonel Bald, RN Outcome: Progressing Goal: Cardiovascular complication will be avoided 11/27/2019 0417 by Colonel Bald, RN Outcome: Progressing 11/27/2019 0358 by Colonel Bald, RN Outcome: Progressing

## 2019-11-27 NOTE — Consult Note (Signed)
   [facility] CM Inpatient Consult   11/27/2019  Sara Walker 06-02-1951 [id number]   Patient is currently active with [facility] for chronic disease management services.  Patient has been engaged by a [place] NP Our community based plan of care has focused on disease management and community resource support.    Plan: Follow for disposition and post hospital follow up.  Of note, [facility] Care Management services does not replace or interfere with any services that are needed or arranged by inpatient [facility] care management team.  For additional questions or referrals please contact:  Natividad Brood, RN BSN [facility] Liaison  [phone] business mobile phone Toll free office [phone]  Fax number: [phone] [email or web address]

## 2019-11-27 NOTE — Progress Notes (Addendum)
Pt's BP 80/53. Pt has been complaining of dizziness, weakness, and shortness of breath. Only 50 cc of urine output this shift. Notified Cadence Kathlen Mody PA. Dr. Meda Coffee will be up to the bedside to evaluate patient shortly.

## 2019-11-27 NOTE — Progress Notes (Signed)
Progress Note  Patient Name: RIKITA GRABERT Date of Encounter: 11/27/2019  Primary Cardiologist: Sanda Klein, MD   Subjective   The patient is not feeling well, she continues to feel short of breath and this morning her blood pressure fell into 80s.  Carvedilol and diuretics were held.  Inpatient Medications    Scheduled Meds: . amiodarone  200 mg Oral Daily  . apixaban  5 mg Oral BID  . carvedilol  12.5 mg Oral BID  . insulin glargine  34 Units Subcutaneous Q2200  . multivitamin with minerals  1 tablet Oral Daily  . sodium chloride flush  3 mL Intravenous Once  . sodium chloride flush  3 mL Intravenous Q12H   Continuous Infusions: . sodium chloride    . sodium chloride    . furosemide 120 mg (11/27/19 0600)  . norepinephrine (LEVOPHED) Adult infusion     PRN Meds: sodium chloride, acetaminophen, ALPRAZolam, ondansetron (ZOFRAN) IV, sodium chloride flush, zolpidem   Vital Signs    Vitals:   11/27/19 0600 11/27/19 0601 11/27/19 0824 11/27/19 1101  BP: (!) 91/56  91/72 (!) 80/53  Pulse:  85 79 (!) 101  Resp:  18 (!) 22 14  Temp:  98 F (36.7 C) 97.6 F (36.4 C) 98.1 F (36.7 C)  TempSrc:  Oral Oral Oral  SpO2:  98% 94% 94%  Weight:  114.6 kg    Height:        Intake/Output Summary (Last 24 hours) at 11/27/2019 1239 Last data filed at 11/27/2019 0602 Gross per 24 hour  Intake 544 ml  Output 485 ml  Net 59 ml   Last 3 Weights 11/27/2019 11/26/2019 11/26/2019  Weight (lbs) 252 lb 9.6 oz 252 lb 252 lb  Weight (kg) 114.579 kg 114.306 kg 114.306 kg      Telemetry    Atrial fibrillation with ventricular rates in 90s- Personally Reviewed  ECG    No new tracing- Personally Reviewed  Physical Exam   GEN: No acute distress.   Neck: No JVD Cardiac: iRRR, no murmurs, rubs, or gallops.  Respiratory:  Crackles to auscultation bilaterally. GI: Soft, nontender, distended  MS:  2+ bilateral; No deformity. Neuro:  Nonfocal  Psych: Normal affect   Labs      High Sensitivity Troponin:   Recent Labs  Lab 12/05/2019 1331 11/21/2019 1517  TROPONINIHS 43* 43*      Chemistry Recent Labs  Lab 11/13/2019 1331 11/26/19 0457 11/27/19 0429  NA 132* 132* 132*  K 5.3* 5.2* 4.8  CL 93* 94* 94*  CO2 21* 23 24  GLUCOSE 153* 103* 97  BUN 71* 78* 81*  CREATININE 2.74* 3.26* 3.36*  CALCIUM 9.7 10.1 10.2  PROT 5.7*  --   --   ALBUMIN 3.7  --   --   AST 21  --   --   ALT 36  --   --   ALKPHOS 74  --   --   BILITOT 3.1*  --   --   GFRNONAA 17* 14* 13*  GFRAA 20* 16* 15*  ANIONGAP 18* 15 14     Hematology Recent Labs  Lab 12/08/2019 1331 11/26/19 0457 11/27/19 0429  WBC 6.7 7.1 6.0  RBC 4.71 4.55 4.55  HGB 12.9 12.1 12.1  HCT 41.0 38.7 38.8  MCV 87.0 85.1 85.3  MCH 27.4 26.6 26.6  MCHC 31.5 31.3 31.2  RDW 17.7* 17.5* 17.8*  PLT 291 315 270   BNP Recent Labs  Lab 12/03/2019  1331  BNP 1,081.7*    DDimer No results for input(s): DDIMER in the last 168 hours.   Radiology    DG Chest Port 1 View  Result Date: 11/24/2019 CLINICAL DATA:  CP ,SOB. HX of pacemaker. EXAM: PORTABLE CHEST - 1 VIEW COMPARISON:  09/30/2019 FINDINGS: Left retrocardiac atelectasis/consolidation obscuring the left diaphragmatic leaflet. Right lung clear. Leftward deviation of the trachea secondary to right paratracheal mass probably goiter as previously described. Stable cardiomegaly. Stable left subclavian AICD. Can not exclude small left effusion. No pneumothorax. Surgical clips right axilla. IMPRESSION: Persistent left retrocardiac atelectasis/consolidation. Electronically Signed   By: Lucrezia Europe M.D.   On: 11/19/2019 13:40   Cardiac Studies    Patient Profile     69 y.o. female with pmh of chronic combined CHF, NICM with EF 25-30% s/p CRT-D with no LVEF improvement, many meds intolerances including Entresto, PAF on Eliquis, ICD shocks for polymorphic VT in 2016 in the setting of hypokalemia, LBBB, HTN, HLD, IDDM2, CKD sage IV, h/o of uterine bleeding with IUD,  anxiety and depression who is being seen for shortness of breath, orthopnea, PND, LE edema, increased abdominal girth. Scheduled PPM transmission today showed Aflutter that had been ongoing since early April, BiV pacing 78% of the time. Overall weight gain 5 lbs, inability to sleep.   Admitted in 09/2019 - ac on chr CHF sec to loss of resynchronization pacing and being in atrial flutter with RVR. She was treated with IV lasix and kidney function worsened and nephrology was consulted who recommended Torsemide and metolazone. She had TEE 09/20/19 for DCCV but was unable to perform due to early forming thrombus in the left atrial appendage. Eliquis was started and she was loaded with amiodarone therapy. Discharge weight was 250lbs. Nephrology increased torsemide to 100 mg daily and spiro 12.5mg  daily started on 11/15/2019.  On physical exam she seems slightly short of breath in upright position, her lungs have minimal crackles, there is no significant murmur and regular rhythm, her lower extremity seems to have 2+ edema bilaterally.  In the ED BP 85/61, pulse 87, RR 24, afebrile, 100% O2, weight 252 lbs. Edema noted on exam. EKG showed Afib with BiV paced rhythm at 86 bpm. Labs show potassium 5.3, creatinine 2.74. WBC 6.7, Hgb 12.9, BNP 1,081. Hs trop 43>43. CXR showed persistent left retrocardiac atelectasis/consolidation.   Assessment & Plan    Acute on chronic combined systolic diastolic CHF Chronic persistent atrial fibrillation/atrial flutter with RVR Nonischemic cardiomyopathy, LVEF 25 to 30% Status post CRT-D Acute on chronic kidney failure, CKD stage IV  We restarted Lasix on admission with minimal diuresis, the patient was seen by nephrology yesterday Lasix 120 mg IV every 8 hours and added metolazone 5 mg daily with minimal diuresis overnight, necessity of potential CRRT was discussed "Likely will need CRRT to UF for 5 days but she discussed with family and the pt herself does not want to do  this. There is a very good chance she may not come off RRT after the CRRT".  The patient does not want to be a burden to her family and she is opposed to the idea, she is experiencing cardiorenal syndrome, I will start her on Levophed move her to the unit, I am very hopeful that with TEE and cardioversion she might improve, this is planned for tomorrow. She previously underwent TEE with planned cardioversion however she had a thrombus in her left atrial appendage. She has not missed any doses of Eliquis.  We  will follow her creatinine and electrolytes closely. We will involve EP as she has been on amiodarone to see what will be long-term goal for her atrial fibrillation.  For questions or updates, please contact [place] Please consult [email or web address] for contact info under     Signed, Ena Dawley, MD  11/27/2019, 12:39 PM

## 2019-11-27 NOTE — Progress Notes (Addendum)
[facility] Progress Note   69 y.o. female combined CHF NICM (chemotherapy) EF 25-30% s/p difibrillator, PAD on Eliquis, HTN HLD DM CKD4 w/ echo showing global hypokinesis. She was seen by renal in 09/2019 and started on Torsemide and metolazone. Recently she was seen by renal and Torsemide was increased to 100mg  daily and initiation of spironolactone 12.5mg . P/w  dyspnea, CP (tightness) , SOB w/ exertion, orthopnea and anorexia. Her BL cr is ~1.8-2.1 and was seen earlier this month at [facility] with Dr. Moshe Cipro w/ escalation of her torsemide dose to 100mg  daily and addition of aldactone as well. Since this admission her cr  has increased from 2.74 to 3.26.   Assessment/ Plan:   1. AKI on CKDIIIb - BL cr 1.8-2.1 with acute component secondary to cardiorenal syndrome complicated by the paroxysmal afib/flutter.   - Strict I&O's and fluid restriction to 1.2L  - Lasix 120 mg IV q 8hr and will add metolazone 5mg  (one dose only unless she is responding and then will change to daily); very poor response to the Lasix. - Likely will need CRRT to UF for 5 days but she discussed with family and the pt herself does not want to do this. There is a very good chance she may not come off RRT after the CRRT.  - She definitely does not want long term  RRT and she almost certainly would not tolerate it.  - Recommend a palliative consult.  2. Afib/flutter - possible TEE/DCCV 3. Combined heart failure with an acute component as well. 4. NICM s/p CRT-D 5. HTN   Subjective:   Feeling poorly, dizzy, nauseous only last night. Denies f/c.    Objective:   BP (!) 91/56   Pulse 85   Temp 98 F (36.7 C) (Oral)   Resp 18   Ht 5\' 8"  (1.727 m)   Wt 114.6 kg   SpO2 98%   BMI 38.41 kg/m   Intake/Output Summary (Last 24 hours) at 11/27/2019 0813 Last data filed at 11/27/2019 0602 Gross per 24 hour  Intake 544 ml  Output 515 ml  Net 29 ml   Weight change: 0.272 kg  Physical Exam: GEN: NAD,  A&Ox3, NCAT HEENT: No conjunctival pallor, EOMI NECK: Supple, no thyromegaly LUNGS:  Rales at bases CV: tachy, harsh systolic murmur ABD: SNDNT +BS  EXT: 3+ lower extremity edema    Imaging: DG Chest Port 1 View  Result Date: 11/18/2019 CLINICAL DATA:  CP ,SOB. HX of pacemaker. EXAM: PORTABLE CHEST - 1 VIEW COMPARISON:  09/30/2019 FINDINGS: Left retrocardiac atelectasis/consolidation obscuring the left diaphragmatic leaflet. Right lung clear. Leftward deviation of the trachea secondary to right paratracheal mass probably goiter as previously described. Stable cardiomegaly. Stable left subclavian AICD. Can not exclude small left effusion. No pneumothorax. Surgical clips right axilla. IMPRESSION: Persistent left retrocardiac atelectasis/consolidation. Electronically Signed   By: Lucrezia Europe M.D.   On: 11/14/2019 13:40    Labs: BMET Recent Labs  Lab 11/28/2019 1331 11/26/19 0457 11/27/19 0429  NA 132* 132* 132*  K 5.3* 5.2* 4.8  CL 93* 94* 94*  CO2 21* 23 24  GLUCOSE 153* 103* 97  BUN 71* 78* 81*  CREATININE 2.74* 3.26* 3.36*  CALCIUM 9.7 10.1 10.2   CBC Recent Labs  Lab 12/09/2019 1331 11/26/19 0457 11/27/19 0429  WBC 6.7 7.1 6.0  HGB 12.9 12.1 12.1  HCT 41.0 38.7 38.8  MCV 87.0 85.1 85.3  PLT 291 315 270    Medications:    .  amiodarone  200 mg Oral Daily  . apixaban  5 mg Oral BID  . carvedilol  12.5 mg Oral BID  . insulin glargine  34 Units Subcutaneous Q2200  . multivitamin with minerals  1 tablet Oral Daily  . sodium chloride flush  3 mL Intravenous Once  . sodium chloride flush  3 mL Intravenous Q12H      Otelia Santee, MD 11/27/2019, 8:13 AM

## 2019-11-28 ENCOUNTER — Inpatient Hospital Stay (HOSPITAL_COMMUNITY): Payer: Medicare Other

## 2019-11-28 ENCOUNTER — Encounter (HOSPITAL_COMMUNITY): Payer: Self-pay | Admitting: Cardiology

## 2019-11-28 ENCOUNTER — Inpatient Hospital Stay (HOSPITAL_COMMUNITY): Payer: Medicare Other | Admitting: Certified Registered"

## 2019-11-28 ENCOUNTER — Encounter (HOSPITAL_COMMUNITY): Admission: EM | Disposition: E | Payer: Self-pay | Source: Home / Self Care | Attending: Cardiology

## 2019-11-28 DIAGNOSIS — I428 Other cardiomyopathies: Secondary | ICD-10-CM

## 2019-11-28 DIAGNOSIS — I4891 Unspecified atrial fibrillation: Secondary | ICD-10-CM

## 2019-11-28 DIAGNOSIS — Z95810 Presence of automatic (implantable) cardiac defibrillator: Secondary | ICD-10-CM

## 2019-11-28 DIAGNOSIS — I340 Nonrheumatic mitral (valve) insufficiency: Secondary | ICD-10-CM

## 2019-11-28 DIAGNOSIS — I484 Atypical atrial flutter: Secondary | ICD-10-CM

## 2019-11-28 HISTORY — PX: CARDIOVERSION: SHX1299

## 2019-11-28 HISTORY — PX: TEE WITHOUT CARDIOVERSION: SHX5443

## 2019-11-28 LAB — BASIC METABOLIC PANEL
Anion gap: 16 — ABNORMAL HIGH (ref 5–15)
BUN: 83 mg/dL — ABNORMAL HIGH (ref 8–23)
CO2: 20 mmol/L — ABNORMAL LOW (ref 22–32)
Calcium: 9.5 mg/dL (ref 8.9–10.3)
Chloride: 91 mmol/L — ABNORMAL LOW (ref 98–111)
Creatinine, Ser: 3.47 mg/dL — ABNORMAL HIGH (ref 0.44–1.00)
GFR calc Af Amer: 15 mL/min — ABNORMAL LOW (ref >60)
GFR calc non Af Amer: 13 mL/min — ABNORMAL LOW (ref >60)
Glucose, Bld: 194 mg/dL — ABNORMAL HIGH (ref 70–99)
Potassium: 4.4 mmol/L (ref 3.5–5.1)
Sodium: 127 mmol/L — ABNORMAL LOW (ref 135–145)

## 2019-11-28 LAB — CBC
HCT: 39.7 % (ref 36.0–46.0)
Hemoglobin: 12.4 g/dL (ref 12.0–15.0)
MCH: 26.8 pg (ref 26.0–34.0)
MCHC: 31.2 g/dL (ref 30.0–36.0)
MCV: 85.7 fL (ref 80.0–100.0)
Platelets: 321 10*3/uL (ref 150–400)
RBC: 4.63 MIL/uL (ref 3.87–5.11)
RDW: 17.9 % — ABNORMAL HIGH (ref 11.5–15.5)
WBC: 7.5 10*3/uL (ref 4.0–10.5)
nRBC: 0.5 % — ABNORMAL HIGH (ref 0.0–0.2)

## 2019-11-28 LAB — GLUCOSE, CAPILLARY
Glucose-Capillary: 132 mg/dL — ABNORMAL HIGH (ref 70–99)
Glucose-Capillary: 266 mg/dL — ABNORMAL HIGH (ref 70–99)
Glucose-Capillary: 271 mg/dL — ABNORMAL HIGH (ref 70–99)

## 2019-11-28 SURGERY — ECHOCARDIOGRAM, TRANSESOPHAGEAL
Anesthesia: Monitor Anesthesia Care

## 2019-11-28 MED ORDER — PROPOFOL 500 MG/50ML IV EMUL
INTRAVENOUS | Status: DC | PRN
  Administered 2019-11-28: 50 ug/kg/min via INTRAVENOUS

## 2019-11-28 MED ORDER — AMIODARONE IV BOLUS ONLY 150 MG/100ML
150.0000 mg | Freq: Once | INTRAVENOUS | Status: AC
  Administered 2019-11-28: 150 mg via INTRAVENOUS
  Filled 2019-11-28: qty 100

## 2019-11-28 MED ORDER — AMIODARONE LOAD VIA INFUSION
150.0000 mg | Freq: Once | INTRAVENOUS | Status: AC
  Administered 2019-11-28: 150 mg via INTRAVENOUS
  Filled 2019-11-28: qty 83.34

## 2019-11-28 MED ORDER — AMIODARONE HCL IN DEXTROSE 360-4.14 MG/200ML-% IV SOLN
60.0000 mg/h | INTRAVENOUS | Status: AC
  Administered 2019-11-28 (×2): 60 mg/h via INTRAVENOUS
  Filled 2019-11-28 (×2): qty 200

## 2019-11-28 MED ORDER — AMIODARONE LOAD VIA INFUSION: 150.0000 mg | Freq: Once | INTRAVENOUS | Status: DC

## 2019-11-28 MED ORDER — INSULIN ASPART 100 UNIT/ML ~~LOC~~ SOLN: 0.0000 [IU] | Freq: Three times a day (TID) | SUBCUTANEOUS | Status: DC

## 2019-11-28 MED ORDER — PROPOFOL 10 MG/ML IV BOLUS
INTRAVENOUS | Status: DC | PRN
  Administered 2019-11-28 (×2): 20 mg via INTRAVENOUS

## 2019-11-28 MED ORDER — BUTAMBEN-TETRACAINE-BENZOCAINE 2-2-14 % EX AERO
INHALATION_SPRAY | CUTANEOUS | Status: DC | PRN
  Administered 2019-11-28: 2 via TOPICAL

## 2019-11-28 MED ORDER — INSULIN DEGLUDEC 200 UNIT/ML ~~LOC~~ SOPN
34.0000 [IU] | PEN_INJECTOR | Freq: Every day | SUBCUTANEOUS | Status: DC
  Administered 2019-11-28 – 2019-11-30 (×3): 34 [IU] via SUBCUTANEOUS

## 2019-11-28 MED ORDER — AMIODARONE HCL IN DEXTROSE 360-4.14 MG/200ML-% IV SOLN
30.0000 mg/h | INTRAVENOUS | Status: DC
  Administered 2019-11-29 – 2019-12-01 (×6): 30 mg/h via INTRAVENOUS
  Filled 2019-11-28 (×5): qty 200

## 2019-11-28 MED ORDER — SODIUM CHLORIDE 0.9 % IV SOLN
INTRAVENOUS | Status: AC | PRN
  Administered 2019-11-28: 500 mL via INTRAVENOUS

## 2019-11-28 NOTE — Consult Note (Addendum)
Cardiology Consultation:   Patient ID: Sara Walker MRN: [id number]; DOB: 22-Aug-1950  Admit date: 11/24/2019 Date of Consult: 11/16/2019  Primary Care Provider: Deland Pretty, MD Primary Cardiologist: Sanda Juli Odom, MD    Patient Profile:   Sara Walker is a 69 y.o. female with a hx of chronic combined CHF, NICM with EF 25-30% (suspect 2/2 chemo) s/p CRT-D, PAF on Eliquis, ICD shocks for polymorphic VT in 2016 in the setting of hypokalemia, LBBB, HTN, HLD, DM2 on insulin pump, CKD sage IV, h/o of uterine bleeding with IUD, anxiety and depression  who is being seen today for the evaluation of Aflutter at the request of Dr. Meda Coffee.   Device information SJM CRT-D, implanted 2013, gen change 2019  History of Present Illness:   [name] was admitted to [facility]/14/2019 with CP, SOB, feeling poorly since her last admission in March. Back then, the office was alerted of atrial tachycardia from 3/5-3/7 and the patient was admitted for afib RVR and acute heart failure.  Echo showed EF 20-30% with global hypokinesis and septal lateral dyssynergy consistent with loss of resynchronization pacing.  She was treated with IV lasix and kidney function worsened and nephrology was consulted who recommended Torsemide and metolazone.  She had TEE 09/20/19 for DCCV but was unable to perform due to early forming thrombus in the left atrial appendage.  Eliquis was started and she was loaded with amiodarone therapy.  Discharge weight was 250lbs. She was discharged with torsemide 50 mg daily.  She was seen 11/08/19 and had reported weight gain that resolved with metolazone and torsemide.  She saw nephrology May 7th and torsemide was doubled to 100 mg daily and spiro 12.5mg  daily started.  This initially this improved weight status but after the second week she noted a 5 lb weight gain.  Scheduled PPM transmission 5/17 showed Aflutter that had been ongoing since early April, BiV pacing 78% of the time. Patient  was contacted who reported she had generally feeling horrible and had a 4.5lbs weight gain and difficulty sleeping due to frequent urination and sob. ED eval recommended ED evaluation  ED BP 85/61, pulse 87, RR 24, afebrile, 100% O2, weight 252 lbs. Edema noted on exam. EKG showed Afib with BiV paced rhythm at 86 bpm. Labs show potassium 5.3, creatinine 2.74. WBC 6.7, Hgb 12.9, BNP 1,081. Hs trop 43>43. CXR showed persistent left retrocardiac atelectasis/consolidation   She was admitted, IV diuresis started,  Pl;anned for TEE/DCCV when able.  The following day still feeling poorly, no response to lasix and nephrology consulted. the patient was seen by nephrology yesterday Lasix 120 mg IV every 8 hours and added metolazone 5 mg daily with minimal diuresis overnight, necessity of potential CRRT was discussed "Likely will need CRRT to UF for 5 days but she discussed with family and the pt herself does not want to do this. There is a very good chance she may not come off RRT after the CRRT". Yesterday levophed was added given her coreg and diuretics had been held 2/2 low BPs Felt to have cariorenal syndrome Today she was feeling a bit better, finally diuresed some (1L) yesterday, levophed increased this AM to better support her t did not want picc line placed (and noted as well h/o breast surgery including lymph nodes removal on the right side and ICD placement on the left, not ideal)  Today she underwent TEE (no thrombus)/DCCV, note reports shocked x3, each time (via her device) had 5-10 seconds only of  AV pacing with early return of AFlutter, also mentions attempt at overdrive pacing failed.  EP is asked to weigh in.  NA 132 >>> 127 K+ 5.3 >>> 4.4 BUN/Creat 71/2.74 >3.25 > 3.36 > 3.47 BNP 1081 WBC 6.7 H/H 12/41 Plts 291   She has put out so far today another liter   She feels terrible.  Tells me she had not slept in months,  Unable to lay down comfortably with SOB, and is also very  convinced the amio has caused  Insomia, She thinks the COVID vaccine is what has set this cascade of events into motion.  She has no active CP, mentions she has felt so bad for so long, not sure what is worse now then anything else.  She will not consider any kind of dialysis, but agreeable to medicines     Past Medical History:  Diagnosis Date  . Angina   . Anxiety   . Arthritis   . Atrial fibrillation ([place])   . Automatic implantable cardioverter-defibrillator in situ   . Breast cancer ([place])   . Cardiomyopathy secondary to chemotherapy [facility])    a.  doxurubicin;  b. s/p SJM Quadra Assura CRT-D, model [id number] Q, Ser# [id number]  . CHF (congestive heart failure) ([place])   . Chronic kidney disease    Stage 3  . Chronic systolic heart failure ([place])   . Coronary artery disease   . Depression   . DM2 (diabetes mellitus, type 2) ([place])   . Goiter   . Gout   . Hyperlipidemia   . Hypertension   . Invasive ductal carcinoma of breast ([place])   . LBBB (left bundle branch block)    evidnece of RBBB fall 2012  . 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 PAF (paroxysmal atrial fibrillation) ([place])   . PONV (postoperative nausea and vomiting)    nausea post ICD placement relieved with Zofran  . Shortness of breath   . Syncope     Past Surgical History:  Procedure Laterality Date  . BI-VENTRICULAR IMPLANTABLE CARDIOVERTER DEFIBRILLATOR N/A 12/12/2011   Procedure: BI-VENTRICULAR IMPLANTABLE CARDIOVERTER DEFIBRILLATOR  (CRT-D);  Surgeon: Deboraha Sprang, MD;  Location: [facility] CATH LAB;  Service: Cardiovascular;  Laterality: N/A;  . BIV ICD GENERATOR CHANGEOUT N/A 02/23/2018   Procedure: BIV ICD GENERATOR CHANGEOUT;  Surgeon: Sanda Aras Albarran, MD;  Location: [place] CV LAB;  Service: Cardiovascular;  Laterality: N/A;  . BREAST SURGERY     Left mastectomy  . BUBBLE STUDY  09/20/2019   Procedure: BUBBLE STUDY;  Surgeon: Sueanne Margarita, MD;  Location: [place];  Service:  Cardiovascular;;  . CARDIAC DEFIBRILLATOR PLACEMENT     [place]  . DILATION AND CURETTAGE OF UTERUS  1975  . FOOT SURGERY  1994  . HYSTEROSCOPY WITH D & C N/A 04/04/2019   Procedure: DILATATION AND CURETTAGE /HYSTEROSCOPY;  Surgeon: Everlene Farrier, MD;  Location: [place];  Service: Gynecology;  Laterality: N/A;  . INTRAUTERINE DEVICE (IUD) INSERTION N/A 04/04/2019   Procedure: INTRAUTERINE DEVICE (IUD) INSERTION;  Surgeon: Everlene Farrier, MD;  Location: [place];  Service: Gynecology;  Laterality: N/A;  Dr. Gaetano Net to bring IUD from office  . MASTECTOMY     right  . NM MYOCAR PERF WALL MOTION  12/01/2011   no ischemia; EF 22%  . [place]  . TEE WITHOUT CARDIOVERSION N/A 09/20/2019   Procedure: TRANSESOPHAGEAL ECHOCARDIOGRAM (TEE);  Surgeon: Sueanne Margarita, MD;  Location: [facility] ENDOSCOPY;  Service: Cardiovascular;  Laterality: N/A;  . TUNNELED VENOUS CATHETER PLACEMENT     removed     Home Medications:  Prior to Admission medications   Medication Sig Start Date End Date Taking? Authorizing Provider  allopurinol (ZYLOPRIM) 300 MG tablet Take 450 mg by mouth daily.    Yes [provider]  apixaban (ELIQUIS) 5 MG TABS tablet Take 1 tablet (5 mg total) by mouth 2 (two) times daily. 11/19/19  Yes Croitoru, Mihai, MD  carvedilol (COREG) 25 MG tablet Take 1 tablet (25 mg total) by mouth 2 (two) times daily. 10/10/18  Yes Croitoru, Mihai, MD  Cholecalciferol (VITAMIN D) 2000 units CAPS Take 4,000 Units by mouth daily.    Yes [provider]  Insulin Degludec (TRESIBA) 100 UNIT/ML SOLN Inject 34 Units into the skin daily.   Yes [provider]  Lutein 20 MG CAPS Take 20 mg by mouth daily.    Yes [provider]  PACERONE 200 MG tablet Take 1 tablet by mouth once daily 11/15/19  Yes Croitoru, Mihai, MD  potassium chloride SA (KLOR-CON) 20 MEQ tablet Take 2 tablets (40 mEq total) by mouth 2 (two) times daily. Patient  taking differently: Take 40 mEq by mouth daily.  09/26/19  Yes Florencia Reasons, MD  spironolactone (ALDACTONE) 25 MG tablet Take 25 mg by mouth daily.   Yes [provider]  torsemide (DEMADEX) 100 MG tablet Take 1 tablet by mouth once daily Patient taking differently: Take 100 mg by mouth daily.  11/11/19  Yes Almyra Deforest, PA    Inpatient Medications: Scheduled Meds: . amiodarone  200 mg Oral Daily  . apixaban  5 mg Oral BID  . Chlorhexidine Gluconate Cloth  6 each Topical Daily  . insulin aspart  0-9 Units Subcutaneous TID WC  . insulin glargine  34 Units Subcutaneous Q2200  . multivitamin with minerals  1 tablet Oral Daily  . sodium chloride flush  3 mL Intravenous Once  . sodium chloride flush  3 mL Intravenous Q12H   Continuous Infusions: . sodium chloride    . sodium chloride 100 mL/hr at 11/22/2019 1310  . furosemide Stopped (12/02/2019 0753)  . norepinephrine (LEVOPHED) Adult infusion 5 mcg/min (11/27/2019 1100)   PRN Meds: sodium chloride, acetaminophen, ALPRAZolam, ondansetron (ZOFRAN) IV, sodium chloride flush, zolpidem  Allergies:    Allergies  Allergen Reactions  . Lasix [Furosemide] Other (See Comments)    Causes gout  . Ace Inhibitors Cough  . Codeine Other (See Comments)    Crazy  . Covid-19 (Mrna) Vaccine (Moderna) [Covid-19 (Mrna) Vaccine]     Shortness of breath  . Digitalis Other (See Comments)    "saw yellow circles"  . Latex Other (See Comments)    Break out.  [place] [Levofloxacin In D5w] Other (See Comments)    Gout   . Other Other (See Comments)    Nitro spray: reaction unknown  . Statins Other (See Comments)    Reaction unknown  . Xarelto [Rivaroxaban]   . Zyrtec [Cetirizine] Other (See Comments)    Slept for a day then was up for 4 days afterwards    Social History:   Social History   Socioeconomic History  . Marital status: Divorced    Spouse name: Not on file  . Number of children: Not on file  . Years of education: Not on file  .  Highest education level: Not on file  Occupational History  . Not on file  Tobacco  Use  . Smoking status: Former Smoker  . Smokeless tobacco: Never Used  Substance and Sexual Activity  . Alcohol use: No    Alcohol/week: 0.0 standard drinks  . Drug use: No  . Sexual activity: Not Currently  Other Topics Concern  . Not on file  Social History Narrative  . Not on file   Social Determinants of Health   Financial Resource Strain: Low Risk   . Difficulty of Paying Living Expenses: Not hard at all  Food Insecurity: No Food Insecurity  . Worried About Charity fundraiser in the Last Year: Never true  . Ran Out of Food in the Last Year: Never true  Transportation Needs: No Transportation Needs  . Lack of Transportation (Medical): No  . Lack of Transportation (Non-Medical): No  Physical Activity: Inactive  . Days of Exercise per Week: 0 days  . Minutes of Exercise per Session: 0 min  Stress: Stress Concern Present  . Feeling of Stress : Very much  Social Connections: Slightly Isolated  . Frequency of Communication with Friends and Family: More than three times a week  . Frequency of Social Gatherings with Friends and Family: More than three times a week  . Attends Religious Services: 1 to 4 times per year  . Active Member of Clubs or Organizations: Yes  . Attends Archivist Meetings: 1 to 4 times per year  . Marital Status: Divorced  Human resources officer Violence: Not At Risk  . Fear of Current or Ex-Partner: No  . Emotionally Abused: No  . Physically Abused: No  . Sexually Abused: No    Family History:   Family History  Problem Relation Age of Onset  . Heart attack Father   . Hypertension Brother   . Diabetes Brother      ROS:  Please see the history of present illness.  All other ROS reviewed and negative.     Physical Exam/Data:   Vitals:   11/20/2019 1313 11/30/2019 1323 12/02/2019 1333 11/14/2019 1400  BP: (!) 111/59 111/60 107/64 101/79  Pulse: 80 80 80   Resp:  (!) 22 (!) 21 19 20   Temp: 97.6 F (36.4 C)     TempSrc: Axillary     SpO2: 96% 95% 96% 95%  Weight:      Height:        Intake/Output Summary (Last 24 hours) at 12/07/2019 1455 Last data filed at 11/26/2019 1400 Gross per 24 hour  Intake 1113.41 ml  Output 2380 ml  Net -1266.59 ml   Last 3 Weights 11/24/2019 11/27/2019 11/27/2019  Weight (lbs) 255 lb 4.7 oz 252 lb 10.4 oz 252 lb 9.6 oz  Weight (kg) 115.8 kg 114.6 kg 114.579 kg     Body mass index is 38.82 kg/m.  General:  Well nourished,, obese in no acute distress HEENT: normal Lymph: no adenopathy Neck: no JVD (sitting in the chair) Endocrine:  No thryomegaly Vascular: No carotid bruits Cardiac: RRR (paced currently); soft SM, no gallops or rubs appreciated Lungs:  Crackles bases b/l, no wheezing, rhonchi or rales  Abd: soft, nontender, obese Ext: marked edema Musculoskeletal:  No deformities Skin: warm and dry  Neuro:  no focal abnormalities noted Psych:  Normal affect   EKG:  The EKG was personally reviewed and demonstrates:    #1 AFlutter (difficut to tell if typical or not), some V paced beats, small initial positive deflection in V1 and I (not as good as clear CRT pacing by older EKG)  #2,  suspect remains AFlutter, ?AFib, no paicng, very boradQRS of 2101ms,  NO R wave progression  Telemetry:  Telemetry was personally reviewed and demonstrates:   Mostly V pacing currently (s/p amio bolus (running)    Relevant CV Studies:  11/26/2019: TEE IMPRESSIONS  1. Left ventricular ejection fraction, by estimation, is 25 to 30%. The  left ventricle has severely decreased function. The left ventricle  demonstrates global hypokinesis. The left ventricular internal cavity size  was severely dilated. There is mild  left ventricular hypertrophy.  2. Right ventricular systolic function is mildly reduced. The right  ventricular size is mildly enlarged.  3. Right atrial size was mildly dilated.  4. The mitral valve is  abnormal. Moderate to severe mitral valve  regurgitation.  5. The tricuspid valve is abnormal. Tricuspid valve regurgitation is  moderate to severe.  6. The aortic valve is tricuspid. Aortic valve regurgitation is not  visualized. No aortic stenosis is present.  7. Evidence of atrial level shunting detected by color flow Doppler.  8. Left atrial size was severely dilated. No left atrial/left atrial  appendage thrombus was detected.    Echo TEE 09/20/19 1. Left ventricular ejection fraction, by estimation, is 25 to 30%. The  left ventricle has severely decreased function. The left ventricle  demonstrates global hypokinesis. The left ventricular internal cavity size  was severely dilated.  2. Right ventricular systolic function is mildly reduced. The right  ventricular size is mildly enlarged.  3. There was a loosely formed thrombus in the body of the LA appendage.  Left atrial size was severely dilated.  4. Right atrial size was mildly dilated.  5. Tricuspid valve regurgitation is moderate to severe.  6. The mitral valve is normal in structure. Moderate to severe mitral  valve regurgitation likley related to MV leaflet teathering and annular  dilatation. THe MR wraps around the LA but does not appear to enter the  pulmonary vein by colorflow doppler but  there is intermittent pulmonary vein flow reversal with PW doppler.  7. The aortic valve is tricuspid. Aortic valve regurgitation is not  visualized. Mild aortic valve sclerosis is present, with no evidence of  aortic valve stenosis.  8. Evidence of atrial level shunting detected by color flow Doppler.  Agitated saline contrast bubble study was positive with shunting observed  within 3-6 cardiac cycles suggestive of interatrial shunt. There is a  small patent foramen ovale with  bidirectional shunting across atrial septum.  9. Mild to moderate aortic atherosclerosis is present in the ascending  and descending aorta.  aorta.   Echo limited 09/19/19 1. Left ventricular ejection fraction, by estimation, is 25 to 30%. The  left ventricle has severely decreased function. The left ventricle  demonstrates global hypokinesis. The left ventricular internal cavity size  was severely dilated. There is mild  concentric left ventricular hypertrophy. Left ventricular diastolic  parameters are indeterminate.  2. Mild mitral valve regurgitation.  3. The aortic valve is tricuspid.  4. There is moderately elevated pulmonary artery systolic pressure.  5. The inferior vena cava is dilated in size with <50% respiratory  variability, suggesting right atrial pressure of 15 mmHg.   2015: LVEF 20-25%   Laboratory Data:  High Sensitivity Troponin:   Recent Labs  Lab 11/15/2019 1331 11/26/2019 1517  TROPONINIHS 43* 43*     Chemistry Recent Labs  Lab 11/26/19 0457 11/27/19 0429 11/28/19 0232  NA 132* 132* 127*  K 5.2* 4.8 4.4  CL 94* 94* 91*  CO2 23  24 20*  GLUCOSE 103* 97 194*  BUN 78* 81* 83*  CREATININE 3.26* 3.36* 3.47*  CALCIUM 10.1 10.2 9.5  GFRNONAA 14* 13* 13*  GFRAA 16* 15* 15*  ANIONGAP 15 14 16*    Recent Labs  Lab 11/10/2019 1331  PROT 5.7*  ALBUMIN 3.7  AST 21  ALT 36  ALKPHOS 74  BILITOT 3.1*   Hematology Recent Labs  Lab 11/26/19 0457 11/27/19 0429 11/15/2019 0232  WBC 7.1 6.0 7.5  RBC 4.55 4.55 4.63  HGB 12.1 12.1 12.4  HCT 38.7 38.8 39.7  MCV 85.1 85.3 85.7  MCH 26.6 26.6 26.8  MCHC 31.3 31.2 31.2  RDW 17.5* 17.8* 17.9*  PLT 315 270 321   BNP Recent Labs  Lab 11/13/2019 1331  BNP 1,081.7*    DDimer No results for input(s): DDIMER in the last 168 hours.   Radiology/Studies:   DG Chest Port 1 View Result Date: 11/12/2019 CLINICAL DATA:  CP ,SOB. HX of pacemaker. EXAM: PORTABLE CHEST - 1 VIEW COMPARISON:  09/30/2019 FINDINGS: Left retrocardiac atelectasis/consolidation obscuring the left diaphragmatic leaflet. Right lung clear. Leftward deviation of the trachea  secondary to right paratracheal mass probably goiter as previously described. Stable cardiomegaly. Stable left subclavian AICD. Can not exclude small left effusion. No pneumothorax. Surgical clips right axilla. IMPRESSION: Persistent left retrocardiac atelectasis/consolidation. Electronically Signed   By: Lucrezia Europe M.D.   On: 11/13/2019 13:40     Assessment and Plan:   1. Aflutter (known h/o AFib)     CHA2DS2Vasc is 5, on Eliquis, appropriately dosed for age/weight)  LA is described as severely dilated by the last few TEEs and TTE 09/17/19: TTE measured LA 78mm  PO amiodarone has been increased to 200mg  BID (from daily), she has gotten a bolus IV this afternoon BP is being supported by levophed  Pressor, LA size and obesity will make maintaining SR difficult. Plan for now is Dr. Caryl Comes will try again to pace terminate.  I think IV amiodarone will be needed (she hates the idea of more amio)  Might consider AHF team as well    Dr. Caryl Comes will see the patient later today to try and pace terminate her AFlutter.   For questions or updates, please contact [place] Please consult [email or web address] for contact info under     Signed, Baldwin Jamaica, PA-C  11/12/2019 2:55 PM Atrial flutter-multiple circuits overdriving pacing rate  Failed to cardiovert/pace terminate  Ventricular tachycardia-polymorphic in the past with hypokalemia  Nonischemic cardiomyopathy  CRT-D-[employer]  Acute on chronic kidney disease  By description, Dr. Meda Coffee was able to terminate atrial flutter and it resumed immediately thereafter.  This is unusual for atrial flutter; however, internal electrograms of her atrium observed from her defibrillator lead are very fractionated suggesting a very abnormal atrium.  We attempted to pace terminate the flutter/fib.  There was some AA variability so the little bit hard to tell but we were able to entrain it so I did interpreted that as a flutter we were not able to  capture however more than about 20-30 ms faster than the atrial cycle length.  We then try to induce fib which would be due for only seconds at a time before he reverted to 1 of 3 flutters either a cycle length of 200 ms a cycle length of 240 ms were cycle length of 280 ms.  We elected to reprogram her device at 90 bpm so as to overdrive her intrinsic conduction and to try to  maximize resynchronization in the hope that this would help improve hemodynamics.  AV ablation would do no more.

## 2019-11-28 NOTE — Transfer of Care (Signed)
Immediate Anesthesia Transfer of Care Note  Patient: Sara Walker  Procedure(s) Performed: TRANSESOPHAGEAL ECHOCARDIOGRAM (TEE) (N/A ) CARDIOVERSION (N/A )  Patient Location: Endoscopy Unit  Anesthesia Type:General  Level of Consciousness: drowsy and patient cooperative  Airway & Oxygen Therapy: Patient Spontanous Breathing and Patient connected to face mask oxygen  Post-op Assessment: Report given to RN and Post -op Vital signs reviewed and stable  Post vital signs: Reviewed and stable  Last Vitals:  Vitals Value Taken Time  BP 111/59 11/27/2019 1313  Temp 36.4 C 12/03/2019 1313  Pulse 79 12/07/2019 1315  Resp 24 11/09/2019 1315  SpO2 100 % 11/10/2019 1315  Vitals shown include unvalidated device data.  Last Pain:  Vitals:   11/13/2019 1313  TempSrc: Axillary  PainSc: 0-No pain      Patients Stated Pain Goal: 0 (85/50/15 8682)  Complications: No apparent anesthesia complications

## 2019-11-28 NOTE — H&P (View-Only) (Signed)
Progress Note  Patient Name: Sara Walker Date of Encounter: 12/03/2019  Primary Cardiologist: Sanda Klein, MD   Subjective   The patient is feeling slightly better she was able to diurese 1 L overnight.  Carvedilol was held because of hypotension.  Inpatient Medications    Scheduled Meds: . amiodarone  200 mg Oral Daily  . apixaban  5 mg Oral BID  . carvedilol  12.5 mg Oral BID  . Chlorhexidine Gluconate Cloth  6 each Topical Daily  . insulin glargine  34 Units Subcutaneous Q2200  . multivitamin with minerals  1 tablet Oral Daily  . sodium chloride flush  3 mL Intravenous Once  . sodium chloride flush  3 mL Intravenous Q12H   Continuous Infusions: . sodium chloride    . sodium chloride 10 mL/hr at 12/03/2019 0500  . sodium chloride Stopped (11/21/2019 0653)  . furosemide 62 mL/hr at 11/20/2019 0700  . norepinephrine (LEVOPHED) Adult infusion 2 mcg/min (11/17/2019 0700)   PRN Meds: sodium chloride, acetaminophen, ALPRAZolam, ondansetron (ZOFRAN) IV, sodium chloride flush, zolpidem   Vital Signs    Vitals:   12/05/2019 0400 11/30/2019 0500 11/25/2019 0700 11/30/2019 0736  BP: 100/60 107/84 (!) 96/59   Pulse:      Resp: 19 (!) 21 20   Temp:    97.6 F (36.4 C)  TempSrc:      SpO2: 90% 96% 93%   Weight:  115.8 kg    Height:        Intake/Output Summary (Last 24 hours) at 12/09/2019 0806 Last data filed at 12/09/2019 0700 Gross per 24 hour  Intake 906.71 ml  Output 1350 ml  Net -443.29 ml   Last 3 Weights 11/12/2019 11/27/2019 11/27/2019  Weight (lbs) 255 lb 4.7 oz 252 lb 10.4 oz 252 lb 9.6 oz  Weight (kg) 115.8 kg 114.6 kg 114.579 kg      Telemetry    Atrial fibrillation with ventricular rates in 90s- Personally Reviewed  ECG    No new tracing- Personally Reviewed  Physical Exam   GEN: No acute distress.  Sitting in chair Neck: No JVD while upright Cardiac: iRRR, no murmurs, rubs, or gallops.  Respiratory:  Crackles to auscultation bilaterally. GI: Soft,  nontender, distended  MS:  2+ bilateral; No deformity. Neuro:  Nonfocal  Psych: Normal affect   Labs    High Sensitivity Troponin:   Recent Labs  Lab 12/08/2019 1331 11/17/2019 1517  TROPONINIHS 43* 43*      Chemistry Recent Labs  Lab 11/27/2019 1331 11/15/2019 1331 11/26/19 0457 11/27/19 0429 11/28/19 0232  NA 132*   < > 132* 132* 127*  K 5.3*   < > 5.2* 4.8 4.4  CL 93*   < > 94* 94* 91*  CO2 21*   < > 23 24 20*  GLUCOSE 153*   < > 103* 97 194*  BUN 71*   < > 78* 81* 83*  CREATININE 2.74*   < > 3.26* 3.36* 3.47*  CALCIUM 9.7   < > 10.1 10.2 9.5  PROT 5.7*  --   --   --   --   ALBUMIN 3.7  --   --   --   --   AST 21  --   --   --   --   ALT 36  --   --   --   --   ALKPHOS 74  --   --   --   --   BILITOT 3.1*  --   --   --   --  GFRNONAA 17*   < > 14* 13* 13*  GFRAA 20*   < > 16* 15* 15*  ANIONGAP 18*   < > 15 14 16*   < > = values in this interval not displayed.     Hematology Recent Labs  Lab 11/26/19 0457 11/27/19 0429 11/17/2019 0232  WBC 7.1 6.0 7.5  RBC 4.55 4.55 4.63  HGB 12.1 12.1 12.4  HCT 38.7 38.8 39.7  MCV 85.1 85.3 85.7  MCH 26.6 26.6 26.8  MCHC 31.3 31.2 31.2  RDW 17.5* 17.8* 17.9*  PLT 315 270 321   BNP Recent Labs  Lab 11/20/2019 1331  BNP 1,081.7*    DDimer No results for input(s): DDIMER in the last 168 hours.   Radiology    No results found. Cardiac Studies    Patient Profile     69 y.o. female with pmh of chronic combined CHF, NICM with EF 25-30% s/p CRT-D with no LVEF improvement, many meds intolerances including Entresto, PAF on Eliquis, ICD shocks for polymorphic VT in 2016 in the setting of hypokalemia, LBBB, HTN, HLD, IDDM2, CKD sage IV, h/o of uterine bleeding with IUD, anxiety and depression who is being seen for shortness of breath, orthopnea, PND, LE edema, increased abdominal girth. Scheduled PPM transmission today showed Aflutter that had been ongoing since early April, BiV pacing 78% of the time. Overall weight gain 5 lbs,  inability to sleep.   Admitted in 09/2019 - ac on chr CHF sec to loss of resynchronization pacing and being in atrial flutter with RVR. She was treated with IV lasix and kidney function worsened and nephrology was consulted who recommended Torsemide and metolazone. She had TEE 09/20/19 for DCCV but was unable to perform due to early forming thrombus in the left atrial appendage. Eliquis was started and she was loaded with amiodarone therapy. Discharge weight was 250lbs. Nephrology increased torsemide to 100 mg daily and spiro 12.5mg  daily started on 11/15/2019.  On physical exam she seems slightly short of breath in upright position, her lungs have minimal crackles, there is no significant murmur and regular rhythm, her lower extremity seems to have 2+ edema bilaterally.  In the ED BP 85/61, pulse 87, RR 24, afebrile, 100% O2, weight 252 lbs. Edema noted on exam. EKG showed Afib with BiV paced rhythm at 86 bpm. Labs show potassium 5.3, creatinine 2.74. WBC 6.7, Hgb 12.9, BNP 1,081. Hs trop 43>43. CXR showed persistent left retrocardiac atelectasis/consolidation.   Assessment & Plan    Acute on chronic combined systolic diastolic CHF, end-stage Chronic persistent atrial fibrillation/atrial flutter with RVR Nonischemic cardiomyopathy, LVEF 25 to 30% Status post CRT-D Acute on chronic kidney failure, CKD stage IV Cardiorenal syndrome  We restarted Lasix on admission with minimal diuresis, the patient is being followed by nephrology, they added Lasix 120 mg IV every 8 hours and added metolazone 5 mg daily with minimal diuresis overnight, necessity of potential CRRT was discussed "Likely will need CRRT to UF for 5 days but she discussed with family and the pt herself does not want to do this. There is a very good chance she may not come off RRT after the CRRT". The patient does not want to be a burden to her family and she is opposed to the idea. She is in cardiorenal syndrome, I started Levophed  yesterday at low-dose of 2 mcg/kg/min, she diuresed 1 L, she also had diarrhea overnight, I do not believe weights are accurate.  I will increase Levophed to 5  mcg/kg/min I am very hopeful that with TEE and cardioversion she might improve, this is scheduled for today.  She previously underwent TEE with planned cardioversion however she had a thrombus in her left atrial appendage. She has not missed any doses of Eliquis.   Ideally I would place a central line to monitor her Coox, however it is complicated in her case she had breast surgery including lymph nodes removal on the right side and ICD placement on the left.  She does not wish to have a PICC line placed as she had a traumatic experience yesterday with IV line placement.  We will hold off for now.    We will follow her creatinine and electrolytes closely.  Fattening today 3.47, potassium 4.4, sodium 127 - decreasing, we will consider tolvaptan.. We will involve EP as she has been on amiodarone to see what will be long-term goal for her atrial fibrillation.  For questions or updates, please contact [place] Please consult [email or web address] for contact info under     Signed, Ena Dawley, MD  11/18/2019, 8:06 AM

## 2019-11-28 NOTE — CV Procedure (Addendum)
   TRANSESOPHAGEAL ECHOCARDIOGRAM GUIDED DIRECT CURRENT CARDIOVERSION  NAME:  Sara Walker   MRN: [id number] DOB:  1950-08-27   ADMIT DATE: 12/04/2019  INDICATIONS: Symptomatic atrial flutter  PROCEDURE:   Informed consent was obtained prior to the procedure. The risks, benefits and alternatives for the procedure were discussed and the patient comprehended these risks.  Risks include, but are not limited to, cough, sore throat, vomiting, nausea, somnolence, esophageal and stomach trauma or perforation, bleeding, low blood pressure, aspiration, pneumonia, infection, trauma to the teeth and death.    After a procedural time-out, the oropharynx was anesthetized and the patient was sedated by the anesthesia service. The transesophageal probe was inserted in the esophagus and stomach without difficulty and multiple views were obtained. Anesthesia was monitored by Cleta Alberts, CRNA. COMPLICATIONS:    Complications: No complications Patient tolerated procedure well.  FINDINGS: No LAA thrombus   CARDIOVERSION:     Indications:  Symptomatic Atrial Flutter  Procedure Details:  Once the TEE was complete, the patient had the defibrillator pads placed in the anterior and posterior position. Once an appropriate level of sedation was confirmed, the patient was cardioverted x 3 with initially 100J then 200Jx2. [employer] rep was present in the room and device was interrogated during the procedure.  With each cardioversion, she converted to an atrial paced, ventricular paced rhythm for approximately 5-10 seconds, then converted back into atrial flutter with ventricular pacing.  Overdrive pacing was also attempted but she remained in atrial flutter.  The patient had normal neuro status and respiratory status post procedure with vitals stable as recorded elsewhere.  Adequate airway was maintained throughout and vital signs monitored per protocol.  Oswaldo Milian MD [facility]    [address], [place], [place] [address] [phone]   1:14 PM

## 2019-11-28 NOTE — Progress Notes (Addendum)
[facility] Progress Note   69 y.o.femalecombined CHF NICM (chemotherapy) EF 25-30% s/p difibrillator, PAD on Eliquis, HTN HLD DM CKD4 w/ echo showing global hypokinesis. She was seen by renal in 09/2019 and started on Torsemide and metolazone. Recently she was seen by renal and Torsemide was increased to 100mg  daily and initiation of spironolactone 12.5mg . P/w  dyspnea, CP (tightness) , SOB w/ exertion, orthopnea and anorexia. Her BL cr is ~1.8-2.1and was seen earlier this month at [place] with Dr. Moshe Cipro w/ escalation of her torsemide dose to 100mg  daily and addition of aldactone as well. Since this admissionher crhas increased from 2.74 to 3.26.   Assessment/ Plan:   1. AKI on CKDIIIb - BL cr 1.8-2.1 with acute component secondary to cardiorenal syndrome complicated by the paroxysmal afib/flutter.   - Strict I&O's and fluid restriction to 1.2L  - Lasix 120 mg IV q 8hr and added metolazone 5mg  -> better response than prior day but still not great response (1350 ml / 24hr and only net 443 neg).  - Likely will need CRRT to UF for 3-5 days but she discussed with family and the pt herself does not want to do this. I did discuss this with her again today and she is very clear that this is not want she wants; main reason is she doesn't want to lay in bed for all those days and there is a  chance she may not come off RRT after the CRRT.   - She definitely does not want long term  RRT and regardless she would not tolerate it.  2. Hyponatremia of hypervolemic nature and secondary to cardiorenal - Will d/w cardiology; would like to give Tolvaptan but do not want to complicate any procedure or test  that pt is going for. 3. Afib/flutter - no  TEE/DCCV bec of left atrial appendage previously but she has been on Eliquis. Planned DCCV today and hopefully that will improve cardiac function. 4. Combined heart failure with an acute component as well. 5. NICM s/p CRT-D 6. HTN    Subjective:   Tired of being stuck for blood draws. She's close to the end of her line; weeping. Denies f/c.    Objective:   BP 106/63   Pulse (!) 102   Temp 97.6 F (36.4 C)   Resp 20   Ht 5\' 8"  (1.727 m)   Wt 115.8 kg   SpO2 94%   BMI 38.82 kg/m   Intake/Output Summary (Last 24 hours) at 12/09/2019 0918 Last data filed at 12/07/2019 0800 Gross per 24 hour  Intake 971.52 ml  Output 1350 ml  Net -378.48 ml   Weight change: 0.021 kg  Physical Exam: GEN: NAD, A&Ox3, NCAT HEENT: No conjunctival pallor, EOMI NECK: Supple, no thyromegaly LUNGS:  Rales at bases CV: tachy, harsh systolic murmur ABD: SNDNT +BS  EXT: 3+ lower extremity edema  Imaging: No results found.  Labs: BMET Recent Labs  Lab 11/19/2019 1331 11/26/19 0457 11/27/19 0429 11/18/2019 0232  NA 132* 132* 132* 127*  K 5.3* 5.2* 4.8 4.4  CL 93* 94* 94* 91*  CO2 21* 23 24 20*  GLUCOSE 153* 103* 97 194*  BUN 71* 78* 81* 83*  CREATININE 2.74* 3.26* 3.36* 3.47*  CALCIUM 9.7 10.1 10.2 9.5   CBC Recent Labs  Lab 12/04/2019 1331 11/26/19 0457 11/27/19 0429 11/27/2019 0232  WBC 6.7 7.1 6.0 7.5  HGB 12.9 12.1 12.1 12.4  HCT 41.0 38.7 38.8 39.7  MCV 87.0 85.1 85.3 85.7  PLT 291 315 270 321    Medications:    . amiodarone  200 mg Oral Daily  . apixaban  5 mg Oral BID  . Chlorhexidine Gluconate Cloth  6 each Topical Daily  . insulin glargine  34 Units Subcutaneous Q2200  . multivitamin with minerals  1 tablet Oral Daily  . sodium chloride flush  3 mL Intravenous Once  . sodium chloride flush  3 mL Intravenous Q12H      Otelia Santee, MD 11/27/2019, 9:18 AM

## 2019-11-28 NOTE — Progress Notes (Signed)
Per Endovascular department RN, Pt needs 2 IV sites to get procedure done. Discussed IV need with pt and clarified that procedure will not be done without 2 IVs present, especially with levophed infusing in current existing IV. Pt now agreeable to IV insertion. IV team informed.

## 2019-11-28 NOTE — Progress Notes (Addendum)
Progress Note  Patient Name: Sara Walker Date of Encounter: 12/06/2019  Primary Cardiologist: Sanda Klein, MD   Subjective   The patient is feeling slightly better she was able to diurese 1 L overnight.  Carvedilol was held because of hypotension.  Inpatient Medications    Scheduled Meds: . amiodarone  200 mg Oral Daily  . apixaban  5 mg Oral BID  . carvedilol  12.5 mg Oral BID  . Chlorhexidine Gluconate Cloth  6 each Topical Daily  . insulin glargine  34 Units Subcutaneous Q2200  . multivitamin with minerals  1 tablet Oral Daily  . sodium chloride flush  3 mL Intravenous Once  . sodium chloride flush  3 mL Intravenous Q12H   Continuous Infusions: . sodium chloride    . sodium chloride 10 mL/hr at 11/11/2019 0500  . sodium chloride Stopped (12/09/2019 0653)  . furosemide 62 mL/hr at 12/05/2019 0700  . norepinephrine (LEVOPHED) Adult infusion 2 mcg/min (11/11/2019 0700)   PRN Meds: sodium chloride, acetaminophen, ALPRAZolam, ondansetron (ZOFRAN) IV, sodium chloride flush, zolpidem   Vital Signs    Vitals:   11/17/2019 0400 12/07/2019 0500 11/27/2019 0700 11/14/2019 0736  BP: 100/60 107/84 (!) 96/59   Pulse:      Resp: 19 (!) 21 20   Temp:    97.6 F (36.4 C)  TempSrc:      SpO2: 90% 96% 93%   Weight:  115.8 kg    Height:        Intake/Output Summary (Last 24 hours) at 11/23/2019 0806 Last data filed at 11/10/2019 0700 Gross per 24 hour  Intake 906.71 ml  Output 1350 ml  Net -443.29 ml   Last 3 Weights 11/30/2019 11/27/2019 11/27/2019  Weight (lbs) 255 lb 4.7 oz 252 lb 10.4 oz 252 lb 9.6 oz  Weight (kg) 115.8 kg 114.6 kg 114.579 kg      Telemetry    Atrial fibrillation with ventricular rates in 90s- Personally Reviewed  ECG    No new tracing- Personally Reviewed  Physical Exam   GEN: No acute distress.  Sitting in chair Neck: No JVD while upright Cardiac: iRRR, no murmurs, rubs, or gallops.  Respiratory:  Crackles to auscultation bilaterally. GI: Soft,  nontender, distended  MS:  2+ bilateral; No deformity. Neuro:  Nonfocal  Psych: Normal affect   Labs    High Sensitivity Troponin:   Recent Labs  Lab 11/30/2019 1331 11/15/2019 1517  TROPONINIHS 43* 43*      Chemistry Recent Labs  Lab 11/26/2019 1331 11/30/2019 1331 11/26/19 0457 11/27/19 0429 11/25/2019 0232  NA 132*   < > 132* 132* 127*  K 5.3*   < > 5.2* 4.8 4.4  CL 93*   < > 94* 94* 91*  CO2 21*   < > 23 24 20*  GLUCOSE 153*   < > 103* 97 194*  BUN 71*   < > 78* 81* 83*  CREATININE 2.74*   < > 3.26* 3.36* 3.47*  CALCIUM 9.7   < > 10.1 10.2 9.5  PROT 5.7*  --   --   --   --   ALBUMIN 3.7  --   --   --   --   AST 21  --   --   --   --   ALT 36  --   --   --   --   ALKPHOS 74  --   --   --   --   BILITOT 3.1*  --   --   --   --  GFRNONAA 17*   < > 14* 13* 13*  GFRAA 20*   < > 16* 15* 15*  ANIONGAP 18*   < > 15 14 16*   < > = values in this interval not displayed.     Hematology Recent Labs  Lab 11/26/19 0457 11/27/19 0429 11/15/2019 0232  WBC 7.1 6.0 7.5  RBC 4.55 4.55 4.63  HGB 12.1 12.1 12.4  HCT 38.7 38.8 39.7  MCV 85.1 85.3 85.7  MCH 26.6 26.6 26.8  MCHC 31.3 31.2 31.2  RDW 17.5* 17.8* 17.9*  PLT 315 270 321   BNP Recent Labs  Lab 11/10/2019 1331  BNP 1,081.7*    DDimer No results for input(s): DDIMER in the last 168 hours.   Radiology    No results found. Cardiac Studies    Patient Profile     69 y.o. female with pmh of chronic combined CHF, NICM with EF 25-30% s/p CRT-D with no LVEF improvement, many meds intolerances including Entresto, PAF on Eliquis, ICD shocks for polymorphic VT in 2016 in the setting of hypokalemia, LBBB, HTN, HLD, IDDM2, CKD sage IV, h/o of uterine bleeding with IUD, anxiety and depression who is being seen for shortness of breath, orthopnea, PND, LE edema, increased abdominal girth. Scheduled PPM transmission today showed Aflutter that had been ongoing since early April, BiV pacing 78% of the time. Overall weight gain 5 lbs,  inability to sleep.   Admitted in 09/2019 - ac on chr CHF sec to loss of resynchronization pacing and being in atrial flutter with RVR. She was treated with IV lasix and kidney function worsened and nephrology was consulted who recommended Torsemide and metolazone. She had TEE 09/20/19 for DCCV but was unable to perform due to early forming thrombus in the left atrial appendage. Eliquis was started and she was loaded with amiodarone therapy. Discharge weight was 250lbs. Nephrology increased torsemide to 100 mg daily and spiro 12.5mg  daily started on 11/15/2019.  On physical exam she seems slightly short of breath in upright position, her lungs have minimal crackles, there is no significant murmur and regular rhythm, her lower extremity seems to have 2+ edema bilaterally.  In the ED BP 85/61, pulse 87, RR 24, afebrile, 100% O2, weight 252 lbs. Edema noted on exam. EKG showed Afib with BiV paced rhythm at 86 bpm. Labs show potassium 5.3, creatinine 2.74. WBC 6.7, Hgb 12.9, BNP 1,081. Hs trop 43>43. CXR showed persistent left retrocardiac atelectasis/consolidation.   Assessment & Plan    Acute on chronic combined systolic diastolic CHF, end-stage Chronic persistent atrial fibrillation/atrial flutter with RVR Nonischemic cardiomyopathy, LVEF 25 to 30% Status post CRT-D Acute on chronic kidney failure, CKD stage IV Cardiorenal syndrome  We restarted Lasix on admission with minimal diuresis, the patient is being followed by nephrology, they added Lasix 120 mg IV every 8 hours and added metolazone 5 mg daily with minimal diuresis overnight, necessity of potential CRRT was discussed "Likely will need CRRT to UF for 5 days but she discussed with family and the pt herself does not want to do this. There is a very good chance she may not come off RRT after the CRRT". The patient does not want to be a burden to her family and she is opposed to the idea. She is in cardiorenal syndrome, I started Levophed  yesterday at low-dose of 2 mcg/kg/min, she diuresed 1 L, she also had diarrhea overnight, I do not believe weights are accurate.  I will increase Levophed to 5  mcg/kg/min I am very hopeful that with TEE and cardioversion she might improve, this is scheduled for today.  She previously underwent TEE with planned cardioversion however she had a thrombus in her left atrial appendage. She has not missed any doses of Eliquis.   Ideally I would place a central line to monitor her Coox, however it is complicated in her case she had breast surgery including lymph nodes removal on the right side and ICD placement on the left.  She does not wish to have a PICC line placed as she had a traumatic experience yesterday with IV line placement.  We will hold off for now.    We will follow her creatinine and electrolytes closely.  Fattening today 3.47, potassium 4.4, sodium 127 - decreasing, we will consider tolvaptan..  We will involve EP, with atrial flutter she has lost biventricular pacing, she might also need a different management with regards to medication, will consider restarting amiodarone IV if she fails cardioversion.  For questions or updates, please contact [place] Please consult [email or web address] for contact info under     Signed, Ena Dawley, MD  11/20/2019, 8:06 AM

## 2019-11-28 NOTE — Progress Notes (Signed)
Echocardiogram Echocardiogram Transesophageal has been performed.  Oneal Deputy Kerrianne Jeng 11/30/2019, 1:08 PM

## 2019-11-28 NOTE — Progress Notes (Signed)
Dr. Meda Coffee at bedside to evaluate pt and discuss plan of care. Informed pt of need for 2 IVs for procedure. Pt very apprehensive and refuses to have IVs. States that she would rather have IV inserted while sedated. Educated that 2 IV sites needed for procedure and prior to sedation administration. Pt continues to refuse IV insertion.

## 2019-11-28 NOTE — Anesthesia Preprocedure Evaluation (Signed)
Anesthesia Evaluation  Patient identified by MRN, date of birth, ID band Patient awake    Reviewed: Allergy & Precautions, H&P , NPO status , Patient's Chart, lab work & pertinent test results  History of Anesthesia Complications (+) PONV and history of anesthetic complications  Airway Mallampati: II   Neck ROM: full    Dental   Pulmonary former smoker,    breath sounds clear to auscultation       Cardiovascular hypertension, +CHF  + dysrhythmias Atrial Fibrillation + pacemaker + Cardiac Defibrillator  Rhythm:regular Rate:Normal  NICM EF 25%.  On levophed gtt   Neuro/Psych PSYCHIATRIC DISORDERS Anxiety Depression  Neuromuscular disease    GI/Hepatic   Endo/Other  diabetes, Type 2  Renal/GU DialysisRenal disease     Musculoskeletal  (+) Arthritis ,   Abdominal   Peds  Hematology   Anesthesia Other Findings   Reproductive/Obstetrics                             Anesthesia Physical Anesthesia Plan  ASA: II  Anesthesia Plan: MAC   Post-op Pain Management:    Induction: Intravenous  PONV Risk Score and Plan: 3 and Propofol infusion and Treatment may vary due to age or medical condition  Airway Management Planned: Nasal Cannula  Additional Equipment:   Intra-op Plan:   Post-operative Plan:   Informed Consent: I have reviewed the patients History and Physical, chart, labs and discussed the procedure including the risks, benefits and alternatives for the proposed anesthesia with the patient or authorized representative who has indicated his/her understanding and acceptance.       Plan Discussed with: CRNA, Anesthesiologist and Surgeon  Anesthesia Plan Comments:         Anesthesia Quick Evaluation

## 2019-11-28 NOTE — Progress Notes (Signed)
Pt's blood sugar = 266. Refuses SSI coverage. States that she wants to take her own meds to manage her blood sugar. Pt educated about the importance of taking ordered insulin. Pt is  adamant about taking the sliding scale insulin. States "I am a brittle diabetic and the Lantus and the sliding scale will just bottom out my blood sugar." RPH notified and will adjust diabetes medication to match pt's home medicine.

## 2019-11-28 NOTE — Interval H&P Note (Signed)
History and Physical Interval Note:  11/21/2019 12:40 PM  Sara Walker  has presented today for surgery, with the diagnosis of afib.  The various methods of treatment have been discussed with the patient and family. After consideration of risks, benefits and other options for treatment, the patient has consented to  Procedure(s): TRANSESOPHAGEAL ECHOCARDIOGRAM (TEE) (N/A) CARDIOVERSION (N/A) as a surgical intervention.  The patient's history has been reviewed, patient examined, no change in status, stable for surgery.  I have reviewed the patient's chart and labs.  Questions were answered to the patient's satisfaction.     Donato Heinz

## 2019-11-29 DIAGNOSIS — K767 Hepatorenal syndrome: Secondary | ICD-10-CM

## 2019-11-29 DIAGNOSIS — E876 Hypokalemia: Secondary | ICD-10-CM

## 2019-11-29 LAB — HEMOGLOBIN A1C
Hgb A1c MFr Bld: 7.8 % — ABNORMAL HIGH (ref 4.8–5.6)
Mean Plasma Glucose: 177.16 mg/dL

## 2019-11-29 LAB — BASIC METABOLIC PANEL
Anion gap: 20 — ABNORMAL HIGH (ref 5–15)
BUN: 78 mg/dL — ABNORMAL HIGH (ref 8–23)
CO2: 21 mmol/L — ABNORMAL LOW (ref 22–32)
Calcium: 9.2 mg/dL (ref 8.9–10.3)
Chloride: 90 mmol/L — ABNORMAL LOW (ref 98–111)
Creatinine, Ser: 2.86 mg/dL — ABNORMAL HIGH (ref 0.44–1.00)
GFR calc Af Amer: 19 mL/min — ABNORMAL LOW (ref >60)
GFR calc non Af Amer: 16 mL/min — ABNORMAL LOW (ref >60)
Glucose, Bld: 178 mg/dL — ABNORMAL HIGH (ref 70–99)
Potassium: 2.9 mmol/L — ABNORMAL LOW (ref 3.5–5.1)
Sodium: 131 mmol/L — ABNORMAL LOW (ref 135–145)

## 2019-11-29 LAB — CBC
HCT: 40 % (ref 36.0–46.0)
Hemoglobin: 12.3 g/dL (ref 12.0–15.0)
MCH: 27.2 pg (ref 26.0–34.0)
MCHC: 30.8 g/dL (ref 30.0–36.0)
MCV: 88.3 fL (ref 80.0–100.0)
Platelets: 257 10*3/uL (ref 150–400)
RBC: 4.53 MIL/uL (ref 3.87–5.11)
RDW: 17.9 % — ABNORMAL HIGH (ref 11.5–15.5)
WBC: 5.9 10*3/uL (ref 4.0–10.5)
nRBC: 0.7 % — ABNORMAL HIGH (ref 0.0–0.2)

## 2019-11-29 LAB — GLUCOSE, CAPILLARY
Glucose-Capillary: 206 mg/dL — ABNORMAL HIGH (ref 70–99)
Glucose-Capillary: 269 mg/dL — ABNORMAL HIGH (ref 70–99)
Glucose-Capillary: 200 mg/dL — ABNORMAL HIGH (ref 70–99)
Glucose-Capillary: 158 mg/dL — ABNORMAL HIGH (ref 70–99)

## 2019-11-29 MED ORDER — FUROSEMIDE 10 MG/ML IJ SOLN
80.0000 mg | Freq: Three times a day (TID) | INTRAMUSCULAR | Status: DC
  Administered 2019-11-29 – 2019-11-30 (×4): 80 mg via INTRAVENOUS
  Filled 2019-11-29 (×5): qty 8

## 2019-11-29 MED ORDER — POTASSIUM CHLORIDE CRYS ER 20 MEQ PO TBCR
40.0000 meq | EXTENDED_RELEASE_TABLET | Freq: Two times a day (BID) | ORAL | Status: DC
  Administered 2019-11-29 (×2): 40 meq via ORAL
  Filled 2019-11-29 (×2): qty 2

## 2019-11-29 MED ORDER — DIPHENHYDRAMINE HCL 25 MG PO CAPS
25.0000 mg | ORAL_CAPSULE | Freq: Four times a day (QID) | ORAL | Status: DC | PRN
  Administered 2019-11-29 – 2019-11-30 (×4): 25 mg via ORAL
  Filled 2019-11-29 (×4): qty 1

## 2019-11-29 MED ORDER — HYDROCORTISONE 1 % EX CREA
TOPICAL_CREAM | CUTANEOUS | Status: DC | PRN
  Administered 2019-11-29: 1 via TOPICAL
  Filled 2019-11-29: qty 28.35

## 2019-11-29 NOTE — Progress Notes (Signed)
[facility] Progress Note   68 y.o.femalecombined CHF NICM (chemotherapy) EF 25-30% s/p difibrillator, PAD on Eliquis, HTN HLD DM CKD4 w/ echo showing global hypokinesis. She was seen by renal in 09/2019 and started on Torsemide and metolazone. Recently she was seen by renal and Torsemide was increased to 100mg  daily and initiation of spironolactone 12.5mg .P/w dyspnea, CP (tightness) , SOB w/ exertion, orthopnea and anorexia. Her BL cr is ~1.8-2.1and was seen earlier this month at [place] with Dr. Moshe Cipro w/ escalation of her torsemide dose to 100mg  daily and addition of aldactone as well. Since this admissionher crhas increased from 2.74 to 3.26.  Assessment/ Plan:   1. AKI on CKDIIIb - BL cr 1.8-2.1 with acute component secondary to cardiorenal syndrome complicated by the paroxysmal afib/flutter.   - Strict I&O's and fluid restriction to 1.2L  - Will convert the Lasix 120 mg IV q 8hr -> 80mg  IV q8hr; I wonder if the markedly improved UOP is bec of the higher HR after PM was adjusted (currently rate is 90) + the Levophed.   -  (1350 ml / 4650 48hr and net neg  443 and 3047)  - She definitely does not want long termRRT and regardless she would not tolerate it.  2. Hyponatremia of hypervolemic nature and secondary to cardiorenal - Fortunately Na improving with the improved perfusion. 3. Afib/flutter - no  TEE/DCCV bec of left atrial appendage previously but she has been on Eliquis. Failed  DCCV x3 on 5/20. 4. Combined heart failure with an acute component as well. 5. NICM s/p CRT-D 6. HTN  Subjective:   Tired of being stuck for blood draws; she knows she's a tough stick. Pruritus diffusely worse in the back.   Denies f/c; feels that the breathing is better.   Objective:   BP 105/67   Pulse 80   Temp (!) 97.3 F (36.3 C) (Oral)   Resp 19   Ht 5\' 8"  (1.727 m)   Wt 114.8 kg   SpO2 90%   BMI 38.48 kg/m   Intake/Output Summary (Last 24 hours) at  11/29/2019 9983 Last data filed at 11/29/2019 3825 Gross per 24 hour  Intake 1537.96 ml  Output 4950 ml  Net -3412.04 ml   Weight change: 0.2 kg  Physical Exam: GEN: NAD, A&Ox3, NCAT HEENT: No conjunctival pallor, EOMI NECK: Supple, no thyromegaly LUNGS: Rales at bases KN:[id number], harsh systolic murmur ABD: SNDNT +BS  EXT:3+lower extremity edema  Imaging: ECHO TEE  Result Date: 11/19/2019    TRANSESOPHOGEAL ECHO REPORT   Patient Name:   Sara Walker Date of Exam: 12/09/2019 Medical Rec #:  [id number]      Height:       68.0 in Accession #:    [id number]     Weight:       255.3 lb Date of Birth:  1950/12/02      BSA:          2.267 m Patient Age:    69 years       BP:           119/73 mmHg Patient Gender: F              HR:           77 bpm. Exam Location:  Inpatient Procedure: Transesophageal Echo, Color Doppler and Cardiac Doppler Indications:     I48.91* Unspecified atrial fibrillation  History:         Patient has prior history of Echocardiogram examinations,  most                  recent 09/20/2019. CHF, Pacemaker and Defibrillator,                  Arrythmias:Atrial Fibrillation and LBBB; Risk                  Factors:Hypertension, Diabetes and Dyslipidemia.  Sonographer:     Raquel Sarna Senior RDCS Referring Phys:  [id number] [place] Diagnosing Phys: Oswaldo Milian MD PROCEDURE: After discussion of the risks and benefits of a TEE, an informed consent was obtained from the patient. The transesophogeal probe was passed without difficulty through the esophogus of the patient. Local oropharyngeal anesthetic was provided with Cetacaine. Sedation performed by different physician. The patient was monitored while under deep sedation. Anesthestetic sedation was provided intravenously by Anesthesiology: 161mg  of Propofol. The patient developed no complications during the procedure. An unsuccessful direct current cardioversion was performed with 3 attempts. IMPRESSIONS  1. Left ventricular  ejection fraction, by estimation, is 25 to 30%. The left ventricle has severely decreased function. The left ventricle demonstrates global hypokinesis. The left ventricular internal cavity size was severely dilated. There is mild left ventricular hypertrophy.  2. Right ventricular systolic function is mildly reduced. The right ventricular size is mildly enlarged.  3. Right atrial size was mildly dilated.  4. The mitral valve is abnormal. Moderate to severe mitral valve regurgitation.  5. The tricuspid valve is abnormal. Tricuspid valve regurgitation is moderate to severe.  6. The aortic valve is tricuspid. Aortic valve regurgitation is not visualized. No aortic stenosis is present.  7. Evidence of atrial level shunting detected by color flow Doppler.  8. Left atrial size was severely dilated. No left atrial/left atrial appendage thrombus was detected. FINDINGS  Left Ventricle: Left ventricular ejection fraction, by estimation, is 25 to 30%. The left ventricle has severely decreased function. The left ventricle demonstrates global hypokinesis. The left ventricular internal cavity size was severely dilated. There is mild left ventricular hypertrophy. Right Ventricle: The right ventricular size is mildly enlarged. Right vetricular wall thickness was not assessed. Right ventricular systolic function is mildly reduced. Left Atrium: Left atrial size was severely dilated. No left atrial/left atrial appendage thrombus was detected. Right Atrium: Right atrial size was mildly dilated. Pericardium: Trivial pericardial effusion is present. Mitral Valve: The mitral valve is abnormal. Moderate to severe mitral valve regurgitation. Tricuspid Valve: The tricuspid valve is abnormal. Tricuspid valve regurgitation is moderate to severe. Aortic Valve: The aortic valve is tricuspid. Aortic valve regurgitation is not visualized. No aortic stenosis is present. Pulmonic Valve: The pulmonic valve was grossly normal. Pulmonic valve  regurgitation is trivial. Aorta: The aortic root is normal in size and structure. IAS/Shunts: Evidence of atrial level shunting detected by color flow Doppler.  MR Peak grad:    95.6 mmHg MR Mean grad:    50.0 mmHg MR Vmax:         489.00 cm/s MR Vmean:        324.0 cm/s MR PISA:         2.26 cm MR PISA Eff ROA: 18 mm MR PISA Radius:  0.60 cm Oswaldo Milian MD Electronically signed by Oswaldo Milian MD Signature Date/Time: 12/08/2019/3:11:02 PM    Final     Labs: BMET Recent Labs  Lab 12/03/2019 1331 11/26/19 0457 11/27/19 0429 12/03/2019 0232 11/29/19 0541  NA 132* 132* 132* 127* 131*  K 5.3* 5.2* 4.8 4.4 2.9*  CL 93*  94* 94* 91* 90*  CO2 21* 23 24 20* 21*  GLUCOSE 153* 103* 97 194* 178*  BUN 71* 78* 81* 83* 78*  CREATININE 2.74* 3.26* 3.36* 3.47* 2.86*  CALCIUM 9.7 10.1 10.2 9.5 9.2   CBC Recent Labs  Lab 11/26/19 0457 11/27/19 0429 11/29/2019 0232 11/29/19 0541  WBC 7.1 6.0 7.5 5.9  HGB 12.1 12.1 12.4 12.3  HCT 38.7 38.8 39.7 40.0  MCV 85.1 85.3 85.7 88.3  PLT 315 270 321 257    Medications:    . amiodarone  200 mg Oral Daily  . apixaban  5 mg Oral BID  . Chlorhexidine Gluconate Cloth  6 each Topical Daily  . insulin aspart  0-9 Units Subcutaneous TID WC  . insulin degludec  34 Units Subcutaneous Q2000  . multivitamin with minerals  1 tablet Oral Daily  . sodium chloride flush  3 mL Intravenous Once  . sodium chloride flush  3 mL Intravenous Q12H      Otelia Santee, MD 11/29/2019, 8:58 AM

## 2019-11-29 NOTE — Progress Notes (Signed)
Chaplain engaged in initial visit with Sara Walker. During visit, [name] expressed that she no longer wants to endure any pain or suffering.  She detailed the different health challenges she has been through over the last 30 years including cancer.  Her weariness of being stuck by needles for blood comes from the memories and trauma of having cancer.  She also stated that many people think that she is depressed but she declared that she is not.  [name] stated that she cries a lot but that she is not depressed.  [name] pinpointed pain as her main reasons for not wanting to undergo certain treatments.  She was clear about not wanting to be on dialysis because of the quality of life she would essentially have.  [name] also stated that she has been able to have these conversations with her children.  They understand her want to not prolong her life.  [name] conveyed that she is not afraid of death.  She also stated that her children or grandchildren would essentially be ok without her and that they did not need her.   Chaplain assesses that [name] has clear ideas about what she desires and that there may be a disconnect with her wants and needs versus the medical staff.  [name] seems to be conveying that she wants to be made comfortable and enjoy whatever time she has left without being in a significant amount of pain.  Chaplain offered the ministries of presence and listening.  Chaplain will follow-up.

## 2019-11-29 NOTE — Plan of Care (Signed)
  Problem: Clinical Measurements: Goal: Diagnostic test results will improve Outcome: Progressing Goal: Cardiovascular complication will be avoided Outcome: Progressing   Problem: Nutrition: Goal: Adequate nutrition will be maintained Outcome: Progressing   Problem: Elimination: Goal: Will not experience complications related to urinary retention Outcome: Progressing   Problem: Pain Managment: Goal: General experience of comfort will improve Outcome: Progressing

## 2019-11-29 NOTE — Progress Notes (Signed)
Progress Note  Patient Name: Sara Walker Date of Encounter: 11/29/2019  Primary Cardiologist: Sanda Klein, MD   Subjective   The patient states that shortness of breath might be slightly better however she feels exhausted, was not able to sleep and has significant pruritus today.  Inpatient Medications    Scheduled Meds: . amiodarone  200 mg Oral Daily  . apixaban  5 mg Oral BID  . Chlorhexidine Gluconate Cloth  6 each Topical Daily  . insulin aspart  0-9 Units Subcutaneous TID WC  . insulin degludec  34 Units Subcutaneous Q2000  . multivitamin with minerals  1 tablet Oral Daily  . sodium chloride flush  3 mL Intravenous Once  . sodium chloride flush  3 mL Intravenous Q12H   Continuous Infusions: . sodium chloride    . sodium chloride 100 mL/hr at 11/30/2019 1310  . amiodarone 30 mg/hr (11/29/19 0700)  . furosemide 62 mL/hr at 11/29/19 0700  . norepinephrine (LEVOPHED) Adult infusion 5 mcg/min (11/29/19 0700)   PRN Meds: sodium chloride, acetaminophen, ALPRAZolam, ondansetron (ZOFRAN) IV, sodium chloride flush, zolpidem   Vital Signs    Vitals:   11/29/19 0400 11/29/19 0500 11/29/19 0600 11/29/19 0700  BP:   104/66 91/69  Pulse:      Resp: (!) 27 19 17 17   Temp:   (!) 97.3 F (36.3 C)   TempSrc:   Oral   SpO2: 95% 97% 95% 93%  Weight:  114.8 kg    Height:        Intake/Output Summary (Last 24 hours) at 11/29/2019 0810 Last data filed at 11/29/2019 0700 Gross per 24 hour  Intake 1537.96 ml  Output 4650 ml  Net -3112.04 ml   Last 3 Weights 11/29/2019 12/03/2019 11/27/2019  Weight (lbs) 253 lb 1.4 oz 255 lb 4.7 oz 252 lb 10.4 oz  Weight (kg) 114.8 kg 115.8 kg 114.6 kg      Telemetry    Atrial fibrillation with ventricular rates in 90s- Personally Reviewed  ECG    No new tracing- Personally Reviewed  Physical Exam   GEN: No acute distress.  Sitting in chair Neck: No JVD while upright Cardiac: iRRR, no murmurs, rubs, or gallops.  Respiratory:   Crackles to auscultation bilaterally. GI: Soft, nontender, distended  MS:  2+ bilateral; No deformity. Neuro:  Nonfocal  Psych: Normal affect   Labs    High Sensitivity Troponin:   Recent Labs  Lab 11/16/2019 1331 11/22/2019 1517  TROPONINIHS 43* 43*      Chemistry Recent Labs  Lab 11/30/2019 1331 11/26/19 0457 11/27/19 0429 11/17/2019 0232 11/29/19 0541  NA 132*   < > 132* 127* 131*  K 5.3*   < > 4.8 4.4 2.9*  CL 93*   < > 94* 91* 90*  CO2 21*   < > 24 20* 21*  GLUCOSE 153*   < > 97 194* 178*  BUN 71*   < > 81* 83* 78*  CREATININE 2.74*   < > 3.36* 3.47* 2.86*  CALCIUM 9.7   < > 10.2 9.5 9.2  PROT 5.7*  --   --   --   --   ALBUMIN 3.7  --   --   --   --   AST 21  --   --   --   --   ALT 36  --   --   --   --   ALKPHOS 74  --   --   --   --  BILITOT 3.1*  --   --   --   --   GFRNONAA 17*   < > 13* 13* 16*  GFRAA 20*   < > 15* 15* 19*  ANIONGAP 18*   < > 14 16* 20*   < > = values in this interval not displayed.     Hematology Recent Labs  Lab 11/27/19 0429 11/19/2019 0232 11/29/19 0541  WBC 6.0 7.5 5.9  RBC 4.55 4.63 4.53  HGB 12.1 12.4 12.3  HCT 38.8 39.7 40.0  MCV 85.3 85.7 88.3  MCH 26.6 26.8 27.2  MCHC 31.2 31.2 30.8  RDW 17.8* 17.9* 17.9*  PLT 270 321 257   BNP Recent Labs  Lab 11/09/2019 1331  BNP 1,081.7*    DDimer No results for input(s): DDIMER in the last 168 hours.   Radiology    ECHO TEE  Result Date: 11/11/2019    TRANSESOPHOGEAL ECHO REPORT   Patient Name:   Sara Walker Date of Exam: 11/24/2019 Medical Rec #:  [id number]      Height:       68.0 in Accession #:    [id number]     Weight:       255.3 lb Date of Birth:  [birth year]      BSA:          2.267 m Patient Age:    69 years       BP:           119/73 mmHg Patient Gender: F              HR:           77 bpm. Exam Location:  Inpatient Procedure: Transesophageal Echo, Color Doppler and Cardiac Doppler Indications:     I48.91* Unspecified atrial fibrillation  History:         Patient has  prior history of Echocardiogram examinations, most                  recent 09/20/2019. CHF, Pacemaker and Defibrillator,                  Arrythmias:Atrial Fibrillation and LBBB; Risk                  Factors:Hypertension, Diabetes and Dyslipidemia.  Sonographer:     Raquel Sarna Senior RDCS Referring Phys:  [id number] [place] Diagnosing Phys: Oswaldo Milian MD PROCEDURE: After discussion of the risks and benefits of a TEE, an informed consent was obtained from the patient. The transesophogeal probe was passed without difficulty through the esophogus of the patient. Local oropharyngeal anesthetic was provided with Cetacaine. Sedation performed by different physician. The patient was monitored while under deep sedation. Anesthestetic sedation was provided intravenously by Anesthesiology: 161mg  of Propofol. The patient developed no complications during the procedure. An unsuccessful direct current cardioversion was performed with 3 attempts. IMPRESSIONS  1. Left ventricular ejection fraction, by estimation, is 25 to 30%. The left ventricle has severely decreased function. The left ventricle demonstrates global hypokinesis. The left ventricular internal cavity size was severely dilated. There is mild left ventricular hypertrophy.  2. Right ventricular systolic function is mildly reduced. The right ventricular size is mildly enlarged.  3. Right atrial size was mildly dilated.  4. The mitral valve is abnormal. Moderate to severe mitral valve regurgitation.  5. The tricuspid valve is abnormal. Tricuspid valve regurgitation is moderate to severe.  6. The aortic valve is tricuspid. Aortic valve regurgitation is not visualized. No  aortic stenosis is present.  7. Evidence of atrial level shunting detected by color flow Doppler.  8. Left atrial size was severely dilated. No left atrial/left atrial appendage thrombus was detected. FINDINGS  Left Ventricle: Left ventricular ejection fraction, by estimation, is 25 to 30%. The  left ventricle has severely decreased function. The left ventricle demonstrates global hypokinesis. The left ventricular internal cavity size was severely dilated. There is mild left ventricular hypertrophy. Right Ventricle: The right ventricular size is mildly enlarged. Right vetricular wall thickness was not assessed. Right ventricular systolic function is mildly reduced. Left Atrium: Left atrial size was severely dilated. No left atrial/left atrial appendage thrombus was detected. Right Atrium: Right atrial size was mildly dilated. Pericardium: Trivial pericardial effusion is present. Mitral Valve: The mitral valve is abnormal. Moderate to severe mitral valve regurgitation. Tricuspid Valve: The tricuspid valve is abnormal. Tricuspid valve regurgitation is moderate to severe. Aortic Valve: The aortic valve is tricuspid. Aortic valve regurgitation is not visualized. No aortic stenosis is present. Pulmonic Valve: The pulmonic valve was grossly normal. Pulmonic valve regurgitation is trivial. Aorta: The aortic root is normal in size and structure. IAS/Shunts: Evidence of atrial level shunting detected by color flow Doppler.  MR Peak grad:    95.6 mmHg MR Mean grad:    50.0 mmHg MR Vmax:         489.00 cm/s MR Vmean:        324.0 cm/s MR PISA:         2.26 cm MR PISA Eff ROA: 18 mm MR PISA Radius:  0.60 cm Oswaldo Milian MD Electronically signed by Oswaldo Milian MD Signature Date/Time: 11/12/2019/3:11:02 PM    Final    Cardiac Studies    Patient Profile     69 y.o. female with pmh of chronic combined CHF, NICM with EF 25-30% s/p CRT-D with no LVEF improvement, many meds intolerances including Entresto, PAF on Eliquis, ICD shocks for polymorphic VT in 2016 in the setting of hypokalemia, LBBB, HTN, HLD, IDDM2, CKD sage IV, h/o of uterine bleeding with IUD, anxiety and depression who is being seen for shortness of breath, orthopnea, PND, LE edema, increased abdominal girth. Scheduled PPM transmission  today showed Aflutter that had been ongoing since early April, BiV pacing 78% of the time. Overall weight gain 5 lbs, inability to sleep.   Admitted in 09/2019 - ac on chr CHF sec to loss of resynchronization pacing and being in atrial flutter with RVR. She was treated with IV lasix and kidney function worsened and nephrology was consulted who recommended Torsemide and metolazone. She had TEE 09/20/19 for DCCV but was unable to perform due to early forming thrombus in the left atrial appendage. Eliquis was started and she was loaded with amiodarone therapy. Discharge weight was 250lbs. Nephrology increased torsemide to 100 mg daily and spiro 12.5mg  daily started on 11/15/2019.  On physical exam she seems slightly short of breath in upright position, her lungs have minimal crackles, there is no significant murmur and regular rhythm, her lower extremity seems to have 2+ edema bilaterally.  In the ED BP 85/61, pulse 87, RR 24, afebrile, 100% O2, weight 252 lbs. Edema noted on exam. EKG showed Afib with BiV paced rhythm at 86 bpm. Labs show potassium 5.3, creatinine 2.74. WBC 6.7, Hgb 12.9, BNP 1,081. Hs trop 43>43. CXR showed persistent left retrocardiac atelectasis/consolidation.   Assessment & Plan    Acute on chronic combined systolic diastolic CHF, end-stage Chronic persistent atrial fibrillation/atrial flutter with RVR  Nonischemic cardiomyopathy, LVEF 25 to 30% Status post CRT-D Acute on chronic kidney failure, CKD stage IV Cardiorenal syndrome  We restarted Lasix on admission with minimal diuresis, she did not respond and was almost anuric for about 3 days when nephrology got involved, they added Lasix 120 mg IV every 8 hours and added metolazone 5 mg daily with minimal diuresis overnight, necessity of potential CRRT was discussed "Likely will need CRRT to UF for 5 days but she discussed with family and the pt herself does not want to do this. There is a very good chance she may not come off RRT  after the CRRT". The patient does not want to be a burden to her family and she is opposed to the idea. She is in cardiorenal syndrome, we started Levophed yesterday at low-dose of 2 mcg/kg/min on 11/27/2019, she diuresed 1 L, increased to 5 mcg/kg/min and she had great diuresis of 3.1 L overnight.  She failed cardioversion yesterday.  We appreciate help of our EP colleagues will try to over pace her without success, Dr. Caryl Comes thinks that she might be in atrial fibrillation instead of atrial flutter, they interrogated her BiV ICD and set up pacing at 56 so she can benefit for biventricular pacing. This is probably would help with her the most, we also restarted amiodarone drip.  We will keep her in ICU continue Levophed at current rate.  Her creatinine improved from 2.47 -> 2.86.  She was hyponatremic and we consider tolvaptan however it is now improving today 131.  Her potassium is low I will replace.  Ideally I would place a central line to monitor her Coox, however it is complicated in her case she had breast surgery including lymph nodes removal on the right side and ICD placement on the left.  She does not wish to have a PICC line placed as she had a traumatic experience yesterday with IV line placement.  We will hold off for now.    I will start low-dose Benadryl 25 mg as she has significant itching this might help her with sleep as well.  For questions or updates, please contact [place] Please consult [email or web address] for contact info under     Signed, Ena Dawley, MD  11/29/2019, 8:10 AM

## 2019-11-30 DIAGNOSIS — E871 Hypo-osmolality and hyponatremia: Secondary | ICD-10-CM

## 2019-11-30 DIAGNOSIS — I130 Hypertensive heart and chronic kidney disease with heart failure and stage 1 through stage 4 chronic kidney disease, or unspecified chronic kidney disease: Principal | ICD-10-CM

## 2019-11-30 DIAGNOSIS — R570 Cardiogenic shock: Secondary | ICD-10-CM

## 2019-11-30 LAB — BASIC METABOLIC PANEL
Anion gap: 17 — ABNORMAL HIGH (ref 5–15)
Anion gap: 19 — ABNORMAL HIGH (ref 5–15)
Anion gap: 13 (ref 5–15)
BUN: 66 mg/dL — ABNORMAL HIGH (ref 8–23)
BUN: 64 mg/dL — ABNORMAL HIGH (ref 8–23)
BUN: 62 mg/dL — ABNORMAL HIGH (ref 8–23)
CO2: 29 mmol/L (ref 22–32)
CO2: 30 mmol/L (ref 22–32)
CO2: 32 mmol/L (ref 22–32)
Calcium: 9.6 mg/dL (ref 8.9–10.3)
Calcium: 9.7 mg/dL (ref 8.9–10.3)
Calcium: 9.5 mg/dL (ref 8.9–10.3)
Chloride: 87 mmol/L — ABNORMAL LOW (ref 98–111)
Chloride: 84 mmol/L — ABNORMAL LOW (ref 98–111)
Chloride: 86 mmol/L — ABNORMAL LOW (ref 98–111)
Creatinine, Ser: 2.07 mg/dL — ABNORMAL HIGH (ref 0.44–1.00)
Creatinine, Ser: 2.17 mg/dL — ABNORMAL HIGH (ref 0.44–1.00)
Creatinine, Ser: 1.99 mg/dL — ABNORMAL HIGH (ref 0.44–1.00)
GFR calc Af Amer: 28 mL/min — ABNORMAL LOW (ref >60)
GFR calc Af Amer: 26 mL/min — ABNORMAL LOW (ref >60)
GFR calc Af Amer: 29 mL/min — ABNORMAL LOW (ref >60)
GFR calc non Af Amer: 24 mL/min — ABNORMAL LOW (ref >60)
GFR calc non Af Amer: 23 mL/min — ABNORMAL LOW (ref >60)
GFR calc non Af Amer: 25 mL/min — ABNORMAL LOW (ref >60)
Glucose, Bld: 111 mg/dL — ABNORMAL HIGH (ref 70–99)
Glucose, Bld: 187 mg/dL — ABNORMAL HIGH (ref 70–99)
Glucose, Bld: 221 mg/dL — ABNORMAL HIGH (ref 70–99)
Potassium: 2.6 mmol/L — CL (ref 3.5–5.1)
Potassium: 2.8 mmol/L — ABNORMAL LOW (ref 3.5–5.1)
Potassium: 3.3 mmol/L — ABNORMAL LOW (ref 3.5–5.1)
Sodium: 133 mmol/L — ABNORMAL LOW (ref 135–145)
Sodium: 133 mmol/L — ABNORMAL LOW (ref 135–145)
Sodium: 131 mmol/L — ABNORMAL LOW (ref 135–145)

## 2019-11-30 LAB — GLUCOSE, CAPILLARY
Glucose-Capillary: 132 mg/dL — ABNORMAL HIGH (ref 70–99)
Glucose-Capillary: 238 mg/dL — ABNORMAL HIGH (ref 70–99)
Glucose-Capillary: 202 mg/dL — ABNORMAL HIGH (ref 70–99)
Glucose-Capillary: 186 mg/dL — ABNORMAL HIGH (ref 70–99)

## 2019-11-30 LAB — CBC
HCT: 39.8 % (ref 36.0–46.0)
Hemoglobin: 12.5 g/dL (ref 12.0–15.0)
MCH: 26.7 pg (ref 26.0–34.0)
MCHC: 31.4 g/dL (ref 30.0–36.0)
MCV: 85 fL (ref 80.0–100.0)
Platelets: 268 10*3/uL (ref 150–400)
RBC: 4.68 MIL/uL (ref 3.87–5.11)
RDW: 17.8 % — ABNORMAL HIGH (ref 11.5–15.5)
WBC: 6.6 10*3/uL (ref 4.0–10.5)
nRBC: 0.3 % — ABNORMAL HIGH (ref 0.0–0.2)

## 2019-11-30 LAB — MAGNESIUM: Magnesium: 1.7 mg/dL (ref 1.7–2.4)

## 2019-11-30 MED ORDER — POLYETHYLENE GLYCOL 3350 17 G PO PACK
17.0000 g | PACK | Freq: Every day | ORAL | Status: DC | PRN
  Administered 2019-11-30: 17 g via ORAL
  Filled 2019-11-30: qty 1

## 2019-11-30 MED ORDER — MAGNESIUM SULFATE 2 GM/50ML IV SOLN
2.0000 g | Freq: Once | INTRAVENOUS | Status: AC
  Administered 2019-11-30: 2 g via INTRAVENOUS
  Filled 2019-11-30: qty 50

## 2019-11-30 MED ORDER — PROMETHAZINE HCL 25 MG PO TABS: 12.5000 mg | ORAL_TABLET | Freq: Once | ORAL | Status: AC | PRN

## 2019-11-30 MED ORDER — POTASSIUM CHLORIDE CRYS ER 20 MEQ PO TBCR
40.0000 meq | EXTENDED_RELEASE_TABLET | Freq: Once | ORAL | Status: AC
  Administered 2019-11-30: 40 meq via ORAL
  Filled 2019-11-30: qty 2

## 2019-11-30 MED ORDER — PROMETHAZINE HCL 25 MG/ML IJ SOLN
6.2500 mg | INTRAMUSCULAR | Status: AC | PRN
  Administered 2019-11-30: 6.25 mg via INTRAVENOUS
  Filled 2019-11-30: qty 1

## 2019-11-30 MED ORDER — POTASSIUM CHLORIDE CRYS ER 20 MEQ PO TBCR
40.0000 meq | EXTENDED_RELEASE_TABLET | Freq: Three times a day (TID) | ORAL | Status: DC
  Administered 2019-11-30 (×4): 40 meq via ORAL
  Filled 2019-11-30 (×5): qty 2

## 2019-11-30 MED ORDER — POTASSIUM CHLORIDE CRYS ER 20 MEQ PO TBCR
40.0000 meq | EXTENDED_RELEASE_TABLET | Freq: Once | ORAL | Status: AC
  Administered 2019-11-30: 40 meq via ORAL

## 2019-11-30 MED ORDER — POTASSIUM CHLORIDE 10 MEQ/100ML IV SOLN: 10.0000 meq | Freq: Once | INTRAVENOUS | Status: DC

## 2019-11-30 MED ORDER — POTASSIUM CHLORIDE CRYS ER 20 MEQ PO TBCR
40.0000 meq | EXTENDED_RELEASE_TABLET | Freq: Two times a day (BID) | ORAL | Status: DC
  Administered 2019-11-30 (×2): 40 meq via ORAL
  Filled 2019-11-30 (×2): qty 2

## 2019-11-30 MED ORDER — PROMETHAZINE HCL 12.5 MG RE SUPP
12.5000 mg | RECTAL | Status: AC | PRN
  Filled 2019-11-30: qty 1

## 2019-11-30 NOTE — Progress Notes (Signed)
[name] Progress Note   69 y.o.femalecombined CHF NICM (chemotherapy) EF 25-30% s/p difibrillator, PAD on Eliquis, HTN HLD DM CKD4 w/ echo showing global hypokinesis. She was seen by renal in 09/2019 and started on Torsemide and metolazone. Recently she was seen by renal and Torsemide was increased to 100mg  daily and initiation of spironolactone 12.5mg .P/w dyspnea, CP (tightness) , SOB w/ exertion, orthopnea and anorexia. Her BL cr is ~1.8-2.1and was seen earlier this month at [facility] with Dr. Moshe Cipro w/ escalation of her torsemide dose to 100mg  daily and addition of aldactone as well. Since this admissionher crhas increased from 2.74 to 3.26.  Assessment/ Plan:   1. AKI on CKDIIIb - BL cr 1.8-2.1 with acute component secondary to cardiorenal syndrome complicated by the paroxysmal afib/flutter.   - Strict I&O's and fluid restriction to 1.2L  - Converted the Lasix 120 mg IV q 8hr -> 80mg  IV q8hr on 5/21; markedly improved UOP is bec of the higher HR after PM was adjusted (currently rate is 90) + the Levophed.   -  (1350 ml / 4650/ 2750 72hr and net neg  443/ 2440/ 1027)  - Still has significant amount of fluid onboard; concern is even if we are able to get her more "euvolemic" she eventually and often times in a matter of weeks, stops responding to the oral diuretics at home. For now continue high dose Lasix.  - She definitely does not want long termRRTand regardless shewould not tolerate it.   2. Hyponatremia of hypervolemic nature and secondary to cardiorenal - Fortunately Na improving with the improved perfusion. 3. Afib/flutter -noTEE/DCCV bec of left atrial appendagepreviously but she has been on Eliquis. Failed  DCCV x3 on 5/20. 4. Combined heart failure with an acute component as well. 5. NICM s/p CRT-D 6. HTN  Subjective:   Tired of being stuck for blood draws; she knows she's a tough stick. Pruritus diffusely worse in the back.    Denies f/c; feels that the breathing is better.   Objective:   BP 95/62   Pulse 80   Temp 97.9 F (36.6 C) (Oral)   Resp (!) 21   Ht 5\' 8"  (1.727 m)   Wt 112.4 kg   SpO2 (!) 89%   BMI 37.68 kg/m   Intake/Output Summary (Last 24 hours) at 11/30/2019 1020 Last data filed at 11/30/2019 0900 Gross per 24 hour  Intake 1279.8 ml  Output 2300 ml  Net -1020.2 ml   Weight change: -2.4 kg  Physical Exam: GEN: NAD, A&Ox3, NCAT HEENT: No conjunctival pallor, EOMI NECK: Supple, no thyromegaly LUNGS: Rales at bases OZ:DGUYQ, harsh systolic murmur ABD: SNDNT +BS  EXT:3+lower extremity edema  Imaging: ECHO TEE  Result Date: 11/30/2019    TRANSESOPHOGEAL ECHO REPORT   Patient Name:   Sara Walker Date of Exam: 12/05/2019 Medical Rec #:  [id number]      Height:       68.0 in Accession #:    [id number]     Weight:       255.3 lb Date of Birth:  11-22-1950      BSA:          2.267 m Patient Age:    37 years       BP:           119/73 mmHg Patient Gender: F              HR:           77  bpm. Exam Location:  Inpatient Procedure: Transesophageal Echo, Color Doppler and Cardiac Doppler Indications:     I48.91* Unspecified atrial fibrillation  History:         Patient has prior history of Echocardiogram examinations, most                  recent 09/20/2019. CHF, Pacemaker and Defibrillator,                  Arrythmias:Atrial Fibrillation and LBBB; Risk                  Factors:Hypertension, Diabetes and Dyslipidemia.  Sonographer:     Raquel Sarna Senior RDCS Referring Phys:  [id number] [place] Diagnosing Phys: Oswaldo Milian MD PROCEDURE: After discussion of the risks and benefits of a TEE, an informed consent was obtained from the patient. The transesophogeal probe was passed without difficulty through the esophogus of the patient. Local oropharyngeal anesthetic was provided with Cetacaine. Sedation performed by different physician. The patient was monitored while under deep sedation.  Anesthestetic sedation was provided intravenously by Anesthesiology: 161mg  of Propofol. The patient developed no complications during the procedure. An unsuccessful direct current cardioversion was performed with 3 attempts. IMPRESSIONS  1. Left ventricular ejection fraction, by estimation, is 25 to 30%. The left ventricle has severely decreased function. The left ventricle demonstrates global hypokinesis. The left ventricular internal cavity size was severely dilated. There is mild left ventricular hypertrophy.  2. Right ventricular systolic function is mildly reduced. The right ventricular size is mildly enlarged.  3. Right atrial size was mildly dilated.  4. The mitral valve is abnormal. Moderate to severe mitral valve regurgitation.  5. The tricuspid valve is abnormal. Tricuspid valve regurgitation is moderate to severe.  6. The aortic valve is tricuspid. Aortic valve regurgitation is not visualized. No aortic stenosis is present.  7. Evidence of atrial level shunting detected by color flow Doppler.  8. Left atrial size was severely dilated. No left atrial/left atrial appendage thrombus was detected. FINDINGS  Left Ventricle: Left ventricular ejection fraction, by estimation, is 25 to 30%. The left ventricle has severely decreased function. The left ventricle demonstrates global hypokinesis. The left ventricular internal cavity size was severely dilated. There is mild left ventricular hypertrophy. Right Ventricle: The right ventricular size is mildly enlarged. Right vetricular wall thickness was not assessed. Right ventricular systolic function is mildly reduced. Left Atrium: Left atrial size was severely dilated. No left atrial/left atrial appendage thrombus was detected. Right Atrium: Right atrial size was mildly dilated. Pericardium: Trivial pericardial effusion is present. Mitral Valve: The mitral valve is abnormal. Moderate to severe mitral valve regurgitation. Tricuspid Valve: The tricuspid valve is  abnormal. Tricuspid valve regurgitation is moderate to severe. Aortic Valve: The aortic valve is tricuspid. Aortic valve regurgitation is not visualized. No aortic stenosis is present. Pulmonic Valve: The pulmonic valve was grossly normal. Pulmonic valve regurgitation is trivial. Aorta: The aortic root is normal in size and structure. IAS/Shunts: Evidence of atrial level shunting detected by color flow Doppler.  MR Peak grad:    95.6 mmHg MR Mean grad:    50.0 mmHg MR Vmax:         489.00 cm/s MR Vmean:        324.0 cm/s MR PISA:         2.26 cm MR PISA Eff ROA: 18 mm MR PISA Radius:  0.60 cm Oswaldo Milian MD Electronically signed by Oswaldo Milian MD Signature Date/Time: 11/17/2019/3:11:02 PM  Final     Labs: BMET Recent Labs  Lab 11/26/2019 1331 11/26/19 0457 11/27/19 0429 11/19/2019 0232 11/29/19 0541 11/30/19 0310 11/30/19 0845  NA 132* 132* 132* 127* 131* 133* 133*  K 5.3* 5.2* 4.8 4.4 2.9* 2.6* 2.8*  CL 93* 94* 94* 91* 90* 87* 84*  CO2 21* 23 24 20* 21* 29 30  GLUCOSE 153* 103* 97 194* 178* 111* 187*  BUN 71* 78* 81* 83* 78* 66* 64*  CREATININE 2.74* 3.26* 3.36* 3.47* 2.86* 2.07* 2.17*  CALCIUM 9.7 10.1 10.2 9.5 9.2 9.6 9.7   CBC Recent Labs  Lab 11/27/19 0429 12/07/2019 0232 11/29/19 0541 11/30/19 0310  WBC 6.0 7.5 5.9 6.6  HGB 12.1 12.4 12.3 12.5  HCT 38.8 39.7 40.0 39.8  MCV 85.3 85.7 88.3 85.0  PLT 270 321 257 268    Medications:    . apixaban  5 mg Oral BID  . Chlorhexidine Gluconate Cloth  6 each Topical Daily  . furosemide  80 mg Intravenous Q8H  . insulin aspart  0-9 Units Subcutaneous TID WC  . insulin degludec  34 Units Subcutaneous Q2000  . multivitamin with minerals  1 tablet Oral Daily  . potassium chloride SA  40 mEq Oral BID  . potassium chloride  40 mEq Oral TID AC & HS  . potassium chloride  40 mEq Oral Once  . sodium chloride flush  3 mL Intravenous Once  . sodium chloride flush  3 mL Intravenous Q12H      Otelia Santee,  MD 11/30/2019, 10:20 AM

## 2019-11-30 NOTE — Progress Notes (Addendum)
Progress Note  Patient Name: Sara Walker Date of Encounter: 11/30/2019  Primary Cardiologist: Sanda Klein, MD   Subjective   Frustrated about multiple IV sticks.  Overall feeling better.  Happy that she is able to lay nearly flat for the first time in months.  Inpatient Medications    Scheduled Meds: . apixaban  5 mg Oral BID  . Chlorhexidine Gluconate Cloth  6 each Topical Daily  . furosemide  80 mg Intravenous Q8H  . insulin aspart  0-9 Units Subcutaneous TID WC  . insulin degludec  34 Units Subcutaneous Q2000  . multivitamin with minerals  1 tablet Oral Daily  . potassium chloride SA  40 mEq Oral BID  . potassium chloride  40 mEq Oral TID AC & HS  . sodium chloride flush  3 mL Intravenous Once  . sodium chloride flush  3 mL Intravenous Q12H   Continuous Infusions: . sodium chloride    . sodium chloride 100 mL/hr at 11/23/2019 1310  . amiodarone 30 mg/hr (11/30/19 0700)  . norepinephrine (LEVOPHED) Adult infusion 2 mcg/min (11/30/19 0700)  . potassium chloride     PRN Meds: sodium chloride, acetaminophen, ALPRAZolam, diphenhydrAMINE, hydrocortisone cream, ondansetron (ZOFRAN) IV, sodium chloride flush, zolpidem   Vital Signs    Vitals:   11/30/19 0400 11/30/19 0500 11/30/19 0630 11/30/19 0700  BP: 101/67 (!) 87/60  (!) 115/98  Pulse:      Resp: (!) 22 16  (!) 21  Temp: 97.8 F (36.6 C)  97.9 F (36.6 C)   TempSrc: Oral  Oral   SpO2:  97%  94%  Weight:  112.4 kg    Height:        Intake/Output Summary (Last 24 hours) at 11/30/2019 0838 Last data filed at 11/30/2019 0745 Gross per 24 hour  Intake 1456.94 ml  Output 2450 ml  Net -993.06 ml   Last 3 Weights 11/30/2019 11/29/2019 11/27/2019  Weight (lbs) 247 lb 12.8 oz 253 lb 1.4 oz 255 lb 4.7 oz  Weight (kg) 112.4 kg 114.8 kg 115.8 kg      Telemetry    V paced- Personally Reviewed  ECG    n/a - Personally Reviewed  Physical Exam   VS:  BP (!) 84/51   Pulse 80   Temp 97.9 F (36.6 C) (Oral)    Resp (!) 21   Ht 5\' 8"  (1.727 m)   Wt 112.4 kg   SpO2 92%   BMI 37.68 kg/m  , BMI Body mass index is 37.68 kg/m. GENERAL:  Chronically ill-appearing HEENT: Pupils equal round and reactive, fundi not visualized, oral mucosa unremarkable NECK:  Unable to assess JVP LUNGS: Mild basilar crackles HEART:  RRR.  PMI not displaced or sustained,S1 and S2 within normal limits, no S3, no S4, no clicks, no rubs, no murmurs ABD:  Flat, positive bowel sounds normal in frequency in pitch, no bruits, no rebound, no guarding, no midline pulsatile mass, no hepatomegaly, no splenomegaly EXT:  2+ LE edema NEURO:  Cranial nerves II through XII grossly intact, motor grossly intact throughout PSYCH:  Cognitively intact, oriented to person place and time   Labs    High Sensitivity Troponin:   Recent Labs  Lab 11/18/2019 1331 12/09/2019 1517  TROPONINIHS 43* 43*      Chemistry Recent Labs  Lab 11/09/2019 1331 11/26/19 0457 11/27/2019 0232 11/29/19 0541 11/30/19 0310  NA 132*   < > 127* 131* 133*  K 5.3*   < > 4.4 2.9* 2.6*  CL 93*   < > 91* 90* 87*  CO2 21*   < > 20* 21* 29  GLUCOSE 153*   < > 194* 178* 111*  BUN 71*   < > 83* 78* 66*  CREATININE 2.74*   < > 3.47* 2.86* 2.07*  CALCIUM 9.7   < > 9.5 9.2 9.6  PROT 5.7*  --   --   --   --   ALBUMIN 3.7  --   --   --   --   AST 21  --   --   --   --   ALT 36  --   --   --   --   ALKPHOS 74  --   --   --   --   BILITOT 3.1*  --   --   --   --   GFRNONAA 17*   < > 13* 16* 24*  GFRAA 20*   < > 15* 19* 28*  ANIONGAP 18*   < > 16* 20* 17*   < > = values in this interval not displayed.     Hematology Recent Labs  Lab 11/15/2019 0232 11/29/19 0541 11/30/19 0310  WBC 7.5 5.9 6.6  RBC 4.63 4.53 4.68  HGB 12.4 12.3 12.5  HCT 39.7 40.0 39.8  MCV 85.7 88.3 85.0  MCH 26.8 27.2 26.7  MCHC 31.2 30.8 31.4  RDW 17.9* 17.9* 17.8*  PLT 321 257 268    BNP Recent Labs  Lab 12/03/2019 1331  BNP 1,081.7*     DDimer No results for input(s): DDIMER  in the last 168 hours.   Radiology    ECHO TEE  Result Date: 11/27/2019    TRANSESOPHOGEAL ECHO REPORT   Patient Name:   Sara Walker Date of Exam: 12/08/2019 Medical Rec #:  [id number]      Height:       68.0 in Accession #:    [id number]     Weight:       255.3 lb Date of Birth:  01/30/1951      BSA:          2.267 m Patient Age:    69 years       BP:           119/73 mmHg Patient Gender: F              HR:           77 bpm. Exam Location:  Inpatient Procedure: Transesophageal Echo, Color Doppler and Cardiac Doppler Indications:     I48.91* Unspecified atrial fibrillation  History:         Patient has prior history of Echocardiogram examinations, most                  recent 09/20/2019. CHF, Pacemaker and Defibrillator,                  Arrythmias:Atrial Fibrillation and LBBB; Risk                  Factors:Hypertension, Diabetes and Dyslipidemia.  Sonographer:     Raquel Sarna Senior RDCS Referring Phys:  [id number] [place] Diagnosing Phys: Oswaldo Milian MD PROCEDURE: After discussion of the risks and benefits of a TEE, an informed consent was obtained from the patient. The transesophogeal probe was passed without difficulty through the esophogus of the patient. Local oropharyngeal anesthetic was provided with Cetacaine. Sedation performed by different physician. The patient was monitored while  under deep sedation. Anesthestetic sedation was provided intravenously by Anesthesiology: 161mg  of Propofol. The patient developed no complications during the procedure. An unsuccessful direct current cardioversion was performed with 3 attempts. IMPRESSIONS  1. Left ventricular ejection fraction, by estimation, is 25 to 30%. The left ventricle has severely decreased function. The left ventricle demonstrates global hypokinesis. The left ventricular internal cavity size was severely dilated. There is mild left ventricular hypertrophy.  2. Right ventricular systolic function is mildly reduced. The right ventricular  size is mildly enlarged.  3. Right atrial size was mildly dilated.  4. The mitral valve is abnormal. Moderate to severe mitral valve regurgitation.  5. The tricuspid valve is abnormal. Tricuspid valve regurgitation is moderate to severe.  6. The aortic valve is tricuspid. Aortic valve regurgitation is not visualized. No aortic stenosis is present.  7. Evidence of atrial level shunting detected by color flow Doppler.  8. Left atrial size was severely dilated. No left atrial/left atrial appendage thrombus was detected. FINDINGS  Left Ventricle: Left ventricular ejection fraction, by estimation, is 25 to 30%. The left ventricle has severely decreased function. The left ventricle demonstrates global hypokinesis. The left ventricular internal cavity size was severely dilated. There is mild left ventricular hypertrophy. Right Ventricle: The right ventricular size is mildly enlarged. Right vetricular wall thickness was not assessed. Right ventricular systolic function is mildly reduced. Left Atrium: Left atrial size was severely dilated. No left atrial/left atrial appendage thrombus was detected. Right Atrium: Right atrial size was mildly dilated. Pericardium: Trivial pericardial effusion is present. Mitral Valve: The mitral valve is abnormal. Moderate to severe mitral valve regurgitation. Tricuspid Valve: The tricuspid valve is abnormal. Tricuspid valve regurgitation is moderate to severe. Aortic Valve: The aortic valve is tricuspid. Aortic valve regurgitation is not visualized. No aortic stenosis is present. Pulmonic Valve: The pulmonic valve was grossly normal. Pulmonic valve regurgitation is trivial. Aorta: The aortic root is normal in size and structure. IAS/Shunts: Evidence of atrial level shunting detected by color flow Doppler.  MR Peak grad:    95.6 mmHg MR Mean grad:    50.0 mmHg MR Vmax:         489.00 cm/s MR Vmean:        324.0 cm/s MR PISA:         2.26 cm MR PISA Eff ROA: 18 mm MR PISA Radius:  0.60 cm  Oswaldo Milian MD Electronically signed by Oswaldo Milian MD Signature Date/Time: 11/18/2019/3:11:02 PM    Final     Cardiac Studies   TEE 11/22/2019: 1. Left ventricular ejection fraction, by estimation, is 25 to 30%. The  left ventricle has severely decreased function. The left ventricle  demonstrates global hypokinesis. The left ventricular internal cavity size  was severely dilated. There is mild  left ventricular hypertrophy.  2. Right ventricular systolic function is mildly reduced. The right  ventricular size is mildly enlarged.  3. Right atrial size was mildly dilated.  4. The mitral valve is abnormal. Moderate to severe mitral valve  regurgitation.  5. The tricuspid valve is abnormal. Tricuspid valve regurgitation is  moderate to severe.  6. The aortic valve is tricuspid. Aortic valve regurgitation is not  visualized. No aortic stenosis is present.  7. Evidence of atrial level shunting detected by color flow Doppler.  8. Left atrial size was severely dilated. No left atrial/left atrial  appendage thrombus was detected.   Patient Profile     69 y.o. female with nonischemic cardiomyopathy, chronic systolic diastolic heart failure (  LVEF 25 to 30%) status post CRT-D, persistent atrial fibrillation, polymorphic VT status post ICD shocks, LBBB, hypertension, hyperlipidemia, diabetes, CKD 4, anxiety, and depression admitted with acute on chronic heart failure.  Assessment & Plan    # Acute on chronic systolic diastolic heart failure: # Cardiorenal syndrome: # Acute on chronic renal failure, stage IV: # Hyponatremia:  # Hypokalemia:  Diuresis has been challenging.  She had worsening renal function with IV Lasix.  Nephrology has been following.  Renal function worsened and she was hyponatremic despite adding metolazone.  She has declined CRRT.  Fortunately she is doing well with Levophed and IV Lasix.  Renal function is improving and sodium is improving.  We will  continue with the current regimen.  Discussed with nursing staff not to attempt weaning her Levophed if she needs it for improved renal perfusion.  She still has at least 20 pounds of fluid to lose.  Aggressively replete potassium, as she has a history of VT and ICD firings in the setting of hypokalemia.  Carvedilol on hold 2/2 cardiogenic shock and Levophed use.   # Persistent atrial fibrillation: Attempt at TEE and cardioversion 09/2019 was aborted due to left atrial appendage thrombus.  Cardioversion was unsuccessful in 11/2018.  She was unable to hold atrial pacing for more than 5 to 10 seconds.  They also attempted overdrive pacing unsuccessfully.  Continue amiodarone and Eliquis.   Time spent: 45 minutes-Greater than 50% of this time was spent in counseling, explanation of diagnosis, planning of further management, and coordination of care.   For questions or updates, please contact [place] Please consult [email or web address] for contact info under        Signed, Skeet Latch, MD  11/30/2019, 8:38 AM

## 2019-12-01 ENCOUNTER — Inpatient Hospital Stay (HOSPITAL_COMMUNITY): Payer: Medicare Other | Admitting: Anesthesiology

## 2019-12-01 ENCOUNTER — Inpatient Hospital Stay (HOSPITAL_COMMUNITY): Payer: Medicare Other

## 2019-12-01 LAB — POCT I-STAT 7, (LYTES, BLD GAS, ICA,H+H)
Acid-Base Excess: 6 mmol/L — ABNORMAL HIGH (ref 0.0–2.0)
Acid-base deficit: 3 mmol/L — ABNORMAL HIGH (ref 0.0–2.0)
Bicarbonate: 21.6 mmol/L (ref 20.0–28.0)
Bicarbonate: 32.8 mmol/L — ABNORMAL HIGH (ref 20.0–28.0)
Calcium, Ion: 1.14 mmol/L — ABNORMAL LOW (ref 1.15–1.40)
Calcium, Ion: 1.48 mmol/L — ABNORMAL HIGH (ref 1.15–1.40)
HCT: 49 % — ABNORMAL HIGH (ref 36.0–46.0)
HCT: 43 % (ref 36.0–46.0)
Hemoglobin: 16.7 g/dL — ABNORMAL HIGH (ref 12.0–15.0)
Hemoglobin: 14.6 g/dL (ref 12.0–15.0)
O2 Saturation: 72 %
O2 Saturation: 100 %
Patient temperature: 98.7
Patient temperature: 98.7
Potassium: 6.9 mmol/L (ref 3.5–5.1)
Potassium: 5 mmol/L (ref 3.5–5.1)
Sodium: 127 mmol/L — ABNORMAL LOW (ref 135–145)
Sodium: 134 mmol/L — ABNORMAL LOW (ref 135–145)
TCO2: 23 mmol/L (ref 22–32)
TCO2: 34 mmol/L — ABNORMAL HIGH (ref 22–32)
pCO2 arterial: 35.5 mmHg (ref 32.0–48.0)
pCO2 arterial: 53 mmHg — ABNORMAL HIGH (ref 32.0–48.0)
pH, Arterial: 7.392 (ref 7.350–7.450)
pH, Arterial: 7.4 (ref 7.350–7.450)
pO2, Arterial: 38 mmHg — CL (ref 83.0–108.0)
pO2, Arterial: 308 mmHg — ABNORMAL HIGH (ref 83.0–108.0)

## 2019-12-01 LAB — LACTIC ACID, PLASMA: Lactic Acid, Venous: 7.9 mmol/L (ref 0.5–1.9)

## 2019-12-01 LAB — GLUCOSE, CAPILLARY: Glucose-Capillary: 175 mg/dL — ABNORMAL HIGH (ref 70–99)

## 2019-12-01 LAB — CBC
HCT: 48.5 % — ABNORMAL HIGH (ref 36.0–46.0)
Hemoglobin: 14.3 g/dL (ref 12.0–15.0)
MCH: 26.5 pg (ref 26.0–34.0)
MCHC: 29.5 g/dL — ABNORMAL LOW (ref 30.0–36.0)
MCV: 90 fL (ref 80.0–100.0)
Platelets: 291 10*3/uL (ref 150–400)
RBC: 5.39 MIL/uL — ABNORMAL HIGH (ref 3.87–5.11)
RDW: 18.6 % — ABNORMAL HIGH (ref 11.5–15.5)
WBC: 8.6 10*3/uL (ref 4.0–10.5)
nRBC: 1.6 % — ABNORMAL HIGH (ref 0.0–0.2)

## 2019-12-01 LAB — BASIC METABOLIC PANEL
Anion gap: 16 — ABNORMAL HIGH (ref 5–15)
Anion gap: 16 — ABNORMAL HIGH (ref 5–15)
BUN: 65 mg/dL — ABNORMAL HIGH (ref 8–23)
BUN: 63 mg/dL — ABNORMAL HIGH (ref 8–23)
CO2: 19 mmol/L — ABNORMAL LOW (ref 22–32)
CO2: 24 mmol/L (ref 22–32)
Calcium: 9.6 mg/dL (ref 8.9–10.3)
Calcium: 11.8 mg/dL — ABNORMAL HIGH (ref 8.9–10.3)
Chloride: 93 mmol/L — ABNORMAL LOW (ref 98–111)
Chloride: 96 mmol/L — ABNORMAL LOW (ref 98–111)
Creatinine, Ser: 2.64 mg/dL — ABNORMAL HIGH (ref 0.44–1.00)
Creatinine, Ser: 2.31 mg/dL — ABNORMAL HIGH (ref 0.44–1.00)
GFR calc Af Amer: 21 mL/min — ABNORMAL LOW (ref >60)
GFR calc Af Amer: 24 mL/min — ABNORMAL LOW (ref >60)
GFR calc non Af Amer: 18 mL/min — ABNORMAL LOW (ref >60)
GFR calc non Af Amer: 21 mL/min — ABNORMAL LOW (ref >60)
Glucose, Bld: 212 mg/dL — ABNORMAL HIGH (ref 70–99)
Glucose, Bld: 230 mg/dL — ABNORMAL HIGH (ref 70–99)
Potassium: 7.4 mmol/L (ref 3.5–5.1)
Potassium: 5.3 mmol/L — ABNORMAL HIGH (ref 3.5–5.1)
Sodium: 128 mmol/L — ABNORMAL LOW (ref 135–145)
Sodium: 136 mmol/L (ref 135–145)

## 2019-12-01 MED ORDER — GLYCOPYRROLATE 0.2 MG/ML IJ SOLN: 0.2000 mg | INTRAMUSCULAR | Status: DC | PRN

## 2019-12-01 MED ORDER — CALCIUM CHLORIDE 10 % IV SOLN
INTRAVENOUS | Status: AC
  Administered 2019-12-01: 1000 mg
  Filled 2019-12-01: qty 10

## 2019-12-01 MED ORDER — FENTANYL CITRATE (PF) 100 MCG/2ML IJ SOLN
INTRAMUSCULAR | Status: AC
  Administered 2019-12-01: 100 ug
  Filled 2019-12-01: qty 2

## 2019-12-01 MED ORDER — MAGNESIUM SULFATE 2 GM/50ML IV SOLN
INTRAVENOUS | Status: AC
  Administered 2019-12-01: 2 g
  Filled 2019-12-01: qty 50

## 2019-12-01 MED ORDER — SODIUM BICARBONATE 8.4 % IV SOLN
INTRAVENOUS | Status: AC
  Administered 2019-12-01: 50 meq via INTRAVENOUS
  Filled 2019-12-01: qty 50

## 2019-12-01 MED ORDER — ACETAMINOPHEN 650 MG RE SUPP: 650.0000 mg | Freq: Four times a day (QID) | RECTAL | Status: DC | PRN

## 2019-12-01 MED ORDER — ACETAMINOPHEN 325 MG PO TABS: 650.0000 mg | ORAL_TABLET | Freq: Four times a day (QID) | ORAL | Status: DC | PRN

## 2019-12-01 MED ORDER — MIDAZOLAM HCL 2 MG/2ML IJ SOLN
INTRAMUSCULAR | Status: AC
  Administered 2019-12-01: 2 mg via INTRAVENOUS
  Filled 2019-12-01: qty 2

## 2019-12-01 MED ORDER — INSULIN ASPART 100 UNIT/ML IV SOLN: 10.0000 [IU] | Freq: Once | INTRAVENOUS | Status: DC

## 2019-12-01 MED ORDER — SODIUM CHLORIDE 0.9 % IV SOLN
1.0000 g | Freq: Once | INTRAVENOUS | Status: DC
  Filled 2019-12-01: qty 10

## 2019-12-01 MED ORDER — ROCURONIUM BROMIDE 100 MG/10ML IV SOLN
INTRAVENOUS | Status: DC | PRN
  Administered 2019-12-01: 100 mg via INTRAVENOUS

## 2019-12-01 MED ORDER — SODIUM BICARBONATE 8.4 % IV SOLN: 50.0000 meq | INTRAVENOUS | Status: AC

## 2019-12-01 MED ORDER — MORPHINE 100MG IN NS 100ML (1MG/ML) PREMIX INFUSION
0.5000 mg/h | INTRAVENOUS | Status: DC
  Administered 2019-12-01: 5 mg/h via INTRAVENOUS
  Filled 2019-12-01: qty 100

## 2019-12-01 MED ORDER — CALCIUM CHLORIDE 10 % IV SOLN
INTRAVENOUS | Status: AC
  Filled 2019-12-01: qty 10

## 2019-12-01 MED ORDER — DEXTROSE 50 % IV SOLN
INTRAVENOUS | Status: AC
  Administered 2019-12-01: 50 mL via INTRAVENOUS
  Filled 2019-12-01: qty 50

## 2019-12-01 MED ORDER — DEXTROSE 50 % IV SOLN: 1.0000 | Freq: Once | INTRAVENOUS | Status: AC

## 2019-12-01 MED ORDER — FUROSEMIDE 10 MG/ML IJ SOLN
INTRAMUSCULAR | Status: AC
  Administered 2019-12-01: 80 mg via INTRAVENOUS
  Filled 2019-12-01: qty 12

## 2019-12-01 MED ORDER — GLYCOPYRROLATE 1 MG PO TABS: 1.0000 mg | ORAL_TABLET | ORAL | Status: DC | PRN

## 2019-12-01 MED ORDER — ETOMIDATE 2 MG/ML IV SOLN
INTRAVENOUS | Status: DC | PRN
  Administered 2019-12-01: 18 mg via INTRAVENOUS

## 2019-12-01 MED ORDER — DEXTROSE 5 % IV SOLN: INTRAVENOUS | Status: DC

## 2019-12-01 MED ORDER — POLYVINYL ALCOHOL 1.4 % OP SOLN: 1.0000 [drp] | Freq: Four times a day (QID) | OPHTHALMIC | Status: DC | PRN

## 2019-12-01 MED ORDER — DOBUTAMINE IN D5W 4-5 MG/ML-% IV SOLN
INTRAVENOUS | Status: AC
  Administered 2019-12-01: 5 mg
  Filled 2019-12-01: qty 250

## 2019-12-01 MED ORDER — LIDOCAINE HCL (CARDIAC) PF 100 MG/5ML IV SOSY: 1.0000 mg/kg | PREFILLED_SYRINGE | INTRAVENOUS | Status: DC

## 2019-12-01 MED ORDER — SODIUM BICARBONATE 8.4 % IV SOLN: 50.0000 meq | Freq: Once | INTRAVENOUS | Status: AC

## 2019-12-01 MED ORDER — MIDAZOLAM HCL 2 MG/2ML IJ SOLN: 2.0000 mg | Freq: Once | INTRAMUSCULAR | Status: AC

## 2019-12-01 MED ORDER — DOBUTAMINE IN D5W 4-5 MG/ML-% IV SOLN
2.5000 ug/kg/min | INTRAVENOUS | Status: DC
  Administered 2019-12-01: 5 ug/kg/min via INTRAVENOUS

## 2019-12-01 MED ORDER — FENTANYL CITRATE (PF) 100 MCG/2ML IJ SOLN
50.0000 ug | Freq: Once | INTRAMUSCULAR | Status: AC
  Administered 2019-12-01: 50 ug via INTRAVENOUS

## 2019-12-02 ENCOUNTER — Encounter: Payer: Self-pay | Admitting: *Deleted

## 2019-12-04 ENCOUNTER — Telehealth: Payer: Self-pay

## 2019-12-04 NOTE — Telephone Encounter (Signed)
The pt daughter called to let me know the pt died 2019/12/22. I verified the pt address and let her know I will order a return kit for the monitor. I told her I was so sorry for her loss.   Return kit order and will take 10 business days to get to her.

## 2019-12-06 ENCOUNTER — Telehealth: Payer: Self-pay | Admitting: Cardiovascular Disease

## 2019-12-06 ENCOUNTER — Telehealth: Payer: Self-pay

## 2019-12-06 DIAGNOSIS — E875 Hyperkalemia: Secondary | ICD-10-CM

## 2019-12-06 DIAGNOSIS — R34 Anuria and oliguria: Secondary | ICD-10-CM

## 2019-12-06 NOTE — Telephone Encounter (Signed)
Message routed to medical records, primary cardiologist and primary nurse. However, patient passed in hospital.

## 2019-12-06 NOTE — Telephone Encounter (Signed)
Melanie from [facility] calling stating she will be faxing a death certificate to be signed by Dr. Sallyanne Kuster for cremation. She states it was also sent to the [facility] office yesterday by mistake. She would like to know if it is received.

## 2019-12-06 NOTE — Telephone Encounter (Signed)
Death certificate received via fax for Dr. Meda Coffee from [facility] for cremation. Placed in Dr. Francesca Oman box for signature. 12/06/19 vlm

## 2019-12-09 NOTE — Telephone Encounter (Signed)
Is there any way you can fax it to the hospital? Or should I go to the clinic to sign? I can do it tomorrow, we just have 5 TAVR cases in the hospital so it would be more convenient to do it from here. Thank you, KN

## 2019-12-10 NOTE — Progress Notes (Signed)
CDS notified of impending death. Will forward to Donor rep to determine if potential donor. Ref # [id number]

## 2019-12-10 NOTE — Progress Notes (Signed)
CRITICAL VALUE ALERT  Critical Value: Abnormal 12 lead EKG  Date & Time Notied:  T 0200  Provider Notified: Dr Suezanne Jacquet Cards fellow  Orders Received/Actions taken: Will come by to see Pt

## 2019-12-10 NOTE — Progress Notes (Addendum)
CODE STATUS DISCUSSION  The patient's son- Myriam Jacobson and the daughter, Truman Hayward came to the room.  ExIplained to them regarding patient's critical condition, severe cardiogenic shock, Incesssant VT, pulm edema on vent and Hyperkalemia They are well aware of her critical condition and based on the discussion with the patient previously, she does not want any life support measures.  Repeat blood work showed K 5.0 after treatment. She still has Wide complex rhythm on the telemetry- VT with a pulse, BP 108/7mmhg on levo 83mcg, dob 21mcg, amiod drip running, lido was ordered   Patient repeatedly going into incessant VT   -> We will transition her to comfort care  -> Put a magnet to deactive ICD shocks. Will contact [employer] for CRT- D (turn off ICD)  -> once she is on comfort care (morphine), she will be extubated  -> Family at bedside

## 2019-12-10 NOTE — Procedures (Signed)
Intubation Procedure Note SHERYLE VICE [id number] [birth year]  Procedure: Intubation Indications: Respiratory insufficiency  Procedure Details Consent: Unable to obtain consent because of emergent medical necessity. Time Out: Verified patient identification, verified procedure, site/side was marked, verified correct patient position, special equipment/implants available, medications/allergies/relevent history reviewed, required imaging and test results available.  Performed  Maximum sterile technique was used including gloves, hand hygiene and mask.  MAC and 3    Evaluation Hemodynamic Status: BP stable throughout; O2 sats: currently acceptable Patient's Current Condition: stable Complications: No apparent complications Patient did tolerate procedure well. Chest X-ray ordered to verify placement.  CXR: pending.   Roby Lofts Jansel Vonstein 12/21/19

## 2019-12-10 NOTE — Death Summary Note (Signed)
Death Summary    Patient ID: Sara Walker,  MRN: [id number], DOB/AGE: [birth year] 69 y.o.  Admit date: 12/02/2019 Discharge date: 12/06/2019  Primary Care Provider: Deland Walker Primary Cardiologist: Dr. [name]  Discharge Diagnoses    Principal Problem:   Atrial flutter [facility]) Active Problems:   Acute on chronic combined systolic and diastolic CHF (congestive heart failure) (HCC)   Nonischemic cardiomyopathy (HCC)   AKI (acute kidney injury) ([place])   Hypokalemia   VT (ventricular tachycardia) (HCC)   Pacemaker   Hyperkalemia   Oligouria   Allergies Allergies  Allergen Reactions  . Lasix [Furosemide] Other (See Comments)    Causes gout  . Ace Inhibitors Cough  . Codeine Other (See Comments)    Crazy  . Covid-19 (Mrna) Vaccine (Moderna) [Covid-19 (Mrna) Vaccine]     Shortness of breath  . Digitalis Other (See Comments)    "saw yellow circles"  . Latex Other (See Comments)    Break out.  Sara Walker [Levofloxacin In D5w] Other (See Comments)    Gout   . Other Other (See Comments)    Nitro spray: reaction unknown  . Statins Other (See Comments)    Reaction unknown  . Xarelto [Rivaroxaban]   . Zyrtec [Cetirizine] Other (See Comments)    Slept for a day then was up for 4 days afterwards    Diagnostic Studies/Procedures    TEE/DCCV _____________   History of Present Illness     Sara Walker is a 69 y.o. female with pmh of chronic combined CHF, NICM with EF 25-30% s/p CRT-D, PAF on Eliquis, ICD shocks for polymorphic VT in 2016 in the setting of hypokalemia, LBBB, HTN, HLD, DM2 on insulin pump, CKD sage IV, h/o of uterine bleeding with IUD, anxiety and depression who was seen for shortness of breath and aflutter.    [name] is followed by Dr. [name]. Nonischemic cardiomyopathy felt to be secondary to chemotherapy. She has been intolerant to many medications, including ACEi, ARB, Entresto and statin. Despite CRT-D therapy EF has remained low. The  office was alerted of atrial tachycardia from 3/5-3/7 and the patient was admitted for afib RVR and acute heart failure. Echo showed EF 20-30% with global hypokinesis and septal lateral dyssynergy consistent with loss of resynchronization pacing. She was treated with IV lasix and kidney function worsened and nephrology was consulted who recommended Torsemide and metolazone. She had TEE 09/20/19 for DCCV but was unable to perform due to early forming thrombus in the left atrial appendage. Eliquis was started and she was loaded with amiodarone therapy. Discharge weight was 250lbs. She was discharged with torsemide 50 mg daily. She was seen 11/08/19 and had reported weight gain that resolved with metolazone and torsemide. She saw nephrology May 7th and torsemide was doubled to 100 mg daily and spiro 12.5mg  daily started. This initially this improved weight status but after the second week she noted a 5 lb weight gain. Scheduled PPM transmission the day of admission showed Aflutter that had been ongoing since early April, BiV pacing 78% of the time. Patient was contacted who reported she had generally feeling horrible and had a 4.5lbs weight gain and difficulty sleeping due to frequent urination and sob. ED eval recommended ED evaluation.   The patient came to the ED 11/14/2019 for generally feeling poorly, sob, and chest pain. Said sob on exertion has been chronic, since her admission in March, and felt this never improved (despite being in sinus on the last PPM  transmission). Also reported intermittent chest tightness that she felt was directly related to volume overload. She said she had intermittent diarrhea. No fever or chills. Also had orthopnea at night and abdominal swelling that was worse at night. She felt her appetite was down due to the severe abdominal swelling.   In the ED BP 85/61, pulse 87, RR 24, afebrile, 100% O2, weight 252 lbs. Edema noted on exam. EKG showed Afib with BiV paced rhythm at 86 bpm.  Labs show potassium 5.3, creatinine 2.74. WBC 6.7, Hgb 12.9, BNP 1,081. Hs trop 43>43. CXR showed persistent left retrocardiac atelectasis/consolidation. COVID pending. She was admitted to cardiology for further management.   Hospital Course     Consultants: Nephrology, PCCM  She was admitted and started on IV Lasix, but unfortunately had no diuresis and had increasing creatinine levels.  Her Lasix was held and nephrology was consulted for help.  She remained in atrial fibrillation/flutter with controlled rates.  Given she had previously underwent TEE with planned cardioversion however this was aborted due to a thrombus on her left atrial appendage decision was made to involve EP as she had been on amiodarone to determine long-term goal for her atrial fibrillation.  She was seen by nephrology with recommendations to increase Lasix to 120 mg IV every 8 hours and add metolazone as needed.  Plan was to follow response and consider CRRT if she did not respond well to Lasix.  Unfortunately she had poor response to IV Lasix dosing and the option of CRRT was discussed with her and her family but it was noted she may not come off of renal replacement therapy after beginning.  Stated she did not want to be on HD long-term.  It was noted that she was experiencing cardiorenal syndrome, and recommendation given for palliative care consult.  She developed persistent hypotension and her BP medications were held.  She was ultimately started on Levophed.  Consideration was given to placing a central line to monitor coox but this is complicated as she had breast surgery removing lymph nodes on the right side and ICD placed on the left.  She wished not to have a PICC line placed.  She did begin to have some response to Lasix but looks great.  Decision was made by Dr. Meda Coffee to send for a TEE/DCCV in which she converted to an atrial paced, ventricular paced rhythm for approximately 10 seconds and then converted back into atrial  flutter with ventricular pacing.  Overdrive pacing was attempted but she remained in atrial flutter.  After failed attempt at cardioversion she was seen by Dr. Caryl Comes on 12/08/2019.  There were attempts made to pace terminate her atrial flutter/fib.  There was some AA variability so it was difficult to tell but they were able to interpret as a flutter.  They tried to induce A. fib which would only hold for several seconds at a time before converting into flutter.  Decision was made to reprogram her device at 90 bpm as to overdrive her intrinsic conduction and try to maximize resynchronization therapy.  She did have some improvement in her creatinine, but then unfortunately began to rise again.  Decision was made to continue attempts at diuresis.  Did have hypokalemia with plans to aggressively replete K+.  Unfortunately she developed severe hyperkalemia with a potassium level of 7.4.  The morning of 5/23 overnight cardiology was paged regarding patient transitioning into wide-complex rhythm.  She had been becoming progressively oliguric and noted to have anasarca.  On arrival the patient was noted to be in respiratory distress, hypotensive and cool extremities.  She was given an IV bolus of amiodarone of 150mg , started on a lidocaine drip with bolus, given 1 amp of calcium and started on dobutamine.  PCCM was consulted for further management.  She was intubated by anesthesia at the bedside and her Levophed titrated.  Given her rapid decline overnight cardiology MD discussed with her son over the phone regarding her condition.  They reported the patient requested to be a DNR and did not want to be resuscitated nor on a ventilator or undergo dialysis or ICD shocks.  It was noted in the chart that she is a full code.  Decision was made by the family to continue current efforts until they arrived.  On family's arrival the patient was transitioned to comfort care and was extubated.  Her ICD was deactivated with  magnet.  Shortly after extubation the patient expired.   _____________  Danley Danker Weights   11/29/19 0500 11/30/19 0500 12-29-19 0500  Weight: 114.8 kg 112.4 kg 112.4 kg    Labs & Radiologic Studies    CBC No results for input(s): WBC, NEUTROABS, HGB, HCT, MCV, PLT in the last 72 hours. Basic Metabolic Panel No results for input(s): NA, K, CL, CO2, GLUCOSE, BUN, CREATININE, CALCIUM, MG, PHOS in the last 72 hours. Liver Function Tests No results for input(s): AST, ALT, ALKPHOS, BILITOT, PROT, ALBUMIN in the last 72 hours. No results for input(s): LIPASE, AMYLASE in the last 72 hours. Cardiac Enzymes No results for input(s): CKTOTAL, CKMB, CKMBINDEX, TROPONINI in the last 72 hours. BNP Invalid input(s): POCBNP D-Dimer No results for input(s): DDIMER in the last 72 hours. Hemoglobin A1C No results for input(s): HGBA1C in the last 72 hours. Fasting Lipid Panel No results for input(s): CHOL, HDL, LDLCALC, TRIG, CHOLHDL, LDLDIRECT in the last 72 hours. Thyroid Function Tests No results for input(s): TSH, T4TOTAL, T3FREE, THYROIDAB in the last 72 hours.  Invalid input(s): FREET3 _____________  DG CHEST PORT 1 VIEW  Result Date: 29-Dec-2019 CLINICAL DATA:  Initial evaluation for intubation. EXAM: PORTABLE CHEST 1 VIEW COMPARISON:  Prior radiograph from 11/24/2019. FINDINGS: Endotracheal tube in place with tip positioned 4.4 cm above the carina. Stable cardiomegaly. Mediastinal silhouette within normal limits. Left-sided pacemaker/AICD noted. Lungs hypoinflated. Small layering left pleural effusion noted. Mild perihilar vascular congestion without overt pulmonary edema. No focal infiltrates. No pneumothorax. Osseous structures are unchanged. IMPRESSION: 1. Tip of the endotracheal tube 4.4 cm above the carina. 2. Small layering left pleural effusion. 3. Stable cardiomegaly with mild perihilar vascular congestion without overt pulmonary edema. Electronically Signed   By: Jeannine Boga M.D.    On: 2019/12/29 03:56   DG Chest Port 1 View  Result Date: 11/15/2019 CLINICAL DATA:  CP ,SOB. HX of pacemaker. EXAM: PORTABLE CHEST - 1 VIEW COMPARISON:  09/30/2019 FINDINGS: Left retrocardiac atelectasis/consolidation obscuring the left diaphragmatic leaflet. Right lung clear. Leftward deviation of the trachea secondary to right paratracheal mass probably goiter as previously described. Stable cardiomegaly. Stable left subclavian AICD. Can not exclude small left effusion. No pneumothorax. Surgical clips right axilla. IMPRESSION: Persistent left retrocardiac atelectasis/consolidation. Electronically Signed   By: Lucrezia Europe M.D.   On: 11/29/2019 13:40   ECHO TEE  Result Date: 11/09/2019    TRANSESOPHOGEAL ECHO REPORT   Patient Name:   CHESNEY SUARES Date of Exam: 11/21/2019 Medical Rec #:  [id number]      Height:  68.0 in Accession #:    [id number]     Weight:       255.3 lb Date of Birth:  [birth year]      BSA:          2.267 m Patient Age:    60 years       BP:           119/73 mmHg Patient Gender: F              HR:           77 bpm. Exam Location:  Inpatient Procedure: Transesophageal Echo, Color Doppler and Cardiac Doppler Indications:     I48.91* Unspecified atrial fibrillation  History:         Patient has prior history of Echocardiogram examinations, most                  recent 09/20/2019. CHF, Pacemaker and Defibrillator,                  Arrythmias:Atrial Fibrillation and LBBB; Risk                  Factors:Hypertension, Diabetes and Dyslipidemia.  Sonographer:     Raquel Sarna Senior RDCS Referring Phys:  [id number] [place] Diagnosing Phys: Oswaldo Milian MD PROCEDURE: After discussion of the risks and benefits of a TEE, an informed consent was obtained from the patient. The transesophogeal probe was passed without difficulty through the esophogus of the patient. Local oropharyngeal anesthetic was provided with Cetacaine. Sedation performed by different physician. The patient was monitored  while under deep sedation. Anesthestetic sedation was provided intravenously by Anesthesiology: 161mg  of Propofol. The patient developed no complications during the procedure. An unsuccessful direct current cardioversion was performed with 3 attempts. IMPRESSIONS  1. Left ventricular ejection fraction, by estimation, is 25 to 30%. The left ventricle has severely decreased function. The left ventricle demonstrates global hypokinesis. The left ventricular internal cavity size was severely dilated. There is mild left ventricular hypertrophy.  2. Right ventricular systolic function is mildly reduced. The right ventricular size is mildly enlarged.  3. Right atrial size was mildly dilated.  4. The mitral valve is abnormal. Moderate to severe mitral valve regurgitation.  5. The tricuspid valve is abnormal. Tricuspid valve regurgitation is moderate to severe.  6. The aortic valve is tricuspid. Aortic valve regurgitation is not visualized. No aortic stenosis is present.  7. Evidence of atrial level shunting detected by color flow Doppler.  8. Left atrial size was severely dilated. No left atrial/left atrial appendage thrombus was detected. FINDINGS  Left Ventricle: Left ventricular ejection fraction, by estimation, is 25 to 30%. The left ventricle has severely decreased function. The left ventricle demonstrates global hypokinesis. The left ventricular internal cavity size was severely dilated. There is mild left ventricular hypertrophy. Right Ventricle: The right ventricular size is mildly enlarged. Right vetricular wall thickness was not assessed. Right ventricular systolic function is mildly reduced. Left Atrium: Left atrial size was severely dilated. No left atrial/left atrial appendage thrombus was detected. Right Atrium: Right atrial size was mildly dilated. Pericardium: Trivial pericardial effusion is present. Mitral Valve: The mitral valve is abnormal. Moderate to severe mitral valve regurgitation. Tricuspid Valve:  The tricuspid valve is abnormal. Tricuspid valve regurgitation is moderate to severe. Aortic Valve: The aortic valve is tricuspid. Aortic valve regurgitation is not visualized. No aortic stenosis is present. Pulmonic Valve: The pulmonic valve was grossly normal. Pulmonic valve regurgitation is trivial. Aorta: The aortic  root is normal in size and structure. IAS/Shunts: Evidence of atrial level shunting detected by color flow Doppler.  MR Peak grad:    95.6 mmHg MR Mean grad:    50.0 mmHg MR Vmax:         489.00 cm/s MR Vmean:        324.0 cm/s MR PISA:         2.26 cm MR PISA Eff ROA: 18 mm MR PISA Radius:  0.60 cm Oswaldo Milian MD Electronically signed by Oswaldo Milian MD Signature Date/Time: 11/09/2019/3:11:02 PM    Final    CUP PACEART REMOTE DEVICE CHECK  Result Date: 11/17/2019 Scheduled remote reviewed. Normal device function.  1653 AMS (69%). AFL ongoing; persistent since early April. Takes Eliquis. Bi-v pacing 78%. RV CapConfirm in high output mode; threshold reported as "unknown." Routing to triage. Next remote 91 days. Felisa Bonier, RN, MSN  Disposition   Pt expired   Duration of Discharge Encounter   Greater than 30 minutes including physician time.  Signed, Reino Bellis NP-C 12/06/2019, 4:32 PM

## 2019-12-10 NOTE — Procedures (Signed)
Extubation Procedure Note  Patient Details:   Name: Sara Walker DOB: [birth year] MRN: [id number]   Airway Documentation:    Vent end date: 12/06/2019 Vent end time: 0518   Evaluation  O2 sats: stable throughout Complications: No apparent complications Patient did tolerate procedure well. Bilateral Breath Sounds: Diminished   No  Pt comfort care, compassionate extubation to RA.  Roby Lofts Harvis Mabus 2019-12-06, 5:25 AM

## 2019-12-10 NOTE — Anesthesia Procedure Notes (Signed)
Procedure Name: Intubation Date/Time: 12-16-19 3:05 AM Performed by: Suzy Bouchard, CRNA Pre-anesthesia Checklist: Patient identified, Emergency Drugs available, Suction available, Patient being monitored and Timeout performed Patient Re-evaluated:Patient Re-evaluated prior to induction Oxygen Delivery Method: Ambu bag Preoxygenation: Pre-oxygenation with 100% oxygen Induction Type: IV induction and Rapid sequence Laryngoscope Size: Glidescope and 3 Grade View: Grade I Tube type: Oral Tube size: 7.5 mm Number of attempts: 1 Airway Equipment and Method: Stylet and Video-laryngoscopy Placement Confirmation: ETT inserted through vocal cords under direct vision,  positive ETCO2 and breath sounds checked- equal and bilateral Secured at: 23 cm Tube secured with: Tape Dental Injury: Teeth and Oropharynx as per pre-operative assessment

## 2019-12-10 NOTE — Anesthesia Postprocedure Evaluation (Signed)
Anesthesia Post Note  Patient: Sara Walker  Procedure(s) Performed: TRANSESOPHAGEAL ECHOCARDIOGRAM (TEE) (N/A ) CARDIOVERSION (N/A )     Patient location during evaluation: Endoscopy Anesthesia Type: MAC Level of consciousness: awake and alert Pain management: pain level controlled Vital Signs Assessment: post-procedure vital signs reviewed and stable Respiratory status: spontaneous breathing, nonlabored ventilation, respiratory function stable and patient connected to nasal cannula oxygen Cardiovascular status: stable and blood pressure returned to baseline Postop Assessment: no apparent nausea or vomiting Anesthetic complications: no    Last Vitals:  Vitals:   12-25-2019 0318 12-25-2019 0500  BP: 98/73   Pulse: (!) 106 (!) 124  Resp: 16 15  Temp: 36.9 C   SpO2: 98% 100%    Last Pain:  Vitals:   2019-12-25 0318  TempSrc: Oral  PainSc:                  [place]

## 2019-12-10 NOTE — Progress Notes (Addendum)
Cardiology EVENT NOTE  Called by the RN regarding change in her EKG/Telemetry- wide complex rhythm. Patient has baseline LBBB and has a CRT-D  Briefly, 69 y.o. female with nonischemic cardiomyopathy, chronic systolic diastolic heart failure (LVEF 25 to 30%) status post CRT-D, persistent atrial fibrillation, polymorphic VT status post ICD shocks, LBBB, hypertension, hyperlipidemia, diabetes, CKD 4, anxiety, and depression admitted with acute on chronic heart failure.  She has been getting progressively oliguric, volume overload/anasarca, poor response to diuretics and worsening pulmonary edema. RN reports patient has been nauseous all day/night   On arrival, patient was in respiratory distress, blue lips, BP 93/51mm Hg,  Cold legs. EKG shows wide complex rhythm, pacing spikes varying  Interventions:  - stat IV amiodarone 150mg  bolus  - stat IV lidocaine 100mg  bolus  - stat labs, ABG, LA  - transferred her from chair to the bed  - started high flow oxygen ABG and bedside electrolytes showed K 7.4, ABG: pO2 38   - 1 amp of calcium chloride given  - 2 peripheral IVs obtained  - started dobutamine 68mcg/kg/min  - Agitated, restrains in place  Called Stat Intensivist Called Stat anesthesia for intubation  Patient listed as a FULL CODE   - Anesthesia intubated the patient at bedside  - 3 or 4 peripheral IVs in place  - Levophed increased to 46mcg/kg/min, dobutamine infusing at 21mcg/kg/min BP 70/40 with MAP 50s improved to 90/50s  - still cold, blue lips,  Her rhythm improved (HR came down) after 3 amps of calcium chloride, 1 amp of bicarb, insulin + dextrose.  - continue amiodarone gtt  Impression  - Sine wave from Hyperkalemia (K 7.4)  - severe Cardiogenic shock  - acute pulmonary edema, anasarca- needing emergent intubation  - AKI on CKD  - VT/VT  - Non ischemic CMP, LVEF 25-30% (s/p CRT-D)  - Persistent Afib.   - h/o polymorphic VT  Plan:   - I spoke to the son, Arbutus Ped  and the daughter. They report that Ms Schreurs is a DNR. She does not want to be resuscitated, does not want ventilator or dialysis or shocks  - I told them that she was listed as a FULL CODE and we had to emergently intubated her and support her.   - Currently, the patient HR is down to 91bpm, intermittently V paced and has baseline LBBB  - we will continue current efforts until the family arrives  - we will talk with them and if they wish to proceed with comfort measures, we will honor that given critically ill patient  Labs:  ABG: 7.39/ Pco2 35, pO2 38 Na 128, K 7.4, Co2 19,  AG 16, Cr 2.64 Lactic acid 7.9   - after confirming again with the family, son (HCPOA), I will change the code to DNR.  Critical care time spent > 115 mins.

## 2019-12-10 NOTE — Telephone Encounter (Signed)
Sara Walker with [facility] is calling to follow up in regards to death certificate that has been faxed to the office. She states she is requesting to have the certificate signed by Dr. [name] and returned today, 12/10/19 in order to arrange the patient's funeral. A call may be returned to discuss at [phone].

## 2019-12-10 DEATH — deceased

## 2019-12-11 NOTE — Telephone Encounter (Signed)
Dr. Meda Coffee, I have the death certificate in your folder on your cart to sign. Just received it out of your mailbox this morning. Please advise when you can sign!

## 2019-12-11 NOTE — Telephone Encounter (Signed)
Gave Medical Records this pts death certificate information.  Per Medical Records, the original copy has not been received yet, and per Administrator Crystal, they will be mailing the original death certificate for further signature to our office.  This was mailed out on 6/1.  Per Medical Records, when they received the original death certificate form, they will fax this to the pts Primary Cardiologist, Dr. Sallyanne Kuster,  to further review and sign.  Medical records has the copy of the death certificate along with the folder containing other information, and will await for the original form, so that they can fax this and have this signed by Dr. Sallyanne Kuster at our [place] office.

## 2019-12-11 NOTE — Telephone Encounter (Signed)
Received original death certificate form in Dr. Francesca Oman mailbox, and placed in folder in Medical Records dept to further follow-up on obtaining an official signature from pts Primary Cardiologist.

## 2019-12-12 NOTE — Telephone Encounter (Signed)
Original death certificate received by our office late yesterday and given to Medical Records to have Dr. Sallyanne Kuster sign.  Per Administrator, being the copied death certificate was faxed to Dr. Meda Coffee at the hospital to sign, she will leave the original certificate in her folder to further sign and give back to Medical Records, upon return to the office tomorrow.  Original death certificate form is now located in Dr. Francesca Oman folder to sign on 12/13/19.

## 2019-12-12 NOTE — Telephone Encounter (Signed)
Janett Billow in Medical Records has the pts original death certificate form and will be faxing this over to our [place] office today for further signature from Dr. Sallyanne Kuster

## 2019-12-12 NOTE — Telephone Encounter (Signed)
Spoke with [employer]. She received a signed death certificate from Dr. Meda Coffee for this patient. No further action needed.

## 2019-12-12 NOTE — Telephone Encounter (Signed)
Original death certificate received by our office late yesterday and given to Medical Records to have Dr. Sallyanne Kuster sign.  Per Administrator, being the copied death certificate was faxed to Dr. Meda Coffee at the hospital to sign, she will leave the original certificate in her folder to further sign and give back to Medical Records, upon return to the office tomorrow.  Original death certificate form is now located in Dr. Francesca Oman folder to sign on 12/13/19.   See previous telephone note from today from our [place] office with further details about this.

## 2019-12-12 NOTE — Telephone Encounter (Signed)
Sounds like it has been taken care of. Thanks.

## 2019-12-16 ENCOUNTER — Encounter: Payer: Medicare Other | Admitting: Cardiovascular Disease

## 2019-12-18 NOTE — Telephone Encounter (Signed)
Will send this message to our Medical Records to further follow-up with the funeral home about the death certificate.  Dr. Meda Coffee signed the original copy and this was hand delivered to Medical Records to further follow-up and send documentation to the appropriate party, thereafter.

## 2019-12-18 NOTE — Telephone Encounter (Signed)
Follow up    [facility] is calling to follow up on the death certificate.  She says the Health department has not yet received it    Please advise

## 2019-12-18 NOTE — Telephone Encounter (Signed)
Just confirmed with Lorriane Shire in Medical Records, that she did call [facility] home today, and informed them that our medical records dept did send the pts original death certificate that was signed by Dr. Meda Coffee, in the mail last Friday.  Lorriane Shire states this was mailed and they will be receiving this soon.

## 2020-12-23 IMAGING — DX DG CHEST 2V
2 series · 2 of 2 positions shown · non-contrast
Comparison: 09/17/2019

CLINICAL DATA: Chest pain

EXAM:
CHEST - 2 VIEW

[chest lat]
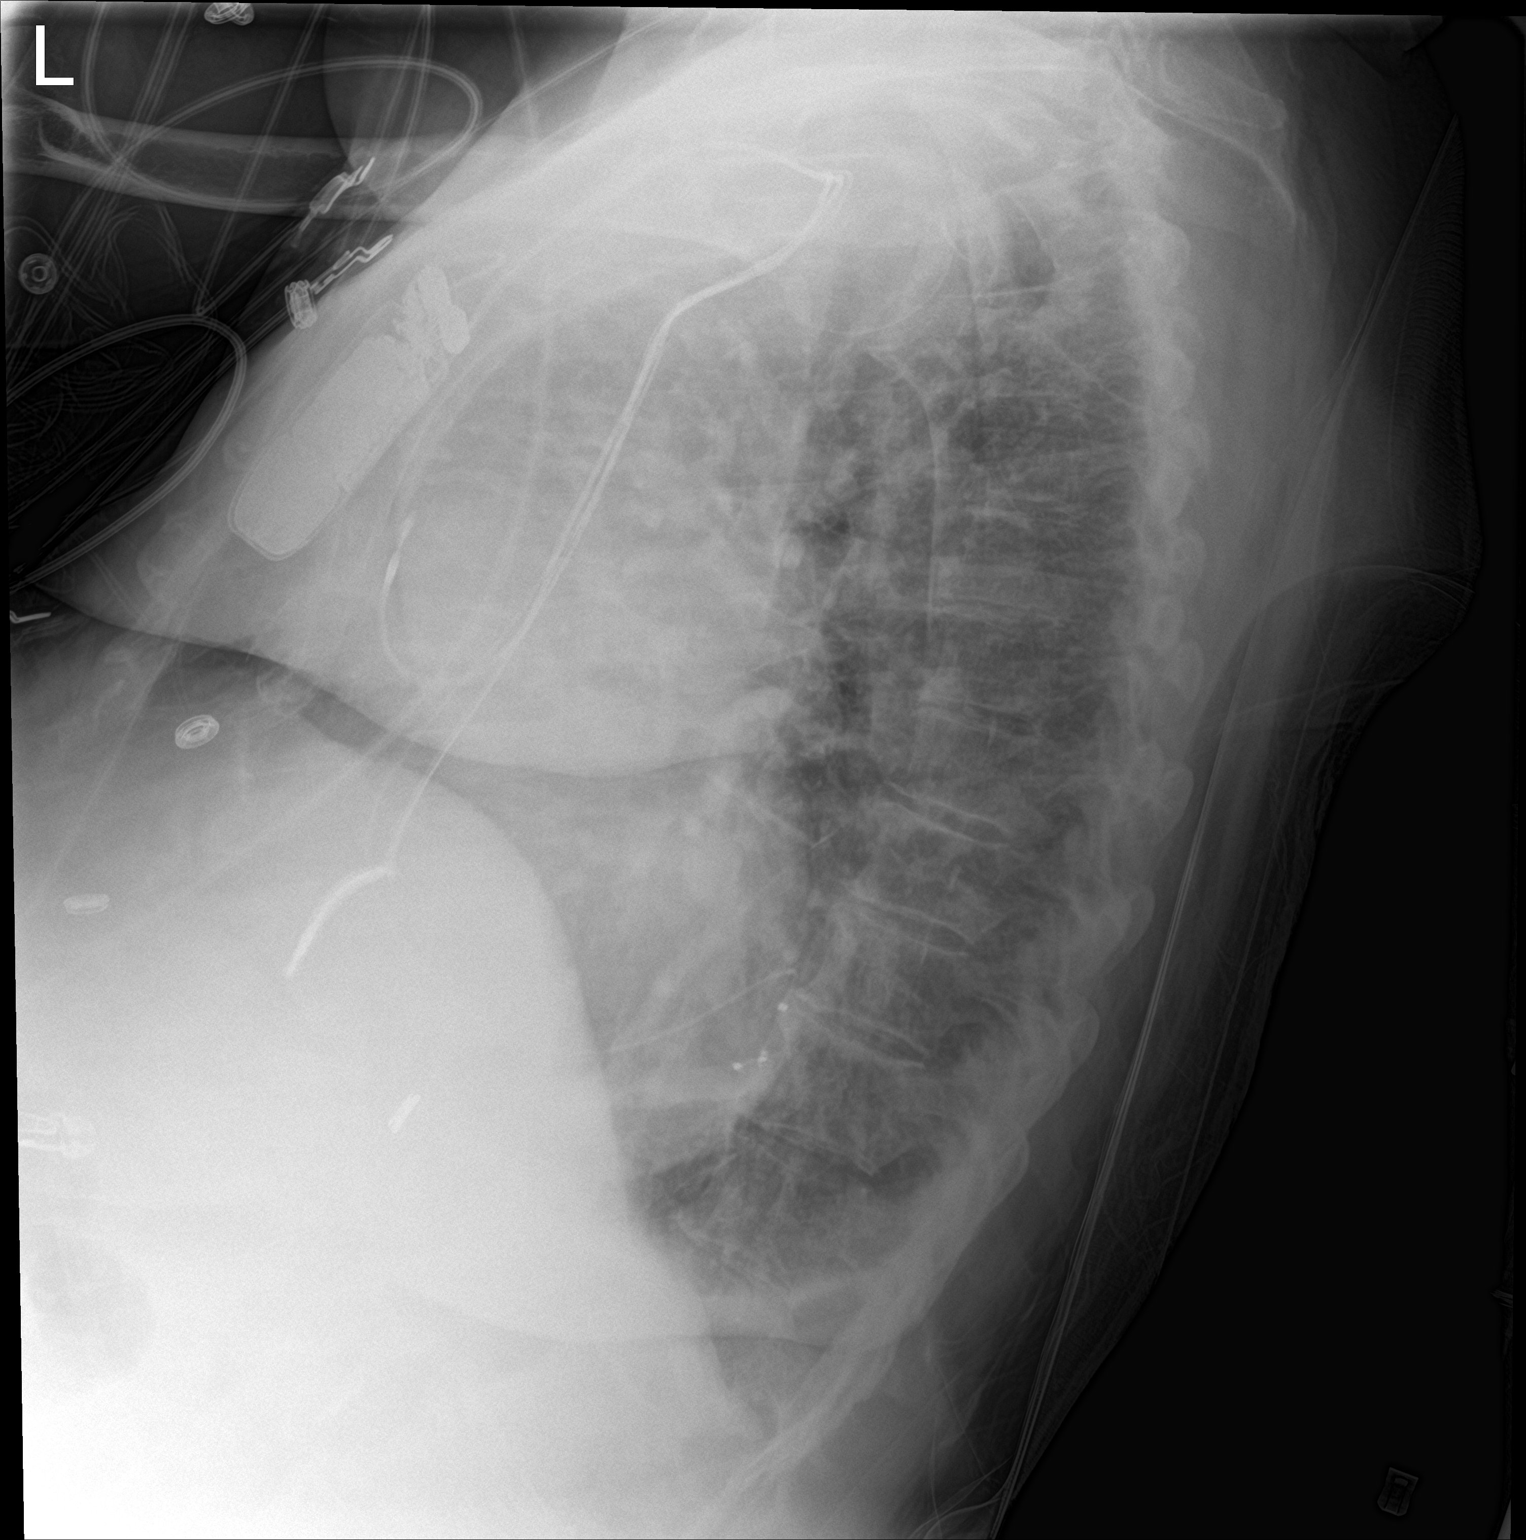

[chest ap]
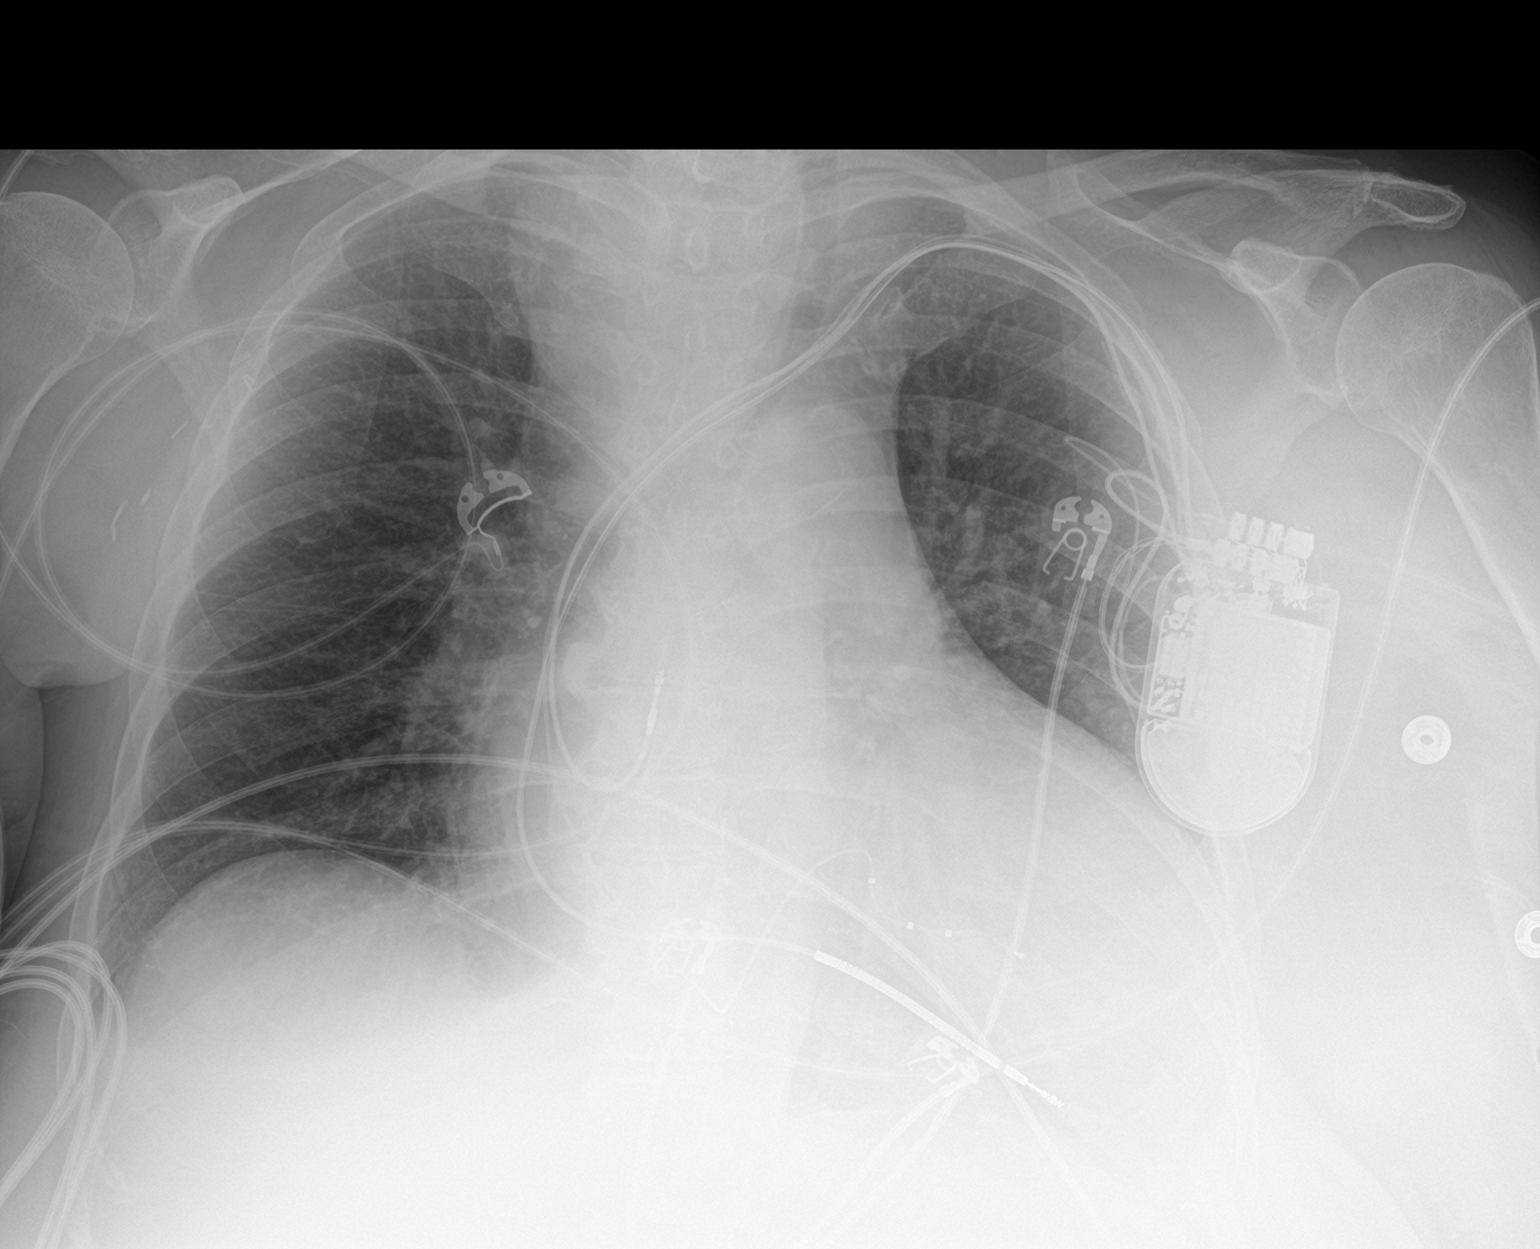

[2 of 2 positions shown; findings below may reference images not displayed]

FINDINGS: Cardiomegaly with biventricular pacer/ICD.

Postoperative right breast and axilla.

Large superior mediastinal mass that correlates with a large thyroid
nodule by 5779 ultrasound. The trachea is chronically deviated to
the left without apparent narrowing.
IMPRESSION: 1. Cardiomegaly and vascular congestion.
2. Known large right thyroid nodule with tracheal displacement.

## 2021-02-17 IMAGING — DX DG CHEST 1V PORT
1 series · 1 of 1 positions shown · non-contrast
Comparison: 09/30/2019

CLINICAL DATA: CP ,SOB. HX of pacemaker.

EXAM:
PORTABLE CHEST - 1 VIEW

[chest]
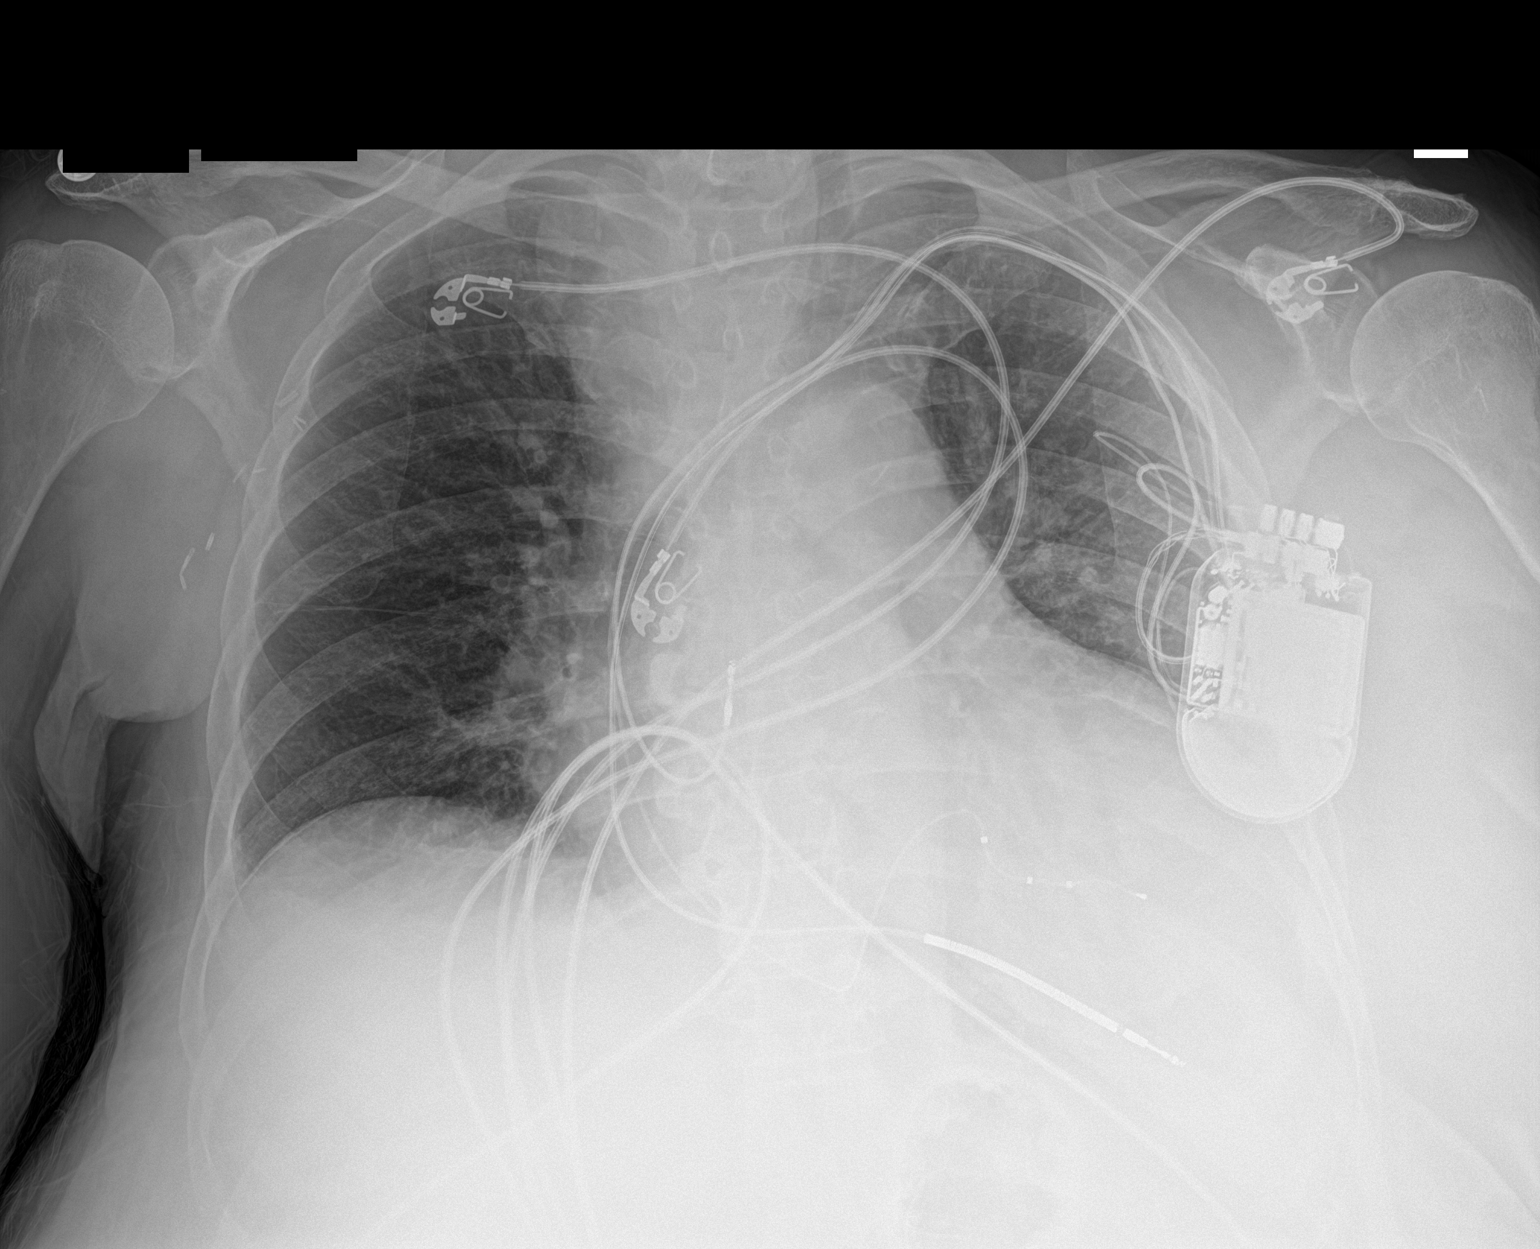

[1 of 1 positions shown; findings below may reference images not displayed]

FINDINGS: Left retrocardiac atelectasis/consolidation obscuring the left
diaphragmatic leaflet. Right lung clear. Leftward deviation of the
trachea secondary to right paratracheal mass probably goiter as
previously described.

Stable cardiomegaly. Stable left subclavian AICD.

Can not exclude small left effusion. No pneumothorax.

Surgical clips right axilla.
IMPRESSION: Persistent left retrocardiac atelectasis/consolidation.
